# Patient Record
Sex: Female | Born: 1960
Health system: Southern US, Community
[De-identification: ages and names within clinical notes are randomized; demographics above are authoritative.]

## PROBLEM LIST (undated history)

## (undated) ENCOUNTER — Emergency Department (HOSPITAL_COMMUNITY): Discharge status: Against Medical Advice | Payer: Self-pay

## (undated) DIAGNOSIS — R110 Nausea: Secondary | ICD-10-CM

## (undated) DIAGNOSIS — E876 Hypokalemia: Secondary | ICD-10-CM

## (undated) DIAGNOSIS — R1310 Dysphagia, unspecified: Secondary | ICD-10-CM

## (undated) DIAGNOSIS — M545 Low back pain, unspecified: Secondary | ICD-10-CM

## (undated) DIAGNOSIS — I351 Nonrheumatic aortic (valve) insufficiency: Secondary | ICD-10-CM

## (undated) DIAGNOSIS — I509 Heart failure, unspecified: Secondary | ICD-10-CM

## (undated) DIAGNOSIS — G4700 Insomnia, unspecified: Secondary | ICD-10-CM

## (undated) DIAGNOSIS — Z9851 Tubal ligation status: Secondary | ICD-10-CM

## (undated) DIAGNOSIS — T7411XA Adult physical abuse, confirmed, initial encounter: Secondary | ICD-10-CM

## (undated) DIAGNOSIS — R0602 Shortness of breath: Secondary | ICD-10-CM

## (undated) DIAGNOSIS — N921 Excessive and frequent menstruation with irregular cycle: Secondary | ICD-10-CM

## (undated) DIAGNOSIS — K5900 Constipation, unspecified: Secondary | ICD-10-CM

## (undated) DIAGNOSIS — J101 Influenza due to other identified influenza virus with other respiratory manifestations: Secondary | ICD-10-CM

## (undated) DIAGNOSIS — M542 Cervicalgia: Secondary | ICD-10-CM

## (undated) DIAGNOSIS — R079 Chest pain, unspecified: Secondary | ICD-10-CM

## (undated) DIAGNOSIS — D219 Benign neoplasm of connective and other soft tissue, unspecified: Secondary | ICD-10-CM

## (undated) DIAGNOSIS — M2540 Effusion, unspecified joint: Secondary | ICD-10-CM

## (undated) DIAGNOSIS — N393 Stress incontinence (female) (male): Secondary | ICD-10-CM

## (undated) DIAGNOSIS — R42 Dizziness and giddiness: Secondary | ICD-10-CM

## (undated) DIAGNOSIS — G971 Other reaction to spinal and lumbar puncture: Secondary | ICD-10-CM

## (undated) DIAGNOSIS — G43109 Migraine with aura, not intractable, without status migrainosus: Secondary | ICD-10-CM

## (undated) DIAGNOSIS — F32A Depression, unspecified: Secondary | ICD-10-CM

## (undated) DIAGNOSIS — F419 Anxiety disorder, unspecified: Secondary | ICD-10-CM

## (undated) DIAGNOSIS — R920 Mammographic microcalcification found on diagnostic imaging of breast: Secondary | ICD-10-CM

## (undated) DIAGNOSIS — D649 Anemia, unspecified: Secondary | ICD-10-CM

## (undated) DIAGNOSIS — R531 Weakness: Secondary | ICD-10-CM

## (undated) DIAGNOSIS — M4802 Spinal stenosis, cervical region: Secondary | ICD-10-CM

## (undated) DIAGNOSIS — R600 Localized edema: Secondary | ICD-10-CM

## (undated) DIAGNOSIS — L52 Erythema nodosum: Secondary | ICD-10-CM

## (undated) DIAGNOSIS — N920 Excessive and frequent menstruation with regular cycle: Secondary | ICD-10-CM

## (undated) DIAGNOSIS — G5600 Carpal tunnel syndrome, unspecified upper limb: Secondary | ICD-10-CM

## (undated) DIAGNOSIS — F329 Major depressive disorder, single episode, unspecified: Secondary | ICD-10-CM

## (undated) DIAGNOSIS — J45909 Unspecified asthma, uncomplicated: Secondary | ICD-10-CM

## (undated) DIAGNOSIS — K649 Unspecified hemorrhoids: Secondary | ICD-10-CM

## (undated) DIAGNOSIS — I10 Essential (primary) hypertension: Secondary | ICD-10-CM

## (undated) HISTORY — DX: Excessive and frequent menstruation with regular cycle: N92.0

## (undated) HISTORY — DX: Tubal ligation status: Z98.51

## (undated) HISTORY — PX: MYOMECTOMY: SHX85

## (undated) HISTORY — DX: Benign neoplasm of connective and other soft tissue, unspecified: D21.9

## (undated) HISTORY — DX: Hypokalemia: E87.6

## (undated) HISTORY — DX: Carpal tunnel syndrome, unspecified upper limb: G56.00

## (undated) HISTORY — DX: Anemia, unspecified: D64.9

## (undated) HISTORY — DX: Adult physical abuse, confirmed, initial encounter: T74.11XA

## (undated) HISTORY — DX: Influenza due to other identified influenza virus with other respiratory manifestations: J10.1

## (undated) HISTORY — DX: Erythema nodosum: L52

## (undated) HISTORY — DX: Excessive and frequent menstruation with irregular cycle: N92.1

## (undated) HISTORY — DX: Spinal stenosis, cervical region: M48.02

## (undated) HISTORY — DX: Migraine with aura, not intractable, without status migrainosus: G43.109

## (undated) HISTORY — PX: LASER ABLATION/CAUTERIZATION OF ENDOMETRIAL IMPLANTS: SHX1951

## (undated) HISTORY — PX: TUBAL LIGATION: SHX77

## (undated) HISTORY — DX: Mammographic microcalcification found on diagnostic imaging of breast: R92.0

---

## 2011-03-13 ENCOUNTER — Emergency Department (HOSPITAL_COMMUNITY)
Admission: EM | Admit: 2011-03-13 | Discharge: 2011-03-14 | Disposition: A | Discharge status: Home / Self Care | Payer: No Typology Code available for payment source | Attending: Emergency Medicine | Admitting: Emergency Medicine

## 2011-03-13 DIAGNOSIS — I10 Essential (primary) hypertension: Secondary | ICD-10-CM | POA: Insufficient documentation

## 2011-03-13 DIAGNOSIS — Z79899 Other long term (current) drug therapy: Secondary | ICD-10-CM | POA: Insufficient documentation

## 2011-03-13 DIAGNOSIS — S239XXA Sprain of unspecified parts of thorax, initial encounter: Secondary | ICD-10-CM | POA: Insufficient documentation

## 2011-03-13 DIAGNOSIS — M5380 Other specified dorsopathies, site unspecified: Secondary | ICD-10-CM | POA: Insufficient documentation

## 2011-03-13 DIAGNOSIS — M546 Pain in thoracic spine: Secondary | ICD-10-CM | POA: Insufficient documentation

## 2011-09-02 ENCOUNTER — Emergency Department (HOSPITAL_COMMUNITY)
Admission: EM | Admit: 2011-09-02 | Discharge: 2011-09-02 | Disposition: A | Discharge status: Home / Self Care | Payer: Self-pay | Attending: Emergency Medicine | Admitting: Emergency Medicine

## 2011-09-02 ENCOUNTER — Emergency Department (HOSPITAL_COMMUNITY): Payer: Self-pay

## 2011-09-02 DIAGNOSIS — I10 Essential (primary) hypertension: Secondary | ICD-10-CM | POA: Insufficient documentation

## 2011-09-02 DIAGNOSIS — R059 Cough, unspecified: Secondary | ICD-10-CM | POA: Insufficient documentation

## 2011-09-02 DIAGNOSIS — J40 Bronchitis, not specified as acute or chronic: Secondary | ICD-10-CM | POA: Insufficient documentation

## 2011-09-02 DIAGNOSIS — R071 Chest pain on breathing: Secondary | ICD-10-CM | POA: Insufficient documentation

## 2011-09-02 DIAGNOSIS — R05 Cough: Secondary | ICD-10-CM | POA: Insufficient documentation

## 2011-09-02 LAB — COMPREHENSIVE METABOLIC PANEL
AST: 20 U/L (ref 0–37)
Albumin: 3.6 g/dL (ref 3.5–5.2)
Calcium: 8.9 mg/dL (ref 8.4–10.5)
Chloride: 102 mEq/L (ref 96–112)
Creatinine, Ser: 0.84 mg/dL (ref 0.50–1.10)
Total Bilirubin: 0.2 mg/dL — ABNORMAL LOW (ref 0.3–1.2)
Total Protein: 7.3 g/dL (ref 6.0–8.3)

## 2011-09-02 LAB — CBC
MCV: 86.7 fL (ref 78.0–100.0)
Platelets: 256 10*3/uL (ref 150–400)
RDW: 12.8 % (ref 11.5–15.5)
WBC: 3.5 10*3/uL — ABNORMAL LOW (ref 4.0–10.5)

## 2011-10-16 ENCOUNTER — Emergency Department (HOSPITAL_COMMUNITY)
Admission: EM | Admit: 2011-10-16 | Discharge: 2011-10-16 | Disposition: A | Discharge status: Home / Self Care | Payer: Self-pay | Attending: Emergency Medicine | Admitting: Emergency Medicine

## 2011-10-16 DIAGNOSIS — I10 Essential (primary) hypertension: Secondary | ICD-10-CM | POA: Insufficient documentation

## 2011-10-16 DIAGNOSIS — L02419 Cutaneous abscess of limb, unspecified: Secondary | ICD-10-CM | POA: Insufficient documentation

## 2011-12-19 DIAGNOSIS — R55 Syncope and collapse: Secondary | ICD-10-CM

## 2011-12-19 DIAGNOSIS — R079 Chest pain, unspecified: Secondary | ICD-10-CM

## 2011-12-19 HISTORY — DX: Chest pain, unspecified: R07.9

## 2011-12-19 HISTORY — DX: Syncope and collapse: R55

## 2011-12-22 ENCOUNTER — Emergency Department (INDEPENDENT_AMBULATORY_CARE_PROVIDER_SITE_OTHER)
Admission: EM | Admit: 2011-12-22 | Discharge: 2011-12-22 | Disposition: A | Discharge status: Home / Self Care | Payer: Self-pay | Source: Home / Self Care | Attending: Family Medicine | Admitting: Family Medicine

## 2011-12-22 DIAGNOSIS — N390 Urinary tract infection, site not specified: Secondary | ICD-10-CM

## 2011-12-22 DIAGNOSIS — A499 Bacterial infection, unspecified: Secondary | ICD-10-CM

## 2011-12-22 DIAGNOSIS — N760 Acute vaginitis: Secondary | ICD-10-CM

## 2011-12-22 DIAGNOSIS — B9689 Other specified bacterial agents as the cause of diseases classified elsewhere: Secondary | ICD-10-CM

## 2011-12-22 HISTORY — DX: Essential (primary) hypertension: I10

## 2011-12-22 LAB — WET PREP, GENITAL
Clue Cells Wet Prep HPF POC: NONE SEEN
Yeast Wet Prep HPF POC: NONE SEEN

## 2011-12-22 LAB — POCT PREGNANCY, URINE: Preg Test, Ur: NEGATIVE

## 2011-12-22 LAB — POCT URINALYSIS DIP (DEVICE)
Bilirubin Urine: NEGATIVE
Ketones, ur: NEGATIVE mg/dL
Specific Gravity, Urine: >=1.03 (ref 1.005–1.030)
pH: 6 (ref 5.0–8.0)

## 2011-12-22 MED ORDER — FLUCONAZOLE 150 MG PO TABS: 150.0000 mg | ORAL_TABLET | Freq: Once | ORAL | Status: AC

## 2011-12-22 MED ORDER — METRONIDAZOLE 500 MG PO TABS: 500.0000 mg | ORAL_TABLET | Freq: Two times a day (BID) | ORAL | Status: AC

## 2011-12-22 MED ORDER — LISINOPRIL 20 MG PO TABS: 20.0000 mg | ORAL_TABLET | Freq: Every day | ORAL | Status: DC

## 2011-12-22 MED ORDER — CEPHALEXIN 500 MG PO CAPS: 500.0000 mg | ORAL_CAPSULE | Freq: Two times a day (BID) | ORAL | Status: AC

## 2011-12-22 NOTE — ED Notes (Signed)
Reports she has missed a period and is concerned about poss pregnancy, as well as poss STD's ; reports UA incontinance, UA odor, frquency; had laser surgery for fibroids

## 2011-12-22 NOTE — ED Provider Notes (Signed)
History     CSN: [id number]  Arrival date & time 12/22/11  1733   First MD Initiated Contact with Patient 12/22/11 1806      Chief Complaint  Patient presents with  . Urinary Tract Infection    (Consider location/radiation/quality/duration/timing/severity/associated sxs/prior treatment) HPI Comments: Concerned about missed period, questions about menopause, reports vaginal discharge, malodorous, dysuria, frequency, urgency, but small amount that comes out when she urinates. She has a new partner, but found out previous partner was unfaithful and now requests STD screening, including HIV and RPR Patient is a 51 y.o. female presenting with female genitourinary complaint. The history is provided by the patient.  Female GU Problem Primary symptoms include dysuria.  Primary symptoms include no discharge, no dyspareunia, no genital lesions, no genital rash, no genital itching, no genital odor and no vaginal bleeding. There has been no fever. She has missed her period. Her LMP was weeks ago. Associated symptoms include frequency.    Past Medical History  Diagnosis Date  . Hypertension     History reviewed. No pertinent past surgical history.  History reviewed. No pertinent family history.  History  Substance Use Topics  . Smoking status: Not on file  . Smokeless tobacco: Not on file  . Alcohol Use:     OB History    Grav Para Term Preterm Abortions TAB SAB Ect Mult Living                  Review of Systems  Constitutional: Negative.   HENT: Negative.   Eyes: Negative.   Respiratory: Negative.   Cardiovascular: Negative.   Gastrointestinal: Negative.   Genitourinary: Positive for dysuria, urgency and frequency. Negative for vaginal bleeding, vaginal discharge and dyspareunia.  Musculoskeletal: Negative.   Skin: Negative.   Neurological: Negative.     Allergies  Review of patient's allergies indicates no known allergies.  Home Medications   Current Outpatient Rx    Name Route Sig Dispense Refill  . LISINOPRIL 10 MG PO TABS Oral Take 10 mg by mouth daily.      . CEPHALEXIN 500 MG PO CAPS Oral Take 1 capsule (500 mg total) by mouth 2 (two) times daily. 14 capsule 0  . LISINOPRIL 20 MG PO TABS Oral Take 1 tablet (20 mg total) by mouth daily. 60 tablet 0  . METRONIDAZOLE 500 MG PO TABS Oral Take 1 tablet (500 mg total) by mouth 2 (two) times daily. 14 tablet 0    BP 162/92  Pulse 92  Temp(Src) 98.1 F (36.7 C) (Oral)  Resp 16  SpO2 100%  Physical Exam  Nursing note and vitals reviewed. Constitutional: She is oriented to person, place, and time. She appears well-developed and well-nourished.  HENT:  Head: Normocephalic and atraumatic.  Eyes: EOM are normal.  Neck: Normal range of motion.  Pulmonary/Chest: Effort normal.  Genitourinary: Pelvic exam was performed with patient supine. Cervix exhibits no discharge. No bleeding around the vagina. Vaginal discharge found.  Musculoskeletal: Normal range of motion.  Neurological: She is alert and oriented to person, place, and time.  Skin: Skin is warm and dry.  Psychiatric: Her behavior is normal.    ED Course  Procedures (including critical care time)  Labs Reviewed  POCT URINALYSIS DIP (DEVICE) - Abnormal; Notable for the following:    Glucose, UA 100 (*)    Hgb urine dipstick MODERATE (*)    Protein, ur 100 (*)    Nitrite POSITIVE (*)    Leukocytes, UA LARGE (*) Biochemical  Testing Only. Please order routine urinalysis from main lab if confirmatory testing is needed.   All other components within normal limits  WET PREP, GENITAL - Abnormal; Notable for the following:    Trich, Wet Prep FEW (*)    WBC, Wet Prep HPF POC TOO NUMEROUS TO COUNT (*)    All other components within normal limits  GC/CHLAMYDIA PROBE AMP, GENITAL  POCT PREGNANCY, URINE  HIV ANTIBODY (ROUTINE TESTING)  RPR  LAB REPORT - SCANNED   No results found.   1. UTI (lower urinary tract infection)   2. Bacterial  vaginosis       MDM  Labs reviewed; wet prep, HIV, RPR pending        Richardo Priest, MD 12/25/11 1305

## 2011-12-25 ENCOUNTER — Telehealth (HOSPITAL_COMMUNITY): Payer: Self-pay | Admitting: *Deleted

## 2011-12-25 NOTE — ED Notes (Signed)
GC neg., Chlamydia neg.  Wet prep: few trich, WBC's TNTC. Pt. adequately treated with Flagyl. I called and phone number not working. I left message with pt's contact ( mother) for pt. to call. Maria Griffith 12/25/2011

## 2011-12-26 NOTE — ED Notes (Signed)
Pt. came in to view her HIV result and wanted copies of her labs. Pt.'s ID and labs copied while she signed the medical release form. I showed pt. her neg. HIV result and reviewed her other results.  Pt. Instructed to get HIV test repeated in 6 mos. Pt.'s questions answered. Pt. given her results in a brown envelope. Vassie Moselle 12/26/2011

## 2012-07-03 ENCOUNTER — Encounter: Payer: Self-pay | Admitting: Family Medicine

## 2012-07-03 ENCOUNTER — Ambulatory Visit (INDEPENDENT_AMBULATORY_CARE_PROVIDER_SITE_OTHER): Payer: Self-pay | Admitting: Family Medicine

## 2012-07-03 VITALS — BP 190/116 | HR 83 | Ht 62.5 in | Wt 160.0 lb

## 2012-07-03 DIAGNOSIS — L0390 Cellulitis, unspecified: Secondary | ICD-10-CM

## 2012-07-03 DIAGNOSIS — R519 Headache, unspecified: Secondary | ICD-10-CM

## 2012-07-03 DIAGNOSIS — G8929 Other chronic pain: Secondary | ICD-10-CM

## 2012-07-03 DIAGNOSIS — I10 Essential (primary) hypertension: Secondary | ICD-10-CM

## 2012-07-03 DIAGNOSIS — L0291 Cutaneous abscess, unspecified: Secondary | ICD-10-CM

## 2012-07-03 DIAGNOSIS — R51 Headache: Secondary | ICD-10-CM

## 2012-07-03 HISTORY — DX: Other chronic pain: G89.29

## 2012-07-03 HISTORY — DX: Headache, unspecified: R51.9

## 2012-07-03 MED ORDER — BENAZEPRIL-HYDROCHLOROTHIAZIDE 20-12.5 MG PO TABS: 1.0000 | ORAL_TABLET | Freq: Every day | ORAL | Status: DC

## 2012-07-03 NOTE — Assessment & Plan Note (Signed)
Patient's most serious complaint this visit was her HTN that was currently untreated and was measured in the office at 190s/110s.  - Start back on ACE-Thiazide combination which it sounds like patient had previously been on.  Benazepril-HCTZ (Lotensin HCT) 20-12.5 mg po daily - This will probably also help with patient's headaches - Check BMET (Cr at baseline) and CBC today to get a baseline and recheck BMET at follow-up - Return for follow-up in 2 weeks

## 2012-07-03 NOTE — Assessment & Plan Note (Deleted)
Patient is somewhat concerned about cellulitis.  On exam, there is little evidence of cellulitis with just a small 3 cm radius slightly more erythematous area that is not very tender. - Watch and f/u at next visit

## 2012-07-03 NOTE — Assessment & Plan Note (Signed)
Given symptoms and chronicity, this sounds like migraine headache but may also be related to uncontrolled hypertension. - Started ace-HCTZ combination therapy today, and following-up in 2 weeks - If not resolving by next visit, consider triptan

## 2012-07-03 NOTE — Patient Instructions (Addendum)
It was good to meet you today!  The most important issue today was your very elevated blood pressure.   - Please restart the new blood pressure medication, lotensin HCT, which is a combination of benazepril 20 mg and hydrochlorothiazide 12.5 mg.  Take this medication daily.  - If you feel lightheaded or dizzy, please check your blood pressure and let me know at our next visit what it was. - Please set up a follow-up appointment with me in about 2 weeks to check on your blood pressure after restarting your medications. - We discussed your headaches today, which are likely caused in part by your high blood pressure and in part by needing to visit an eye doctor to get glasses.  Hopefully working on these things will help reduce your headaches. - Please have regular visits with a dentist. - I will call you if any lab values are abnormal, and other wise I will send you a letter with these values.  We will discuss some of your other medical complaints when you return.  The most important issue today was your very elevated blood pressure.  Thank you!

## 2012-07-03 NOTE — Progress Notes (Signed)
Subjective:     Patient ID: Maria Griffith, female   DOB: 20-Nov-1961, 51 y.o.   MRN: [id number]  HPI Patient is a 51 year old female with a h/o HTN and cellulitis of the left leg who comes in for a new patient visit but with multiple complaints.  Patient moved to [place] 2 years ago and has not established care yet.  She complains of running out of HTN medication and headaches.  HTN - Pt ran out of her lisinopril "a while ago" and has not been taking it regularly since then. She has also not been taking her diuretic (she thinks it was HCTZ, prescribed in [place]) for about 2 years.  She denies any acute vision changes, chest pain, or shortness of breath.   HA - She has had years of intermittent severe pain with photophobia, phonophobia, nausea, vomiting and neck pain without stiffness.  She notes an aura of flashes of light prior to this headache.  The pain can be unilateral, bilateral, or at the crown.  It is only relieved by sleeping or tylenol 500 mg x 4.  Denies fevers, tearing up, sweating.    Review of Systems - Pt reports multiple other complaints.  Left leg pain/swelling - Patient is concerned for cellulitis and states she had this in [place] and had to be put on some antibiotic.  She has a small swollen area on her left leg now that is not draining, erythematous, or warm and she denies any fevers or pain at the site.  Stomach pain starting at the naval that feels like a cramping/pulling sensation into her back that comes and goes on own about once/week for the past few months, sometimes causes nausea/vomiting, somewhat relieved with a bowel movement; denies changes with eating or changes in bowel function or consistency.  Symptoms of depressed mood that last a few days at a time (anhedonia, increased sleep, decreased energy, decreased appetite, some decreased concentration, denies SI/HI).    Gyn:  - Patient's most recent pap was normal but had a previous one that had been abnormal - H/o STD (pt  thinks trichomonas but unsure - pt and partner were treated).  Objective:   Physical Exam BP 190/116  Pulse 83  Ht 5' 2.5" (1.588 m)  Wt 160 lb (72.576 kg)  BMI 28.80 kg/m2  LMP 06/30/2012 Gen: Well-groomed female sitting on exam table in NAD CV: RRR, no murmurs, rubs, or gallops Pulm: CTAB, no wheezes, rales, ronchi, or increased work of breathing Abd; soft, non-tender, non-distended, NABS, no organomegaly Extremities: Left leg with small slightly erythematous raised 3 cm area on shin, not tender to palpation HEENT: PERRL, EOMI, MMM, poor dentition Neuro: CN II-XII grossly in tact, pt alert and oriented x 3     Assessment:   Patient is a 51 year old with h/o HTN and cellulitis of left leg here to establish care and with multiple complaints.    Plan:     1. HTN is the most serious issue today and patient will need to return to evaluate other issues. (see A/P under problem list) 2. Headaches - discuss at f/u visit (see A/P under problem list) 3. Concern for left leg cellulitis - discuss at future visit 4. Abdominal pain - discuss at future visit 5. Dysthymia - discuss at future visit 6. Health maintenance - pt needs colonoscopy, ?mammogram, female physical exam with pap smear, Fasting lipid panel, A1c check 7. Poor dentition - recommended dental appointment 8. Occasional urinary incontinence since  laser fibroid surgery - f/u at future visit

## 2012-07-04 LAB — CBC WITH DIFFERENTIAL/PLATELET
Basophils Absolute: 0 10*3/uL (ref 0.0–0.1)
Eosinophils Absolute: 0.3 10*3/uL (ref 0.0–0.7)
Eosinophils Relative: 10 % — ABNORMAL HIGH (ref 0–5)
HCT: 38.5 % (ref 36.0–46.0)
MCH: 29.5 pg (ref 26.0–34.0)
MCV: 88.7 fL (ref 78.0–100.0)
Monocytes Absolute: 0.3 10*3/uL (ref 0.1–1.0)
Platelets: 290 10*3/uL (ref 150–400)
RDW: 14.4 % (ref 11.5–15.5)

## 2012-07-04 LAB — BASIC METABOLIC PANEL
BUN: 19 mg/dL (ref 6–23)
CO2: 28 mEq/L (ref 19–32)
Calcium: 9.2 mg/dL (ref 8.4–10.5)
Creat: 0.91 mg/dL (ref 0.50–1.10)

## 2012-07-12 ENCOUNTER — Ambulatory Visit (INDEPENDENT_AMBULATORY_CARE_PROVIDER_SITE_OTHER): Payer: Self-pay | Admitting: Family Medicine

## 2012-07-12 ENCOUNTER — Encounter: Payer: Self-pay | Admitting: Family Medicine

## 2012-07-12 ENCOUNTER — Telehealth: Payer: Self-pay | Admitting: Family Medicine

## 2012-07-12 VITALS — BP 188/112 | HR 67 | Ht 62.5 in | Wt 157.8 lb

## 2012-07-12 DIAGNOSIS — I10 Essential (primary) hypertension: Secondary | ICD-10-CM

## 2012-07-12 DIAGNOSIS — R51 Headache: Secondary | ICD-10-CM

## 2012-07-12 MED ORDER — BENAZEPRIL-HYDROCHLOROTHIAZIDE 20-12.5 MG PO TABS: 1.0000 | ORAL_TABLET | Freq: Every day | ORAL | Status: DC

## 2012-07-12 MED ORDER — KETOROLAC TROMETHAMINE 60 MG/2ML IM SOLN
60.0000 mg | Freq: Once | INTRAMUSCULAR | Status: AC
  Administered 2012-07-12: 60 mg via INTRAMUSCULAR

## 2012-07-12 MED ORDER — ACETAMINOPHEN 500 MG PO TABS: 500.0000 mg | ORAL_TABLET | Freq: Four times a day (QID) | ORAL | Status: AC | PRN

## 2012-07-12 MED ORDER — AMLODIPINE BESYLATE 5 MG PO TABS: 5.0000 mg | ORAL_TABLET | Freq: Every day | ORAL | Status: DC

## 2012-07-12 MED ORDER — PROMETHAZINE HCL 25 MG/ML IJ SOLN
25.0000 mg | Freq: Once | INTRAMUSCULAR | Status: AC
  Administered 2012-07-12: 25 mg via INTRAMUSCULAR

## 2012-07-12 MED ORDER — PROMETHAZINE HCL 25 MG PO TABS: 25.0000 mg | ORAL_TABLET | Freq: Three times a day (TID) | ORAL | Status: DC | PRN

## 2012-07-12 MED ORDER — SUMATRIPTAN SUCCINATE 50 MG PO TABS: 50.0000 mg | ORAL_TABLET | ORAL | Status: DC | PRN

## 2012-07-12 NOTE — Patient Instructions (Addendum)
I am sorry that you are feeling so badly.  I am starting you on a blood pressure medication called norvasc which should help with the better control of your blood pressure. It has also been shown to help prevent migraine headaches.  Please follow up at the beginning of next week with either Dr. Karie Schwalbe or another doctor, to make sure that your blood pressure is better and that your headache has improved.   Continue taking tylenol 500mg  every 6 hours but no more than 300mg  in 24 hours. I also prescribing phenergan for nausea. It will make you drowsy so take it when you're at home. You were given a shot of toradol for pain and a shot of phenergan for nausea.   Go to the emergency department if you have any weakness on one side, any slurred speech, any headache that is worst than anything you have had before and doesn't go away with pain medication.     Migraine Headache A migraine headache is an intense, throbbing pain on one or both sides of your head. The exact cause of a migraine headache is not always known. A migraine may be caused when nerves in the brain become irritated and release chemicals that cause swelling within blood vessels, causing pain. Many migraine sufferers have a family history of migraines. Before you get a migraine you may or may not get an aura. An aura is a group of symptoms that can predict the beginning of a migraine. An aura may include:  Visual changes such as:   Flashing lights.   Bright spots or zig-zag lines.   Tunnel vision.   Feelings of numbness.   Trouble talking.   Muscle weakness.  SYMPTOMS  Pain on one or both sides of your head.   Pain that is pulsating or throbbing in nature.   Pain that is severe enough to prevent daily activities.   Pain that is aggravated by any daily physical activity.   Nausea (feeling sick to your stomach), vomiting, or both.   Pain with exposure to bright lights, loud noises, or activity.   General sensitivity to bright  lights or loud noises.  MIGRAINE TRIGGERS Examples of triggers of migraine headaches include:   Alcohol.   Smoking.   Stress.   It may be related to menses (female menstruation).   Aged cheeses.   Foods or drinks that contain nitrates, glutamate, aspartame, or tyramine.   Lack of sleep.   Chocolate.   Caffeine.   Hunger.   Medications such as nitroglycerine (used to treat chest pain), birth control pills, estrogen, and some blood pressure medications.  DIAGNOSIS  A migraine headache is often diagnosed based on:  Symptoms.   Physical examination.   A computerized X-ray scan (computed tomography, CT) of your head.  TREATMENT  Medications can help prevent migraines if they are recurrent or should they become recurrent. Your caregiver can help you with a medication or treatment program that will be helpful to you.   Lying down in a dark, quiet room may be helpful.   Keeping a headache diary may help you find a trend as to what may be triggering your headaches.  SEEK IMMEDIATE MEDICAL CARE IF:   You have confusion, personality changes or seizures.   You have headaches that wake you from sleep.   You have an increased frequency in your headaches.   You have a stiff neck.   You have a loss of vision.   You have muscle weakness.  You start losing your balance or have trouble walking.   You feel faint or pass out.  MAKE SURE YOU:   Understand these instructions.   Will watch your condition.   Will get help right away if you are not doing well or get worse.  Document Released: 12/04/2005 Document Revised: 11/23/2011 Document Reviewed: 07/20/2009 [facility] Patient Information 2012 [place], [place].

## 2012-07-12 NOTE — Telephone Encounter (Signed)
Contacted [facility] and was told that  they do not have benazepril/HCTZ . They gave patient the benazepril  and advised her to go to [facility] to get HCTZ.  Spoke with Diane at [facility] pharmacy and she states they have a plan with [facility] to help patients get meds.  She advised patient to go to [facility] to get HCTZ . [facility] can get HCTZ free of charge. Maurice March is now calling wanting an Rx ok for the the HCTZ. Paged Dr. Gwenlyn Saran and she advises OK to give Rx for HCTZ 12.5 mg .    # 30 with 3 refills.  John at [facility]  and RX verbally given .

## 2012-07-12 NOTE — Telephone Encounter (Signed)
Please call the pharmacy at the number [phone] and ask for Maria Griffith.  There seem to be a problem with the rxs written for Ms. Friesenhahn's bp meds.

## 2012-07-12 NOTE — Assessment & Plan Note (Addendum)
Not controled on lisinopril/hctz 20/12.5. BP after toradol and phenergan was 158/92 which implies that pain may have been causing some elevation in blood pressure. Will add norvasc 5mg  daily. Warned patient about possibility of leg swelling. Patient to follow up early next week for BP recheck. If BP not controled on current medications, patient may benefit from 24hr BP monitoring with Dr. Raymondo Band.

## 2012-07-12 NOTE — Progress Notes (Signed)
Patient ID: Maria Griffith, female   DOB: September 03, 1961, 51 y.o.   MRN: [id number] Patient ID: Maria Griffith    DOB: 19-Jul-1961, 51 y.o.   MRN: [id number] --- Subjective:  Maria Griffith is a 51 y.o.female with h/o HTN who present with elevated blood pressure and headache.  - headache:  Onset:  Yesterday although has been a recurrent problem for over 1 year. Nature and location: pounding, right occiput and travels to right frontal aspect Severity: 8/10 Course of symptoms over time: intermittent since yesterday.  Aggravating: Alleviating: sleeping and tylenol help (took 4-5x500mg  tylenol) Associated sx/sn: no weakness, no nausea, no vomiting, + photophobia, +phonophobia, no neck pain or rigidity. No worst headache compared to what she has experienced in the past. Some dizziness especially from sitting to standing.  - Hypertension: BP at last visit was 190/116, started on lisinopril/HCTZ 20-12.5mg . Had previously been on lisinopril 20mg .  Denies any chest pain, any shortness of breath, any swelling in the lower extremities.  Has had hypertension for a few years now and is not sure how well controled she was in the past.   ROS: see HPI Past Medical History: reviewed and updated medications and allergies. Social History: Tobacco: former smoker  Objective: Filed Vitals:   07/12/12 1057  BP: 188/112  Pulse: 67   Repeat BP: 194/114 BP taken in both arms  Physical Examination:   General appearance - alert, appears in distress from headache and from light Nose - normal and patent, erythematous and congested nasal turbinates Mouth - mucous membranes moist, pharynx normal without lesions Neck - supple, no significant adenopathy Chest - clear to auscultation, no wheezes, rales or rhonchi, symmetric air entry Heart - normal rate, regular rhythm, soft 1/6 systolic murmur best heard at right sternal border at second intercostal space, no rubs, clicks or gallops Abdomen - soft, nontender,  nondistended, no masses or organomegaly Extremities - peripheral pulses normal, no pedal edema, no clubbing or cyanosis Neuro - CN2-12 grossly intact, 5/5 strength in upper and lower extremities bilaterally, sensation to light touch intact, fundal exam done but disc difficult to visualize. Head - tenderness to palpation along right frontal aspect and right occiput  Labs: CMP     Component Value Date/Time   NA 138 07/03/2012 1641   K 4.0 07/03/2012 1641   CL 104 07/03/2012 1641   CO2 28 07/03/2012 1641   GLUCOSE 77 07/03/2012 1641   BUN 19 07/03/2012 1641   CREATININE 0.91 07/03/2012 1641   CREATININE 0.84 09/02/2011 2104   CALCIUM 9.2 07/03/2012 1641   PROT 7.3 09/02/2011 2104   ALBUMIN 3.6 09/02/2011 2104   AST 20 09/02/2011 2104   ALT 12 09/02/2011 2104   ALKPHOS 42 09/02/2011 2104   BILITOT 0.2* 09/02/2011 2104   GFRNONAA >60 09/02/2011 2104   GFRAA >60 09/02/2011 2104

## 2012-07-12 NOTE — Assessment & Plan Note (Addendum)
Likely from migraine headache. No evidence of intracranial bleed, infection or stroke given history and normal exam. Headache could also be exacerbated by elevated blood pressure. toradol 60mg  and phenergan 25mg  in office helped with headache. With elevated BP, will not prescribe tryptan for now. Discussed plan with Dr. Deirdre Priest.

## 2012-07-19 ENCOUNTER — Ambulatory Visit: Payer: Self-pay | Admitting: Family Medicine

## 2012-07-19 ENCOUNTER — Encounter: Payer: Self-pay | Admitting: Family Medicine

## 2012-07-19 ENCOUNTER — Ambulatory Visit (INDEPENDENT_AMBULATORY_CARE_PROVIDER_SITE_OTHER): Payer: Self-pay | Admitting: Family Medicine

## 2012-07-19 VITALS — BP 122/84 | HR 60 | Ht 62.5 in | Wt 159.0 lb

## 2012-07-19 DIAGNOSIS — R51 Headache: Secondary | ICD-10-CM

## 2012-07-19 DIAGNOSIS — I10 Essential (primary) hypertension: Secondary | ICD-10-CM

## 2012-07-19 MED ORDER — METOPROLOL SUCCINATE ER 50 MG PO TB24: 50.0000 mg | ORAL_TABLET | Freq: Every day | ORAL | Status: DC

## 2012-07-19 NOTE — Assessment & Plan Note (Signed)
Pt returns with headache, improved since last visit likely due to better BP control, but still bothering patient. - Likely migraine headache; started beta-blocker metoprolol today - f/u at next visit to see if improving - hesitate to use triptan as very expensive and until CP resolves, though likely non-cardiac etiology

## 2012-07-19 NOTE — Assessment & Plan Note (Signed)
HTN much better managed with HCTZ, benazepril, and norvasc.  However, patient having nausea requiring daily phenergan with one of the medications, likely norvasc. - Stop norvasc today given nausea; add metoprolol 50mg  daily as beta-blocker can also help prevent migraine, which patient likely suffers from - Pt returning Monday morning for a lab draw - fasting lipid panel for physical visit; also ordered BMET for that lab draw to f/u on pt's Cr - Recommended exercise and low-sodium diet - Pt's chest pain likely from chemical exposure at work a few weeks ago and is resolving; recommended using a mask when exposed to chemicals at work - f/u at next visit

## 2012-07-19 NOTE — Patient Instructions (Addendum)
Good to see you today.  Glad your BP is improving. - Please stop taking the amlodipine and start taking metoprolol daily.  Hopefully this will also help your migraines. - Please come back to get a FASTING lipid panel and some other labs when you can - do it before having anything to eat or drink. - Please come back before Aug 15 for a full physical exam, with me if you can.   Thank you and have a great day!

## 2012-07-19 NOTE — Progress Notes (Signed)
Subjective:     Patient ID: Maria Griffith, female   DOB: 06-Apr-1961, 51 y.o.   MRN: [id number]  HPI Pt is a 51 yo female with h/o HTN and chronic headache here for follow-up on BP and headache.  Pt's has been taking HCTZ 12.5mg  daily, benazepril 20 mg daily, and amlodipine 5 mg daily, with the amlodipine added at the last visit due to continued SBP in 180s.  Today, BP is much better controlled at 122/84.  Pt understands how to take medications, but reports some nausea requiring daily phenergan which started since she started taking antihypertensives.  She reports dizziness just once, after which she checked BP at a drug store and it was in 180/100s.  Of note, she does not exercise, but also does not drink alcohol or use drugs or tobacco products.  Regarding her headache, pt reports they still feel like migraines (unilateral, relapsing/remitting about once/day, photo/phonophobia, nausea, visual aura (dots)) but the intensity is now a 3-4/10, which is improved from previously.  She denies focal neurologic symptoms.  Tylenol 1500 mg daily helps with the pain.  She has not had a vision exam yet.  This is a chronic problem.  Review of Systems  Denies fevers, chills, CP, or SOB.   Reports abdominal pain, dysthymia, occasional urine incontinence since laser fibroid surgery, some chest pain since inhaling chemical at work prior to last two visits, which is improving.  Reviewed medications and allergies -NKDA.    Objective:   Physical Exam  BP 122/84  Pulse 60  Ht 5' 2.5" (1.588 m)  Wt 159 lb (72.122 kg)  BMI 28.62 kg/m2  LMP 06/30/2012 Gen: NAD CV: RRR, no murmurs, rubs, gallops Pulm: CTAB, no wheezes, rales, ronchi, no increased WOB Abd: soft, non-distended, mild LLQ TTP, obese Neuro: CN 2-12 tested and in tact   Assessment:     Pt is a 51 yo female with h/o HTN and chronic headache here for follow-up on BP and headache.    Plan:     See problem list.  Health maintenance - Pt has  obtained [employer].  F/u in 1-2 weeks for full physical (pt requesting prior to beginning work Aug 15). - f/u BP - f/u nausea - f/u headache - schedule colonoscopy, mammogram - Check fasting lipid panel; glucose at last visit wnl - pap smear - Recommended patient make dental and optometrist appointments

## 2012-07-25 ENCOUNTER — Other Ambulatory Visit: Payer: Self-pay

## 2012-07-26 ENCOUNTER — Other Ambulatory Visit: Payer: Self-pay

## 2012-07-26 ENCOUNTER — Ambulatory Visit (INDEPENDENT_AMBULATORY_CARE_PROVIDER_SITE_OTHER): Payer: Self-pay | Admitting: *Deleted

## 2012-07-26 VITALS — BP 150/94 | HR 64

## 2012-07-26 DIAGNOSIS — I10 Essential (primary) hypertension: Secondary | ICD-10-CM

## 2012-07-26 LAB — BASIC METABOLIC PANEL
BUN: 21 mg/dL (ref 6–23)
Chloride: 103 mEq/L (ref 96–112)
Creat: 1.01 mg/dL (ref 0.50–1.10)

## 2012-07-26 LAB — LIPID PANEL
Cholesterol: 163 mg/dL (ref 0–200)
HDL: 44 mg/dL (ref >39)
LDL Cholesterol: 92 mg/dL (ref 0–99)
Triglycerides: 133 mg/dL (ref <150)

## 2012-07-26 NOTE — Progress Notes (Signed)
Patient in for labs and requested BP check. She has not taken meds this AM.  BP checked manually using regular adult cuff.    BP 150/94 bilaterally. Pulse 64. Will forward message to Dr. Benjamin Stain.       Patient signed ROI for previous doctor in [place] to have records faxed here. ROI faxed.

## 2012-07-26 NOTE — Progress Notes (Signed)
BMP AND FLP DONE TODAY Lorrinda Ramstad 

## 2012-07-29 ENCOUNTER — Encounter: Payer: Self-pay | Admitting: Family Medicine

## 2012-07-29 ENCOUNTER — Other Ambulatory Visit (HOSPITAL_COMMUNITY)
Admission: RE | Admit: 2012-07-29 | Discharge: 2012-07-29 | Disposition: A | Discharge status: Home / Self Care | Payer: Self-pay | Source: Ambulatory Visit | Attending: Family Medicine | Admitting: Family Medicine

## 2012-07-29 ENCOUNTER — Ambulatory Visit (INDEPENDENT_AMBULATORY_CARE_PROVIDER_SITE_OTHER): Payer: Self-pay | Admitting: Family Medicine

## 2012-07-29 ENCOUNTER — Other Ambulatory Visit: Payer: Self-pay | Admitting: Family Medicine

## 2012-07-29 VITALS — BP 131/82 | HR 72 | Temp 98.3°F | Ht 62.5 in | Wt 163.0 lb

## 2012-07-29 DIAGNOSIS — Z124 Encounter for screening for malignant neoplasm of cervix: Secondary | ICD-10-CM

## 2012-07-29 DIAGNOSIS — Z1231 Encounter for screening mammogram for malignant neoplasm of breast: Secondary | ICD-10-CM

## 2012-07-29 DIAGNOSIS — I10 Essential (primary) hypertension: Secondary | ICD-10-CM

## 2012-07-29 DIAGNOSIS — Z01419 Encounter for gynecological examination (general) (routine) without abnormal findings: Secondary | ICD-10-CM

## 2012-07-29 DIAGNOSIS — R51 Headache: Secondary | ICD-10-CM

## 2012-07-29 MED ORDER — QUINAPRIL-HYDROCHLOROTHIAZIDE 20-12.5 MG PO TABS: 1.0000 | ORAL_TABLET | Freq: Every day | ORAL | Status: DC

## 2012-07-29 NOTE — Assessment & Plan Note (Signed)
Pap done, genital and breast exam normal.  Patient given info to get mammogram done.

## 2012-07-29 NOTE — Assessment & Plan Note (Signed)
Improved with lower BP, less stress.  Advised to be careful with OTC medications due to rebound headaches.  F/U if the headaches worsen again.

## 2012-07-29 NOTE — Progress Notes (Signed)
  Subjective:    Patient ID: Maria Griffith, female    DOB: 06/25/1961, 51 y.o.   MRN: [id number]  HPI  Maria Griffith come in for her well woman exam and for follow up of her BP and headaches.   She says she has not had a pap or mammogram in about 3 years, but does not think she has ever had an abnormal pap or mammogram.  She has not noticed any lumps in her breasts.   HTN- Taking Benazepril/hctz, and toprol xl, no side effects.  BP has been better controlled.  However, she is getting her medications through the HD, and they have requested changing the benazepril/hctz to quinapril/hctz.    Headaches- have started improving with better BP control.  She also admits to being under a lot of stress when the headaches started and her BP was up.  Not taking anything but occasional OTC meds for headaches.   Past Medical History  Diagnosis Date  . Hypertension   . Cellulitis     of the left leg   Fam Hx: Negative for breast cancer  History  Substance Use Topics  . Smoking status: Former Smoker -- 1.0 packs/day for 7 years    Types: Cigarettes    Quit date: 07/03/1988  . Smokeless tobacco: Not on file  . Alcohol Use: No    Review of Systems See HPI    Objective:   Physical Exam BP 131/82  Pulse 72  Temp 98.3 F (36.8 C) (Oral)  Ht 5' 2.5" (1.588 m)  Wt 163 lb (73.936 kg)  BMI 29.34 kg/m2  LMP 07/15/2012 General appearance: alert, cooperative and no distress Eyes: conjunctivae/corneas clear. PERRL, EOM's intact. Fundi benign. Neck: no adenopathy, supple, symmetrical, trachea midline and thyroid not enlarged, symmetric, no tenderness/mass/nodules Lungs: clear to auscultation bilaterally Breasts: normal appearance, no masses or tenderness Heart: regular rate and rhythm, S1, S2 normal, no murmur, click, rub or gallop Pelvic: cervix normal in appearance, external genitalia normal, no adnexal masses or tenderness, no cervical motion tenderness, rectovaginal septum normal, uterus normal  size, shape, and consistency and vagina normal without discharge Extremities: extremities normal, atraumatic, no cyanosis or edema       Assessment & Plan:

## 2012-07-29 NOTE — Assessment & Plan Note (Addendum)
Improved control. Will change to accuretic per HD request. Pt advised to check BP when she has a chance and call if it is elevated or headaches worsen.

## 2012-07-29 NOTE — Patient Instructions (Signed)
It was nice to meet you.  I will send you a letter with the results of your pap.   Your blood pressure today was BP: 131/82 mmHg.  Remember your goal blood pressure is about 130/80.  Please be sure to take your medication every day, and let us know if your headaches worsen again.   Please be sure to have a mammogram done.

## 2012-07-29 NOTE — Progress Notes (Signed)
Dr. Benjamin Stain advises will follow up regarding BP at [facility].  Records were received and placed in MD box.

## 2012-07-30 ENCOUNTER — Ambulatory Visit
Admission: RE | Admit: 2012-07-30 | Discharge: 2012-07-30 | Disposition: A | Discharge status: Home / Self Care | Payer: Self-pay | Source: Ambulatory Visit | Attending: Emergency Medicine | Admitting: Emergency Medicine

## 2012-07-30 DIAGNOSIS — Z1231 Encounter for screening mammogram for malignant neoplasm of breast: Secondary | ICD-10-CM

## 2012-07-31 ENCOUNTER — Ambulatory Visit: Payer: Self-pay

## 2012-08-01 ENCOUNTER — Other Ambulatory Visit: Payer: Self-pay | Admitting: Family Medicine

## 2012-08-01 MED ORDER — QUINAPRIL-HYDROCHLOROTHIAZIDE 20-12.5 MG PO TABS: 1.0000 | ORAL_TABLET | Freq: Every day | ORAL | Status: DC

## 2012-08-01 MED ORDER — PROMETHAZINE HCL 25 MG PO TABS: 25.0000 mg | ORAL_TABLET | Freq: Three times a day (TID) | ORAL | Status: DC | PRN

## 2012-08-02 ENCOUNTER — Encounter: Payer: Self-pay | Admitting: Family Medicine

## 2012-08-14 ENCOUNTER — Telehealth: Payer: Self-pay | Admitting: Family Medicine

## 2012-08-14 NOTE — Telephone Encounter (Signed)
Spoke with patient today at 12:25 and she states  Headaches had improved some but now have returned. She has tried daytime tylenol she states and does not help. States when she takes tylenol PM helps headache. Suggested she try ibuprofen also. Offered appointment today but she will not be able to come today . Appointment scheduled for tomorrow AM. Advised if she decides to come today to call back and will work her in.

## 2012-08-14 NOTE — Telephone Encounter (Signed)
Pt is having another headache - bp meds are not helping - wants to talk to nurse   Or try to call her at [phone]

## 2012-08-15 ENCOUNTER — Encounter: Payer: Self-pay | Admitting: Family Medicine

## 2012-08-15 ENCOUNTER — Other Ambulatory Visit: Payer: Self-pay | Admitting: Emergency Medicine

## 2012-08-15 ENCOUNTER — Ambulatory Visit (INDEPENDENT_AMBULATORY_CARE_PROVIDER_SITE_OTHER): Payer: Self-pay | Admitting: Family Medicine

## 2012-08-15 VITALS — BP 182/102 | HR 64 | Temp 98.1°F | Ht 62.5 in | Wt 169.0 lb

## 2012-08-15 DIAGNOSIS — I10 Essential (primary) hypertension: Secondary | ICD-10-CM

## 2012-08-15 DIAGNOSIS — R51 Headache: Secondary | ICD-10-CM

## 2012-08-15 DIAGNOSIS — R920 Mammographic microcalcification found on diagnostic imaging of breast: Secondary | ICD-10-CM

## 2012-08-15 DIAGNOSIS — R928 Other abnormal and inconclusive findings on diagnostic imaging of breast: Secondary | ICD-10-CM

## 2012-08-15 HISTORY — DX: Mammographic microcalcification found on diagnostic imaging of breast: R92.0

## 2012-08-15 MED ORDER — PROMETHAZINE HCL 25 MG/ML IJ SOLN
12.5000 mg | Freq: Once | INTRAMUSCULAR | Status: AC
  Administered 2012-08-15: 12.5 mg via INTRAMUSCULAR

## 2012-08-15 MED ORDER — DIPHENHYDRAMINE HCL 50 MG/ML IJ SOLN
25.0000 mg | Freq: Once | INTRAMUSCULAR | Status: AC
  Administered 2012-08-15: 25 mg via INTRAMUSCULAR

## 2012-08-15 MED ORDER — PROMETHAZINE HCL 25 MG PO TABS: 25.0000 mg | ORAL_TABLET | Freq: Three times a day (TID) | ORAL | Status: DC | PRN

## 2012-08-15 MED ORDER — CYCLOBENZAPRINE HCL 10 MG PO TABS: 10.0000 mg | ORAL_TABLET | Freq: Every evening | ORAL | Status: AC | PRN

## 2012-08-15 MED ORDER — AMLODIPINE BESYLATE 5 MG PO TABS: 5.0000 mg | ORAL_TABLET | Freq: Every day | ORAL | Status: DC

## 2012-08-15 NOTE — Assessment & Plan Note (Signed)
Abnormal. Follow up suggested. Will defer orders to PCP.

## 2012-08-15 NOTE — Patient Instructions (Addendum)
Maria Griffith,  Thank you for coming in today. Your neurological exam in normal.   For you headache: 1. Take phenergan up to 3 times daily (do not to take it before work). 2. Take flexeril nightly. 3. Avoid caffeine, excess salt, excess sugar.  4. Drink water only.  5. Take a holiday from tylenol PM for at least 1 day, recommend 3 days. I recommend this because you appear to be having rebound headaches.  6. If you develop focal weakness (one arm or leg, on side of body), slurred speech, vision loss please call and come in sooner to be evaluated.   For high BP: Start amlodipine 5 mg daily.   F/u with RN in 1 week for BP check.   F/u with Dr. Karie Schwalbe in 2-3  weeks for  1. Headache f/u 2. BP f/u  3. STD check   Dr. Armen Pickup

## 2012-08-15 NOTE — Progress Notes (Signed)
Subjective:     Patient ID: Maria Griffith, female   DOB: 1961-05-11, 51 y.o.   MRN: [id number]  HPI 51 yo F presents with fiance to discuss the following:  1. Headaches: chronic for more than one year. Worse over the past week. Bilateral temple, tightness/pressure. Behind R eye today. 4/10 in severity today.  Associated with neck and shoulder pain, fatigue, blurred vision, nausea, generalized weakness. Patient denies fever, cough, sneezing, runny nose, focal motor deficit, tingling/numbness and trauma. Better with tylenol PM/rest 3-4 tabs two times per day. Nausea better with phenergan. Worse with loud sounds, bright lights and stress.   2. HTN: compliant with medications. Her ACEi was changed from benazepril to Accupril. She started accupril today. She denies CP, SOB and LE edema. Does not check BP at home.   3. Asked about mammogram: has not received results. Reviewed with her. Results abnormal. Spotting testing recommend, likely ultrasound. Will defer f/u to PCP. Patient informed and will await PCP call.   Review of Systems As per HPI    Objective:   Physical Exam BP 182/102  Pulse 64  Temp 98.1 F (36.7 C) (Oral)  Ht 5' 2.5" (1.588 m)  Wt 169 lb (76.658 kg)  BMI 30.42 kg/m2  LMP 07/24/2012 repeaat BP 178/98 General appearance: alert, cooperative and mild distress Head: Normocephalic, without obvious abnormality, atraumatic Ears: normal TM's and external ear canals both ears Nose: Nares normal. Septum midline. Mucosa normal. No drainage or sinus tenderness. Eyes: conjunctivae/corneas clear. PERRL, EOM's intact. Fundi benign. Neurologic: Alert and oriented X 3, normal strength and tone. Normal symmetric reflexes. Normal coordination and gait    Assessment and Plan:

## 2012-08-15 NOTE — Assessment & Plan Note (Signed)
A: chronic tension-type headaches. I suspect rebound headaches given increased frequency and increased analgesic use.  P:  -stop tylenol for next 3 days -refill phenergan -start muscle relaxer -recommend dietary changes: no caffeine. low salt and sugar. Water only.  Reviewed s/s of secondary headache to prompt return to care.  F/u in 2 weeks

## 2012-08-15 NOTE — Assessment & Plan Note (Signed)
A: declined. Symptomatic with headache. P: -add norvasc 5 mg daily -f/u in 1 week with RN for BP check -f/u with PCP in 2 weeks.

## 2012-08-21 ENCOUNTER — Ambulatory Visit
Admission: RE | Admit: 2012-08-21 | Discharge: 2012-08-21 | Disposition: A | Discharge status: Home / Self Care | Payer: Self-pay | Source: Ambulatory Visit | Attending: Emergency Medicine | Admitting: Emergency Medicine

## 2012-08-21 DIAGNOSIS — R928 Other abnormal and inconclusive findings on diagnostic imaging of breast: Secondary | ICD-10-CM

## 2012-08-22 ENCOUNTER — Ambulatory Visit (INDEPENDENT_AMBULATORY_CARE_PROVIDER_SITE_OTHER): Payer: Self-pay | Admitting: *Deleted

## 2012-08-22 VITALS — BP 156/94 | HR 68

## 2012-08-22 DIAGNOSIS — I10 Essential (primary) hypertension: Secondary | ICD-10-CM

## 2012-08-22 NOTE — Progress Notes (Signed)
In for BP check. BP checked manually using regular adult cuff. BP LA 154/96 and RA 156/94. Pulse 68. She did bring meds with her today. Taking as directed.  States headaches are a little improved.     Has a follow up appointment with Dr. Armen Pickup on  09/17 because Dr. Benjamin Stain did not have available appointment .  Will forward this message to Dr. Armen Pickup.  Patient wants STD check at that appointment also.

## 2012-09-03 ENCOUNTER — Other Ambulatory Visit (HOSPITAL_COMMUNITY)
Admission: RE | Admit: 2012-09-03 | Discharge: 2012-09-03 | Disposition: A | Discharge status: Home / Self Care | Payer: Self-pay | Source: Ambulatory Visit | Attending: Family Medicine | Admitting: Family Medicine

## 2012-09-03 ENCOUNTER — Encounter: Payer: Self-pay | Admitting: Family Medicine

## 2012-09-03 ENCOUNTER — Telehealth: Payer: Self-pay | Admitting: Family Medicine

## 2012-09-03 ENCOUNTER — Ambulatory Visit (INDEPENDENT_AMBULATORY_CARE_PROVIDER_SITE_OTHER): Payer: Self-pay | Admitting: Family Medicine

## 2012-09-03 VITALS — BP 135/87 | HR 76 | Temp 98.1°F | Ht 62.5 in | Wt 168.0 lb

## 2012-09-03 DIAGNOSIS — R0781 Pleurodynia: Secondary | ICD-10-CM

## 2012-09-03 DIAGNOSIS — I10 Essential (primary) hypertension: Secondary | ICD-10-CM

## 2012-09-03 DIAGNOSIS — Z113 Encounter for screening for infections with a predominantly sexual mode of transmission: Secondary | ICD-10-CM | POA: Insufficient documentation

## 2012-09-03 DIAGNOSIS — R071 Chest pain on breathing: Secondary | ICD-10-CM

## 2012-09-03 DIAGNOSIS — R920 Mammographic microcalcification found on diagnostic imaging of breast: Secondary | ICD-10-CM

## 2012-09-03 DIAGNOSIS — R5383 Other fatigue: Secondary | ICD-10-CM | POA: Insufficient documentation

## 2012-09-03 DIAGNOSIS — R5381 Other malaise: Secondary | ICD-10-CM

## 2012-09-03 DIAGNOSIS — Z202 Contact with and (suspected) exposure to infections with a predominantly sexual mode of transmission: Secondary | ICD-10-CM | POA: Insufficient documentation

## 2012-09-03 DIAGNOSIS — Z9189 Other specified personal risk factors, not elsewhere classified: Secondary | ICD-10-CM

## 2012-09-03 LAB — POCT WET PREP (WET MOUNT)

## 2012-09-03 LAB — CBC
HCT: 37.9 % (ref 36.0–46.0)
Hemoglobin: 13.1 g/dL (ref 12.0–15.0)
MCHC: 34.6 g/dL (ref 30.0–36.0)
MCV: 86.9 fL (ref 78.0–100.0)

## 2012-09-03 LAB — HIV ANTIBODY (ROUTINE TESTING W REFLEX): HIV: NONREACTIVE

## 2012-09-03 NOTE — Assessment & Plan Note (Signed)
A: potentially exposed.  P: Check Gc/chlamydia. Wet prep negative. HIV, RPR and HSV.  Recommend repeat HIV in 8 weeks if negative.  counseled patient on regular condom use and improved communication with sexual partner.  Recommend patient encourage her sexual partner to seek STD testing at [facility] health department.

## 2012-09-03 NOTE — Telephone Encounter (Signed)
Called patient and discussed that received [place] records and that would look through and send any mammogram results if these were present.  Patient agreeable to discuss current mammogram results with [facility] once they received [facility] records.  [name] 09/03/2012 6:15 PM

## 2012-09-03 NOTE — Assessment & Plan Note (Signed)
A: pain without cough or fever to suggest pneumonia/pneumonitis. Low risk for PE. Pulse ox 100%. P: Reassurance  Pain control prn with tylenol or NSAID. F/u as needed if pain persist consider f/u chest x-ray.

## 2012-09-03 NOTE — Assessment & Plan Note (Signed)
A: multiple possible causes including endocrine disorder and anemia. This is chronic per patient and does not appear to be worse in the setting of beta blocker use. No anemia when hemoglobin last checked two months ago.  P: Check TSH and CBC  CBGs all wnl in past.

## 2012-09-03 NOTE — Assessment & Plan Note (Signed)
A: improved. BP at goal of < 130/90.  Med: compliant and tolerating with minimal side effects (fatigue stable and trace LE edema from Norvasc) P: continue current regimen Check TSH to rule out secondary HTN and evaluate fatigue.

## 2012-09-03 NOTE — Telephone Encounter (Signed)
Called patient to inform her that I would find records from [place] regarding mammogram and if they had sent me those records among medical records I received from them, I would get to

## 2012-09-03 NOTE — Progress Notes (Addendum)
Subjective:     Patient ID: Maria Griffith, female   DOB: Jan 25, 1961, 51 y.o.   MRN: [id number]  HPI 51 yo F presents for f/u visit to discuss the following:  # HTN: denies HA, CP and shortness of breath. Admits to stable fatigue and bilateral LE edema that improves with compression stocking and elevation. Taking all medications as prescribed.   # Abnormal breast imaging: had f/u ultrasound and mammogram on 08/21/12. Was told that she had fibrocystic change in the R breast. The breast imaging center has contacted her previous care provider in [place] for comparison images. So far there have been no biopsies. No family history of breast cancer.   #STD exposure: sexually active with one partner. She is unsure how many partners her partner has. Using condoms irregularly. Has occasional itching. Denies discharge, odor, lesions/sores/rash.   #R upper back pain: pain with coughing/deep breathing with singing. R upper back. Stable x 2 months. Admit to bilateral LE edema to mid ankle. No hormone replacement therapy, never smoked, denies prolonged immobility but does drive school bus for employment.   # fatigue: chronic x many months. Even before taking beta blocker. Tired all the time especially during work.   Review of Systems As per HPI     Objective:   Physical Exam BP 135/87  Pulse 76  Temp 98.1 F (36.7 C) (Oral)  Ht 5' 2.5" (1.588 m)  Wt 168 lb (76.204 kg)  BMI 30.24 kg/m2  SpO2 100%  LMP 08/13/2012 General appearance: alert, cooperative and no distress Head: Normocephalic, without obvious abnormality, atraumatic Eyes: conjunctivae/corneas clear. PERRL, EOM's intact.  Neck: no adenopathy, no carotid bruit, no JVD, supple, symmetrical, trachea midline and thyroid not enlarged, symmetric, no tenderness/mass/nodules Lungs: clear to auscultation bilaterally Back: no rash on tenderness to palpation in area of pain.  Heart: regular rate and rhythm, S1, S2 normal, no murmur, click, rub or  gallop. Pelvic: cervix normal in appearance except two white areas at 3 o'clock lateral to os, scant bleeding note after cervical swab, external genitalia normal, no adnexal masses or tenderness, no cervical motion tenderness, rectovaginal septum normal, uterus normal size, shape, and consistency and vagina normal without discharge. Extremities: extremities normal, atraumatic, no cyanosis or edema Pulses: 2+ and symmetric Skin: Skin color, texture, turgor normal. No rashes or lesions Neurologic: Grossly normal    Assessment and Plan:

## 2012-09-03 NOTE — Patient Instructions (Addendum)
Demaria,  Thank you for coming in to see me today. Your blood pressure is within goal range of <140/90. Continue your current regimen.  For fatigue: I will check and CBC and TSH today. I do recommend a multivitamin every other day.   For breast: f/u with the breast imaging center to make sure they have received [place] records from [place]. [place] with [place] as [place].   For STD testing: I will call with results. Be sure to use condoms with every sexual encounter. Talk with your partner and encourage him to get tested at the health department if he does not have a doctor.   Your pain is your back with deep breathing and cough are most likely chest wall pain. I recommend tylenol. If symptoms persist please come back. Your exam and history does not strongly suggest any other cause like pneumonia or pulmobnary embolus.   I recommend follow with Dr. Karie Schwalbe in 3 months or sooner based on blood work results.   Dr. Armen Pickup

## 2012-09-03 NOTE — Assessment & Plan Note (Signed)
A: f/u mammogram and ultrasound done (no records available for review). Per patient she was told that she had fibrocystic change in her R breast. The breast imaging center of [place] is attempting to obtain comparison images from her previous healthcare provider in [place].  P: -nurses working to get image report either faxed over or uploaded into epic for review -encouraged patient to f/u closely with the breast imaging center regarding her records.

## 2012-09-04 LAB — HSV(HERPES SMPLX)ABS-I+II(IGG+IGM)-BLD: Herpes Simplex Vrs I&II-IgM Ab (EIA): 1.19 INDEX — ABNORMAL HIGH

## 2012-09-06 ENCOUNTER — Telehealth: Payer: Self-pay | Admitting: Family Medicine

## 2012-09-06 NOTE — Telephone Encounter (Signed)
Called patient regarding labs results: Negative Gc/chlamydia/HIV/RPR/wet prep/HSV2  Positive HSV1= cold sores/fever blisters possible. She has not history of this. If this happens she can try OTC abreva or come to clinic for script for valtrex.   Also regarding fatigue, normal Hgb and TSH.  She understood labs and had no questions.

## 2012-10-18 ENCOUNTER — Encounter (HOSPITAL_COMMUNITY): Payer: Self-pay | Admitting: *Deleted

## 2012-10-18 ENCOUNTER — Emergency Department (HOSPITAL_COMMUNITY): Payer: Self-pay

## 2012-10-18 ENCOUNTER — Observation Stay (HOSPITAL_COMMUNITY)
Admission: EM | Admit: 2012-10-18 | Discharge: 2012-10-18 | Disposition: A | Discharge status: Home / Self Care | Payer: Self-pay | Attending: Emergency Medicine | Admitting: Emergency Medicine

## 2012-10-18 ENCOUNTER — Observation Stay (HOSPITAL_COMMUNITY): Payer: Self-pay

## 2012-10-18 DIAGNOSIS — R002 Palpitations: Secondary | ICD-10-CM | POA: Insufficient documentation

## 2012-10-18 DIAGNOSIS — I10 Essential (primary) hypertension: Secondary | ICD-10-CM | POA: Insufficient documentation

## 2012-10-18 DIAGNOSIS — R51 Headache: Secondary | ICD-10-CM | POA: Insufficient documentation

## 2012-10-18 DIAGNOSIS — R0602 Shortness of breath: Secondary | ICD-10-CM | POA: Insufficient documentation

## 2012-10-18 DIAGNOSIS — R55 Syncope and collapse: Principal | ICD-10-CM | POA: Insufficient documentation

## 2012-10-18 DIAGNOSIS — R079 Chest pain, unspecified: Secondary | ICD-10-CM | POA: Insufficient documentation

## 2012-10-18 DIAGNOSIS — F411 Generalized anxiety disorder: Secondary | ICD-10-CM | POA: Insufficient documentation

## 2012-10-18 HISTORY — DX: Anxiety disorder, unspecified: F41.9

## 2012-10-18 LAB — CBC WITH DIFFERENTIAL/PLATELET
Basophils Absolute: 0 10*3/uL (ref 0.0–0.1)
HCT: 35.3 % — ABNORMAL LOW (ref 36.0–46.0)
Hemoglobin: 12.4 g/dL (ref 12.0–15.0)
Lymphocytes Relative: 49 % — ABNORMAL HIGH (ref 12–46)
Monocytes Absolute: 0.3 10*3/uL (ref 0.1–1.0)
Monocytes Relative: 9 % (ref 3–12)
Neutro Abs: 1 10*3/uL — ABNORMAL LOW (ref 1.7–7.7)
Neutrophils Relative %: 35 % — ABNORMAL LOW (ref 43–77)
WBC: 3 10*3/uL — ABNORMAL LOW (ref 4.0–10.5)

## 2012-10-18 LAB — BASIC METABOLIC PANEL
BUN: 18 mg/dL (ref 6–23)
CO2: 28 mEq/L (ref 19–32)
Chloride: 102 mEq/L (ref 96–112)
Creatinine, Ser: 0.93 mg/dL (ref 0.50–1.10)
Potassium: 3.5 mEq/L (ref 3.5–5.1)

## 2012-10-18 LAB — POCT I-STAT TROPONIN I
Troponin i, poc: 0.01 ng/mL (ref 0.00–0.08)
Troponin i, poc: 0 ng/mL (ref 0.00–0.08)

## 2012-10-18 MED ORDER — METOCLOPRAMIDE HCL 5 MG/ML IJ SOLN
10.0000 mg | Freq: Once | INTRAMUSCULAR | Status: AC
  Administered 2012-10-18: 10 mg via INTRAVENOUS
  Filled 2012-10-18: qty 2

## 2012-10-18 MED ORDER — ACETAMINOPHEN 325 MG PO TABS
650.0000 mg | ORAL_TABLET | Freq: Once | ORAL | Status: AC
  Administered 2012-10-18: 650 mg via ORAL
  Filled 2012-10-18: qty 2

## 2012-10-18 MED ORDER — METOPROLOL TARTRATE 1 MG/ML IV SOLN
INTRAVENOUS | Status: AC
  Administered 2012-10-18: 15:00:00
  Filled 2012-10-18: qty 5

## 2012-10-18 MED ORDER — NITROGLYCERIN 0.4 MG SL SUBL
SUBLINGUAL_TABLET | SUBLINGUAL | Status: AC
  Administered 2012-10-18: 15:00:00
  Filled 2012-10-18: qty 25

## 2012-10-18 MED ORDER — DIPHENHYDRAMINE HCL 50 MG/ML IJ SOLN
25.0000 mg | Freq: Once | INTRAMUSCULAR | Status: AC
  Administered 2012-10-18: 25 mg via INTRAVENOUS
  Filled 2012-10-18: qty 1

## 2012-10-18 MED ORDER — IOHEXOL 350 MG/ML SOLN
80.0000 mL | Freq: Once | INTRAVENOUS | Status: AC | PRN
  Administered 2012-10-18: 80 mL via INTRAVENOUS

## 2012-10-18 MED ORDER — METOPROLOL TARTRATE 25 MG PO TABS
100.0000 mg | ORAL_TABLET | Freq: Once | ORAL | Status: AC
  Administered 2012-10-18: 100 mg via ORAL
  Filled 2012-10-18: qty 4

## 2012-10-18 MED ORDER — LORAZEPAM 2 MG/ML IJ SOLN
2.0000 mg | Freq: Once | INTRAMUSCULAR | Status: AC
  Administered 2012-10-18: 2 mg via INTRAVENOUS
  Filled 2012-10-18: qty 1

## 2012-10-18 MED ORDER — SODIUM CHLORIDE 0.9 % IV SOLN
Freq: Once | INTRAVENOUS | Status: AC
  Administered 2012-10-18: 14:00:00 via INTRAVENOUS
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
## 2012-10-18 NOTE — ED Notes (Signed)
Family at bedside. 

## 2012-10-18 NOTE — ED Notes (Signed)
Pt returned from CT Scan 

## 2012-10-18 NOTE — ED Provider Notes (Signed)
Date: 10/18/2012  Rate: 59   Rhythm: sinus bradycardia  QRS Axis: normal  Intervals: normal  ST/T Wave abnormalities: normal  Conduction Disutrbances:none  Narrative Interpretation: Anterior Q waves consistent with old myocardial infarction. When compared with ECG of 09/02/2011, no significant changes are seen.  Old EKG Reviewed: unchanged  Medical screening examination/treatment/procedure(s) were performed by non-physician practitioner and as supervising physician I was immediately available for consultation/collaboration.   Dione Booze, MD 10/18/12 [phone]

## 2012-10-18 NOTE — ED Notes (Signed)
Pt moved in computer yet in radiology having exam.

## 2012-10-18 NOTE — ED Notes (Signed)
Patient transported to CT 

## 2012-10-18 NOTE — ED Notes (Signed)
Pt called out after receiving the reglan, complaining of anxiousness. Pt restless in bed, unable to sit still, states "I feel weird". PA made aware and orders received for ativan.
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
## 2012-10-18 NOTE — ED Notes (Signed)
Ordered lunch 

## 2012-10-18 NOTE — Progress Notes (Signed)
Utilization review completed.  

## 2012-10-22 ENCOUNTER — Telehealth: Payer: Self-pay | Admitting: Family Medicine

## 2012-10-22 ENCOUNTER — Encounter: Payer: Self-pay | Admitting: *Deleted

## 2012-10-22 ENCOUNTER — Encounter: Payer: Self-pay | Admitting: Family Medicine

## 2012-10-22 ENCOUNTER — Ambulatory Visit (INDEPENDENT_AMBULATORY_CARE_PROVIDER_SITE_OTHER): Payer: Self-pay | Admitting: Family Medicine

## 2012-10-22 VITALS — BP 153/111 | HR 56 | Temp 98.5°F | Ht 62.5 in | Wt 170.4 lb

## 2012-10-22 DIAGNOSIS — I10 Essential (primary) hypertension: Secondary | ICD-10-CM

## 2012-10-22 DIAGNOSIS — R55 Syncope and collapse: Secondary | ICD-10-CM

## 2012-10-22 DIAGNOSIS — R51 Headache: Secondary | ICD-10-CM

## 2012-10-22 MED ORDER — PROMETHAZINE HCL 25 MG PO TABS: 25.0000 mg | ORAL_TABLET | Freq: Four times a day (QID) | ORAL | Status: DC | PRN

## 2012-10-22 MED ORDER — CYCLOBENZAPRINE HCL 10 MG PO TABS: 10.0000 mg | ORAL_TABLET | Freq: Three times a day (TID) | ORAL | Status: DC | PRN

## 2012-10-22 MED ORDER — AMLODIPINE BESYLATE 5 MG PO TABS: 5.0000 mg | ORAL_TABLET | Freq: Every day | ORAL | Status: DC
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
## 2012-10-22 NOTE — Assessment & Plan Note (Signed)
Refill flexeril today.  Better blood pressure control Negative CT head on 10/18/12. Consider migraine prophylaxis.

## 2012-10-22 NOTE — Telephone Encounter (Signed)
Patient wanted you to call her regarding papers for disability.

## 2012-10-22 NOTE — Patient Instructions (Addendum)
Syncope Syncope is a fainting spell. This means the person loses consciousness and drops to the ground. The person is generally unconscious for less than 5 minutes. The person may have some muscle twitches for up to 15 seconds before waking up and returning to normal. Syncope occurs more often in elderly people, but it can happen to anyone. While most causes of syncope are not dangerous, syncope can be a sign of a serious medical problem. It is important to seek medical care.  CAUSES  Syncope is caused by a sudden decrease in blood flow to the brain. The specific cause is often not determined. Factors that can trigger syncope include:  Taking medicines that lower blood pressure.  Sudden changes in posture, such as standing up suddenly.  Taking more medicine than prescribed.  Standing in one place for too long.  Seizure disorders.  Dehydration and excessive exposure to heat.  Low blood sugar (hypoglycemia).  Straining to have a bowel movement.  Heart disease, irregular heartbeat, or other circulatory problems.  Fear, emotional distress, seeing blood, or severe pain. SYMPTOMS  Right before fainting, you may:  Feel dizzy or lightheaded.  Feel nauseous.  See all white or all black in your field of vision.  Have cold, clammy skin. DIAGNOSIS  Your caregiver will ask about your symptoms, perform a physical exam, and perform electrocardiography (ECG) to record the electrical activity of your heart. Your caregiver may also perform other heart or blood tests to determine the cause of your syncope. TREATMENT  In most cases, no treatment is needed. Depending on the cause of your syncope, your caregiver may recommend changing or stopping some of your medicines. HOME CARE INSTRUCTIONS  Have someone stay with you until you feel stable.  Do not drive, operate machinery, or play sports until your caregiver says it is okay.  Keep all follow-up appointments as directed by your  caregiver.  Lie down right away if you start feeling like you might faint. Breathe deeply and steadily. Wait until all the symptoms have passed.  Drink enough fluids to keep your urine clear or pale yellow.  If you are taking blood pressure or heart medicine, get up slowly, taking several minutes to sit and then stand. This can reduce dizziness. SEEK IMMEDIATE MEDICAL CARE IF:   You have a severe headache.  You have unusual pain in the chest, abdomen, or back.  You are bleeding from the mouth or rectum, or you have black or tarry stool.  You have an irregular or very fast heartbeat.  You have pain with breathing.  You have repeated fainting or seizure-like jerking during an episode.  You faint when sitting or lying down.  You have confusion.  You have difficulty walking.  You have severe weakness.  You have vision problems. If you fainted, call your local emergency services (911 in [place] Do not drive yourself to the hospital.  MAKE SURE YOU:  Understand these instructions.  Will watch your condition.  Will get help right away if you are not doing well or get worse. Document Released: 12/04/2005 Document Revised: 06/04/2012 Document Reviewed: 02/02/2012 [facility] Patient Information 2013 [place], [place].   Headaches, Frequently Asked Questions MIGRAINE HEADACHES Q: What is migraine? What causes it? How can I treat it? A: Generally, migraine headaches begin as a dull ache. Then they develop into a constant, throbbing, and pulsating pain. You may experience pain at the temples. You may experience pain at the front or back of one or both sides of  the head. The pain is usually accompanied by a combination of:  Nausea.  Vomiting.  Sensitivity to light and noise. Some people (about 15%) experience an aura (see below) before an attack. The cause of migraine is believed to be chemical reactions in the brain. Treatment for migraine may include over-the-counter or  prescription medications. It may also include self-help techniques. These include relaxation training and biofeedback.  Q: What is an aura? A: About 15% of people with migraine get an "aura". This is a sign of neurological symptoms that occur before a migraine headache. You may see wavy or jagged lines, dots, or flashing lights. You might experience tunnel vision or blind spots in one or both eyes. The aura can include visual or auditory hallucinations (something imagined). It may include disruptions in smell (such as strange odors), taste or touch. Other symptoms include:  Numbness.  A "pins and needles" sensation.  Difficulty in recalling or speaking the correct word. These neurological events may last as long as 60 minutes. These symptoms will fade as the headache begins. Q: What is a trigger? A: Certain physical or environmental factors can lead to or "trigger" a migraine. These include:  Foods.  Hormonal changes.  Weather.  Stress. It is important to remember that triggers are different for everyone. To help prevent migraine attacks, you need to figure out which triggers affect you. Keep a headache diary. This is a good way to track triggers. The diary will help you talk to your healthcare professional about your condition. Q: Does weather affect migraines? A: Bright sunshine, hot, humid conditions, and drastic changes in barometric pressure may lead to, or "trigger," a migraine attack in some people. But studies have shown that weather does not act as a trigger for everyone with migraines. Q: What is the link between migraine and hormones? A: Hormones start and regulate many of your body's functions. Hormones keep your body in balance within a constantly changing environment. The levels of hormones in your body are unbalanced at times. Examples are during menstruation, pregnancy, or menopause. That can lead to a migraine attack. In fact, about three quarters of all women with migraine  report that their attacks are related to the menstrual cycle.  Q: Is there an increased risk of stroke for migraine sufferers? A: The likelihood of a migraine attack causing a stroke is very remote. That is not to say that migraine sufferers cannot have a stroke associated with their migraines. In persons under age 39, the most common associated factor for stroke is migraine headache. But over the course of a person's normal life span, the occurrence of migraine headache may actually be associated with a reduced risk of dying from cerebrovascular disease due to stroke.  Q: What are acute medications for migraine? A: Acute medications are used to treat the pain of the headache after it has started. Examples over-the-counter medications, NSAIDs, ergots, and triptans.  Q: What are the triptans? A: Triptans are the newest class of abortive medications. They are specifically targeted to treat migraine. Triptans are vasoconstrictors. They moderate some chemical reactions in the brain. The triptans work on receptors in your brain. Triptans help to restore the balance of a neurotransmitter called serotonin. Fluctuations in levels of serotonin are thought to be a main cause of migraine.  Q: Are over-the-counter medications for migraine effective? A: Over-the-counter, or "OTC," medications may be effective in relieving mild to moderate pain and associated symptoms of migraine. But you should see your caregiver before beginning  any treatment regimen for migraine.  Q: What are preventive medications for migraine? A: Preventive medications for migraine are sometimes referred to as "prophylactic" treatments. They are used to reduce the frequency, severity, and length of migraine attacks. Examples of preventive medications include antiepileptic medications, antidepressants, beta-blockers, calcium channel blockers, and NSAIDs (nonsteroidal anti-inflammatory drugs). Q: Why are anticonvulsants used to treat migraine? A:  During the past few years, there has been an increased interest in antiepileptic drugs for the prevention of migraine. They are sometimes referred to as "anticonvulsants". Both epilepsy and migraine may be caused by similar reactions in the brain.  Q: Why are antidepressants used to treat migraine? A: Antidepressants are typically used to treat people with depression. They may reduce migraine frequency by regulating chemical levels, such as serotonin, in the brain.  Q: What alternative therapies are used to treat migraine? A: The term "alternative therapies" is often used to describe treatments considered outside the scope of conventional Western medicine. Examples of alternative therapy include acupuncture, acupressure, and yoga. Another common alternative treatment is herbal therapy. Some herbs are believed to relieve headache pain. Always discuss alternative therapies with your caregiver before proceeding. Some herbal products contain arsenic and other toxins. TENSION HEADACHES Q: What is a tension-type headache? What causes it? How can I treat it? A: Tension-type headaches occur randomly. They are often the result of temporary stress, anxiety, fatigue, or anger. Symptoms include soreness in your temples, a tightening band-like sensation around your head (a "vice-like" ache). Symptoms can also include a pulling feeling, pressure sensations, and contracting head and neck muscles. The headache begins in your forehead, temples, or the back of your head and neck. Treatment for tension-type headache may include over-the-counter or prescription medications. Treatment may also include self-help techniques such as relaxation training and biofeedback. CLUSTER HEADACHES Q: What is a cluster headache? What causes it? How can I treat it? A: Cluster headache gets its name because the attacks come in groups. The pain arrives with little, if any, warning. It is usually on one side of the head. A tearing or bloodshot  eye and a runny nose on the same side of the headache may also accompany the pain. Cluster headaches are believed to be caused by chemical reactions in the brain. They have been described as the most severe and intense of any headache type. Treatment for cluster headache includes prescription medication and oxygen. SINUS HEADACHES Q: What is a sinus headache? What causes it? How can I treat it? A: When a cavity in the bones of the face and skull (a sinus) becomes inflamed, the inflammation will cause localized pain. This condition is usually the result of an allergic reaction, a tumor, or an infection. If your headache is caused by a sinus blockage, such as an infection, you will probably have a fever. An x-ray will confirm a sinus blockage. Your caregiver's treatment might include antibiotics for the infection, as well as antihistamines or decongestants.  REBOUND HEADACHES Q: What is a rebound headache? What causes it? How can I treat it? A: A pattern of taking acute headache medications too often can lead to a condition known as "rebound headache." A pattern of taking too much headache medication includes taking it more than 2 days per week or in excessive amounts. That means more than the label or a caregiver advises. With rebound headaches, your medications not only stop relieving pain, they actually begin to cause headaches. Doctors treat rebound headache by tapering the medication that is being  overused. Sometimes your caregiver will gradually substitute a different type of treatment or medication. Stopping may be a challenge. Regularly overusing a medication increases the potential for serious side effects. Consult a caregiver if you regularly use headache medications more than 2 days per week or more than the label advises. ADDITIONAL QUESTIONS AND ANSWERS Q: What is biofeedback? A: Biofeedback is a self-help treatment. Biofeedback uses special equipment to monitor your body's involuntary physical  responses. Biofeedback monitors:  Breathing.  Pulse.  Heart rate.  Temperature.  Muscle tension.  Brain activity. Biofeedback helps you refine and perfect your relaxation exercises. You learn to control the physical responses that are related to stress. Once the technique has been mastered, you do not need the equipment any more. Q: Are headaches hereditary? A: Four out of five (80%) of people that suffer report a family history of migraine. Scientists are not sure if this is genetic or a family predisposition. Despite the uncertainty, a child has a 50% chance of having migraine if one parent suffers. The child has a 75% chance if both parents suffer.  Q: Can children get headaches? A: By the time they reach high school, most young people have experienced some type of headache. Many safe and effective approaches or medications can prevent a headache from occurring or stop it after it has begun.  Q: What type of doctor should I see to diagnose and treat my headache? A: Start with your primary caregiver. Discuss his or her experience and approach to headaches. Discuss methods of classification, diagnosis, and treatment. Your caregiver may decide to recommend you to a headache specialist, depending upon your symptoms or other physical conditions. Having diabetes, allergies, etc., may require a more comprehensive and inclusive approach to your headache. The National Headache Foundation will provide, upon request, a list of [facility] physician members in your state. Document Released: 02/24/2004 Document Revised: 02/26/2012 Document Reviewed: 08/03/2008 [facility] Patient Information 2013 [place], [place].  Hypertension As your heart beats, it forces blood through your arteries. This force is your blood pressure. If the pressure is too high, it is called hypertension (HTN) or high blood pressure. HTN is dangerous because you may have it and not know it. High blood pressure may mean that your heart has to  work harder to pump blood. Your arteries may be narrow or stiff. The extra work puts you at risk for heart disease, stroke, and other problems.  Blood pressure consists of two numbers, a higher number over a lower, 110/72, for example. It is stated as "110 over 72." The ideal is below 120 for the top number (systolic) and under 80 for the bottom (diastolic). Write down your blood pressure today. You should pay close attention to your blood pressure if you have certain conditions such as:  Heart failure.  Prior heart attack.  Diabetes  Chronic kidney disease.  Prior stroke.  Multiple risk factors for heart disease. To see if you have HTN, your blood pressure should be measured while you are seated with your arm held at the level of the heart. It should be measured at least twice. A one-time elevated blood pressure reading (especially in the Emergency Department) does not mean that you need treatment. There may be conditions in which the blood pressure is different between your right and left arms. It is important to see your caregiver soon for a recheck. Most people have essential hypertension which means that there is not a specific cause. This type of high blood pressure may  be lowered by changing lifestyle factors such as:  Stress.  Smoking.  Lack of exercise.  Excessive weight.  Drug/tobacco/alcohol use.  Eating less salt. Most people do not have symptoms from high blood pressure until it has caused damage to the body. Effective treatment can often prevent, delay or reduce that damage. TREATMENT  When a cause has been identified, treatment for high blood pressure is directed at the cause. There are a large number of medications to treat HTN. These fall into several categories, and your caregiver will help you select the medicines that are best for you. Medications may have side effects. You should review side effects with your caregiver. If your blood pressure stays high after you  have made lifestyle changes or started on medicines,   Your medication(s) may need to be changed.  Other problems may need to be addressed.  Be certain you understand your prescriptions, and know how and when to take your medicine.  Be sure to follow up with your caregiver within the time frame advised (usually within two weeks) to have your blood pressure rechecked and to review your medications.  If you are taking more than one medicine to lower your blood pressure, make sure you know how and at what times they should be taken. Taking two medicines at the same time can result in blood pressure that is too low. SEEK IMMEDIATE MEDICAL CARE IF:  You develop a severe headache, blurred or changing vision, or confusion.  You have unusual weakness or numbness, or a faint feeling.  You have severe chest or abdominal pain, vomiting, or breathing problems. MAKE SURE YOU:   Understand these instructions.  Will watch your condition.  Will get help right away if you are not doing well or get worse. Document Released: 12/04/2005 Document Revised: 02/26/2012 Document Reviewed: 07/24/2008 [facility] Patient Information 2013 [place], [place].

## 2012-10-22 NOTE — Assessment & Plan Note (Signed)
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

## 2012-10-22 NOTE — Assessment & Plan Note (Signed)
BP high today. 161/93, 153/111 Refill norvasc as has not taken in a week. F/U with PCP

## 2012-10-23 ENCOUNTER — Ambulatory Visit (HOSPITAL_COMMUNITY)
Admission: RE | Admit: 2012-10-23 | Discharge: 2012-10-23 | Disposition: A | Discharge status: Home / Self Care | Payer: Self-pay | Source: Ambulatory Visit | Attending: Family Medicine | Admitting: Family Medicine

## 2012-10-23 ENCOUNTER — Encounter (HOSPITAL_COMMUNITY): Payer: Self-pay | Admitting: *Deleted

## 2012-10-23 DIAGNOSIS — R079 Chest pain, unspecified: Secondary | ICD-10-CM | POA: Insufficient documentation

## 2012-10-23 DIAGNOSIS — R55 Syncope and collapse: Secondary | ICD-10-CM | POA: Insufficient documentation

## 2012-10-23 DIAGNOSIS — I10 Essential (primary) hypertension: Secondary | ICD-10-CM | POA: Insufficient documentation

## 2012-10-23 DIAGNOSIS — I359 Nonrheumatic aortic valve disorder, unspecified: Secondary | ICD-10-CM | POA: Insufficient documentation

## 2012-10-23 NOTE — Telephone Encounter (Signed)
Attempted to call patient at home and mobile numbers.  Left voicemail on home number that returning her call and that she can try me again at clinic number, and to specify what exactly she needs help with.  Mobile number had no option for voicemail.  

## 2012-10-23 NOTE — Telephone Encounter (Signed)
Attempted to call patient at home and mobile numbers.  Left voicemail on home number that returning her call and that she can try me again at clinic number, and to specify what exactly she needs help with.  Mobile number had no option for voicemail.

## 2012-10-24 ENCOUNTER — Ambulatory Visit (INDEPENDENT_AMBULATORY_CARE_PROVIDER_SITE_OTHER): Payer: PRIVATE HEALTH INSURANCE | Admitting: Family Medicine

## 2012-10-24 ENCOUNTER — Encounter: Payer: Self-pay | Admitting: Family Medicine

## 2012-10-24 VITALS — BP 126/85 | HR 67 | Temp 98.9°F | Ht 62.5 in | Wt 174.0 lb

## 2012-10-24 DIAGNOSIS — F411 Generalized anxiety disorder: Secondary | ICD-10-CM

## 2012-10-24 DIAGNOSIS — R55 Syncope and collapse: Secondary | ICD-10-CM

## 2012-10-24 DIAGNOSIS — Z23 Encounter for immunization: Secondary | ICD-10-CM

## 2012-10-24 DIAGNOSIS — F419 Anxiety disorder, unspecified: Secondary | ICD-10-CM | POA: Insufficient documentation

## 2012-10-24 MED ORDER — ALPRAZOLAM 0.25 MG PO TABS: 0.2500 mg | ORAL_TABLET | Freq: Three times a day (TID) | ORAL | Status: DC | PRN

## 2012-10-24 MED ORDER — CITALOPRAM HYDROBROMIDE 20 MG PO TABS: 20.0000 mg | ORAL_TABLET | Freq: Every day | ORAL | Status: DC

## 2012-10-24 NOTE — Patient Instructions (Addendum)
[name],  Thank you for coming in today.  For syncope/lightheaded: stop toprol XL.  For anxiety: Start celexa take once daily after xanax for at least the first week. Continue xanax as needed.   F/u in one weeks with me.   Dr. Armen Pickup

## 2012-10-28 ENCOUNTER — Encounter: Payer: Self-pay | Admitting: Family Medicine

## 2012-10-28 ENCOUNTER — Other Ambulatory Visit: Payer: Self-pay | Admitting: Family Medicine

## 2012-10-28 ENCOUNTER — Encounter: Payer: Self-pay | Admitting: *Deleted

## 2012-10-28 ENCOUNTER — Telehealth: Payer: Self-pay | Admitting: Family Medicine

## 2012-10-28 ENCOUNTER — Ambulatory Visit: Payer: Self-pay

## 2012-10-28 DIAGNOSIS — R55 Syncope and collapse: Secondary | ICD-10-CM

## 2012-10-28 NOTE — Progress Notes (Signed)
Patient is a 51 y.o. who had an episode of syncope and chest pain and went to ED, was in chest pain unit and released after CTA of heart that showed "no coronary artery disease. The patient's total coronary  artery calcium score. Extracardiac findings pertinent only for mild dilatation of the sinuses of Valsalva. Mild cardiomegaly. Right-sided coronary artery dominance."  Followed up after visit with [facility] and was referred to cardiology.  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  In the setting of her being a poor historian, before clearing her, would like to consider if any further workup of syncope necessary. Echocardiogram showed mild cardiomegaly.     Maria Griffith 10/28/2012 5:44 PM

## 2012-10-28 NOTE — Telephone Encounter (Signed)
Has spoken with [facility] today - they said that her appt is Dec 5th for just a consultation and that if she just needs a stress test, that our office needs to set that up instead.  She is trying to be able to go back to work as quickly as possible.  Would like for someone to call her asap

## 2012-10-29 ENCOUNTER — Other Ambulatory Visit: Payer: Self-pay

## 2012-10-29 ENCOUNTER — Ambulatory Visit (INDEPENDENT_AMBULATORY_CARE_PROVIDER_SITE_OTHER): Payer: Self-pay | Admitting: Cardiovascular Disease

## 2012-10-29 ENCOUNTER — Observation Stay (HOSPITAL_COMMUNITY): Payer: PRIVATE HEALTH INSURANCE

## 2012-10-29 ENCOUNTER — Emergency Department (HOSPITAL_COMMUNITY): Payer: PRIVATE HEALTH INSURANCE

## 2012-10-29 ENCOUNTER — Inpatient Hospital Stay (HOSPITAL_COMMUNITY)
Admission: EM | Admit: 2012-10-29 | Discharge: 2012-11-01 | DRG: 103 | Disposition: A | Discharge status: Home / Self Care | Payer: PRIVATE HEALTH INSURANCE | Attending: Family Medicine | Admitting: Family Medicine

## 2012-10-29 ENCOUNTER — Encounter (HOSPITAL_COMMUNITY): Payer: Self-pay | Admitting: Emergency Medicine

## 2012-10-29 DIAGNOSIS — R55 Syncope and collapse: Secondary | ICD-10-CM

## 2012-10-29 DIAGNOSIS — F411 Generalized anxiety disorder: Secondary | ICD-10-CM

## 2012-10-29 DIAGNOSIS — R0989 Other specified symptoms and signs involving the circulatory and respiratory systems: Secondary | ICD-10-CM

## 2012-10-29 DIAGNOSIS — R51 Headache: Secondary | ICD-10-CM

## 2012-10-29 DIAGNOSIS — I10 Essential (primary) hypertension: Secondary | ICD-10-CM

## 2012-10-29 DIAGNOSIS — G43109 Migraine with aura, not intractable, without status migrainosus: Principal | ICD-10-CM | POA: Diagnosis present

## 2012-10-29 DIAGNOSIS — Z6831 Body mass index (BMI) 31.0-31.9, adult: Secondary | ICD-10-CM

## 2012-10-29 DIAGNOSIS — E669 Obesity, unspecified: Secondary | ICD-10-CM | POA: Diagnosis present

## 2012-10-29 DIAGNOSIS — Z87891 Personal history of nicotine dependence: Secondary | ICD-10-CM

## 2012-10-29 DIAGNOSIS — Z79899 Other long term (current) drug therapy: Secondary | ICD-10-CM

## 2012-10-29 LAB — BASIC METABOLIC PANEL
BUN: 17 mg/dL (ref 6–23)
Calcium: 9.6 mg/dL (ref 8.4–10.5)
Creatinine, Ser: 0.91 mg/dL (ref 0.50–1.10)
GFR calc non Af Amer: 72 mL/min — ABNORMAL LOW (ref >90)
Glucose, Bld: 86 mg/dL (ref 70–99)
Sodium: 135 mEq/L (ref 135–145)

## 2012-10-29 LAB — CBC WITH DIFFERENTIAL/PLATELET
Eosinophils Absolute: 0.2 10*3/uL (ref 0.0–0.7)
Eosinophils Relative: 5 % (ref 0–5)
Lymphs Abs: 1.5 10*3/uL (ref 0.7–4.0)
MCH: 29.8 pg (ref 26.0–34.0)
MCV: 85.4 fL (ref 78.0–100.0)
Platelets: 257 10*3/uL (ref 150–400)
RBC: 4.39 MIL/uL (ref 3.87–5.11)

## 2012-10-29 LAB — URINALYSIS, ROUTINE W REFLEX MICROSCOPIC
Bilirubin Urine: NEGATIVE
Ketones, ur: NEGATIVE mg/dL
Protein, ur: NEGATIVE mg/dL
Urobilinogen, UA: 0.2 mg/dL (ref 0.0–1.0)

## 2012-10-29 MED ORDER — PROCHLORPERAZINE EDISYLATE 5 MG/ML IJ SOLN
10.0000 mg | INTRAMUSCULAR | Status: AC
  Administered 2012-10-29: 10 mg via INTRAVENOUS
  Filled 2012-10-29: qty 2

## 2012-10-29 MED ORDER — DIPHENHYDRAMINE HCL 50 MG/ML IJ SOLN
25.0000 mg | Freq: Once | INTRAMUSCULAR | Status: AC
  Administered 2012-10-29: 25 mg via INTRAVENOUS

## 2012-10-29 MED ORDER — DIPHENHYDRAMINE HCL 50 MG/ML IJ SOLN
INTRAMUSCULAR | Status: AC
  Filled 2012-10-29: qty 1

## 2012-10-29 MED ORDER — ASPIRIN EC 325 MG PO TBEC
325.0000 mg | DELAYED_RELEASE_TABLET | Freq: Every day | ORAL | Status: DC
  Administered 2012-10-30 – 2012-11-01 (×3): 325 mg via ORAL
  Filled 2012-10-29 (×3): qty 1

## 2012-10-29 MED ORDER — METOCLOPRAMIDE HCL 5 MG/ML IJ SOLN
10.0000 mg | Freq: Once | INTRAMUSCULAR | Status: DC
  Filled 2012-10-29: qty 2

## 2012-10-29 MED ORDER — LORAZEPAM 2 MG/ML IJ SOLN
1.0000 mg | Freq: Once | INTRAMUSCULAR | Status: AC
  Administered 2012-10-29: 1 mg via INTRAVENOUS

## 2012-10-29 MED ORDER — KETOROLAC TROMETHAMINE 30 MG/ML IJ SOLN
30.0000 mg | Freq: Once | INTRAMUSCULAR | Status: AC
  Administered 2012-10-29: 30 mg via INTRAVENOUS
  Filled 2012-10-29: qty 1

## 2012-10-29 MED ORDER — SODIUM CHLORIDE 0.9 % IV BOLUS (SEPSIS)
1000.0000 mL | Freq: Once | INTRAVENOUS | Status: AC
  Administered 2012-10-29: 1000 mL via INTRAVENOUS

## 2012-10-29 MED ORDER — LORAZEPAM 2 MG/ML IJ SOLN
INTRAMUSCULAR | Status: AC
  Filled 2012-10-29: qty 1
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
## 2012-10-29 NOTE — ED Provider Notes (Signed)
51 year old female has been having problems with the chronic headaches and had been evaluated for syncopal episode 2 weeks ago. Today in her doctor's office she had a near syncopal episode. Her evaluation for her headache had been unremarkable with a negative CT and she had been recommended to have an MRI scan to more fully evaluate the headache. On exam, and she has no focal neurologic findings. There is no tenderness over the temporal arteries her over the temporalis or paracervical muscles. Neurologic exam is nonfocal. MRI has been ordered and she will be given a therapeutic file of Compazine for her headache. Review of her prior records shows that she had an unremarkable cardiac CT scan but she did have a reaction to metoclopramide which required lorazepam.  I saw and evaluated the patient, reviewed the resident's note and I agree with the findings and plan.   Dione Booze, MD 10/29/12 [phone]

## 2012-10-29 NOTE — ED Provider Notes (Signed)
MRI results reviewed, discussed with resident and Dr. Preston Fleeting.  Tiny lacunar infarcts in brain sten and left superior cerebellum suspected without mass effect or hemorrhage.  Neuro-hospitalist contacted, will see.  11:37 PM Patient has been seen and evaluated by neuro-hospitalist.  Recommends admission for completion of repeat MRI an AM with thin cuts in brain stem and cerebellum.  Patient is followed by Family Practice--they have been contacted for admission.  Jimmye Norman, NP 10/29/12 [phone]
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
## 2012-10-29 NOTE — ED Provider Notes (Signed)
History     CSN: [id number]  Arrival date & time 10/29/12  1521   First MD Initiated Contact with Patient 10/29/12 1530      Chief Complaint  Patient presents with  . Weakness  . Dizziness  . Nausea    (Consider location/radiation/quality/duration/timing/severity/associated sxs/prior treatment) HPI Comments: The patient was sent from cardiology office when she had a presyncopal episode today. She notes worsening of her chronic headache over the past 2 weeks. She was seen here on November 1 and had a normal head CT for similar complaints.  Patient is a 52 y.o. female presenting with headaches. The history is provided by the patient and the spouse. No language interpreter was used.  Headache  This is a chronic problem. The current episode started more than 1 week ago. The problem occurs constantly. The problem has been gradually worsening. The headache is associated with bright light, activity and loud noise. The pain is located in the occipital region. The quality of the pain is described as dull. The pain is at a severity of 9/10. The pain is severe. The pain does not radiate. Associated symptoms include malaise/fatigue, near-syncope and nausea. Pertinent negatives include no anorexia, no fever, no shortness of breath and no vomiting. She has tried NSAIDs for the symptoms. The treatment provided no relief.    Past Medical History  Diagnosis Date  . Hypertension   . Cellulitis     of the left leg  . Anxiety     Past Surgical History  Procedure Date  . Tubal ligation     1989  . Cesarean section     each of 3 children  . Myomectomy     via laser surgery, per pt  . Laser ablation/cauterization of endometrial implants 2001    Fibroid tumors     No family history on file.  History  Substance Use Topics  . Smoking status: Former Smoker -- 1.0 packs/day for 7 years    Types: Cigarettes    Quit date: 07/03/1988  . Smokeless tobacco: Not on file  . Alcohol Use: No    OB  History    Grav Para Term Preterm Abortions TAB SAB Ect Mult Living   3 3 3  0 0 0 0 0 0 3     Obstetric Comments   - All c-sections because pt told "pelvis was too small."  Gyn:  - Patient's most recent pap was normal but had a previous one that had been abnormal  - H/o STD (pt thinks trichomonas but unsure - pt and partner were treated).      Review of Systems  Constitutional: Positive for malaise/fatigue and fatigue. Negative for fever, chills, activity change and appetite change.  HENT: Negative for congestion, rhinorrhea, neck pain, neck stiffness and sinus pressure.   Eyes: Negative for discharge and visual disturbance.  Respiratory: Negative for cough, chest tightness, shortness of breath, wheezing and stridor.   Cardiovascular: Positive for near-syncope. Negative for chest pain and leg swelling.  Gastrointestinal: Positive for nausea. Negative for vomiting, abdominal pain, diarrhea, abdominal distention and anorexia.  Genitourinary: Negative for decreased urine volume and difficulty urinating.  Musculoskeletal: Negative for back pain and arthralgias.  Skin: Negative for color change and pallor.  Neurological: Positive for weakness (diffuse), numbness (bilat hands, resolved) and headaches. Negative for dizziness, tremors, seizures, syncope (near), speech difficulty and light-headedness.  Psychiatric/Behavioral: Negative for behavioral problems and agitation.  All other systems reviewed and are negative.    Allergies  Iohexol  Home Medications   Current Outpatient Rx  Name  Route  Sig  Dispense  Refill  . ALBUTEROL SULFATE HFA 108 (90 BASE) MCG/ACT IN AERS   Inhalation   Inhale 2 puffs into the lungs every 6 (six) hours as needed. For wheezing         . ALPRAZOLAM 0.25 MG PO TABS   Oral   Take 0.25 mg by mouth 3 (three) times daily as needed. For anxiety         . AMLODIPINE BESYLATE 5 MG PO TABS   Oral   Take 5 mg by mouth daily.         Marland Kitchen CITALOPRAM  HYDROBROMIDE 20 MG PO TABS   Oral   Take 20 mg by mouth daily.         . CYCLOBENZAPRINE HCL 10 MG PO TABS   Oral   Take 10 mg by mouth 3 (three) times daily as needed. For muscle spasms         . PROMETHAZINE HCL 25 MG PO TABS   Oral   Take 25 mg by mouth every 6 (six) hours as needed. For nausea         . QUINAPRIL-HYDROCHLOROTHIAZIDE 20-12.5 MG PO TABS   Oral   Take 1 tablet by mouth daily.           BP 157/95  Pulse 85  Temp 98.3 F (36.8 C) (Oral)  Resp 16  SpO2 100%  LMP 10/10/2012  Physical Exam  Nursing note and vitals reviewed. Constitutional: She is oriented to person, place, and time. She appears well-developed and well-nourished. No distress.  HENT:  Head: Normocephalic and atraumatic.  Mouth/Throat: No oropharyngeal exudate.  Eyes: EOM are normal. Pupils are equal, round, and reactive to light. Right eye exhibits no discharge. Left eye exhibits no discharge.  Neck: Normal range of motion. Neck supple. No JVD present.  Cardiovascular: Normal rate, regular rhythm and normal heart sounds.   Pulmonary/Chest: Effort normal and breath sounds normal. No stridor. No respiratory distress. She exhibits no tenderness.  Abdominal: Soft. Bowel sounds are normal. She exhibits no distension. There is no tenderness. There is no guarding.  Musculoskeletal: Normal range of motion. She exhibits no edema and no tenderness.  Neurological: She is oriented to person, place, and time. She has normal reflexes. She displays normal reflexes. No cranial nerve deficit. She exhibits abnormal muscle tone (4/5 grip bilat). Coordination normal.       nml f to n. Mildly drowsy on exam, did not follow commands very well. + photophobia  Skin: Skin is warm and dry. No rash noted. She is not diaphoretic.  Psychiatric: She has a normal mood and affect. Her behavior is normal. Judgment and thought content normal.    ED Course  Procedures (including critical care time)  Labs Reviewed    BASIC METABOLIC PANEL - Abnormal; Notable for the following:    GFR calc non Af Amer 72 (*)     GFR calc Af Amer 84 (*)     All other components within normal limits  URINALYSIS, ROUTINE W REFLEX MICROSCOPIC - Abnormal; Notable for the following:    APPearance HAZY (*)     Hgb urine dipstick TRACE (*)     All other components within normal limits  URINE MICROSCOPIC-ADD ON - Abnormal; Notable for the following:    Squamous Epithelial / LPF MANY (*)     Bacteria, UA FEW (*)     All other components within  normal limits  CBC WITH DIFFERENTIAL   Dg Chest 2 View  10/29/2012  *RADIOLOGY REPORT*  Clinical Data: Weakness and dizziness  CHEST - 2 VIEW  Comparison: 10/18/2012  Findings: Mild cardiac enlargement.  No pleural effusion or edema. No air space consolidation.  Review of the visualized osseous structures is unremarkable.  IMPRESSION: No acute cardiopulmonary abnormalities.   Original Report Authenticated By: Signa Kell, M.D.      1. Headache   2. Pre-syncope       MDM  7:41 PM to move pt to CDU to wait for results of MRI, will obtain due to persistent HA and worsening pain. ddx incl mass lesion, incr ICP. DSVT less likely, doubt SAH. O/w likely migraine. Did not respond to toradol. Anxious w/ compazine so gave ativan. stable       Warrick Parisian, MD 10/29/12 [phone]

## 2012-10-29 NOTE — ED Provider Notes (Signed)
Medical screening examination/treatment/procedure(s) were conducted as a shared visit with non-physician practitioner(s) and myself.  I personally evaluated the patient during the encounter   Dione Booze, MD 10/29/12 2348

## 2012-10-29 NOTE — ED Notes (Signed)
Pt returned from MRI °

## 2012-10-29 NOTE — ED Notes (Signed)
Pt returned from x-ray and placed back on cardiac monitor. Vital signs stable. No distress noted.

## 2012-10-29 NOTE — ED Notes (Signed)
Patient transported to X-ray 

## 2012-10-29 NOTE — ED Notes (Signed)
Pt arrived by [facility] from cardiology office. Pt c/o headache all day today. Pt was getting on scale at cardiologist and had a sudden onset of weakness dizzy and nausea. Denies CP. BP- 150110 HR- 80 CBG-84.

## 2012-10-30 ENCOUNTER — Encounter (HOSPITAL_COMMUNITY): Payer: Self-pay

## 2012-10-30 ENCOUNTER — Inpatient Hospital Stay (HOSPITAL_COMMUNITY): Payer: PRIVATE HEALTH INSURANCE

## 2012-10-30 LAB — LIPID PANEL
Cholesterol: 159 mg/dL (ref 0–200)
HDL: 47 mg/dL (ref >39)
LDL Cholesterol: 96 mg/dL (ref 0–99)
Triglycerides: 80 mg/dL (ref <150)

## 2012-10-30 LAB — HEMOGLOBIN A1C
Hgb A1c MFr Bld: 5.5 % (ref <5.7)
Mean Plasma Glucose: 111 mg/dL (ref <117)

## 2012-10-30 MED ORDER — SENNOSIDES-DOCUSATE SODIUM 8.6-50 MG PO TABS: 1.0000 | ORAL_TABLET | Freq: Every evening | ORAL | Status: DC | PRN

## 2012-10-30 MED ORDER — ALBUTEROL SULFATE HFA 108 (90 BASE) MCG/ACT IN AERS: 2.0000 | INHALATION_SPRAY | Freq: Four times a day (QID) | RESPIRATORY_TRACT | Status: DC | PRN

## 2012-10-30 MED ORDER — LISINOPRIL 20 MG PO TABS
20.0000 mg | ORAL_TABLET | Freq: Every day | ORAL | Status: DC
  Administered 2012-10-30 – 2012-11-01 (×3): 20 mg via ORAL
  Filled 2012-10-30 (×3): qty 1

## 2012-10-30 MED ORDER — ONDANSETRON HCL 4 MG/2ML IJ SOLN
4.0000 mg | Freq: Four times a day (QID) | INTRAMUSCULAR | Status: DC | PRN
  Administered 2012-10-31: 4 mg via INTRAVENOUS
  Filled 2012-10-30: qty 2

## 2012-10-30 MED ORDER — CITALOPRAM HYDROBROMIDE 20 MG PO TABS
20.0000 mg | ORAL_TABLET | Freq: Every day | ORAL | Status: DC
  Administered 2012-10-30 – 2012-11-01 (×3): 20 mg via ORAL
  Filled 2012-10-30 (×3): qty 1

## 2012-10-30 MED ORDER — HYDROCHLOROTHIAZIDE 12.5 MG PO CAPS
12.5000 mg | ORAL_CAPSULE | Freq: Every day | ORAL | Status: DC
  Administered 2012-10-30 – 2012-11-01 (×3): 12.5 mg via ORAL
  Filled 2012-10-30 (×3): qty 1

## 2012-10-30 MED ORDER — SODIUM CHLORIDE 0.9 % IV SOLN
INTRAVENOUS | Status: DC
  Administered 2012-11-01: 20 mL/h via INTRAVENOUS

## 2012-10-30 MED ORDER — GADOBENATE DIMEGLUMINE 529 MG/ML IV SOLN
17.0000 mL | Freq: Once | INTRAVENOUS | Status: AC | PRN
  Administered 2012-10-30: 17 mL via INTRAVENOUS

## 2012-10-30 MED ORDER — AMLODIPINE BESYLATE 5 MG PO TABS
5.0000 mg | ORAL_TABLET | Freq: Every day | ORAL | Status: DC
  Administered 2012-10-30 – 2012-11-01 (×3): 5 mg via ORAL
  Filled 2012-10-30 (×3): qty 1

## 2012-10-30 MED ORDER — QUINAPRIL-HYDROCHLOROTHIAZIDE 20-12.5 MG PO TABS: 1.0000 | ORAL_TABLET | Freq: Every day | ORAL | Status: DC

## 2012-10-30 MED ORDER — ENOXAPARIN SODIUM 40 MG/0.4ML ~~LOC~~ SOLN
40.0000 mg | SUBCUTANEOUS | Status: DC
  Administered 2012-10-30 – 2012-11-01 (×3): 40 mg via SUBCUTANEOUS
  Filled 2012-10-30 (×3): qty 0.4

## 2012-10-30 MED ORDER — ACETAMINOPHEN 650 MG RE SUPP: 650.0000 mg | RECTAL | Status: DC | PRN

## 2012-10-30 MED ORDER — ACETAMINOPHEN 325 MG PO TABS
650.0000 mg | ORAL_TABLET | ORAL | Status: DC | PRN
  Administered 2012-10-31: 650 mg via ORAL
  Filled 2012-10-30: qty 2

## 2012-10-30 MED ORDER — WHITE PETROLATUM GEL
Status: AC
  Administered 2012-10-30: 17:00:00
  Filled 2012-10-30: qty 5

## 2012-10-30 NOTE — Progress Notes (Signed)
Family Medicine Teaching Service Progress Note [phone]  Subjective: Patient more alert this morning. Doesn't recall most of the events last evening after getting ativan prior to her MRI. She states she feels ok this morning, hasn't gotten out of bed yet. Denies any weakness, numbness, speech difficulty.  She and her fiance ask appropriate questions. 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 States they are applying for disability and social security because she has been held from work for past 2 weeks.  Objective: Vital signs in last 24 hours: Temp:  [97.7 F (36.5 C)-98.3 F (36.8 C)] 98.2 F (36.8 C) (11/13 0827) Pulse Rate:  [81-91] 81  (11/13 0827) Resp:  [14-18] 18  (11/13 0827) BP: (120-167)/(81-120) 130/83 mmHg (11/13 0827) SpO2:  [98 %-100 %] 100 % (11/13 0827) Weight:  [181 lb 3.5 oz (82.2 kg)] 181 lb 3.5 oz (82.2 kg) (11/13 0232) Weight change:  Last BM Date: 10/29/12  Intake/Output from previous day: 11/12 0701 - 11/13 0700 In: 1000 [IV Piggyback:1000] Out: 450 [Urine:450] Intake/Output this shift:   GEN: NAD, lying flat HEENT: normal speech, PERRLA, EOMI. No Tenderness in temporal area. CV: RRR, no murmurs. PULM: CTAB EXT: no edema or tenderness. NEURO: CN II-XII intact, 5/5 strength bilateral LE and UE. Normal finger-nose testing. Sensation intact. No clonus. Speech normal. A/o x 3.  Lab Results:  [facility] 10/29/12 1603  WBC 4.0  HGB 13.1  HCT 37.5  PLT 257   BMET  Basename 10/29/12 1603  NA 135  K 3.6  CL 98  CO2 28  GLUCOSE 86  BUN 17  CREATININE 0.91  CALCIUM 9.6    Studies/Results: Dg Chest 2 View  10/29/2012  *RADIOLOGY REPORT*  Clinical Data: Weakness and dizziness  CHEST - 2 VIEW  Comparison: 10/18/2012  Findings: Mild cardiac enlargement.  No pleural effusion or edema. No air space consolidation.  Review of the visualized osseous structures is unremarkable.  IMPRESSION: No acute cardiopulmonary abnormalities.    Original Report Authenticated By: Signa Kell, M.D.    Mr Brain Wo Contrast  10/29/2012  *RADIOLOGY REPORT*  Clinical Data: 51 year old female with weakness, dizziness, nausea, chronic headaches, syncope.  MRI HEAD WITHOUT CONTRAST  Technique:  Multiplanar, multiecho pulse sequences of the brain and surrounding structures were obtained according to standard protocol without intravenous contrast.  Comparison: Head CT without contrast 10/18/2012.  Findings: Evidence of multiple punctate areas of restricted diffusion in the brain stem and superior left cerebellar hemisphere (see arrows on series 3 and series 300).  No associated T2 or FLAIR hyperintensity is identified.  No other diffusion abnormality identified. Major intracranial vascular flow voids are preserved.  Occasional small subcortical cerebral white matter foci of T2 and FLAIR hyperintensity.  No cortical encephalomalacia.  Deep gray matter nuclei are within normal limits.  Otherwise normal gray-white matter signal.  No ventriculomegaly. No midline shift, mass effect, or evidence of mass lesion.  No acute intracranial hemorrhage identified. Negative pituitary and cervicomedullary junction.  Age congruent degenerative changes in the visible cervical spine. Visualized bone marrow signal is within normal limits.  Visualized paranasal sinuses and mastoids are clear.  Visualized orbits and scalp soft tissues are within normal limits.  IMPRESSION: 1.  Suspect several tiny acute lacunar infarcts in the brain stem and left superior cerebellum.  No associated mass effect or hemorrhage. 2.  No other acute intracranial abnormality. 3. Minimal nonspecific subcortical white matter signal changes.   Original Report Authenticated By: Odessa Fleming III,  M.D.     Medications:  Scheduled:   . amLODipine  5 mg Oral Daily  . aspirin EC  325 mg Oral Daily  . citalopram  20 mg Oral Daily  . [COMPLETED] diphenhydrAMINE  25 mg Intravenous Once  . enoxaparin  40 mg  Subcutaneous Q24H  . lisinopril  20 mg Oral Daily   And  . hydrochlorothiazide  12.5 mg Oral Daily  . [COMPLETED] ketorolac  30 mg Intravenous Once  . [COMPLETED] LORazepam  1 mg Intravenous Once  . [COMPLETED] prochlorperazine  10 mg Intravenous STAT  . [COMPLETED] sodium chloride  1,000 mL Intravenous Once  . [DISCONTINUED] metoCLOPramide (REGLAN) injection  10 mg Intravenous Once  . [DISCONTINUED] quinapril-hydrochlorothiazide  1 tablet Oral Daily   EAV:[id number], acetaminophen, albuterol, ondansetron (ZOFRAN) IV, senna-docusate  Assessment/Plan: KIPPY MELENA is a 51 y.o. year old female with history of HTN and anxiety presenting with lightheadedness, worsened headaches x 2 weeks and concern for new lacunar infarcts on MRI.   1. Neurologic. MRI findings concerning for new lacunar infarcts. Neuro consulted in ED and recommend repeat MRA/MRI to further characterize the radiologic defect/lesion as the differential includes artifact and to evaluate her posterior circulation. If this was a stroke, she has no neurologic deficits today. Differential also includes atypical migraine, conversion DO. -hold home benzos (given ativan in ED)  -no need to repeat echo, carotid dopplers currently  -continue daily aspirin  -FLP, A1c pending -OT/PT, bedside swallow eval prior to diet.   2. HTN. Variable control recently. (155/111-126/85 in clinic notes)  -Cont home norvasc, ACEi/HCTZ in am.  -her metoprolol was discontinued in clinic with concern for orthostatic symptoms, may restart if necessary. Continue to hold to avoid underperfusion if this is acute CVA.   3. Anxiety.  -continue celexa.  -consider restart home med xanax if necessary.   4. Headache. History chronic headache, worsened currently. Could be part of her uncontrolled HTN or neurologic process. S/p compazine in ED.  -monitor neuro checks, if deficits develop then consider increased ICP.  -f/u MRI/MRA  -consider initiation of  prophylactic beta blocker if alternative etiologies not found.  5. FEN/GI: NPO until bedside swallow eval. NS at [facility] F/u BMET in am. No hypoglycemia.   6. Prophylaxis: SQ lovenox.   7. Disposition: Pending MRI results. Full code. Patient's fiance updated at bedside.   LOS: 1 day   Maria Griffith 10/30/2012, 9:16 AM
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
## 2012-10-30 NOTE — ED Notes (Signed)
Hospitalist at bedside 

## 2012-10-30 NOTE — H&P (Signed)
FMTS Attending Admission Note: Renold Don MD Personal pager:  [phone] FPTS Service Pager:  [phone]  I  have seen and examined this patient, reviewed their chart. I have discussed this patient with the resident. I agree with the resident's findings, assessment and care plan.  Additionally: - Patient somewhat tremulous and anxious this AM, concerned about MRI and the results of the scan - Neuro exam without any focal findings today.  Did express some anxiety about standing and feeling "lightheaded" when she stood.   - She is being taken down for repeat MRI/MRA, await results.  Favor anxiety component of her issues but would like to rule out intracranial infarcts, as per Neurology.  Greatly appreciate input.
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
## 2012-10-30 NOTE — Discharge Summary (Signed)
Family Medicine Teaching [facility] Discharge Summary  Patient name: Maria Griffith Medical record number: [id number] Date of birth: 03-29-1961 Age: 51 y.o. Gender: female Date of Admission: 10/29/2012  Date of Discharge: 11/01/12 Admitting Physician: Tobey Grim, MD  Primary Care Provider: [name], MD  Indication for Hospitalization: Near syncopal episode Discharge Diagnoses:  1. Complicated Migraine with Aura. Chronic 2. HTN. Stable 3. Anxiety. Stable  Consultations: Neurology, Stroke Team, PT/OT, Speech & Language Pathology  Significant Labs and Imaging:  1. CHEST - 2 VIEW   No acute cardiopulmonary abnormalities.  3. LIMITED MRI BRAIN WITHOUT CONTRAST  Comparison:10/29/2012 MR brain. 10/18/2012 CT.  MRI HEAD  IMPRESSION:  On thin section diffusion imaging centered at the posterior fossa  level, no definitive acute infarct is noted.   4. MRA HEAD WITHOUT CONTRASTIMPRESSION:  Left periophthalmic 4.6 mm aneurysm suspected.  Prominent infundibulum origin of the right posterior cerebral  artery and right anterior carotid artery as noted above.  Intracranial atherosclerotic type changes predominant involving  branch vessels as detailed above.  5. MRA NECK WITHOUT AND WITH CONTRAST IMPRESSION:  No evidence of hemodynamically significant stenosis involving  either carotid bifurcation or vertebral arteries. No definitive  findings of dissection.  Cervical spondylotic changes with various degrees of spinal  stenosis incompletely assessed on the present exam.  6. EKG: NSR, rate 84, normal axis, Probable anteroseptal infarct w/ old Q-waves >32ms (V1, V2). No acute ST segment or T wave changes. No significant changes compared to old EKG 09/03/11.  Significant Labs:  1. Lipid Panel     Component Value Date/Time   CHOL 159 10/30/2012 0623   TRIG 80 10/30/2012 0623   HDL 47 10/30/2012 0623   CHOLHDL 3.4 10/30/2012 0623   VLDL 16 10/30/2012 0623   LDLCALC 96 10/30/2012 0623   2. Hgb A1C: 5.5  3. Urinalysis    Component Value Date/Time   COLORURINE YELLOW 10/29/2012 1728   APPEARANCEUR HAZY* 10/29/2012 1728   LABSPEC 1.020 10/29/2012 1728   PHURINE 5.0 10/29/2012 1728   GLUCOSEU NEGATIVE 10/29/2012 1728   HGBUR TRACE* 10/29/2012 1728   BILIRUBINUR NEGATIVE 10/29/2012 1728   KETONESUR NEGATIVE 10/29/2012 1728   PROTEINUR NEGATIVE 10/29/2012 1728   UROBILINOGEN 0.2 10/29/2012 1728   NITRITE NEGATIVE 10/29/2012 1728   LEUKOCYTESUR NEGATIVE 10/29/2012 1728   Procedures: none  Brief Hospital Course:  Maria Griffith is a 51 y/o female with history of HTN, chronic migraine HA, and anxiety presenting with pre-syncopal episode of lightheadedness, dizziness, and generalized weakness and worsened chronic migraine headache x 2 weeks.  1. Chronic, Complicated Migraine w/ Aura. Presented with subacute onset of pre-syncope (started 10/18/12, with negative work-up, Head CT, cardiac CTA, ECHO). Lightheadedness, generalized weakness, and severe unilateral, throbbing headache w/ associated nausea and visual disturbances. No new focal neurological deficits. Received ativan and compazine in ED, initial MRI (11/12) findings concerning for new lacunar infarcts. Neuro consulted in ED and recommend repeat MRA/MRI to further characterize the radiologic defect/lesion, which showed no acute posterior infarct. Risk stratification w/ FLP (normal, see above), HgbA1C (5.5). Symptoms c/w refractory complicated migraine. Eval by PT/OT recs include supervision at home. Abortive migraine therapies tried included Tylenol, Flexeril, Toradol x1 provided some HA relief. Imitrex also with some relief, but decided to d/c due to side-effects of numbness/tingling and risk of coronary vasospasm pending her planned upcoming stress test. Initiation of Topiramate 25 BID for migraine prophylaxis and Prednisone DosePak to be continued on discharge. Discharge to home with f/u apts  at  [facility], [facility] (for stress test), and need to schedule apt with [facility] Neurology.   2. HTN. History consists of variable control (155/111-126/85 in clinic notes, recently d/c Metoprolol due to concern for orthostatic symptoms). On presentation BP was 164/120. Continued on home Amlodipine, ACEi/HCTZ. Due to concern for possible stroke, no aggressive management to reduce BP was made. BP continued to improve. Recommend outpt follow-up to consider re-starting a beta-blocker (such as Propanolol) for both BP control and would also provide benefit of Migraine HA prophylaxis.  3. Anxiety. History of anxiety, currently treated with Celexa 20mg  PO daily. She was treated with prn ativan for anxiety during hospitalization, and will continue since the SSRI is becoming therapeutic (started 10/18/12).   Discharge Medications:    Medication List     As of 11/01/2012  3:13 PM    STOP taking these medications         ALPRAZolam 0.25 MG tablet   Commonly known as: XANAX      TAKE these medications         albuterol 108 (90 BASE) MCG/ACT inhaler   Commonly known as: PROVENTIL HFA;VENTOLIN HFA   Inhale 2 puffs into the lungs every 6 (six) hours as needed. For wheezing      amLODipine 5 MG tablet   Commonly known as: NORVASC   Take 5 mg by mouth daily.      citalopram 20 MG tablet   Commonly known as: CELEXA   Take 20 mg by mouth daily.      cyclobenzaprine 10 MG tablet   Commonly known as: FLEXERIL   Take 10 mg by mouth 3 (three) times daily as needed. For muscle spasms      LORazepam 2 MG tablet   Commonly known as: ATIVAN   Take 0.5-1 tablets (1-2 mg total) by mouth every 6 (six) hours as needed for anxiety.      naproxen 500 MG tablet   Commonly known as: NAPROSYN   Take 1 tablet (500 mg total) by mouth daily as needed. Do not take every day.      predniSONE 10 MG tablet   Commonly known as: DELTASONE   Take three tablets PO x 1 day, then 2 tabs PO x 2 days, then 1 tab PO x  2 days.      promethazine 25 MG tablet   Commonly known as: PHENERGAN   Take 25 mg by mouth every 6 (six) hours as needed. For nausea      quinapril-hydrochlorothiazide 20-12.5 MG per tablet   Commonly known as: ACCURETIC   Take 1 tablet by mouth daily.      topiramate 25 MG tablet   Commonly known as: TOPAMAX   Take 1 tablet (25 mg total) by mouth 2 (two) times daily.        Issues for Follow Up: 1. Migraine control with Topiramate (prophylaxis), NSAID (abortive). Consider alternative prophylaxis: propanolol, TCA if not affective in few weeks span. 2. Consider re-starting Imitrex (abortive therapy), if Cardiology work-up negative. 3. Anxiety control on Celexa 4. Cardiac f/u for stress test with atypical chest pain last month. 5. Clearance for driving. Advised patient to avoid driving until follow up visit Monday since she is starting new medications that may cause drowsiness (occupation school bus driver).   Follow-up Apts: [facility] - 11/04/12 at 11:15am with Dr. Armen Pickup.  [facility] - 11/12/12 at 8:50am. (re-scheduled prior apt for eval possible stress test)  Outstanding Results: none  Discharge Condition: Stable.  Sofie Hartigan, Med Student 10/30/2012, 3:28 PM  I have seen and examined patient. Agree with above DC summary and edits have been made.   Lloyd Huger, MD PGY-3 11/01/12 15:02
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
## 2012-10-30 NOTE — Progress Notes (Signed)
FMTS Attending Daily Note:  Renold Don MD  [phone] pager  Family Practice pager:  [phone] I have seen and examined this patient and have reviewed their chart. I have discussed this patient with the resident. I agree with the resident's findings, assessment and care plan.  See HP
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
## 2012-10-30 NOTE — Telephone Encounter (Signed)
I would like for her to have
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
## 2012-10-30 NOTE — Assessment & Plan Note (Signed)
Start celexa take once daily after xanax for at least the first week. Continue xanax as needed with goal of not needing xanax chronically.

## 2012-10-30 NOTE — Progress Notes (Signed)
SLP received order for swallow and speech language evaluation.  As SLP arrived, pt was being taken for MRI.  Will return later for BSE and SLE as ordered.  Thanks.            Donavan Burnet, MS [facility] SLP               [phone]

## 2012-10-30 NOTE — Telephone Encounter (Signed)
Patient admitted for further work up.

## 2012-10-30 NOTE — Progress Notes (Signed)
PT Cancellation Note  Patient Details Name: LAVONA NORSWORTHY MRN: [id number] DOB: [birth year]   Cancelled Evaluation:     Pt is currently at MRI, will try back later this afternoon.   Fabio Asa 10/30/2012, 11:27 AM Charlotte Crumb, PT DPT  [phone]

## 2012-10-31 ENCOUNTER — Encounter (HOSPITAL_COMMUNITY)
Admission: EM | Disposition: A | Discharge status: Home / Self Care | Payer: Self-pay | Source: Home / Self Care | Attending: Family Medicine

## 2012-10-31 LAB — GLUCOSE, CAPILLARY
Glucose-Capillary: 97 mg/dL (ref 70–99)
Glucose-Capillary: 131 mg/dL — ABNORMAL HIGH (ref 70–99)

## 2012-10-31 SURGERY — ECHOCARDIOGRAM, TRANSESOPHAGEAL
Anesthesia: Moderate Sedation

## 2012-10-31 MED ORDER — PREDNISONE (PAK) 5 MG PO TABS
10.0000 mg | ORAL_TABLET | Freq: Every morning | ORAL | Status: AC
  Filled 2012-10-31: qty 21

## 2012-10-31 MED ORDER — PREDNISONE (PAK) 5 MG PO TABS
5.0000 mg | ORAL_TABLET | ORAL | Status: AC
  Administered 2012-10-31: 5 mg via ORAL

## 2012-10-31 MED ORDER — PREDNISONE (PAK) 5 MG PO TABS
5.0000 mg | ORAL_TABLET | Freq: Three times a day (TID) | ORAL | Status: DC
  Administered 2012-11-01 (×2): 5 mg via ORAL

## 2012-10-31 MED ORDER — LORAZEPAM 2 MG/ML IJ SOLN
INTRAMUSCULAR | Status: AC
  Filled 2012-10-31: qty 1

## 2012-10-31 MED ORDER — KETOROLAC TROMETHAMINE 30 MG/ML IJ SOLN
30.0000 mg | Freq: Once | INTRAMUSCULAR | Status: AC
  Administered 2012-10-31: 30 mg via INTRAVENOUS
  Filled 2012-10-31: qty 1

## 2012-10-31 MED ORDER — PREDNISONE (PAK) 10 MG PO TABS
10.0000 mg | ORAL_TABLET | ORAL | Status: DC
Start: 2012-10-31 — End: 2012-10-31
  Filled 2012-10-31: qty 21

## 2012-10-31 MED ORDER — LORAZEPAM 2 MG/ML IJ SOLN
INTRAMUSCULAR | Status: AC
  Administered 2012-10-31: 2 mg
  Filled 2012-10-31: qty 1

## 2012-10-31 MED ORDER — PREDNISONE (PAK) 5 MG PO TABS: 10.0000 mg | ORAL_TABLET | Freq: Every evening | ORAL | Status: DC

## 2012-10-31 MED ORDER — CYCLOBENZAPRINE HCL 5 MG PO TABS
5.0000 mg | ORAL_TABLET | Freq: Once | ORAL | Status: AC
  Administered 2012-10-31: 5 mg via ORAL
  Filled 2012-10-31: qty 1

## 2012-10-31 MED ORDER — TOPIRAMATE 25 MG PO TABS
25.0000 mg | ORAL_TABLET | Freq: Two times a day (BID) | ORAL | Status: DC
  Administered 2012-10-31 – 2012-11-01 (×3): 25 mg via ORAL
  Filled 2012-10-31 (×4): qty 1

## 2012-10-31 MED ORDER — PREDNISONE (PAK) 5 MG PO TABS: 5.0000 mg | ORAL_TABLET | Freq: Four times a day (QID) | ORAL | Status: DC

## 2012-10-31 MED ORDER — CYCLOBENZAPRINE HCL 10 MG PO TABS
5.0000 mg | ORAL_TABLET | Freq: Two times a day (BID) | ORAL | Status: DC | PRN
  Filled 2012-10-31: qty 2
  Filled 2012-10-31: qty 1

## 2012-10-31 MED ORDER — PREDNISONE (PAK) 5 MG PO TABS
10.0000 mg | ORAL_TABLET | Freq: Every evening | ORAL | Status: AC
  Administered 2012-10-31: 10 mg via ORAL

## 2012-10-31 MED ORDER — SUMATRIPTAN SUCCINATE 50 MG PO TABS
50.0000 mg | ORAL_TABLET | ORAL | Status: AC | PRN
  Administered 2012-10-31 – 2012-11-01 (×2): 50 mg via ORAL
  Filled 2012-10-31 (×3): qty 1
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
## 2012-10-31 NOTE — Progress Notes (Signed)
Family Medicine Teaching Service Progress Note [phone]  Subjective: Overnight Events: None  Patient is somnolent this morning. Woke up with same headache, severe (9/10 to 4/10), sharp, throbbing, L-frontal HA with associated nausea. Reports that this is the same HA she has experienced in this past, and that it tends to "come and go". Improves with rest, and some improvement with Tylenol. No improvements in past with Motrin or Aleve. Requested additional medication for pain this morning, will try Flexeril as she had previously been on it at home.   Denies any fever, chills, CP, SoB, weakness, numbness/tingling, vision or speech difficulty. +L-frontal HA, +nausea, +photophobia, +phonophobia, +fatigue  Objective: Vital signs in last 24 hours: Temp:  [97.1 F (36.2 C)-98.6 F (37 C)] 98.6 F (37 C) (11/14 0800) Pulse Rate:  [80-90] 80  (11/14 0800) Resp:  [16-18] 16  (11/14 0800) BP: (129-145)/(76-86) 129/76 mmHg (11/14 0800) SpO2:  [99 %-100 %] 100 % (11/14 0800) Weight change:  Last BM Date: 10/29/12  Intake/Output from previous day:   Intake/Output this shift:    General appearance: cooperative and no distress HEENT: PERRLA, EOMI, normal speech. Neck: no adenopathy, no carotid bruit, supple, symmetrical, trachea midline and thyroid not enlarged, symmetric, no tenderness/mass/nodules Lungs: clear to auscultation bilaterally Heart: regular rate and rhythm, S1, S2 normal, no murmur, click, rub or gallop Extremities: extremities normal, atraumatic, no cyanosis or edema Pulses: 2+ and symmetric Skin: Skin color, texture, turgor normal. No rashes or lesions Neurologic: AAOx3, CN II-XII grossly intact, 5/5 muscle str BL UE, LE. Intact sensation. Gait not tested.  Lab Results:  [facility] 10/29/12 1603  WBC 4.0  HGB 13.1  HCT 37.5  PLT 257   BMET  Basename 10/29/12 1603  NA 135  K 3.6  CL 98  CO2 28  GLUCOSE 86  BUN 17  CREATININE 0.91  CALCIUM 9.6     Studies/Results: Dg Chest 2 View  10/29/2012  *RADIOLOGY REPORT*  Clinical Data: Weakness and dizziness  CHEST - 2 VIEW  Comparison: 10/18/2012  Findings: Mild cardiac enlargement.  No pleural effusion or edema. No air space consolidation.  Review of the visualized osseous structures is unremarkable.  IMPRESSION: No acute cardiopulmonary abnormalities.   Original Report Authenticated By: Signa Kell, M.D.    Mr [facility] Wo Contrast  10/30/2012  *RADIOLOGY REPORT*  Clinical Data:  Weakness and dizziness with nausea.  Chronic headaches and syncope.  Hypertension.  Possible stroke.  LIMITED MRI BRAIN WITHOUT CONTRAST MRA HEAD WITHOUT CONTRAST MRA NECK WITHOUT AND WITH CONTRAST  Technique:  Limited MR of the brain with thin section diffusion imaging was performed to evaluate for acute infarct.  Angiographic images of the Circle of Willis were obtained using MRA technique without intravenous contrast.  Angiographic images of the neck were obtained using MRA technique without and with intravenous contrast.  Contrast:17 ml MultiHance.  Comparison:10/29/2012 MR brain.  10/18/2012 CT.  MRI HEAD  Findings: On thin section diffusion imaging centered at the posterior fossa level, no definitive acute infarct is noted.  Cervical spondylotic changes noted on 10/29/2012 sagittal T1 sequence are not assessed on the present exam.  IMPRESSION: On thin section diffusion imaging centered at the posterior fossa level, no definitive acute infarct is noted.  MRA HEAD  Findings:Left periophthalmic 4.6 mm aneurysm suspected.  Prominent infundibulum at the origin of the right posterior cerebral artery and right anterior choroidal artery.  Stability can be confirmed on follow-up or evaluated at the time of angiography if performed for the  left periophthalmic artery aneurysm.  Mild to slightly moderate narrowing proximal A2 segments right anterior cerebral artery.  Middle cerebral artery mild to moderate branch vessel  irregularity more notable on the right.  Mild irregularity of the vertebral arteries and basilar artery without high-grade stenosis.  Nonvisualization AICAs.  Slightly bulbous appearance of the basilar tip without discrete saccular aneurysm.  Fetal type origin of the right posterior cerebral artery.  Mild irregularity and narrowing of the posterior cerebral arteries bilaterally more notable on the right and most prominent involving distal branches.  IMPRESSION: Left periophthalmic 4.6 mm aneurysm suspected.  Prominent infundibulum origin of the right posterior cerebral artery and right anterior carotid artery as noted above.  Intracranial atherosclerotic type changes predominant involving branch vessels as detailed above.  MRA NECK  Findings:Normal configuration of the origin of the great vessels from the aortic arch.  Slight loss of signal proximal right common carotid artery may be related to artifact.  Right carotid bifurcation without significant narrowing.  Mild ectasia with fold and slight narrowing right internal carotid artery 3 cm above its origin.  Left common carotid artery unremarkable.  Slight narrowing proximal left internal carotid artery without hemodynamically significant stenosis.  No significant narrowing of the subclavian arteries proximal to the takeoff of the vertebral arteries.  Left vertebral artery is dominant in size.  Ectatic vertebral arteries which appears slightly irregular but without definitive findings of dissection.  IMPRESSION: No evidence of hemodynamically significant stenosis involving either carotid bifurcation or vertebral arteries.  No definitive findings of dissection.  Cervical spondylotic changes with various degrees of spinal stenosis incompletely assessed on the present exam.  This has been made a PRA call report utilizing dashboard call feature.   Original Report Authenticated By: Lacy Duverney, M.D.    Mr Angiogram Neck W Wo Contrast  10/30/2012  *RADIOLOGY REPORT*   Clinical Data:  Weakness and dizziness with nausea.  Chronic headaches and syncope.  Hypertension.  Possible stroke.  LIMITED MRI BRAIN WITHOUT CONTRAST MRA HEAD WITHOUT CONTRAST MRA NECK WITHOUT AND WITH CONTRAST  Technique:  Limited MR of the brain with thin section diffusion imaging was performed to evaluate for acute infarct.  Angiographic images of the Circle of Willis were obtained using MRA technique without intravenous contrast.  Angiographic images of the neck were obtained using MRA technique without and with intravenous contrast.  Contrast:17 ml MultiHance.  Comparison:10/29/2012 MR brain.  10/18/2012 CT.  MRI HEAD  Findings: On thin section diffusion imaging centered at the posterior fossa level, no definitive acute infarct is noted.  Cervical spondylotic changes noted on 10/29/2012 sagittal T1 sequence are not assessed on the present exam.  IMPRESSION: On thin section diffusion imaging centered at the posterior fossa level, no definitive acute infarct is noted.  MRA HEAD  Findings:Left periophthalmic 4.6 mm aneurysm suspected.  Prominent infundibulum at the origin of the right posterior cerebral artery and right anterior choroidal artery.  Stability can be confirmed on follow-up or evaluated at the time of angiography if performed for the left periophthalmic artery aneurysm.  Mild to slightly moderate narrowing proximal A2 segments right anterior cerebral artery.  Middle cerebral artery mild to moderate branch vessel irregularity more notable on the right.  Mild irregularity of the vertebral arteries and basilar artery without high-grade stenosis.  Nonvisualization AICAs.  Slightly bulbous appearance of the basilar tip without discrete saccular aneurysm.  Fetal type origin of the right posterior cerebral artery.  Mild irregularity and narrowing of the posterior cerebral arteries bilaterally more  notable on the right and most prominent involving distal branches.  IMPRESSION: Left periophthalmic 4.6 mm  aneurysm suspected.  Prominent infundibulum origin of the right posterior cerebral artery and right anterior carotid artery as noted above.  Intracranial atherosclerotic type changes predominant involving branch vessels as detailed above.  MRA NECK  Findings:Normal configuration of the origin of the great vessels from the aortic arch.  Slight loss of signal proximal right common carotid artery may be related to artifact.  Right carotid bifurcation without significant narrowing.  Mild ectasia with fold and slight narrowing right internal carotid artery 3 cm above its origin.  Left common carotid artery unremarkable.  Slight narrowing proximal left internal carotid artery without hemodynamically significant stenosis.  No significant narrowing of the subclavian arteries proximal to the takeoff of the vertebral arteries.  Left vertebral artery is dominant in size.  Ectatic vertebral arteries which appears slightly irregular but without definitive findings of dissection.  IMPRESSION: No evidence of hemodynamically significant stenosis involving either carotid bifurcation or vertebral arteries.  No definitive findings of dissection.  Cervical spondylotic changes with various degrees of spinal stenosis incompletely assessed on the present exam.  This has been made a PRA call report utilizing dashboard call feature.   Original Report Authenticated By: Lacy Duverney, M.D.    Mr Brain Wo Contrast  10/29/2012  *RADIOLOGY REPORT*  Clinical Data: 51 year old female with weakness, dizziness, nausea, chronic headaches, syncope.  MRI HEAD WITHOUT CONTRAST  Technique:  Multiplanar, multiecho pulse sequences of the brain and surrounding structures were obtained according to standard protocol without intravenous contrast.  Comparison: Head CT without contrast 10/18/2012.  Findings: Evidence of multiple punctate areas of restricted diffusion in the brain stem and superior left cerebellar hemisphere (see arrows on series 3 and series  300).  No associated T2 or FLAIR hyperintensity is identified.  No other diffusion abnormality identified. Major intracranial vascular flow voids are preserved.  Occasional small subcortical cerebral white matter foci of T2 and FLAIR hyperintensity.  No cortical encephalomalacia.  Deep gray matter nuclei are within normal limits.  Otherwise normal gray-white matter signal.  No ventriculomegaly. No midline shift, mass effect, or evidence of mass lesion.  No acute intracranial hemorrhage identified. Negative pituitary and cervicomedullary junction.  Age congruent degenerative changes in the visible cervical spine. Visualized bone marrow signal is within normal limits.  Visualized paranasal sinuses and mastoids are clear.  Visualized orbits and scalp soft tissues are within normal limits.  IMPRESSION: 1.  Suspect several tiny acute lacunar infarcts in the brain stem and left superior cerebellum.  No associated mass effect or hemorrhage. 2.  No other acute intracranial abnormality. 3. Minimal nonspecific subcortical white matter signal changes.   Original Report Authenticated By: Erskine Speed, M.D.    Mr Brain Ltd W/o Cm  10/30/2012  *RADIOLOGY REPORT*  Clinical Data:  Weakness and dizziness with nausea.  Chronic headaches and syncope.  Hypertension.  Possible stroke.  LIMITED MRI BRAIN WITHOUT CONTRAST MRA HEAD WITHOUT CONTRAST MRA NECK WITHOUT AND WITH CONTRAST  Technique:  Limited MR of the brain with thin section diffusion imaging was performed to evaluate for acute infarct.  Angiographic images of the Circle of Willis were obtained using MRA technique without intravenous contrast.  Angiographic images of the neck were obtained using MRA technique without and with intravenous contrast.  Contrast:17 ml MultiHance.  Comparison:10/29/2012 MR brain.  10/18/2012 CT.  MRI HEAD  Findings: On thin section diffusion imaging centered at the posterior fossa level, no definitive  acute infarct is noted.  Cervical spondylotic  changes noted on 10/29/2012 sagittal T1 sequence are not assessed on the present exam.  IMPRESSION: On thin section diffusion imaging centered at the posterior fossa level, no definitive acute infarct is noted.  MRA HEAD  Findings:Left periophthalmic 4.6 mm aneurysm suspected.  Prominent infundibulum at the origin of the right posterior cerebral artery and right anterior choroidal artery.  Stability can be confirmed on follow-up or evaluated at the time of angiography if performed for the left periophthalmic artery aneurysm.  Mild to slightly moderate narrowing proximal A2 segments right anterior cerebral artery.  Middle cerebral artery mild to moderate branch vessel irregularity more notable on the right.  Mild irregularity of the vertebral arteries and basilar artery without high-grade stenosis.  Nonvisualization AICAs.  Slightly bulbous appearance of the basilar tip without discrete saccular aneurysm.  Fetal type origin of the right posterior cerebral artery.  Mild irregularity and narrowing of the posterior cerebral arteries bilaterally more notable on the right and most prominent involving distal branches.  IMPRESSION: Left periophthalmic 4.6 mm aneurysm suspected.  Prominent infundibulum origin of the right posterior cerebral artery and right anterior carotid artery as noted above.  Intracranial atherosclerotic type changes predominant involving branch vessels as detailed above.  MRA NECK  Findings:Normal configuration of the origin of the great vessels from the aortic arch.  Slight loss of signal proximal right common carotid artery may be related to artifact.  Right carotid bifurcation without significant narrowing.  Mild ectasia with fold and slight narrowing right internal carotid artery 3 cm above its origin.  Left common carotid artery unremarkable.  Slight narrowing proximal left internal carotid artery without hemodynamically significant stenosis.  No significant narrowing of the subclavian arteries  proximal to the takeoff of the vertebral arteries.  Left vertebral artery is dominant in size.  Ectatic vertebral arteries which appears slightly irregular but without definitive findings of dissection.  IMPRESSION: No evidence of hemodynamically significant stenosis involving either carotid bifurcation or vertebral arteries.  No definitive findings of dissection.  Cervical spondylotic changes with various degrees of spinal stenosis incompletely assessed on the present exam.  This has been made a PRA call report utilizing dashboard call feature.   Original Report Authenticated By: Lacy Duverney, M.D.     Medications:  Scheduled:    . amLODipine  5 mg Oral Daily  . aspirin EC  325 mg Oral Daily  . citalopram  20 mg Oral Daily  . cyclobenzaprine  5 mg Oral Once  . enoxaparin  40 mg Subcutaneous Q24H  . lisinopril  20 mg Oral Daily   And  . hydrochlorothiazide  12.5 mg Oral Daily  . [COMPLETED] white petrolatum       WUJ:[id number], acetaminophen, albuterol, cyclobenzaprine, [COMPLETED] gadobenate dimeglumine, ondansetron (ZOFRAN) IV, senna-docusate  Assessment/Plan:  Maria Griffith is a 51 y/o female with history of HTN, chronic migraine HA, and anxiety presenting with pre-syncopal episode of lightheadedness, dizziness, and generalized weakness. Repeat MRI showed no acute infarcts.  1. Neurologic / Near syncopal episode. (POA) - Unlikely acute CVA vs. Possible complex migraine HA vs. Possible anxiety/conversion disorder  - Hx recent work-up in ED (10/18/12) w/ negative: Head CT, Cardiac CTA, ECHO). - initial MRI in ED concerning for new tiny lacunar infarcts. Repeat MRI showed no acute infarct. No focal neuro deficits on exam. - Risk stratification: Lipid panel (normal), HgbA1C (5.5) - ASA 325 daily - PT/OT, Speech/Lang path evaluated. No further recs - f/u Neurology recs:  -  consider c/s Cardiology to inquire if would like to pursue inpatient vs. Outpatient stress test  2. HTN.  Variable control recently, w/ hx of uncontrolled BP. (155/111-126/85 in clinic notes)  - Improved, Stable. Last BP 129/76 - Cont home norvasc, ACEi/HCTZ in am.  -her metoprolol was discontinued in clinic with concern for orthostatic symptoms, may restart if necessary.  3. Anxiety. - Stable.  -continue celexa. (started on 11/7, needs more time to determine if effective, f/u outpt) - consider restart home med xanax if necessary.   4. Headache. History chronic headache (+2 years), worsened x2 weeks. Could be part of her uncontrolled HTN or neurologic process. S/p compazine in ED. Likely migraine HA, unilateral, severe, throbbing, w/ associated nausea, photo/phonophobia - Stable, unchanged HA. -  MRI/MRA showed no acute infarcts (see above, Neurologic) - Tylenol 650mg  PRN. Consider trying Toradol x1 injection for pain. - Flexeril 5mg  PO x1. Was previously on at home, unsure if helps. Consider re-starting - consider starting Imitrex to abort likely migraine headache -consider initiation of prophylactic beta blocker vs. Topiramate  5. FEN/GI: NPO until bedside swallow eval. NS at [facility]. F/u BMET in am. No hypoglycemia.   6. Prophylaxis: SQ lovenox.   7. Disposition: Pending TEE results, further recs per Neuro. Consider inpatient vs. Outpatient cardiology work-up for possible stress test. Currently stable, pending negative work-up likely discharge to home 1-2 days. Full code. Patient's fiance updated at bedside.   LOS: 2 days   KARAMALEGOS, ALEX 10/31/2012, 8:56 AM  I have seen and evaluated patient with MS4. Please see my note for further assessment/plans. Lloyd Huger, MD [facility] Resident - PGY-3 10/31/2012 9:43 AM

## 2012-10-31 NOTE — Progress Notes (Signed)
Family Medicine Teaching Service Progress Note [id number]  Subjective: I have seen and examined pt with MS4, agree with his subjective findings.   Main complaint is headache today. They are unhappy that no "pain meds" were given, but she was given antiemetic and tylenol.   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 States they are applying for disability and social security because she has been held from work for past 2 weeks.  Objective: Vital signs in last 24 hours: Temp:  [97.1 F (36.2 C)-98.6 F (37 C)] 98.6 F (37 C) (11/14 0800) Pulse Rate:  [80-90] 80  (11/14 0800) Resp:  [16-18] 16  (11/14 0800) BP: (129-145)/(76-86) 129/76 mmHg (11/14 0800) SpO2:  [99 %-100 %] 100 % (11/14 0800) Weight change:  Last BM Date: 10/29/12  Intake/Output from previous day:   Intake/Output this shift:   GEN: NAD, lying flat. Lights off in room.  HEENT: normal speech, PERRLA, EOMI. No Tenderness in temporal area. CV: RRR, no murmurs. PULM: CTAB EXT: no edema or tenderness. NEURO: CN II-XII intact, 5/5 strength bilateral LE and UE. Normal finger-nose testing. Sensation intact. No clonus. Speech normal. A/o x 3.  Lab Results:  [facility] 10/29/12 1603  WBC 4.0  HGB 13.1  HCT 37.5  PLT 257   BMET  Basename 10/29/12 1603  NA 135  K 3.6  CL 98  CO2 28  GLUCOSE 86  BUN 17  CREATININE 0.91  CALCIUM 9.6    Studies/Results: Dg Chest 2 View  10/29/2012  *RADIOLOGY REPORT*  Clinical Data: Weakness and dizziness  CHEST - 2 VIEW  Comparison: 10/18/2012  Findings: Mild cardiac enlargement.  No pleural effusion or edema. No air space consolidation.  Review of the visualized osseous structures is unremarkable.  IMPRESSION: No acute cardiopulmonary abnormalities.   Original Report Authenticated By: Signa Kell, M.D.    Mr [facility] Wo Contrast  10/30/2012  *RADIOLOGY REPORT*  Clinical Data:  Weakness and dizziness with nausea.  Chronic headaches and syncope.   Hypertension.  Possible stroke.  LIMITED MRI BRAIN WITHOUT CONTRAST MRA HEAD WITHOUT CONTRAST MRA NECK WITHOUT AND WITH CONTRAST  Technique:  Limited MR of the brain with thin section diffusion imaging was performed to evaluate for acute infarct.  Angiographic images of the Circle of Willis were obtained using MRA technique without intravenous contrast.  Angiographic images of the neck were obtained using MRA technique without and with intravenous contrast.  Contrast:17 ml MultiHance.  Comparison:10/29/2012 MR brain.  10/18/2012 CT.  MRI HEAD  Findings: On thin section diffusion imaging centered at the posterior fossa level, no definitive acute infarct is noted.  Cervical spondylotic changes noted on 10/29/2012 sagittal T1 sequence are not assessed on the present exam.  IMPRESSION: On thin section diffusion imaging centered at the posterior fossa level, no definitive acute infarct is noted.  MRA HEAD  Findings:Left periophthalmic 4.6 mm aneurysm suspected.  Prominent infundibulum at the origin of the right posterior cerebral artery and right anterior choroidal artery.  Stability can be confirmed on follow-up or evaluated at the time of angiography if performed for the left periophthalmic artery aneurysm.  Mild to slightly moderate narrowing proximal A2 segments right anterior cerebral artery.  Middle cerebral artery mild to moderate branch vessel irregularity more notable on the right.  Mild irregularity of the vertebral arteries and basilar artery without high-grade stenosis.  Nonvisualization AICAs.  Slightly bulbous appearance of the basilar tip without discrete saccular aneurysm.  Fetal type origin of  the right posterior cerebral artery.  Mild irregularity and narrowing of the posterior cerebral arteries bilaterally more notable on the right and most prominent involving distal branches.  IMPRESSION: Left periophthalmic 4.6 mm aneurysm suspected.  Prominent infundibulum origin of the right posterior cerebral  artery and right anterior carotid artery as noted above.  Intracranial atherosclerotic type changes predominant involving branch vessels as detailed above.  MRA NECK  Findings:Normal configuration of the origin of the great vessels from the aortic arch.  Slight loss of signal proximal right common carotid artery may be related to artifact.  Right carotid bifurcation without significant narrowing.  Mild ectasia with fold and slight narrowing right internal carotid artery 3 cm above its origin.  Left common carotid artery unremarkable.  Slight narrowing proximal left internal carotid artery without hemodynamically significant stenosis.  No significant narrowing of the subclavian arteries proximal to the takeoff of the vertebral arteries.  Left vertebral artery is dominant in size.  Ectatic vertebral arteries which appears slightly irregular but without definitive findings of dissection.  IMPRESSION: No evidence of hemodynamically significant stenosis involving either carotid bifurcation or vertebral arteries.  No definitive findings of dissection.  Cervical spondylotic changes with various degrees of spinal stenosis incompletely assessed on the present exam.  This has been made a PRA call report utilizing dashboard call feature.   Original Report Authenticated By: Lacy Duverney, M.D.    Mr Angiogram Neck W Wo Contrast  10/30/2012  *RADIOLOGY REPORT*  Clinical Data:  Weakness and dizziness with nausea.  Chronic headaches and syncope.  Hypertension.  Possible stroke.  LIMITED MRI BRAIN WITHOUT CONTRAST MRA HEAD WITHOUT CONTRAST MRA NECK WITHOUT AND WITH CONTRAST  Technique:  Limited MR of the brain with thin section diffusion imaging was performed to evaluate for acute infarct.  Angiographic images of the Circle of Willis were obtained using MRA technique without intravenous contrast.  Angiographic images of the neck were obtained using MRA technique without and with intravenous contrast.  Contrast:17 ml MultiHance.   Comparison:10/29/2012 MR brain.  10/18/2012 CT.  MRI HEAD  Findings: On thin section diffusion imaging centered at the posterior fossa level, no definitive acute infarct is noted.  Cervical spondylotic changes noted on 10/29/2012 sagittal T1 sequence are not assessed on the present exam.  IMPRESSION: On thin section diffusion imaging centered at the posterior fossa level, no definitive acute infarct is noted.  MRA HEAD  Findings:Left periophthalmic 4.6 mm aneurysm suspected.  Prominent infundibulum at the origin of the right posterior cerebral artery and right anterior choroidal artery.  Stability can be confirmed on follow-up or evaluated at the time of angiography if performed for the left periophthalmic artery aneurysm.  Mild to slightly moderate narrowing proximal A2 segments right anterior cerebral artery.  Middle cerebral artery mild to moderate branch vessel irregularity more notable on the right.  Mild irregularity of the vertebral arteries and basilar artery without high-grade stenosis.  Nonvisualization AICAs.  Slightly bulbous appearance of the basilar tip without discrete saccular aneurysm.  Fetal type origin of the right posterior cerebral artery.  Mild irregularity and narrowing of the posterior cerebral arteries bilaterally more notable on the right and most prominent involving distal branches.  IMPRESSION: Left periophthalmic 4.6 mm aneurysm suspected.  Prominent infundibulum origin of the right posterior cerebral artery and right anterior carotid artery as noted above.  Intracranial atherosclerotic type changes predominant involving branch vessels as detailed above.  MRA NECK  Findings:Normal configuration of the origin of the great vessels from the aortic arch.  Slight loss of signal proximal right common carotid artery may be related to artifact.  Right carotid bifurcation without significant narrowing.  Mild ectasia with fold and slight narrowing right internal carotid artery 3 cm above its  origin.  Left common carotid artery unremarkable.  Slight narrowing proximal left internal carotid artery without hemodynamically significant stenosis.  No significant narrowing of the subclavian arteries proximal to the takeoff of the vertebral arteries.  Left vertebral artery is dominant in size.  Ectatic vertebral arteries which appears slightly irregular but without definitive findings of dissection.  IMPRESSION: No evidence of hemodynamically significant stenosis involving either carotid bifurcation or vertebral arteries.  No definitive findings of dissection.  Cervical spondylotic changes with various degrees of spinal stenosis incompletely assessed on the present exam.  This has been made a PRA call report utilizing dashboard call feature.   Original Report Authenticated By: Lacy Duverney, M.D.    Mr Brain Wo Contrast  10/29/2012  *RADIOLOGY REPORT*  Clinical Data: 51 year old female with weakness, dizziness, nausea, chronic headaches, syncope.  MRI HEAD WITHOUT CONTRAST  Technique:  Multiplanar, multiecho pulse sequences of the brain and surrounding structures were obtained according to standard protocol without intravenous contrast.  Comparison: Head CT without contrast 10/18/2012.  Findings: Evidence of multiple punctate areas of restricted diffusion in the brain stem and superior left cerebellar hemisphere (see arrows on series 3 and series 300).  No associated T2 or FLAIR hyperintensity is identified.  No other diffusion abnormality identified. Major intracranial vascular flow voids are preserved.  Occasional small subcortical cerebral white matter foci of T2 and FLAIR hyperintensity.  No cortical encephalomalacia.  Deep gray matter nuclei are within normal limits.  Otherwise normal gray-white matter signal.  No ventriculomegaly. No midline shift, mass effect, or evidence of mass lesion.  No acute intracranial hemorrhage identified. Negative pituitary and cervicomedullary junction.  Age congruent  degenerative changes in the visible cervical spine. Visualized bone marrow signal is within normal limits.  Visualized paranasal sinuses and mastoids are clear.  Visualized orbits and scalp soft tissues are within normal limits.  IMPRESSION: 1.  Suspect several tiny acute lacunar infarcts in the brain stem and left superior cerebellum.  No associated mass effect or hemorrhage. 2.  No other acute intracranial abnormality. 3. Minimal nonspecific subcortical white matter signal changes.   Original Report Authenticated By: Erskine Speed, M.D.    Mr Brain Ltd W/o Cm  10/30/2012  *RADIOLOGY REPORT*  Clinical Data:  Weakness and dizziness with nausea.  Chronic headaches and syncope.  Hypertension.  Possible stroke.  LIMITED MRI BRAIN WITHOUT CONTRAST MRA HEAD WITHOUT CONTRAST MRA NECK WITHOUT AND WITH CONTRAST  Technique:  Limited MR of the brain with thin section diffusion imaging was performed to evaluate for acute infarct.  Angiographic images of the Circle of Willis were obtained using MRA technique without intravenous contrast.  Angiographic images of the neck were obtained using MRA technique without and with intravenous contrast.  Contrast:17 ml MultiHance.  Comparison:10/29/2012 MR brain.  10/18/2012 CT.  MRI HEAD  Findings: On thin section diffusion imaging centered at the posterior fossa level, no definitive acute infarct is noted.  Cervical spondylotic changes noted on 10/29/2012 sagittal T1 sequence are not assessed on the present exam.  IMPRESSION: On thin section diffusion imaging centered at the posterior fossa level, no definitive acute infarct is noted.  MRA HEAD  Findings:Left periophthalmic 4.6 mm aneurysm suspected.  Prominent infundibulum at the origin of the right posterior cerebral artery and right anterior choroidal  artery.  Stability can be confirmed on follow-up or evaluated at the time of angiography if performed for the left periophthalmic artery aneurysm.  Mild to slightly moderate narrowing  proximal A2 segments right anterior cerebral artery.  Middle cerebral artery mild to moderate branch vessel irregularity more notable on the right.  Mild irregularity of the vertebral arteries and basilar artery without high-grade stenosis.  Nonvisualization AICAs.  Slightly bulbous appearance of the basilar tip without discrete saccular aneurysm.  Fetal type origin of the right posterior cerebral artery.  Mild irregularity and narrowing of the posterior cerebral arteries bilaterally more notable on the right and most prominent involving distal branches.  IMPRESSION: Left periophthalmic 4.6 mm aneurysm suspected.  Prominent infundibulum origin of the right posterior cerebral artery and right anterior carotid artery as noted above.  Intracranial atherosclerotic type changes predominant involving branch vessels as detailed above.  MRA NECK  Findings:Normal configuration of the origin of the great vessels from the aortic arch.  Slight loss of signal proximal right common carotid artery may be related to artifact.  Right carotid bifurcation without significant narrowing.  Mild ectasia with fold and slight narrowing right internal carotid artery 3 cm above its origin.  Left common carotid artery unremarkable.  Slight narrowing proximal left internal carotid artery without hemodynamically significant stenosis.  No significant narrowing of the subclavian arteries proximal to the takeoff of the vertebral arteries.  Left vertebral artery is dominant in size.  Ectatic vertebral arteries which appears slightly irregular but without definitive findings of dissection.  IMPRESSION: No evidence of hemodynamically significant stenosis involving either carotid bifurcation or vertebral arteries.  No definitive findings of dissection.  Cervical spondylotic changes with various degrees of spinal stenosis incompletely assessed on the present exam.  This has been made a PRA call report utilizing dashboard call feature.   Original Report  Authenticated By: Lacy Duverney, M.D.     Medications:  Scheduled:    . amLODipine  5 mg Oral Daily  . aspirin EC  325 mg Oral Daily  . citalopram  20 mg Oral Daily  . cyclobenzaprine  5 mg Oral Once  . enoxaparin  40 mg Subcutaneous Q24H  . lisinopril  20 mg Oral Daily   And  . hydrochlorothiazide  12.5 mg Oral Daily  . [COMPLETED] white petrolatum       ZOX:[id number], acetaminophen, albuterol, cyclobenzaprine, [COMPLETED] gadobenate dimeglumine, ondansetron (ZOFRAN) IV, senna-docusate  Assessment/Plan: JUANNA PUDLO is a 51 y.o. year old female with history of HTN and anxiety presenting with lightheadedness, worsened headaches x 2 weeks.   1. Neurologic. Repeat MRI does not show acute posterior infarct. Differential also includes atypical migraines, conversion DO. Today the migraines seem most likely.   -f/u neuro recommendations, may go for TEE today.  -no need to repeat echo or carotid dopplers currently  -continue daily aspirin  -FLP, A1c within range -OT/PT eval.   2. HTN. Variable control recently. (155/111-126/85 in clinic notes)  -Cont home norvasc, ACEi/HCTZ in am.  -her metoprolol was discontinued in clinic with concern for orthostatic symptoms, may restart if necessary.    3. Anxiety.  -continue celexa.  -consider restart home med xanax if necessary.   4. Chronic Headache, deteriorated. History chronic headache, that is unilateral, throbbing, associated with nausea and photophobia, worsened currently. C/w migraines in absence of anatomical process. -she requests flexeril (home med) -consider imitrex or toradol today if BP remains controlled and if stroke is ruled out. -consider initiation of prophylactic (TCA or topomax or  propranolol) at DC or as outpatient.  5. FEN/GI: Reg diet. NS at [facility]. F/u BMET in am. No hypoglycemia.   6. Prophylaxis: SQ lovenox.   7. Disposition: Full code. Patient's fiance updated at bedside.   LOS: 2 days   Lloyd Huger  [phone] 10/31/2012, 9:27 AM

## 2012-10-31 NOTE — Progress Notes (Signed)
FMTS Attending Daily Note:  Maria Don MD  [phone] pager  Family Practice pager:  [phone] I have seen and examined this patient and have reviewed their chart. I have discussed this patient with the resident. I agree with the resident's findings, assessment and care plan.  Appreciate Neurology input.  Her main concern is headache.   Agree with canceling TEE.  No signs of stroke on MRI.   Treat as atypical migraine.   Likely able to DC home tomorrow

## 2012-10-31 NOTE — Evaluation (Signed)
Physical Therapy Evaluation Patient Details Name: Maria Griffith MRN: [id number] DOB: Dec 25, 1960 Today's Date: 10/31/2012 Time: 6578-4696 PT Time Calculation (min): 39 min  PT Assessment / Plan / Recommendation Clinical Impression  51 yo female admitted with syncope and headache and found to have possible tiny lacunar infarcts but repeat MRI did not show anything so this was ruled out. Neurology feels it is either complex migraines or anxiety/conversion disorder. Will benefit from acute PT to address deficits in functional mobility secondary to decreased endurance.       PT Assessment  Patient needs continued PT services    Follow Up Recommendations  No PT follow up          Equipment Recommendations  Tub/shower seat (with back)       Frequency Min 3X/week    Precautions / Restrictions Precautions Precautions: Fall Restrictions Weight Bearing Restrictions: No   Pertinent Vitals/Pain 5/10 headache      Mobility  Bed Mobility Details for Bed Mobility Assistance: Not tested, pt was in bathroom upon arrival and when we left we left her in recliner Transfers Transfers: Sit to Stand;Stand to Sit Sit to Stand: 4: Min guard;With upper extremity assist;From bed Stand to Sit: 4: Min guard;With upper extremity assist;To bed Ambulation/Gait Ambulation/Gait Assistance: 4: Min guard;5: Supervision Ambulation Distance (Feet): 70 Feet Assistive device: None Ambulation/Gait Assistance Details: VC's for looking upright and increasing cadence Gait Pattern: Step-through pattern;Decreased stride length Gait velocity: decreased General Gait Details: pt mildly unsteady but no noted LOB; pt did fatigue and appeared to develop some SOB  Stairs: No       Exercises Other Exercises Other Exercises: Pt states that her right arm is swollen and indeed it is compared to the left arm. Made MD getting ready to go in and see her aware as well as awareness that pt presented with DOE to extent  that she had trouble speaking while we were ambulating and post getting  back to her room. Vitals were checked with BP 140/86 HR 84 and Sats on RA 84   PT Diagnosis: Generalized weakness  PT Problem List: Decreased strength;Decreased activity tolerance;Decreased balance;Pain;Decreased mobility PT Treatment Interventions: Gait training;Stair training;Functional mobility training;Therapeutic activities;Therapeutic exercise;Patient/family education   PT Goals Acute Rehab PT Goals PT Goal Formulation: With patient Time For Goal Achievement: 10/31/12 Potential to Achieve Goals: Good Pt will go Sit to Stand: with modified independence PT Goal: Sit to Stand - Progress: Goal set today Pt will go Stand to Sit: with modified independence PT Goal: Stand to Sit - Progress: Goal set today Pt will Ambulate: >150 feet;with modified independence PT Goal: Ambulate - Progress: Goal set today  Visit Information  Last PT Received On: 10/31/12 Assistance Needed: +1    Subjective Data  Subjective: i feel a little dizzy Patient Stated Goal: to go home   Prior Functioning  Home Living Lives With: Significant other Available Help at Discharge: Family;Available PRN/intermittently Type of Home: House Home Access: Level entry Home Layout: One level Bathroom Shower/Tub: Tub/shower unit;Curtain Firefighter: Standard Home Adaptive Equipment: None Prior Function Level of Independence: Independent Able to Take Stairs?: Yes Driving: Yes 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 Communication Communication:  (glasses (nearsighted)) Dominant Hand: Right    Cognition  Overall Cognitive Status: Appears within functional limits for tasks assessed/performed Arousal/Alertness: Awake/alert Orientation Level: Appears intact for tasks assessed;Oriented X4 / Intact Behavior During Session: [facility] for tasks performed    Extremity/Trunk Assessment Right Upper Extremity Assessment RUE  ROM/Strength/Tone: [facility] for tasks assessed Left Upper Extremity Assessment LUE ROM/Strength/Tone: [facility] for tasks assessed Right Lower Extremity Assessment RLE ROM/Strength/Tone: [facility] for tasks assessed Left Lower Extremity Assessment LLE ROM/Strength/Tone: [facility] for tasks assessed Trunk Assessment Trunk Assessment: Normal   Balance    End of Session PT - End of Session Equipment Utilized During Treatment: Gait belt Activity Tolerance: Patient limited by fatigue Patient left: in chair;with call bell/phone within reach;with family/visitor present Nurse Communication: Mobility status  GP     Fabio Asa 10/31/2012, 4:09 PM Charlotte Crumb, PT DPT  [phone]

## 2012-10-31 NOTE — Progress Notes (Signed)
FMTS Attending Daily Note:  Renold Don MD  [phone] pager  Family Practice pager:  [phone] I have seen and examined this patient and have reviewed their chart. I have discussed this patient with the resident. I agree with the resident's findings, assessment and care plan.  Please see my note from earlier today.
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
## 2012-11-01 MED ORDER — TOPIRAMATE 25 MG PO TABS: 25.0000 mg | ORAL_TABLET | Freq: Two times a day (BID) | ORAL | Status: DC

## 2012-11-01 MED ORDER — LORAZEPAM 2 MG PO TABS: 1.0000 mg | ORAL_TABLET | Freq: Four times a day (QID) | ORAL | Status: DC | PRN

## 2012-11-01 MED ORDER — PREDNISONE 10 MG PO TABS: ORAL_TABLET | ORAL | Status: AC

## 2012-11-01 MED ORDER — NAPROXEN 500 MG PO TABS: 500.0000 mg | ORAL_TABLET | Freq: Every day | ORAL | Status: DC | PRN

## 2012-11-01 MED ORDER — LORAZEPAM 1 MG PO TABS
2.0000 mg | ORAL_TABLET | Freq: Four times a day (QID) | ORAL | Status: DC | PRN
  Administered 2012-11-01: 2 mg via ORAL
  Filled 2012-11-01: qty 1
  Filled 2012-11-01: qty 2

## 2012-11-01 NOTE — Progress Notes (Signed)
Occupational Therapy Treatment Patient Details Name: Maria Griffith MRN: [id number] DOB: June 13, 1961 Today's Date: 11/01/2012 Time: 1308-6578 OT Time Calculation (min): 37 min  OT Assessment / Plan / Recommendation Comments on Treatment Session This 51 yo female limited by intermittent dizziness/spinning and double vision. Will ask PT to see for questionable vestibular issues.    Follow Up Recommendations  No OT follow up       Equipment Recommendations  Tub/shower seat (wiht back)       Frequency Min 3X/week   Plan Discharge plan needs to be updated    Precautions / Restrictions Precautions Precautions: Fall Restrictions Weight Bearing Restrictions: No   Pertinent Vitals/Pain 5/10 headache left temporal area (yesterday it was frontal)    ADL  ADL Comments: Pt c/o intermittent double vision, more so to the left upper and lower quadrants. Also with visual testing when stopping at midline after doing visual pursuits. Noted that when right eye is covered and then uncovered that the right has to make an additional minimal shift towards midline to have a conjugate gaze. This did not happen when right eye was covered. When patient states she sees double she reports it is vertical.       OT Goals Miscellaneous OT Goals OT Goal: Miscellaneous Goal #1 - Progress:  (Not progressing at this time due to trying to still pin point exactly what issue is)  Visit Information  Last OT Received On: 11/01/12 Assistance Needed: +1    Subjective Data  Subjective: I feel dizzy and am seeing double (one on top of the other).---at times when I was doing additional vision testing with her. 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    Mobility  Shoulder Instructions Bed Mobility Bed Mobility: Rolling Right;Rolling Left;Right Sidelying to Sit Rolling Right: 7: Independent Rolling Left: 7: Independent Right Sidelying to Sit: 7: Independent Supine to Sit: 6: Modified independent (Device/Increase time);HOB elevated Sitting - Scoot to Edge of Bed: 7: Independent Details for Bed Mobility Assistance: Just asked pt to take her time Transfers Sit to Stand: 4: Min guard;With upper extremity assist;With armrests;From bed;From chair/3-in-1;From toilet Stand to Sit: 4: Min guard;With upper extremity assist;With armrests;To bed;To chair/3-in-1;To toilet Details for Transfer Assistance: pt with initial posterior sway on 1st sit to stand--able to i'ly recover balance; vc for correct use of RW          Balance High Level Balance High Level Balance Comments: Loss of balance posteriorly when I had her look up--c/o double vision and dizziness (when later asked also voiced that she felt like she was spinning)   End of Session OT - End of Session Activity Tolerance:  (Limited by dizziness and double vision) Patient left: in bed;with call bell/phone within reach (fiance present)       Evette Georges [phone] 11/01/2012, 3:14 PM

## 2012-11-01 NOTE — Care Management Note (Signed)
    Page 1 of 1   11/01/2012     5:06:25 PM   CARE MANAGEMENT NOTE 11/01/2012  Patient:  Maria Griffith, Maria Griffith   Account Number:  [id number]  Date Initiated:  10/30/2012  Documentation initiated by:  Jacquelynn Cree  Subjective/Objective Assessment:   Admitted withheadache, lacunar infarct.     Action/Plan:   PT/OT evals-recommending outpatient PT, rolling walker   Anticipated DC Date:  11/01/2012   Anticipated DC Plan:  HOME/SELF CARE      DC Planning Services  CM consult      Choice offered to / List presented to:             Status of service:  Completed, signed off Medicare Important Message given?   (If response is "NO", the following Medicare IM given date fields will be blank) Date Medicare IM given:   Date Additional Medicare IM given:    Discharge Disposition:  HOME/SELF CARE  Per UR Regulation:  Reviewed for med. necessity/level of care/duration of stay  If discussed at Long Length of Stay Meetings, dates discussed:    Comments:  11/01/12 Spoke to patient about outpatient PT, she selected the [facility] location. Faxed referral, order, PT eval and d/c summary to [phone]. Rolling walker delivered to patient's room. Gave patient discount pharmacy card.Jacquelynn Cree RN, BSN, CCM

## 2012-11-01 NOTE — Progress Notes (Signed)
Pt dc instructions provided. Pt verbalized understanding. rx given to pt. Pt iv dc intact. Pt under no s/s distress.

## 2012-11-01 NOTE — Progress Notes (Signed)
Occupational Therapy Treatment Patient Details Name: Maria Griffith MRN: [id number] DOB: 09/27/1961 Today's Date: 11/01/2012 Time: 4098-1191 OT Time Calculation (min): 13 min  OT Assessment / Plan / Recommendation Comments on Treatment Session Feel that the testing done by PT earlier for vestibular and the addtional vision testing I did should have really increased her symptoms; however they did not so feel with time and more control of the headaches her vision issues should resolve. Educated her that if they did not when she went to follow up with her primary care physican to let him know and he could refer her for more vision testing  and/or OT on an outpatient basis.    Follow Up Recommendations  No OT follow up;Supervision/Assistance - 24 hour       Equipment Recommendations  Rolling walker with 5" wheels;Tub/shower seat (tub seat with back)       Frequency Min 3X/week   Plan Discharge plan remains appropriate    Precautions / Restrictions Precautions Precautions: Fall Restrictions Weight Bearing Restrictions: No   Pertinent Vitals/Pain 5/10 headache with visual concentration    ADL  Toilet Transfer: Simulated;Minimal assistance Toilet Transfer Method: Sit to stand Toilet Transfer Equipment:  (Bed, out into hall, back to recliner) Toileting - Architect and Hygiene: Simulated;Min guard Where Assessed - Engineer, mining and Hygiene: Standing Equipment Used: Gait belt (HHA) ADL Comments: Pt not showing the additional shifts with one eye occluded and then un-occluded this time. Also pt showing ability to maintain eyes when tested separately maintained in the directions of testing. Does still report at times double vision that is vertical.      OT Goals ADL Goals ADL Goal: Toilet Transfer - Progress: Not progressing (Still needs Min A to min guard A) Miscellaneous OT Goals OT Goal: Miscellaneous Goal #1 - Progress: Discontinued (comment) (Not  needed at this time, feel vision should resolve on own)  Visit Information  Last OT Received On: 11/01/12 Assistance Needed: +1    Subjective Data  Subjective: I feel dizzy and am seeing double (one on top of the other).---at times when I was doing additional vision testing with her. 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 Explained to her that this is not safe for her or anyone else.    Mobility   Bed Mobility Bed Mobility: Rolling Right;Rolling Left;Right Sidelying to Sit Rolling Right: 7: Independent Rolling Left: 7: Independent Right Sidelying to Sit: 7: Independent Supine to Sit: 6: Modified independent (Device/Increase time);HOB elevated Sitting - Scoot to Edge of Bed: 7: Independent Details for Bed Mobility Assistance: Just asked pt to take her time Transfers Sit to Stand: 4: Min guard;With upper extremity assist;With armrests;From bed;From chair/3-in-1;From toilet Stand to Sit: 4: Min guard;With upper extremity assist;With armrests;To bed;To chair/3-in-1;To toilet Details for Transfer Assistance: pt with initial posterior sway on 1st sit to stand--able to i'ly recover balance; vc for correct use of RW       Exercises  Other Exercises Other Exercises: Due to pt's report of spinning sensation that lasted only seconds when  rolling Lt side to supine to Rt side with OT, performed Hallpike Dix and supine head roll testing bil to assess for possible BPPV. No nystagmus noted; spinning (a rolling to the Rt) sensation  triggered twice lasting ~30 seconds--both times when moving supine to long-sitting to prepare for [employer]. Pt did experience diplopia with each change in position with vestibular testing and was observed to have dysconjugate gaze that she could correct in 5-15 seconds. OT present and performed additional vision assessments.   Balance High Level Balance High Level Balance Comments: Loss of balance posteriorly when I had her look up--c/o double vision and dizziness (when later asked also voiced that she felt like she was spinning)   End of Session OT - End of Session Activity Tolerance:  (Limited by dizziness and double vision) Patient left: in bed;with call bell/phone within reach (fiance present)       Evette Georges [phone] 11/01/2012, 3:45 PM

## 2012-11-01 NOTE — Progress Notes (Signed)
Pt states she's having the same feeling as lastnight, numbness, tingling of lips. Pt feels related to switch of meds.  Pt under no s/s distress. Will continue to monitor.

## 2012-11-01 NOTE — Progress Notes (Signed)
Physical Therapy Treatment Patient Details Name: TNIA ANGLADA MRN: [id number] DOB: 01/11/1961 Today's Date: 11/01/2012 Time: 4098-1191 PT Time Calculation (min): 45 min  PT Assessment / Plan / Recommendation Comments on Treatment Session  51 yo experiencing transient dizziness/vertigo and negative for BPPV tests. Is experiencing episodes of diplopia and gait deviations with imbalance. Will benefit from use of RW for use on D/C. ? source of vertigo related to migraines vs anxiety.    Follow Up Recommendations  Outpatient PT;Supervision for mobility/OOB     Does the patient have the potential to tolerate intense rehabilitation     Barriers to Discharge        Equipment Recommendations  Tub/shower seat (wiht back)    Recommendations for Other Services    Frequency Min 3X/week   Plan Frequency remains appropriate;Discharge plan needs to be updated    Precautions / Restrictions Precautions Precautions: Fall Restrictions Weight Bearing Restrictions: No   Pertinent Vitals/Pain HR64 to 114 with walking 35 ft; dyspnea incr, yet not labored Headache incr with activity, number not rated    Mobility  Bed Mobility Bed Mobility: Rolling Right;Rolling Left;Right Sidelying to Sit Rolling Right: 7: Independent Rolling Left: 7: Independent Right Sidelying to Sit: 7: Independent Supine to Sit: 6: Modified independent (Device/Increase time);HOB elevated Sitting - Scoot to Edge of Bed: 7: Independent Details for Bed Mobility Assistance: Just asked pt to take her time Transfers Transfers: Sit to Stand;Stand to Sit Sit to Stand: 4: Min guard;With upper extremity assist;With armrests;From bed;From chair/3-in-1;From toilet Stand to Sit: 4: Min guard;With upper extremity assist;With armrests;To bed;To chair/3-in-1;To toilet Details for Transfer Assistance: pt with initial posterior sway on 1st sit to stand--able to i'ly recover balance; vc for correct use of  RW Ambulation/Gait Ambulation/Gait Assistance: 4: Min assist Ambulation Distance (Feet): 130 Feet (70, rest, 30, rest, 30) Assistive device: Rolling walker;None Ambulation/Gait Assistance Details: initially walked with no device and pt with difficulty advancing LEs and reported they felt very heavy, this improved somewhat when asked to incr her velocity; incr lateral sway bil with wide base of support; pt's head "wobbled" Rt and Lt; pt with incr dyspnea and HR incr 64 to 114; when ambulating with RW, gait significantly smoother, without lateral sway, no head "wobble", and advancing LEs much easier Gait Pattern:  (see gait assistance for details) Gait velocity: decreased General Gait Details: denied spinning; + diplopia at times Stairs: No    Exercises Other Exercises Other Exercises: Due to pt's report of spinning sensation that lasted only seconds when rolling Lt side to supine to Rt side with OT, performed Hallpike Dix and supine head roll testing bil to assess for possible BPPV. No nystagmus noted; spinning (a rolling to the Rt) sensation triggered twice lasting ~30 seconds--both times when moving supine to long-sitting to prepare for [employer]. Pt did experience diplopia with each change in position with vestibular testing and was observed to have dysconjugate gaze that she could correct in 5-15 seconds. OT present and performed additional vision assessments.   PT Diagnosis:    PT Problem List:   PT Treatment Interventions:     PT Goals Acute Rehab PT Goals Time For Goal Achievement: 11/05/12 Pt will go Sit to Stand: with modified independence PT Goal: Sit to Stand - Progress: Progressing toward goal Pt will go Stand to Sit: with modified independence PT Goal: Stand to Sit - Progress: Progressing toward goal Pt will Ambulate: >150 feet;with modified independence PT Goal: Ambulate - Progress: Progressing toward goal  Visit Information  Last PT Received On: 11/01/12 Assistance  Needed: +1    Subjective Data  Subjective: Reported during OT session that she felt dizzy, like she was spinning. 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

## 2012-11-01 NOTE — Progress Notes (Signed)
Family Medicine Teaching Service Progress Note [phone]  Subjective: Overnight events: none  Patient still complains of similar headache. Patient reports some improvement after taking Imitrex at 0900, pain 8/10 (from 9/10), throbbing L-sided (no longer frontal), improves with lights off and eyes closed. Associated with nausea, lightheadedness, noted mild improvement from yesterday. She received a dose of Imitrex last night, and reported some numbness and tingling on the L-side of her face, which resolved soon after. Patient was anxious and slightly tearful regarding her persisting headaches.  States she feels comfortable to drive as soon as possible, and feels like she is able to return home.  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   Objective: Vital signs in last 24 hours: Temp:  [98.1 F (36.7 C)-98.3 F (36.8 C)] 98.3 F (36.8 C) (11/15 0514) Pulse Rate:  [70-97] 72  (11/15 0514) Resp:  [14-20] 14  (11/15 0514) BP: (124-141)/(76-91) 124/78 mmHg (11/15 0514) SpO2:  [97 %-100 %] 100 % (11/15 0514) Weight change:  Last BM Date: 10/28/12  Intake/Output from previous day: 11/14 0701 - 11/15 0700 In: -  Out: 500 [Urine:500] Intake/Output this shift:   GEN: NAD, lying flat. Lights off in room. Anxious, almost tearful episode  HEENT: PERRLA, EOMI, L-temporal region non-tender to palpation CV: RRR, no murmurs. PULM: CTAB, no rales, rhonchi, or wheezes EXT: no edema or tenderness. NEURO: CN II-XII intact, 5/5 strength bilateral LE and UE. Sensation intact b/l. Speech normal. A/o x 3.  Lab Results:  [facility] 10/29/12 1603  WBC 4.0  HGB 13.1  HCT 37.5  PLT 257   BMET  Basename 10/29/12 1603  NA 135  K 3.6  CL 98  CO2 28  GLUCOSE 86  BUN 17  CREATININE 0.91  CALCIUM 9.6     Studies/Results: Mr Shirlee Latch Wo Contrast  10/30/2012  *RADIOLOGY REPORT*  Clinical Data:  Weakness and dizziness with nausea.  Chronic headaches and syncope.  Hypertension.  Possible stroke.  LIMITED MRI BRAIN WITHOUT CONTRAST MRA HEAD WITHOUT CONTRAST MRA NECK WITHOUT AND WITH CONTRAST  Technique:  Limited MR of the brain with thin section diffusion imaging was performed to evaluate for acute infarct.  Angiographic images of the Circle of Willis were obtained using MRA technique without intravenous contrast.  Angiographic images of the neck were obtained using MRA technique without and with intravenous contrast.  Contrast:17 ml MultiHance.  Comparison:10/29/2012 MR brain.  10/18/2012 CT.  MRI HEAD  Findings: On thin section diffusion imaging centered at the posterior fossa level, no definitive acute infarct is noted.  Cervical spondylotic changes noted on 10/29/2012 sagittal T1 sequence are not assessed on the present exam.  IMPRESSION: On thin section diffusion imaging centered at the posterior fossa level, no definitive acute infarct is noted.  MRA HEAD  Findings:Left periophthalmic 4.6 mm aneurysm suspected.  Prominent infundibulum at the origin of the right posterior cerebral artery and right anterior choroidal artery.  Stability can be confirmed on follow-up or evaluated at the time of angiography if performed for the left periophthalmic artery aneurysm.  Mild to slightly moderate narrowing proximal A2 segments right anterior cerebral artery.  Middle cerebral artery mild to moderate branch vessel irregularity more notable on the right.  Mild irregularity of the vertebral arteries and basilar artery without high-grade stenosis.  Nonvisualization AICAs.  Slightly  bulbous appearance of the basilar tip without discrete saccular aneurysm.  Fetal type origin of the right posterior cerebral artery.  Mild irregularity and narrowing of the posterior cerebral arteries bilaterally more notable on the right and  most prominent involving distal branches.  IMPRESSION: Left periophthalmic 4.6 mm aneurysm suspected.  Prominent infundibulum origin of the right posterior cerebral artery and right anterior carotid artery as noted above.  Intracranial atherosclerotic type changes predominant involving branch vessels as detailed above.  MRA NECK  Findings:Normal configuration of the origin of the great vessels from the aortic arch.  Slight loss of signal proximal right common carotid artery may be related to artifact.  Right carotid bifurcation without significant narrowing.  Mild ectasia with fold and slight narrowing right internal carotid artery 3 cm above its origin.  Left common carotid artery unremarkable.  Slight narrowing proximal left internal carotid artery without hemodynamically significant stenosis.  No significant narrowing of the subclavian arteries proximal to the takeoff of the vertebral arteries.  Left vertebral artery is dominant in size.  Ectatic vertebral arteries which appears slightly irregular but without definitive findings of dissection.  IMPRESSION: No evidence of hemodynamically significant stenosis involving either carotid bifurcation or vertebral arteries.  No definitive findings of dissection.  Cervical spondylotic changes with various degrees of spinal stenosis incompletely assessed on the present exam.  This has been made a PRA call report utilizing dashboard call feature.   Original Report Authenticated By: Lacy Duverney, M.D.    Mr Angiogram Neck W Wo Contrast  10/30/2012  *RADIOLOGY REPORT*  Clinical Data:  Weakness and dizziness with nausea.  Chronic headaches and syncope.  Hypertension.  Possible stroke.  LIMITED MRI BRAIN WITHOUT CONTRAST MRA HEAD WITHOUT CONTRAST MRA NECK WITHOUT AND WITH CONTRAST  Technique:  Limited MR of the brain with thin section diffusion imaging was performed to evaluate for acute infarct.  Angiographic images of the Circle of Willis were obtained using MRA technique  without intravenous contrast.  Angiographic images of the neck were obtained using MRA technique without and with intravenous contrast.  Contrast:17 ml MultiHance.  Comparison:10/29/2012 MR brain.  10/18/2012 CT.  MRI HEAD  Findings: On thin section diffusion imaging centered at the posterior fossa level, no definitive acute infarct is noted.  Cervical spondylotic changes noted on 10/29/2012 sagittal T1 sequence are not assessed on the present exam.  IMPRESSION: On thin section diffusion imaging centered at the posterior fossa level, no definitive acute infarct is noted.  MRA HEAD  Findings:Left periophthalmic 4.6 mm aneurysm suspected.  Prominent infundibulum at the origin of the right posterior cerebral artery and right anterior choroidal artery.  Stability can be confirmed on follow-up or evaluated at the time of angiography if performed for the left periophthalmic artery aneurysm.  Mild to slightly moderate narrowing proximal A2 segments right anterior cerebral artery.  Middle cerebral artery mild to moderate branch vessel irregularity more notable on the right.  Mild irregularity of the vertebral arteries and basilar artery without high-grade stenosis.  Nonvisualization AICAs.  Slightly bulbous appearance of the basilar tip without discrete saccular aneurysm.  Fetal type origin of the right posterior cerebral artery.  Mild irregularity and narrowing of the posterior cerebral arteries bilaterally more notable on the right and most prominent involving distal branches.  IMPRESSION: Left periophthalmic 4.6 mm aneurysm suspected.  Prominent infundibulum origin of the right posterior cerebral artery and right anterior carotid artery as noted above.  Intracranial atherosclerotic type changes predominant involving branch vessels as detailed above.  MRA  NECK  Findings:Normal configuration of the origin of the great vessels from the aortic arch.  Slight loss of signal proximal right common carotid artery may be related  to artifact.  Right carotid bifurcation without significant narrowing.  Mild ectasia with fold and slight narrowing right internal carotid artery 3 cm above its origin.  Left common carotid artery unremarkable.  Slight narrowing proximal left internal carotid artery without hemodynamically significant stenosis.  No significant narrowing of the subclavian arteries proximal to the takeoff of the vertebral arteries.  Left vertebral artery is dominant in size.  Ectatic vertebral arteries which appears slightly irregular but without definitive findings of dissection.  IMPRESSION: No evidence of hemodynamically significant stenosis involving either carotid bifurcation or vertebral arteries.  No definitive findings of dissection.  Cervical spondylotic changes with various degrees of spinal stenosis incompletely assessed on the present exam.  This has been made a PRA call report utilizing dashboard call feature.   Original Report Authenticated By: Lacy Duverney, M.D.    Mr Brain Ltd W/o Cm  10/30/2012  *RADIOLOGY REPORT*  Clinical Data:  Weakness and dizziness with nausea.  Chronic headaches and syncope.  Hypertension.  Possible stroke.  LIMITED MRI BRAIN WITHOUT CONTRAST MRA HEAD WITHOUT CONTRAST MRA NECK WITHOUT AND WITH CONTRAST  Technique:  Limited MR of the brain with thin section diffusion imaging was performed to evaluate for acute infarct.  Angiographic images of the Circle of Willis were obtained using MRA technique without intravenous contrast.  Angiographic images of the neck were obtained using MRA technique without and with intravenous contrast.  Contrast:17 ml MultiHance.  Comparison:10/29/2012 MR brain.  10/18/2012 CT.  MRI HEAD  Findings: On thin section diffusion imaging centered at the posterior fossa level, no definitive acute infarct is noted.  Cervical spondylotic changes noted on 10/29/2012 sagittal T1 sequence are not assessed on the present exam.  IMPRESSION: On thin section diffusion imaging  centered at the posterior fossa level, no definitive acute infarct is noted.  MRA HEAD  Findings:Left periophthalmic 4.6 mm aneurysm suspected.  Prominent infundibulum at the origin of the right posterior cerebral artery and right anterior choroidal artery.  Stability can be confirmed on follow-up or evaluated at the time of angiography if performed for the left periophthalmic artery aneurysm.  Mild to slightly moderate narrowing proximal A2 segments right anterior cerebral artery.  Middle cerebral artery mild to moderate branch vessel irregularity more notable on the right.  Mild irregularity of the vertebral arteries and basilar artery without high-grade stenosis.  Nonvisualization AICAs.  Slightly bulbous appearance of the basilar tip without discrete saccular aneurysm.  Fetal type origin of the right posterior cerebral artery.  Mild irregularity and narrowing of the posterior cerebral arteries bilaterally more notable on the right and most prominent involving distal branches.  IMPRESSION: Left periophthalmic 4.6 mm aneurysm suspected.  Prominent infundibulum origin of the right posterior cerebral artery and right anterior carotid artery as noted above.  Intracranial atherosclerotic type changes predominant involving branch vessels as detailed above.  MRA NECK  Findings:Normal configuration of the origin of the great vessels from the aortic arch.  Slight loss of signal proximal right common carotid artery may be related to artifact.  Right carotid bifurcation without significant narrowing.  Mild ectasia with fold and slight narrowing right internal carotid artery 3 cm above its origin.  Left common carotid artery unremarkable.  Slight narrowing proximal left internal carotid artery without hemodynamically significant stenosis.  No significant narrowing of the subclavian arteries proximal to the  takeoff of the vertebral arteries.  Left vertebral artery is dominant in size.  Ectatic vertebral arteries which appears  slightly irregular but without definitive findings of dissection.  IMPRESSION: No evidence of hemodynamically significant stenosis involving either carotid bifurcation or vertebral arteries.  No definitive findings of dissection.  Cervical spondylotic changes with various degrees of spinal stenosis incompletely assessed on the present exam.  This has been made a PRA call report utilizing dashboard call feature.   Original Report Authenticated By: Lacy Duverney, M.D.     Medications:  Scheduled:    . amLODipine  5 mg Oral Daily  . aspirin EC  325 mg Oral Daily  . citalopram  20 mg Oral Daily  . [COMPLETED] cyclobenzaprine  5 mg Oral Once  . enoxaparin  40 mg Subcutaneous Q24H  . lisinopril  20 mg Oral Daily   And  . hydrochlorothiazide  12.5 mg Oral Daily  . [COMPLETED] ketorolac  30 mg Intravenous Once  . [EXPIRED] LORazepam      . [COMPLETED] LORazepam      . [EXPIRED] predniSONE  10 mg Oral AC breakfast  . [COMPLETED] predniSONE  10 mg Oral Nightly  . predniSONE  10 mg Oral Nightly  . [COMPLETED] predniSONE  5 mg Oral PC lunch  . [COMPLETED] predniSONE  5 mg Oral PC supper  . predniSONE  5 mg Oral 3 x daily with food  . predniSONE  5 mg Oral 4X daily taper  . topiramate  25 mg Oral BID  . [DISCONTINUED] predniSONE  10 mg Oral PC lunch   JXB:[id number], acetaminophen, albuterol, cyclobenzaprine, ondansetron (ZOFRAN) IV, senna-docusate, [COMPLETED] SUMAtriptan  Assessment/Plan:  Maria Griffith is a 51 y/o female with history of HTN, chronic migraine HA, and anxiety presenting with pre-syncopal episode of lightheadedness, dizziness, and generalized weakness. Repeat MRI showed no acute infarcts.  1. Chronic, Complicated Migraine Headaches (POA)  - Mild improvement. Stable. Anxiety likely contributing. - Hx recent work-up in ED (10/18/12) w/ negative: Head CT, Cardiac CTA, ECHO).  - initial MRI in ED concerning for new tiny lacunar infarcts. Repeat MRI showed no acute infarct. No  focal neuro deficits on exam.  - Risk stratification: Lipid panel (normal), HgbA1C (5.5)  - ASA 325 daily. Neuro does not recommend continuing daily ASA on discharge  - PT/OT, Speech/Lang path evaluated. Rec to have supervision at home. - Tylenol 650mg  PRN. Received Toradol x1 IM. Flexeril 5mg  PO x1. - Imitrex (migraine abortive therapy), Topamax (migraine prophylactic). Consider alternative prophylactic therapies as outpt (propanolol, TCA) - Prednisone DosePak per Neuro for short-term HA relief - Neurology recs: due to refractory complicated migraines. Initiate both abortive and prophylactic migraine treatments. Pending work recommendations regarding "clearance for driving"  2. HTN. Variable control recently, w/ hx of uncontrolled BP. (155/111-126/85 in clinic notes)  - Improved, Stable. Last BP 129/76  - Cont home norvasc, ACEi/HCTZ in am.  -her metoprolol was discontinued in clinic with concern for orthostatic symptoms, may restart if necessary.   3. Anxiety.  - Stable. Likely contributing to migraine headaches.  -continue celexa. (started on 11/7, needs more time to determine if effective, f/u outpt)  - on Ativan while in hospital, home Xanax on discharge  4. FEN/GI: Regular diet  5. Prophylaxis: SQ lovenox.   6. Disposition: Currently stable, negative work-up for stroke. Likely symptoms due to complicated migraine HA. Continue current treatment regimen per Neuro recs, likely discharge to home today. Waiting on clearance to drive, and return to work. Outpt f/u scheduled.  LOS: 3 days   Sofie Hartigan [phone] 11/01/2012, 9:39 AM  I have seen patient and agree with above findings.    PE: GEN: NAD, lying flat. Lights off in room.  HEENT: normal speech, PERRLA, EOMI. No Tenderness in temporal area.  CV: RRR, no murmurs.  PULM: CTAB  EXT: no edema or tenderness.  NEURO: CN II-XII intact, 5/5 strength bilateral LE and UE. Normal finger-nose testing. Sensation intact. No  clonus. Speech normal. A/o x 3  Assessment/Plan:  Maria Griffith is a 51 y.o. year old female with history of HTN and anxiety presenting with lightheadedness, worsened headaches x 2 weeks.   1. Neurologic/atypical migraine. Repeat MRI did not show acute infarct. History chronic headache, that is unilateral, throbbing, associated with nausea and photophobia. Most likely atypical migraines with some component of anxiety.   -mild relief with imitrex, toradol and prednisone. -started topamax for ppx. -will arrange outpatient neurology. -continue daily aspirin  -FLP, A1c within range  -OT/PT eval cleared with home shower chair.    2. HTN. Variable control recently. (155/111-126/85 in clinic notes)  -Cont home norvasc, ACEi/HCTZ in am.  -her metoprolol was discontinued in clinic with concern for orthostatic symptoms, may restart if necessary.   3. Anxiety.  -continue celexa.  -will give short course ativan for anxiety with new start celexa at DC.   4. FEN/GI: Reg diet. NS at [facility]. No hypoglycemia.  6. Prophylaxis: SQ lovenox.  7. Disposition: Full code. Patient's fiance updated at bedside. Possibly to home this pm with outpatient neurology f/u.   LOS3 Lloyd Huger, MD [facility] Resident - PGY-3 11/01/2012 12:12 PM

## 2012-11-01 NOTE — Progress Notes (Signed)
FMTS Attending Daily Note:  Renold Don MD  [phone] pager  Family Practice pager:  [phone] I have seen and examined this patient and have reviewed their chart. I have discussed this patient with the resident. I agree with the resident's findings, assessment and care plan.  Long discussion with family about entire hospital course thus far.  Headaches are main concern. Plan is to continue Topamax for preventative therapy, hold on Imitrex due to patient's concerns for facial numbness after administration and switch to Ibuprofen/Tramadol for abortive therapy.  Finish steroid dose pack.   - DC home today with follow up with PCP and Neurology.  Also to FU with cardiology for previously scheduled stress test.

## 2012-11-02 NOTE — Discharge Summary (Signed)
Family Medicine Teaching Service  Discharge Note : Attending Jeff Milano Rosevear MD Pager [phone] Inpatient Team Pager:  [phone]  I have seen and examined this patient, reviewed their chart and discussed discharge planning with the resident at the time of discharge. I agree with the discharge plan as above.  

## 2012-11-04 ENCOUNTER — Ambulatory Visit (INDEPENDENT_AMBULATORY_CARE_PROVIDER_SITE_OTHER): Payer: PRIVATE HEALTH INSURANCE | Admitting: Family Medicine

## 2012-11-04 VITALS — BP 113/79 | HR 101 | Ht 64.0 in | Wt 169.0 lb

## 2012-11-04 DIAGNOSIS — R51 Headache: Secondary | ICD-10-CM

## 2012-11-04 DIAGNOSIS — F419 Anxiety disorder, unspecified: Secondary | ICD-10-CM

## 2012-11-04 DIAGNOSIS — R0602 Shortness of breath: Secondary | ICD-10-CM

## 2012-11-04 DIAGNOSIS — J4540 Moderate persistent asthma, uncomplicated: Secondary | ICD-10-CM | POA: Insufficient documentation

## 2012-11-04 DIAGNOSIS — F411 Generalized anxiety disorder: Secondary | ICD-10-CM

## 2012-11-04 NOTE — Assessment & Plan Note (Signed)
A: denies head pain. Still feeling dizzy and lightheaded.  Meds: compliant with topamax. P: continue topamax. Limit naproxen.

## 2012-11-04 NOTE — Patient Instructions (Addendum)
Maria Griffith,  Thank you for coming in to see me today in follow up.  Please continue your medications.  I recommend restarting celexa for anxiety.   Keep cardiology follow up.   Here are the results of your ECHO.   Study Conclusions  - Left ventricle: The cavity size was normal. Wall thickness was increased in a pattern of mild LVH. Systolic function was normal. The estimated ejection fraction was in the range of 60% to 65%. - Aortic valve: Mild regurgitation. - Atrial septum: No defect or patent foramen ovale was identified.   Schedule an appt to follow up with Dr. Benjamin Stain or me in 2-3 weeks after your cardiology visit.   Dr. Armen Pickup

## 2012-11-04 NOTE — Progress Notes (Signed)
Subjective:     Patient ID: Maria Griffith, female   DOB: 1961/05/20, 51 y.o.   MRN: [id number]  HPI 51 yo F presents for hospital follow-up. She was in the hospital from 11/12-11/15/13 for evaluation of syncope. Evaluation was negative for stroke but positive for complicated migraine.   She complains of shortness of breath since her hospitalization. Shortness of breath occurs with at rest and with exertion and is associated with feeling dizzy and lightheaded. She denies chest pain, cough and wheezing. She has tried her albuterol inhaler when she felt short of breath but this did not help.   Review of Systems As per HPI     Objective:   Physical Exam BP 113/79  Pulse 101  Ht 5\' 4"  (1.626 m)  Wt 169 lb (76.658 kg)  BMI 29.01 kg/m2  SpO2 98%  LMP 10/10/2012 Ambulatory pulse ox: 98% General appearance: alert, cooperative and no distress, walking with walker. Intermittently fatigued during exam. Neck: no adenopathy, no carotid bruit, no JVD, supple, symmetrical, trachea midline and thyroid not enlarged, symmetric, no tenderness/mass/nodules Lungs: clear to auscultation bilaterally Heart: regular rate and rhythm, S1, S2 normal, no murmur, click, rub or gallop Extremities: extremities normal, atraumatic, no cyanosis or edema     Assessment and Plan:

## 2012-11-04 NOTE — Assessment & Plan Note (Signed)
A: improved. Still likely contributing to symptoms.  P:  Continue celexa and ativan prn only .

## 2012-11-04 NOTE — Assessment & Plan Note (Signed)
A: atypical shortness of breath at rest and exertion with normal pulse ox. No evidence of DVT or PE. Normal EF or recent ECHO. No pneumonia on recent CXR.  P:  Continue with cardiology follow up for stress test, patient may need nuclear medicine stress test if she is not able to walk on the treadmill.

## 2012-11-06 ENCOUNTER — Ambulatory Visit: Payer: Self-pay | Attending: Family Medicine

## 2012-11-06 DIAGNOSIS — R5381 Other malaise: Secondary | ICD-10-CM | POA: Insufficient documentation

## 2012-11-06 DIAGNOSIS — R262 Difficulty in walking, not elsewhere classified: Secondary | ICD-10-CM | POA: Insufficient documentation

## 2012-11-06 DIAGNOSIS — IMO0001 Reserved for inherently not codable concepts without codable children: Secondary | ICD-10-CM | POA: Insufficient documentation

## 2012-11-06 DIAGNOSIS — M6281 Muscle weakness (generalized): Secondary | ICD-10-CM | POA: Insufficient documentation

## 2012-11-08 ENCOUNTER — Ambulatory Visit: Payer: Self-pay

## 2012-11-12 ENCOUNTER — Ambulatory Visit (INDEPENDENT_AMBULATORY_CARE_PROVIDER_SITE_OTHER): Payer: PRIVATE HEALTH INSURANCE | Admitting: Cardiovascular Disease

## 2012-11-12 ENCOUNTER — Encounter: Payer: Self-pay | Admitting: Physician Assistant

## 2012-11-12 ENCOUNTER — Encounter: Payer: Self-pay | Admitting: Cardiovascular Disease

## 2012-11-12 VITALS — BP 136/90 | HR 84 | Resp 16 | Ht 64.0 in | Wt 167.0 lb

## 2012-11-12 DIAGNOSIS — I351 Nonrheumatic aortic (valve) insufficiency: Secondary | ICD-10-CM

## 2012-11-12 DIAGNOSIS — R42 Dizziness and giddiness: Secondary | ICD-10-CM

## 2012-11-12 DIAGNOSIS — I359 Nonrheumatic aortic valve disorder, unspecified: Secondary | ICD-10-CM

## 2012-11-12 NOTE — Patient Instructions (Addendum)
Your physician wants you to follow-up in:  12 months.  You will receive a reminder letter in the mail two months in advance. If you don't receive a letter, please call our office to schedule the follow-up appointment.   
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
## 2012-11-13 ENCOUNTER — Ambulatory Visit: Payer: Self-pay | Admitting: Physical Therapy

## 2012-11-15 ENCOUNTER — Encounter: Payer: Self-pay | Admitting: Family Medicine

## 2012-11-15 DIAGNOSIS — I351 Nonrheumatic aortic (valve) insufficiency: Secondary | ICD-10-CM | POA: Insufficient documentation

## 2012-11-19 ENCOUNTER — Encounter: Payer: Self-pay | Admitting: Family Medicine

## 2012-11-19 ENCOUNTER — Ambulatory Visit (INDEPENDENT_AMBULATORY_CARE_PROVIDER_SITE_OTHER): Payer: PRIVATE HEALTH INSURANCE | Admitting: Family Medicine

## 2012-11-19 VITALS — BP 132/90 | HR 102 | Temp 99.3°F | Ht 64.0 in | Wt 165.3 lb

## 2012-11-19 DIAGNOSIS — R51 Headache: Secondary | ICD-10-CM

## 2012-11-19 DIAGNOSIS — I10 Essential (primary) hypertension: Secondary | ICD-10-CM

## 2012-11-19 DIAGNOSIS — R55 Syncope and collapse: Secondary | ICD-10-CM

## 2012-11-19 DIAGNOSIS — F411 Generalized anxiety disorder: Secondary | ICD-10-CM

## 2012-11-19 DIAGNOSIS — F419 Anxiety disorder, unspecified: Secondary | ICD-10-CM

## 2012-11-19 MED ORDER — CITALOPRAM HYDROBROMIDE 20 MG PO TABS: 20.0000 mg | ORAL_TABLET | Freq: Every day | ORAL | Status: DC

## 2012-11-19 MED ORDER — ALBUTEROL SULFATE HFA 108 (90 BASE) MCG/ACT IN AERS: 2.0000 | INHALATION_SPRAY | Freq: Four times a day (QID) | RESPIRATORY_TRACT | Status: DC | PRN

## 2012-11-19 MED ORDER — ONDANSETRON HCL 4 MG PO TABS: 4.0000 mg | ORAL_TABLET | Freq: Three times a day (TID) | ORAL | Status: DC | PRN

## 2012-11-19 MED ORDER — TOPIRAMATE 25 MG PO TABS: 50.0000 mg | ORAL_TABLET | Freq: Two times a day (BID) | ORAL | Status: DC

## 2012-11-19 MED ORDER — AMLODIPINE BESYLATE 5 MG PO TABS: 5.0000 mg | ORAL_TABLET | Freq: Every day | ORAL | Status: DC

## 2012-11-19 MED ORDER — QUINAPRIL-HYDROCHLOROTHIAZIDE 20-12.5 MG PO TABS: 1.0000 | ORAL_TABLET | Freq: Every day | ORAL | Status: DC
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
## 2012-11-19 NOTE — Patient Instructions (Addendum)
It was good to see you today, though I'm sorry you're not feeling well.  - For your headaches, dizziness, and balance issues, they are not improving and in fact may be worsening so I am ordering a neurology referral.  This will likely be with [facility] and they may be able to set up a payment plan with you.  Our office will call you about this.  I am also recommending following up with a psychiatrist - call [facility]: [phone] and set up an appointment.   I am increasing your topiramate to see if this helps - take twice daily.   - For your other medical issues, I am refilling your medications.  I do not recommend ativan or flexeril because you have such bad balance and dizziness issues, and these medications can make this worse.  - Take other medications as prescribed.  I do not recommend driving at this time.  - For nausea, I am prescribing zofran.  Stop taking phenergan.  - Follow up with me in about 1 month or after you have seen psychiatry and neurology.

## 2012-11-20 ENCOUNTER — Ambulatory Visit: Payer: PRIVATE HEALTH INSURANCE

## 2012-11-21 ENCOUNTER — Encounter: Payer: Self-pay | Admitting: Family Medicine

## 2012-11-21 ENCOUNTER — Ambulatory Visit: Payer: Self-pay | Admitting: Cardiovascular Disease

## 2012-11-21 NOTE — Assessment & Plan Note (Addendum)
Recent syncope, current dizziness and imbalance likely secondary to psychiatric illness but cannot rule out subtle intracranial pathology that could have been missed on imaging. - Neurology referral given worsened dizziness, imbalance, headache - Difficult in this patient because [employer] does not cover neurology visits.  Attempting with [facility], though cost may be an issue.  Patient also stated she would try [facility], who saw her in hospital. - Also recommended [facility] psychiatry and gave phone number for patient to make appt [phone])  - Possibly related to complex migraine - increased topiramate from 25mg  BID to 50mg  BID - Zofran prn nausea, stop phenergan; stop ativan and flexeril given dizziness/imbalance - Recommend no driving - F/u 1 month or after see psychiatry/neurology; aware of reasons to seek emergent care - Continue outpatient PT

## 2012-11-21 NOTE — Assessment & Plan Note (Addendum)
Likely complicated migraine causing some imbalance, dizziness, nausea, per neuro at hospital consult - Increased topiramate to 50mg  BID; continue citalopram daily - No driving at this time - Zofran prn nausea; stop phenergan as this is not working and can increase drowsiness - Continued control of HTN will likely help symptoms - F/u in 1 month/after see psych and/or neuro - If no improvement, consider changing controller to propranolol / restarting imitrex

## 2012-11-21 NOTE — Assessment & Plan Note (Addendum)
-   Reinterated importance of compliance with medications and refilled norvasc - Continue norvasc 5mg  daily and accuretic 20-12.5mg  daily

## 2012-11-21 NOTE — Assessment & Plan Note (Signed)
-   Continue citalopram daily - D/c ativan and flexeril because of dizziness/imbalance

## 2012-11-25 ENCOUNTER — Telehealth: Payer: Self-pay | Admitting: *Deleted

## 2012-11-25 NOTE — Telephone Encounter (Signed)
Patient called back to let Dr. Benjamin Stain know that the Ondansetron prescription she gave her is breaking her out.  Wants something else called in for nausea.  Also wanted Dr. Benjamin Stain to know that she was seen at [facility] today.  They increased her Celexa to 40 mg daily and started her on Abilify 2 mg daily.  Maria Griffith

## 2012-11-25 NOTE — Telephone Encounter (Signed)
Called to advise Maria Griffith that I got her scheduled to see a Neurologist at [facility] on 01/25/2012 @ 12:45.  Ms. Mirkin wanted me to send a message to Dr. Benjamin Stain to let her know that her headaches have gotten worse.  Also asking if Dr. Benjamin Stain is going to keep her out of work until she is seen by the Neurologist in February.  Will forward to Dr. Benjamin Stain for review.  [name]

## 2012-11-27 ENCOUNTER — Ambulatory Visit: Payer: Self-pay | Admitting: Family Medicine

## 2012-11-29 ENCOUNTER — Telehealth: Payer: Self-pay | Admitting: Family Medicine

## 2012-11-29 NOTE — Telephone Encounter (Signed)
Called and spoke to patient regarding worsening headaches that patient had reported in a previous call to RN.  Told patient that I called [facility] and was unable to get sooner appointment than early Feb, but they said they would call her if any cancellations.  Told patient to make appt with me or my team every 2-3 weeks to follow up until her appointment, or sooner if any worsening headache, dizziness, or nausea.  Also told her we would try to get her records from [facility] psychiatry, and she stated she was started on abilify 2g and citalopram decreased from 60mg  to 40mg .  Pt still complaining of nausea and that zofran makes her break out, and phenergan does not work well.  Told patient would need to see before could decide on other therapy, and that options limited if those 2 medications not working for her.  Still complaining of severe headache and told patient we could follow up at sooner visit if patient needed, and she knows to go to ED for emergency symptoms of LOC, confusion.  [name] 11/29/2012 3:21 PM

## 2012-11-30 NOTE — Telephone Encounter (Signed)
Called 12/13 and had lengthy discussion with patient about need to call and set up follow-up appointment every few weeks until seen by [facility].  Called [facility] but was unable to obtain sooner appt for patient, so asked them to call her with any cancellations.  Pt aware of emergency reasons to return sooner or go to ED.  Told her we would re-evaluate medication changes (for nausea, headaches) and ability to return to work when I see her again.  For now, cannot recommend she return to work safely given severity of symptoms at last visit.  [name] 11/30/2012 9:35 AM

## 2012-12-02 ENCOUNTER — Telehealth: Payer: Self-pay | Admitting: Family Medicine

## 2012-12-02 NOTE — Telephone Encounter (Signed)
Message copied by Barnie Alderman on Mon Dec 02, 2012 10:34 AM ------      Message from: [name] T      Created: Fri Nov 29, 2012  3:21 PM      Regarding: Records from [place]       Can you please get records from [place] for [name]? Thx!            :)Byrd Hesselbach

## 2012-12-02 NOTE — Telephone Encounter (Signed)
LMOM advising pt to call us back. If she does please ask her to come in and fill out a ROI for [facility] so Dr Briant Sites can get her records. Fax # for release to go to is [phone]. Thanks

## 2012-12-05 IMAGING — CT CT HEART MORP W/ CTA COR W/ SCORE W/ CA W/CM &/OR W/O CM
2 of 6 series · 10 of 20 positions shown, 11 images · IV contrast (CONTRAST)
Comparison: Plain film chest of earlier in the day.

INDICATION: Near syncope.  Hyperventilation.

CT ANGIOGRAPHY OF THE HEART, CORONARY ARTERY, STRUCTURE, AND
MORPHOLOGY
CONTRAST: 80mL OMNIPAQUE IOHEXOL 350 MG/ML SOLN
TECHNIQUE: CT angiography of the coronary vessels was performed on
a 256 channel system using prospective ECG gating.  A scout and
noncontrast exam (for calcium scoring) were performed.  Circulation
time was measured using a test bolus.  Coronary CTA was performed
with sub mm slice collimation during portions of the cardiac cycle
after prior injection of iodinated contrast.  Imaging post
processing was performed on an independent workstation creating
multiplanar and 3-D images, and quantitative analysis of the heart
and coronary arteries.  Note that this exam targets the heart and
the chest was not imaged in its entirety.
PREMEDICATION:
Lopressor 100 mg, P.O.
Lopressor 5.0 mg, IV
Nitroglycerin 0.4 mcg, sublingual.

[Series 3: cac thin · axial · 0.49mm/px · z∈[-153,-113]mm · 2 of 96 slices shown, 3 images]
[im 32/96  vessel]
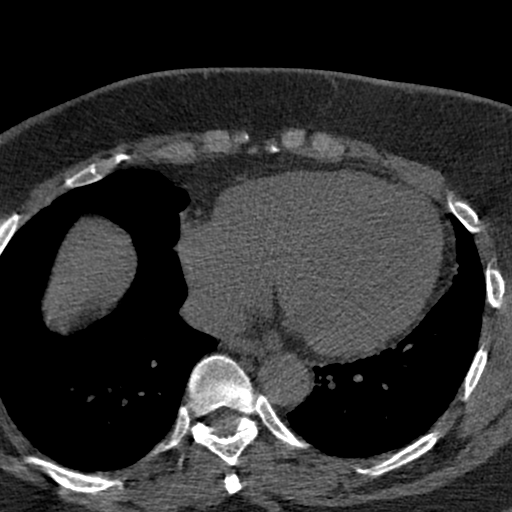
[im 32/96  lung]
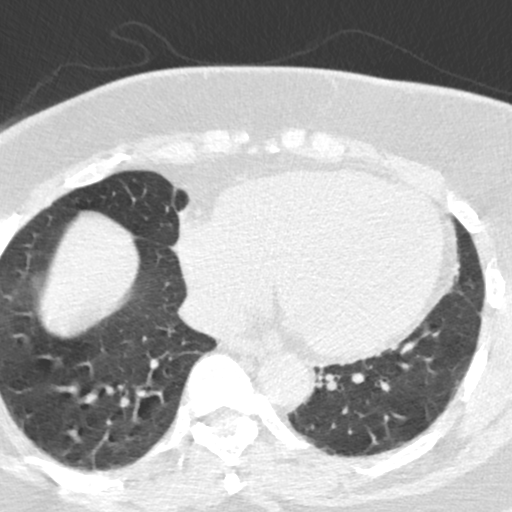
[im 64/96  vessel]
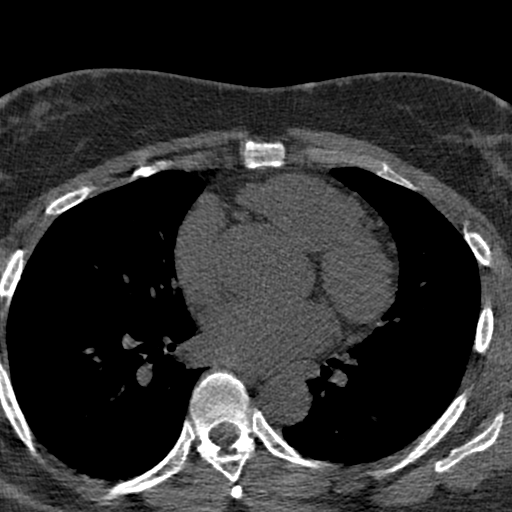

[Series 8: 78% w/o ec, 78.0% · axial · non-contrast · 0.49mm/px · z∈[-182,-83]mm · 8 of 311 slices shown]
[im 32/311  vessel]
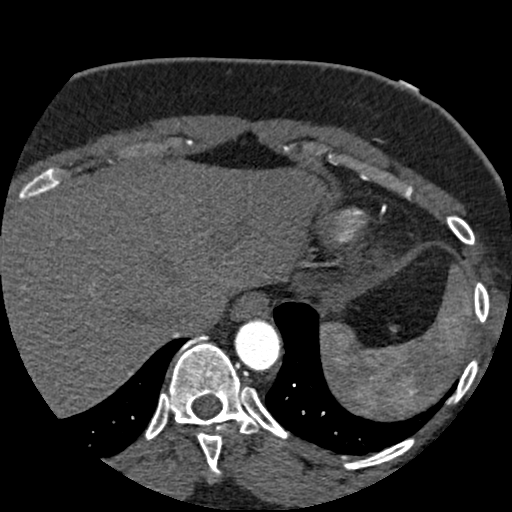
[im 63/311  vessel]
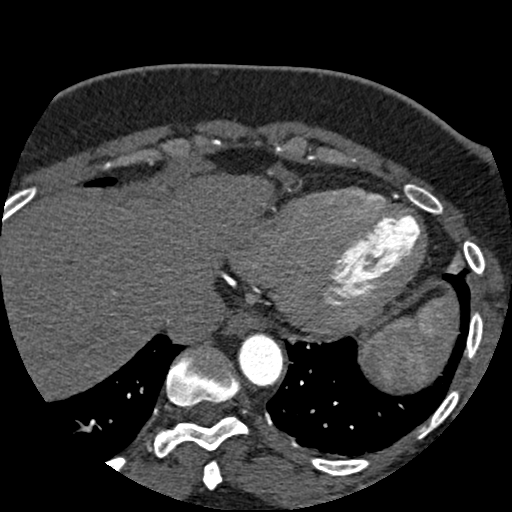
[im 94/311  vessel]
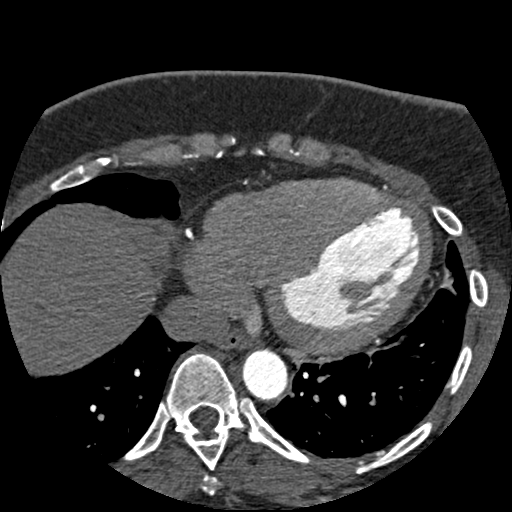
[im 125/311  vessel]
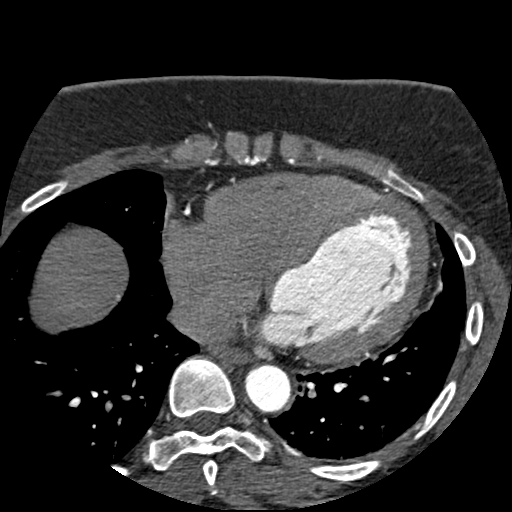
[im 187/311  vessel]
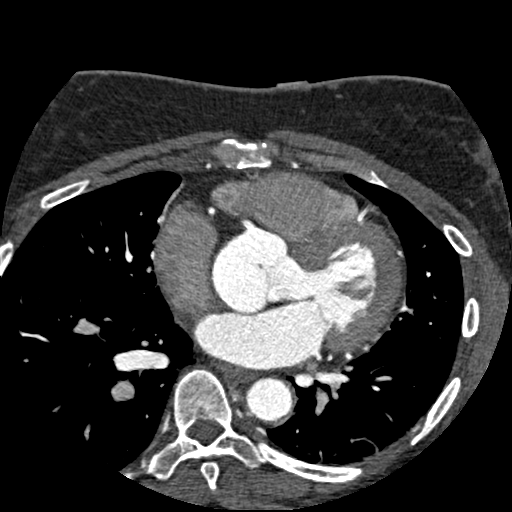
[im 218/311  vessel]
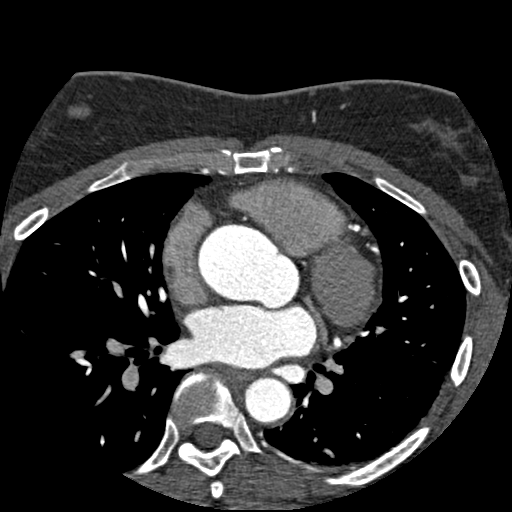
[im 249/311  vessel]
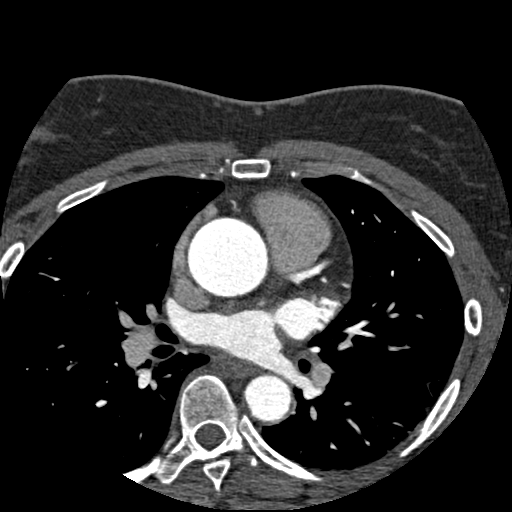
[im 280/311  vessel]
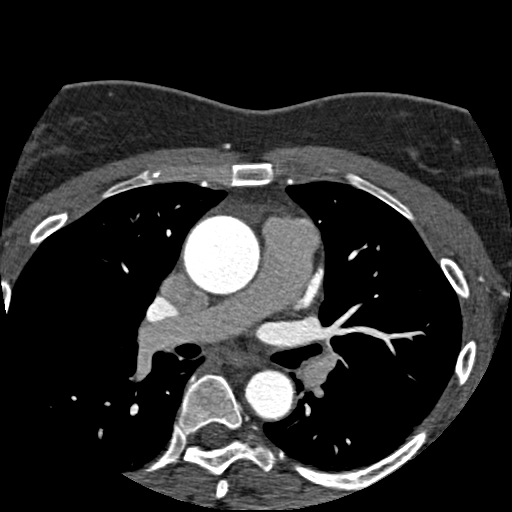

[10 of 20 positions shown; findings below may reference images not displayed]

FINDINGS: Technical quality:  Good

Heart rate:  66

CORONARY ARTERIES:
Left main coronary artery:  Moderate length vessel which arises
from the left coronary cusp and is without significant disease.
Left anterior descending:  Gives rise to a moderate sized first
diagonal, which is normal.  Immediately undergoes a short segment
of myocardial bridging, without significant stenosis.  Continues
distally to give off a tiny patent secondary diagonal and wraps
around the cardiac apex..
Left circumflex:  Moderate sized, nondominant vessel.  Gives rise
to a small first marginal, which is patent.  Then gives rise to a
second, moderate-sized branching marginal which is unremarkable.
Continues as a small AV groove branch..
Right coronary artery:  Moderate sized, dominant vesicle which
arises from the right coronary cusp.  Gives rise to a small acute
marginal branch..
Posterior descending artery:  Relatively small, patent.
Dominance:  Right-sided

CORONARY CALCIUM:
Total Agatston Score:  0

CARDIAC :  The heart is mildly enlarged.  No pericardial effusion.
No left atrial appendage thrombus.  No cardiac mass.  No septal
defect.

AORTA AND PULMONARY MEASUREMENTS:
Aortic root (21 - 40 mm):
            25  at the annulus
            42  at the sinuses of Valsalva
            35  at the sinotubular junction
Ascending aorta ( <  40 mm):  37
Descending aorta ( <  40 mm):  23
Main pulmonary artery:  ( <  30 mm):  24

EXTRACARDIAC FINDINGS:
Lung windows demonstrate no airspace opacities.

Soft tissue windows demonstrate no imaged thoracic adenopathy. No
central pulmonary embolism, on this non-dedicated study.  No aortic
dissection. No pericardial or pleural effusion.

Limited abdominal imaging demonstrates no significant findings.  No
acute osseous abnormality.
IMPRESSION: 1.  No coronary artery disease.  The patient's total coronary
artery calcium score.
2.  Extracardiac findings pertinent only for mild dilatation of the
sinuses of Valsalva. Mild cardiomegaly.
 3.  Right-sided coronary artery dominance.

Dr. Seaman was also present for the performance of this study.

Report was called to [REDACTED] mid level at [DATE] p.m. on 10/18/2012.

## 2012-12-13 ENCOUNTER — Ambulatory Visit: Payer: PRIVATE HEALTH INSURANCE | Attending: Family Medicine

## 2012-12-13 DIAGNOSIS — R262 Difficulty in walking, not elsewhere classified: Secondary | ICD-10-CM | POA: Insufficient documentation

## 2012-12-13 DIAGNOSIS — M6281 Muscle weakness (generalized): Secondary | ICD-10-CM | POA: Insufficient documentation

## 2012-12-13 DIAGNOSIS — IMO0001 Reserved for inherently not codable concepts without codable children: Secondary | ICD-10-CM | POA: Insufficient documentation

## 2012-12-13 DIAGNOSIS — R5381 Other malaise: Secondary | ICD-10-CM | POA: Insufficient documentation

## 2012-12-20 ENCOUNTER — Telehealth: Payer: Self-pay | Admitting: *Deleted

## 2012-12-20 ENCOUNTER — Ambulatory Visit (INDEPENDENT_AMBULATORY_CARE_PROVIDER_SITE_OTHER): Payer: PRIVATE HEALTH INSURANCE | Admitting: Family Medicine

## 2012-12-20 VITALS — BP 133/81 | HR 80 | Temp 98.7°F | Ht 64.0 in | Wt 162.0 lb

## 2012-12-20 DIAGNOSIS — F411 Generalized anxiety disorder: Secondary | ICD-10-CM

## 2012-12-20 DIAGNOSIS — I10 Essential (primary) hypertension: Secondary | ICD-10-CM

## 2012-12-20 DIAGNOSIS — R51 Headache: Secondary | ICD-10-CM

## 2012-12-20 DIAGNOSIS — F419 Anxiety disorder, unspecified: Secondary | ICD-10-CM

## 2012-12-20 NOTE — Telephone Encounter (Addendum)
Received phone call from [employer] at [facility] pharmacy .  Patient is there now and is confused about her meds. She just received refill on metoprolol , but this is not on med list.  . RX for Norvasc is on med list but office note in August states patient was having nausea and this was stopped and metoprolol was started. Consulted with Dr. Benjamin Stain and she will call Crystal to discuss. Phone #  [phone].  She will be there the rest of the afternoon.   Crystal will not be in office Monday . Crystal wants a current printed copy of med list . Fax to [phone]

## 2012-12-20 NOTE — Patient Instructions (Addendum)
It was good to see you today. - Please see me again for a checkup in 1-2 weeks. - You look a little better than last time. - Please keep the neurologist appointment in February. - As always, if anything worsens, please see me sooner or go to the ED with any concerns. - We will try to get your records from [place]. - Please continue PT.

## 2012-12-20 NOTE — Telephone Encounter (Signed)
Dawn at [facility] calling.  Needs patient's updated med list faxed to their office.  Med list from Epic faxed to [phone].  Gaylene Brooks, RN
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
## 2012-12-21 NOTE — Telephone Encounter (Signed)
Thank you for helping take care of this, Maria Griffith and Parker.  Patient is on norvasc and accuretic.  Metoprolol was discontinued when patient saw Dr. Armen Pickup in November.  Clarified with Maria Griffith after attempted to call Crystal and did not get through.  Thank you.  Let me know if you need anything further.

## 2012-12-22 NOTE — Assessment & Plan Note (Signed)
BP fairly well-controlled on norvasc and accuretic - Continue current management

## 2012-12-22 NOTE — Assessment & Plan Note (Addendum)
Pt following up with [facility] - Per patient, discontinued abilify and citalopram and started lexapro 10mg  daily and hydroxyzine 50mg  qhs - Re-filled out release of information to try and get records from [place] - Pt on albuterol due to report of "wheezing" - evaluate at f/u visit and consider discontinuing if unnecessary as can worsen patient's anxiety

## 2012-12-22 NOTE — Assessment & Plan Note (Addendum)
Headache appears to be stable to mildly worsened from prior visit. Dizziness/gait imbalance is also stable to mildly improved.  BP is fairly well-controlled therefore unlikely to be contributing to headaches.  Likely migraine with probable anxiety component. - Given recent medication changes through psychiatry (stopped abilify and citalopram, started lexapro 10mg  daily and hydroxyzine 50mg  daily), continue topiramate 50mg  BID for now to see if any improvement in headache and not overwhelm patient with medication changes. - Consider restarting imitrex at f/u visit vs trial propranolol for migraine ppx and BP control - Maintain early Feb [facility] neurology appt - Continue zofran for nausea given phenergan causes itching; added phenergan to list of allergies - Retry obtaining [facility] records (pt re-filled out release of information) to better understand anxiety component in symptoms - Consider geriatric clinic for imbalance - refer at f/u visit once get [facility] records - F/u in 2 weeks

## 2012-12-23 NOTE — Telephone Encounter (Signed)
Faxed this note and med list to [employer] .

## 2012-12-25 ENCOUNTER — Ambulatory Visit: Payer: PRIVATE HEALTH INSURANCE | Attending: Family Medicine

## 2012-12-25 DIAGNOSIS — R5381 Other malaise: Secondary | ICD-10-CM | POA: Insufficient documentation

## 2012-12-25 DIAGNOSIS — M6281 Muscle weakness (generalized): Secondary | ICD-10-CM | POA: Insufficient documentation

## 2012-12-25 DIAGNOSIS — R262 Difficulty in walking, not elsewhere classified: Secondary | ICD-10-CM | POA: Insufficient documentation

## 2012-12-25 DIAGNOSIS — IMO0001 Reserved for inherently not codable concepts without codable children: Secondary | ICD-10-CM | POA: Insufficient documentation

## 2012-12-27 ENCOUNTER — Encounter: Payer: PRIVATE HEALTH INSURANCE | Admitting: Physical Therapy

## 2012-12-30 ENCOUNTER — Telehealth: Payer: Self-pay | Admitting: Family Medicine

## 2012-12-30 NOTE — Telephone Encounter (Signed)
Pt is having another headache and Topamax is not helping.  Wants to speak with nurse to see what else she can take.

## 2012-12-30 NOTE — Telephone Encounter (Signed)
Patient informed she can take ibuprofen for headache (2 tablets with food every 6-8 hours as needed for headache).  Patient will call back for work-in appt as needed.  Otherwise, will follow up with PCP at appt next week.  Gaylene Brooks, RN

## 2012-12-30 NOTE — Telephone Encounter (Signed)
Patient may take ibuprofen for headache.  JB

## 2012-12-30 NOTE — Telephone Encounter (Signed)
Returned call to patient.  Was Rx'd Topamax BID.  Was given new med by [facility]---Gabapentin 300 mg TID and Lexapro 20 mg daily.  Started new meds last week.  Abilify and Celexa were d/c'd last week due to side effects.  Abilify and Topamax "seemed to help headaches."  Topamax alone "doesn't seem to help the headaches."  Patient has a neurology appt at [facility] on 01/24/13.  Patient wants to know if there is anything that she can take OTC (ex: ibuprofen) with Topamax.  Will discuss with preceptor (Dr. Mauricio Po) and call patient back.  Gaylene Brooks, RN

## 2013-01-03 ENCOUNTER — Ambulatory Visit: Payer: PRIVATE HEALTH INSURANCE | Admitting: Physical Therapy

## 2013-01-10 ENCOUNTER — Ambulatory Visit (INDEPENDENT_AMBULATORY_CARE_PROVIDER_SITE_OTHER): Payer: Self-pay | Admitting: Family Medicine

## 2013-01-10 ENCOUNTER — Ambulatory Visit: Payer: PRIVATE HEALTH INSURANCE | Admitting: Physical Therapy

## 2013-01-10 ENCOUNTER — Encounter: Payer: Self-pay | Admitting: Family Medicine

## 2013-01-10 VITALS — BP 126/84 | HR 82 | Temp 98.4°F | Ht 64.0 in | Wt 175.0 lb

## 2013-01-10 DIAGNOSIS — R51 Headache: Secondary | ICD-10-CM

## 2013-01-10 DIAGNOSIS — G8929 Other chronic pain: Secondary | ICD-10-CM

## 2013-01-10 DIAGNOSIS — R0602 Shortness of breath: Secondary | ICD-10-CM

## 2013-01-10 DIAGNOSIS — F419 Anxiety disorder, unspecified: Secondary | ICD-10-CM

## 2013-01-10 DIAGNOSIS — F411 Generalized anxiety disorder: Secondary | ICD-10-CM

## 2013-01-10 NOTE — Assessment & Plan Note (Addendum)
Nonsmoker continues having shortness of breath and is using albuterol daily.  Evaluation by cardiology in November was relatively WNL with mild AI and mild LVH, with only recommendation to repeat echo in 1 year. - Recommended only using albuterol if necessary due to risk of desensitization, and to make appointment with Dr. Raymondo Band for PFT to evaluate for obstructive pulmonary disease. - F/u with me after PFT

## 2013-01-10 NOTE — Assessment & Plan Note (Addendum)
Headache appears unchanged, and symptoms of dizziness/imbalance remain unchanged. New complaints of right hand numbness and left-sided weakness seem inconsistent with stroke.  Pt recently seen by [facility] neurology with recent increase in topiramate to 200mg  qhs that pt has only tried for 2 days. - Continue topiramate 200mg  qhs per neurology recommendations - Obtain records from [facility] neurology - F/u with neurology as planned - Numbness in hand may be due to topiramate; continue gabapentin per neurology - Send relevant paperwork from recent hospitalization and visits to [facility] Neu[facility] - Told patient to obtain CDs of her imaging studies done during hospitalization and send to [facility] Neurology - Consider propranolol for migrain ppx or imitrex for rescue medication if continues having no improvement. - Discuss sleep hygiene, caffeine intake, medication overuse at f/u as potential causes of chronic headache.

## 2013-01-10 NOTE — Assessment & Plan Note (Addendum)
Suspect symptoms of headache and dizziness/imbalance may be related to anxiety/psychiatric component of illness.  Pt continues complaining of anxiety.  Being managed by [facility]. - Continue trying to get records from [place]. - Continue lexapro and hydroxyzine per their recommendations. - Shortness of breath possibly due to anxiety

## 2013-01-10 NOTE — Progress Notes (Addendum)
Subjective:     Patient ID: Maria Griffith, female   DOB: 05/18/1961, 52 y.o.   MRN: [id number]  CC - Follow-up  HPI  Maria Griffith is a 52 y.o. female with h/o anxiety, HTN, chronic headache and hospitalization in November 2013 for syncope with relatively WNL cardiac and neurologic workup here for follow-up of her headaches and dizziness/weakness.  Pt accompanied by fiancee.  Both report the following complaints and no improvement in symptoms.    - Headaches - Pt was able to get earlier neurologist appointment though reports it was just a "consultation" and they can only see her back in 4 months.  Also reports topiramate was increased by neurologist to 200mg  QHS 2 days ago (from 50mg  BID); patient has also been using ibuprofen.  Headaches remain 7/10 pain but 10/10 pain later in the day.  Nothing seems to help including medications and heating pad.  - Dizziness continues with no improvement.  Also reports imbalance.  States that this has been a completely new symptom since hospitalization in November.  She uses cane for short distances and walker for long distances, as she reports prolonged standing worsens dizziness.  She feels gait has slightly improved since last visit.  Working with PT at home (per their documentation, will work with pt until 1/31).  - New right hand tingling - Pt states this is new since the increase in topiramate and wonders if that is causing tingling and dizziness.  Neurologist gave gabapentin to help with tingling but pt and fiancee wonder if it is causing it instead.  - Anxiety - not improved and fiancee states pt gets more aggravated than usual.  Per pt, neurologist feels previous syncope was from anxiety/stress/PTSD.  Also reports shortness of breath requiring albuterol inhaler use daily; bilateral LE edema and feeling like she has gained weight; and left-sided increased weakness.  Denies sudden weakness, slurred speech, chest pain, syncope, and vision changes.  Pt  and fiancee are concerned about medications causing side effects and states that she feels like a [place] pig.  Review of Systems Per HPI.  PMH, SH, and FH reviewed.  Medications as follows: - Topiramate 200mg  qhs - Lexapro 20mg  dialy - Gabapentin 300mg  TID - accuretic 20-12.5mg  daily - Amlodipine 5mg  daily - Hydroxizine 50mg  qhs     Objective:   Physical Exam BP 126/84  Pulse 82  Temp 98.4 F (36.9 C) (Oral)  Ht 5\' 4"  (1.626 m)  Wt 175 lb (79.379 kg)  BMI 30.04 kg/m2 GEN: NAD, sunglasses on in office, appears more comfortable than previous visit. NEURO: Alert, CN 2-12 tested and in tact, EOMI, PERRLA, Pt seen walking into clinic with minimal difficulty, though gait a bit slowed.  On exam, pt displays heightened ataxia that is inconsistent with gait walking into office, and pt pauses stating she is dizzy; speech fluent; strength 3-4/5 bilateral LE to knee extension and flexion; strength 5/5 bilateral UE EXTR: LE with trace bilateral pitting edema    Assessment:     Maria Griffith is a 52 y.o. female with h/o anxiety, HTN, chronic headache and hospitalization in November 2013 for syncope with relatively WNL cardiac and neurologic workup here for follow-up of her headaches and dizziness/weakness.      Plan:

## 2013-01-10 NOTE — Patient Instructions (Addendum)
Please continue following up with your neurologist and psychiatrist.  Maria Griffith can get medical records from our front office and your imaging from [facility].  I will mail you a copy of the reports of your imaging from the hospital but you have to go to [facility]'s medical records to request a CD of your images.  Go to the main hospital and ask there where medical records is.  Continue medications as prescribed.  Please as you are leaving set up an appointment with Dr. Raymondo Band for asthma evaluation with pulmonary function testing.

## 2013-01-14 ENCOUNTER — Telehealth: Payer: Self-pay | Admitting: Family Medicine

## 2013-01-14 ENCOUNTER — Encounter: Payer: Self-pay | Admitting: Family Medicine

## 2013-01-14 NOTE — Telephone Encounter (Signed)
Left message on patient's home answering machine that letter is ready for her and at [facility] front desk, and that office re-opens at 1030 AM tomorrow.

## 2013-01-14 NOTE — Telephone Encounter (Signed)
Patient is calling back on the below message.  She really wanted to pick the letter up today.

## 2013-01-14 NOTE — Telephone Encounter (Signed)
I did not have this patient call in my inbasket this morning for some reason - I was in clinic this morning and could have done it then, so I apologize if it was there and I did not see it but checked my inbasket while in clinic this AM and did not see it.  In any case, working on letter now and it will be routed to [facility] ADMIN. Thanks. Byrd Hesselbach

## 2013-01-14 NOTE — Telephone Encounter (Signed)
Was here last week and needs a letter stating when she was taken out of work and reason why.  Would like to pick up today so she can take it to [facility].  Cannot get the orange card until March

## 2013-01-15 ENCOUNTER — Encounter: Payer: Self-pay | Admitting: Family Medicine

## 2013-01-16 ENCOUNTER — Ambulatory Visit: Payer: Self-pay | Admitting: Pharmacist

## 2013-01-16 ENCOUNTER — Telehealth: Payer: Self-pay | Admitting: Family Medicine

## 2013-01-16 MED ORDER — ALBUTEROL SULFATE HFA 108 (90 BASE) MCG/ACT IN AERS: 2.0000 | INHALATION_SPRAY | Freq: Four times a day (QID) | RESPIRATORY_TRACT | Status: DC | PRN

## 2013-01-16 NOTE — Telephone Encounter (Signed)
Called patient to discuss refill request for albuterol inhaler and need for appointment to evaluate for asthma/obstructive lung disease given previously documented high albuterol use.  Patient stated she had cancelled her PFT evaluation with Dr. Raymondo Band and has not made f/u appt with me due to having lost [facility] Card eligibility and being declined for disability coverage.  States that my not listing "PTSD" in a recent letter I wrote for her "set me back," prevented her from getting disability coverage, states that since then she has lost her apt, has no income, was denied worker's compensation, and is in "bad shape."  Patient seems displeased and I can also hear her fiance in the background prompting her on what to say.  Since then, her psychiatrist has written a letter stating she has PTSD per pt, but she states she needs one from her primary doctor.   - I explained to pt that we have requested records from [facility] twice now and have not received them.  Without them, I can only write what I know.  Described this is team-based and therefore the specialists she sees (Neurology, Psychiatry) are a great help to clarifying diagnosis.   - Informed patient that sent referral letter to [facility] with information about patient's studies, and sent copy of this letter to pt.  Asked her to let me know what questions she has when she receives this. - Write another letter once get psychiatrist notes and asked patient to follow-up on this with me. - Recommended patient find out about sliding scale at our clinic and prioritize a visit for pulmonary function testing.  Discussed reasons for going to ED (if pt is in respiratory distress).  Until then, refilling 1 albuterol inhaler and recommend only using when needed. - Pt verbalized understanding.  [name] 01/16/2013 2:23 PM

## 2013-01-20 ENCOUNTER — Encounter: Payer: Self-pay | Admitting: Family Medicine

## 2013-01-20 DIAGNOSIS — F331 Major depressive disorder, recurrent, moderate: Secondary | ICD-10-CM | POA: Insufficient documentation

## 2013-01-20 DIAGNOSIS — F4310 Post-traumatic stress disorder, unspecified: Secondary | ICD-10-CM | POA: Insufficient documentation

## 2013-01-21 ENCOUNTER — Encounter: Payer: Self-pay | Admitting: Family Medicine

## 2013-01-21 DIAGNOSIS — G4700 Insomnia, unspecified: Secondary | ICD-10-CM | POA: Insufficient documentation

## 2013-01-24 ENCOUNTER — Encounter: Payer: Self-pay | Admitting: Family Medicine

## 2013-02-12 ENCOUNTER — Other Ambulatory Visit: Payer: Self-pay | Admitting: Family Medicine

## 2013-02-12 MED ORDER — ALBUTEROL SULFATE HFA 108 (90 BASE) MCG/ACT IN AERS: 2.0000 | INHALATION_SPRAY | Freq: Four times a day (QID) | RESPIRATORY_TRACT | Status: DC | PRN

## 2013-02-12 NOTE — Telephone Encounter (Signed)
Received refill authorization request from [facility] for albuterol. Had last asked for follow-up with MD prior to more refills and had just given 1 inhaler. Due to risk of respiratory compromise with no rescue inhaler, will authorize 1 inhaler with 0 refills and have my team call patient to try to schedule follow-up appointment with me.  [name] 02/12/2013 5:30 PM

## 2013-02-17 ENCOUNTER — Encounter: Payer: Self-pay | Admitting: Family Medicine

## 2013-02-17 DIAGNOSIS — E663 Overweight: Secondary | ICD-10-CM | POA: Insufficient documentation

## 2013-02-17 DIAGNOSIS — Z9851 Tubal ligation status: Secondary | ICD-10-CM

## 2013-02-17 DIAGNOSIS — L52 Erythema nodosum: Secondary | ICD-10-CM

## 2013-02-17 DIAGNOSIS — N393 Stress incontinence (female) (male): Secondary | ICD-10-CM | POA: Insufficient documentation

## 2013-02-17 DIAGNOSIS — D649 Anemia, unspecified: Secondary | ICD-10-CM | POA: Insufficient documentation

## 2013-02-17 DIAGNOSIS — Z6835 Body mass index (BMI) 35.0-35.9, adult: Secondary | ICD-10-CM | POA: Insufficient documentation

## 2013-02-17 HISTORY — DX: Anemia, unspecified: D64.9

## 2013-02-17 HISTORY — DX: Erythema nodosum: L52

## 2013-02-17 HISTORY — DX: Tubal ligation status: Z98.51

## 2013-03-07 ENCOUNTER — Encounter: Payer: Self-pay | Admitting: Pharmacist

## 2013-03-07 ENCOUNTER — Ambulatory Visit (INDEPENDENT_AMBULATORY_CARE_PROVIDER_SITE_OTHER): Payer: PRIVATE HEALTH INSURANCE | Admitting: Family Medicine

## 2013-03-07 ENCOUNTER — Ambulatory Visit (INDEPENDENT_AMBULATORY_CARE_PROVIDER_SITE_OTHER): Payer: PRIVATE HEALTH INSURANCE | Admitting: Pharmacist

## 2013-03-07 ENCOUNTER — Encounter: Payer: Self-pay | Admitting: Family Medicine

## 2013-03-07 VITALS — BP 132/90 | HR 75 | Ht 62.0 in | Wt 174.0 lb

## 2013-03-07 VITALS — BP 123/81 | HR 86 | Temp 98.2°F | Ht 62.0 in | Wt 174.6 lb

## 2013-03-07 DIAGNOSIS — J4540 Moderate persistent asthma, uncomplicated: Secondary | ICD-10-CM

## 2013-03-07 DIAGNOSIS — R269 Unspecified abnormalities of gait and mobility: Secondary | ICD-10-CM

## 2013-03-07 DIAGNOSIS — J45909 Unspecified asthma, uncomplicated: Secondary | ICD-10-CM

## 2013-03-07 DIAGNOSIS — Z23 Encounter for immunization: Secondary | ICD-10-CM

## 2013-03-07 DIAGNOSIS — R0602 Shortness of breath: Secondary | ICD-10-CM

## 2013-03-07 DIAGNOSIS — I10 Essential (primary) hypertension: Secondary | ICD-10-CM

## 2013-03-07 MED ORDER — BECLOMETHASONE DIPROPIONATE 80 MCG/ACT IN AERS: 2.0000 | INHALATION_SPRAY | Freq: Two times a day (BID) | RESPIRATORY_TRACT | Status: DC

## 2013-03-07 MED ORDER — AMLODIPINE BESYLATE 5 MG PO TABS: 5.0000 mg | ORAL_TABLET | Freq: Every day | ORAL | Status: DC

## 2013-03-07 MED ORDER — TOPIRAMATE 200 MG PO TABS: 200.0000 mg | ORAL_TABLET | Freq: Two times a day (BID) | ORAL | Status: DC

## 2013-03-07 MED ORDER — QUINAPRIL-HYDROCHLOROTHIAZIDE 20-12.5 MG PO TABS: 1.0000 | ORAL_TABLET | Freq: Every day | ORAL | Status: DC

## 2013-03-07 MED ORDER — PREDNISONE 20 MG PO TABS: 40.0000 mg | ORAL_TABLET | Freq: Every day | ORAL | Status: DC

## 2013-03-07 NOTE — Patient Instructions (Addendum)
It was good to see you today. Thank you for bringing your medications with you to clinic.  We performed some lung function tests today to assess your breathing. We shared the results with you and Dr. Karie Schwalbe.

## 2013-03-07 NOTE — Assessment & Plan Note (Addendum)
Spirometry evaluation reveals Moderate restrictive lung disease with significant improvement in FVC.  Patient has been experiencing shortness of breath and coughing  for 3-4 months and is  taking albuterol HFA as needed. Began Qvar 80 2 puffs BID at this time and initiated a 5 day course of prednisone 40 mg daily.  Reviewed results of pulmonary function tests.  Pt verbalized understanding of results.  Written pt instructions provided.  F/U with Dr. Benjamin Stain.   Total time in face to face counseling 60 minutes.  Patient seen with Tomi Bamberger PharmD, Pharmacy Resident and Beatrix Shipper, PharmD candidate.
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
## 2013-03-07 NOTE — Patient Instructions (Addendum)
It was great to see you today Maria Griffith!  You were diagnosed with asthma today. - Please take prednisone 40mg  (2 tablets) daily x 5 days. - Please use QVAR inhaler 2 puffs twice daily every day. - Please use albuterol 3 times daily as you were using it, for another 2-3 days. Then try to slowly decrease. - I want to see you back to re-evaluate your breathing in 2-3 weeks.  For your b ack pain, this could be due to your coughing. The prednisone may help.  - Follow up as needed with me to evaluate. - You can also try tylenol (not to exceed recommended dose).  For your imbalance, I have put in a referral to geriatric clinic with Maria Griffith. If you do not get a call in the next week, let me know.  I will refill your medications and send them to the pharmacies.

## 2013-03-08 ENCOUNTER — Emergency Department (HOSPITAL_COMMUNITY)
Admission: EM | Admit: 2013-03-08 | Discharge: 2013-03-08 | Disposition: A | Discharge status: Home / Self Care | Payer: PRIVATE HEALTH INSURANCE | Attending: Emergency Medicine | Admitting: Emergency Medicine

## 2013-03-08 ENCOUNTER — Encounter: Payer: Self-pay | Admitting: Family Medicine

## 2013-03-08 DIAGNOSIS — M62838 Other muscle spasm: Secondary | ICD-10-CM | POA: Insufficient documentation

## 2013-03-08 DIAGNOSIS — G8929 Other chronic pain: Secondary | ICD-10-CM

## 2013-03-08 DIAGNOSIS — M542 Cervicalgia: Secondary | ICD-10-CM | POA: Insufficient documentation

## 2013-03-08 DIAGNOSIS — M6281 Muscle weakness (generalized): Secondary | ICD-10-CM | POA: Insufficient documentation

## 2013-03-08 DIAGNOSIS — R609 Edema, unspecified: Secondary | ICD-10-CM | POA: Insufficient documentation

## 2013-03-08 DIAGNOSIS — R42 Dizziness and giddiness: Secondary | ICD-10-CM | POA: Insufficient documentation

## 2013-03-08 DIAGNOSIS — R0602 Shortness of breath: Secondary | ICD-10-CM | POA: Insufficient documentation

## 2013-03-08 DIAGNOSIS — G8921 Chronic pain due to trauma: Secondary | ICD-10-CM | POA: Insufficient documentation

## 2013-03-08 DIAGNOSIS — R51 Headache: Secondary | ICD-10-CM | POA: Insufficient documentation

## 2013-03-08 DIAGNOSIS — Z872 Personal history of diseases of the skin and subcutaneous tissue: Secondary | ICD-10-CM | POA: Insufficient documentation

## 2013-03-08 DIAGNOSIS — Z79899 Other long term (current) drug therapy: Secondary | ICD-10-CM | POA: Insufficient documentation

## 2013-03-08 DIAGNOSIS — M549 Dorsalgia, unspecified: Secondary | ICD-10-CM | POA: Insufficient documentation

## 2013-03-08 DIAGNOSIS — R262 Difficulty in walking, not elsewhere classified: Secondary | ICD-10-CM | POA: Insufficient documentation

## 2013-03-08 DIAGNOSIS — I10 Essential (primary) hypertension: Secondary | ICD-10-CM | POA: Insufficient documentation

## 2013-03-08 DIAGNOSIS — Z8679 Personal history of other diseases of the circulatory system: Secondary | ICD-10-CM | POA: Insufficient documentation

## 2013-03-08 DIAGNOSIS — M25519 Pain in unspecified shoulder: Secondary | ICD-10-CM | POA: Insufficient documentation

## 2013-03-08 MED ORDER — DIAZEPAM 5 MG PO TABS
5.0000 mg | ORAL_TABLET | Freq: Once | ORAL | Status: AC
  Administered 2013-03-08: 5 mg via ORAL
  Filled 2013-03-08: qty 1

## 2013-03-08 MED ORDER — HYDROMORPHONE HCL PF 1 MG/ML IJ SOLN
1.0000 mg | Freq: Once | INTRAMUSCULAR | Status: AC
  Administered 2013-03-08: 1 mg via INTRAMUSCULAR
  Filled 2013-03-08: qty 1

## 2013-03-08 MED ORDER — DIAZEPAM 5 MG PO TABS: 5.0000 mg | ORAL_TABLET | Freq: Three times a day (TID) | ORAL | Status: DC | PRN

## 2013-03-08 MED ORDER — OXYCODONE-ACETAMINOPHEN 5-325 MG PO TABS: 1.0000 | ORAL_TABLET | ORAL | Status: DC | PRN

## 2013-03-08 NOTE — Assessment & Plan Note (Addendum)
Spirometry evaluation today with moderate persistent asthma. Wheezing this AM but no wheeze in PM after nebulized albuterol. Some of neck/back pain likely also related to coughing and MSK stress. - QVAR 2 puffs BID - Discussed with patient who prefers to take prednisone 5-day course vs not taking this to help with current persistent asthma (may also help with back pain) - Recommended continuing using albuterol 2-3 times daily for 2 days, then decreasing to use as needed - Provided asthma education including difference between daily QVAR and emergency inhaler albuterol, QVAR teaching (pharm resident) - Goal is to sleep through night and perform ADLs without becoming SOB
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
## 2013-03-08 NOTE — Progress Notes (Signed)
Subjective:     Patient ID: TACORA ATHANAS, female   DOB: 1961-01-12, 52 y.o.   MRN: [id number]  CC - F/u spirometry eval and with neck pain today  HPI  SHANEEKA SCARBORO is a 51 y.o. female with h/o months of frequent albuterol use following up today after spirometry evaluation this morning.  1. Frequent albuterol use - Pt has had months of using albuterol inhaler 2-3 times daily, causing her to wake up coughing with "uncontrolled shortness of breath," get out of breath when she "runs or walks too fast," and get dizzy with no LOC. She has been coughing so much that back and chest muscles hurt and feel better when she stops coughing. She met with pharmacist Dr. Raymondo Band today for spirometry evaluation that showed moderate restrictive lung disease with significant improvement in FVC. Also had wheezing in clinic this morning, resolved after albuterol neb treatment.   Review of Systems - Denies syncope, exertional chest pain, increased sweating  PMH, SH, and FH reviewed with the following changes: - Psychiatry changed pt from lexapro to sertraline. - Abilify had worked well for psych but stopped that some time ago due to increased heart rate - Psychiatry increased gabapentin from 200 to 600mg  TID     Objective:   Physical Exam BP 123/81  Pulse 86  Temp(Src) 98.2 F (36.8 C) (Oral)  Ht 5\' 2"  (1.575 m)  Wt 174 lb 9.6 oz (79.198 kg)  BMI 31.93 kg/m2  LMP 03/02/2013  GEN: NAD, seated in exam room PULM: CTAB with no wheezes or crackles, normal effort NEURO: Alert, no focal deficits, speech slightly slowed and subdued, EOMI PSYCH: Mood "tired," affect congruent, slightly dulled; speech slightly slowed and lowered; linear thought process EXTR: Moving all four extremities equally and spontaneously, with no upper extremity pallor, size difference, or edema     Assessment:     Ms Briski is a 52 y.o. female with multiple medical problems here for f/u of frequent albuterol use and SOB.     Plan:      # Health maintenance  - TDAP given today  *Pt also wanted to discuss multiple other issues today including right shoulder/neck pain, continued gait imbalance and dizziness, continued shaking of hands, hand numbness, and continued headaches. Many of these complaints are chronic and today's visit had been scheduled for follow-up of spirometry evaluation and frequent albuterol use for uncontrolled shortness of breath. Informed pt that could see her for follow-up to give appropriate amount of time to evaluation of each issue.
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
## 2013-03-08 NOTE — Assessment & Plan Note (Signed)
Well-controlled on current regimen. - Refilled accuretic and amlodipine

## 2013-03-10 ENCOUNTER — Encounter: Payer: Self-pay | Admitting: Family Medicine

## 2013-03-10 NOTE — Progress Notes (Signed)
Patient ID: Maria Griffith, female   DOB: [birth year], 52 y.o.   MRN: [id number] Reviewed: Agree with Dr. Macky Lower documentation and management.

## 2013-03-10 NOTE — ED Provider Notes (Signed)
Medical screening examination/treatment/procedure(s) were performed by non-physician practitioner and as supervising physician I was immediately available for consultation/collaboration.   Aaleah Hirsch L Ludivina Guymon, MD 03/10/13 0550 

## 2013-03-11 ENCOUNTER — Other Ambulatory Visit: Payer: Self-pay | Admitting: Family Medicine

## 2013-03-11 ENCOUNTER — Telehealth: Payer: Self-pay | Admitting: *Deleted

## 2013-03-11 MED ORDER — FLUTICASONE PROPIONATE HFA 220 MCG/ACT IN AERO: 1.0000 | INHALATION_SPRAY | Freq: Two times a day (BID) | RESPIRATORY_TRACT | Status: DC

## 2013-03-11 MED ORDER — FLUTICASONE PROPIONATE HFA 110 MCG/ACT IN AERO: 1.0000 | INHALATION_SPRAY | Freq: Two times a day (BID) | RESPIRATORY_TRACT | Status: DC

## 2013-03-11 NOTE — Telephone Encounter (Signed)
Phone call received from [facility] from [facility]. Dr. Megan Salon had given patient an RX for Qvar but they will have to order this and patient wants something to start right away.  They have Flovent samples. Can this rx be switched to Flovent . They will need new RX faxed to  [facility] @ [phone] . Will forward to Dr. Casper Harrison.

## 2013-03-11 NOTE — Telephone Encounter (Signed)
Rx called in for Flovent 220 mcg, 1 puff BID (comparable/equivalent dose to QVAR 80 mcg, 2 puffs BID) while waiting for QVAR to be ordered. Pt should take Flovent until QVAR is available, then switch to QVAR. Will forward this to Dr. Benjamin Stain so she will be aware once she returns from vacation, as well. Pt should follow up with Dr. Benjamin Stain as planned and/or as needed. Thanks! --CMS

## 2013-03-11 NOTE — Telephone Encounter (Signed)
Thank you Dr. Casper Harrison.

## 2013-03-12 NOTE — Telephone Encounter (Signed)
Received call from [facility]  from [facility]. They are concerned about patient getting inhalers mixed up and want to check with Dr. Benjamin Stain before they order Qvar just to make sure if patient can just stay on Flovent. Advised that MD will be back  On 03/31 and will send message to her about this.  Call back  [phone].

## 2013-03-14 ENCOUNTER — Ambulatory Visit (INDEPENDENT_AMBULATORY_CARE_PROVIDER_SITE_OTHER): Payer: PRIVATE HEALTH INSURANCE | Admitting: Family Medicine

## 2013-03-14 ENCOUNTER — Encounter: Payer: Self-pay | Admitting: Family Medicine

## 2013-03-14 VITALS — BP 143/94 | HR 75 | Temp 98.2°F | Ht 62.0 in | Wt 174.0 lb

## 2013-03-14 DIAGNOSIS — M25519 Pain in unspecified shoulder: Secondary | ICD-10-CM

## 2013-03-14 DIAGNOSIS — M25511 Pain in right shoulder: Secondary | ICD-10-CM

## 2013-03-14 DIAGNOSIS — M4722 Other spondylosis with radiculopathy, cervical region: Secondary | ICD-10-CM | POA: Insufficient documentation

## 2013-03-14 DIAGNOSIS — M5412 Radiculopathy, cervical region: Secondary | ICD-10-CM | POA: Insufficient documentation

## 2013-03-14 MED ORDER — GABAPENTIN 600 MG PO TABS: 1200.0000 mg | ORAL_TABLET | Freq: Three times a day (TID) | ORAL | Status: DC

## 2013-03-14 MED ORDER — TRAZODONE HCL 100 MG PO TABS: 200.0000 mg | ORAL_TABLET | Freq: Every day | ORAL | Status: DC

## 2013-03-14 MED ORDER — DIAZEPAM 5 MG PO TABS: 5.0000 mg | ORAL_TABLET | Freq: Three times a day (TID) | ORAL | Status: DC | PRN

## 2013-03-14 NOTE — Patient Instructions (Addendum)
Mrs. Herbel,  Thank you for coming in today. I am concerned about possible R anterior shoulder arthritis and cervical nerve irritation.  For this please get the following: R shoulder x-ray MRI of C spine.   In addition, please make the following changes to your medication regimen: Take 200 mg of trazodone at night. Increase gabapentin: Take 2 tablets at night for the next week, one in morning, one midday Take 2 tablets at night, 2 tablets in the morning, one midday the following week. Take 2 tablets at night, 2 tablets in the morning, two midday.   Schedule f/u for next week Thursday or Friday to review imaging.  I do encourage you to stick with Dr. Karie Schwalbe.  If you want to f/u with me please request PCP change form at the front desk.   Dr. Armen Pickup

## 2013-03-16 ENCOUNTER — Encounter: Payer: Self-pay | Admitting: Family Medicine
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
## 2013-03-17 NOTE — Assessment & Plan Note (Signed)
A: sharp unilateral neck pain, radiating down arm concerning for cervical radiculopathy. Palpable swelling concerning for large muscle spasm.  P: Increase gabapentin and PM trazadone pre orders.  Stopped percocet as well as diazepam (diazepam to be taken prior to MRI of neck only) since patient reported no improvement in symptoms and is getting to a state of dangerous polypharmacy.  Ordered R shoulder x-ray to eval AC joint, ? Arthritis Also ordered MRI of C spine to eval for areas of cervical nerve impingement/irritation.  Close f/u in one week to review images and discuss treatment plan.

## 2013-03-19 ENCOUNTER — Ambulatory Visit (HOSPITAL_COMMUNITY)
Admission: RE | Admit: 2013-03-19 | Discharge: 2013-03-19 | Disposition: A | Discharge status: Home / Self Care | Payer: PRIVATE HEALTH INSURANCE | Source: Ambulatory Visit | Attending: Family Medicine | Admitting: Family Medicine

## 2013-03-19 ENCOUNTER — Other Ambulatory Visit: Payer: Self-pay | Admitting: Family Medicine

## 2013-03-19 ENCOUNTER — Telehealth: Payer: Self-pay | Admitting: Family Medicine

## 2013-03-19 DIAGNOSIS — M5412 Radiculopathy, cervical region: Secondary | ICD-10-CM

## 2013-03-19 DIAGNOSIS — R5383 Other fatigue: Secondary | ICD-10-CM | POA: Insufficient documentation

## 2013-03-19 DIAGNOSIS — M4802 Spinal stenosis, cervical region: Secondary | ICD-10-CM | POA: Insufficient documentation

## 2013-03-19 DIAGNOSIS — M25519 Pain in unspecified shoulder: Secondary | ICD-10-CM | POA: Insufficient documentation

## 2013-03-19 DIAGNOSIS — M5020 Other cervical disc displacement, unspecified cervical region: Secondary | ICD-10-CM | POA: Insufficient documentation

## 2013-03-19 DIAGNOSIS — M79609 Pain in unspecified limb: Secondary | ICD-10-CM | POA: Insufficient documentation

## 2013-03-19 DIAGNOSIS — R5381 Other malaise: Secondary | ICD-10-CM | POA: Insufficient documentation

## 2013-03-19 DIAGNOSIS — M47812 Spondylosis without myelopathy or radiculopathy, cervical region: Secondary | ICD-10-CM | POA: Insufficient documentation

## 2013-03-19 NOTE — Telephone Encounter (Signed)
Is anxious to hear about her xrays - pls advise - still in pain

## 2013-03-20 ENCOUNTER — Telehealth: Payer: Self-pay | Admitting: Family Medicine

## 2013-03-20 ENCOUNTER — Ambulatory Visit: Payer: PRIVATE HEALTH INSURANCE | Admitting: Family Medicine

## 2013-03-20 DIAGNOSIS — M4722 Other spondylosis with radiculopathy, cervical region: Secondary | ICD-10-CM

## 2013-03-20 MED ORDER — PREDNISONE 10 MG PO TABS: ORAL_TABLET | ORAL | Status: DC

## 2013-03-20 NOTE — Telephone Encounter (Signed)
Patient needs prescriptions from 03/07/13 visit resent to pharmacy.  She said they were sent to [facility] but now they are closed and she [facility] [address].

## 2013-03-20 NOTE — Telephone Encounter (Signed)
Called patient. Started to tell her about imaging results and call was dropped.  Called back and left VM. Normal shoulder x-ray Cervical canal narrowing in neck and disc disease, this is likely source of pain.  Plan: Ortho referral placed Prednisone x 12 days: 40mg  (4 tabs) x 3 days, 30 mg (3 tabs) x 3 days,  20 mg (2 tabs) x 3 days, 10 mg (1 tab) x 3 days   Asked patient to call back to clinic with question. Also stated that I am not in clinic today, but I will get back to her as soon as possible.

## 2013-03-20 NOTE — Telephone Encounter (Signed)
Not sure why this was forwarded to me.  Forwarding to last physician seen.

## 2013-03-20 NOTE — Telephone Encounter (Signed)
Patient called the emergency line asking for more information about the results of her xray and MRI.  I re-iterated what had already been communicated by Dr. Armen Pickup on a voicemail, stating that she had narrowing of her cervical canal as well as disc disease. Her husband asked what this was caused by. I told her that the narrowing was a congenital issue and that some of the disc disease could be from arthritis.  I told them to take the prednisone as directed and told them that if they had anymore questions, they should call the clinic during office hours.  They expressed understanding.   Marena Chancy, PGY-2 Family Medicine Resident

## 2013-03-20 NOTE — Telephone Encounter (Signed)
Will FWD to MD.  .Maria Griffith L  

## 2013-03-25 ENCOUNTER — Other Ambulatory Visit: Payer: Self-pay | Admitting: Family Medicine

## 2013-03-25 ENCOUNTER — Telehealth: Payer: Self-pay | Admitting: Family Medicine

## 2013-03-25 NOTE — Telephone Encounter (Signed)
Patient is calling back because the [facility] program is going to be able to get her Topamax but she will need to get the Rx for Amlodipine [facility] [address] [facility] [address].

## 2013-03-25 NOTE — Progress Notes (Signed)
See previous note re: flovent in place of QVAR due to [facility] at [facility] having flovent.

## 2013-03-25 NOTE — Telephone Encounter (Signed)
Called patient regarding amlodipine and topamax prescriptions and question re: MRI:  - Re: prescriptions, patient now states [facility] does not have topamax. I told her to avoid confusion, for her to call the number for pharmacy that [facility] to [facility] listed in previous note and let them know where she wants rx's sent. She states she already tried this and was told they could not do it. I told her to speak with Michele Mcalpine who just told me they could do this and to let us know if she had trouble.  - Re: MRI, recommendation for ortho f/u from Dr. Armen Pickup. Referral placed and [facility] RN called pt about this to let her know it may be May before appt, per pt. She understands. She has not heard from orthopedic surgeon office yet but knows to call our office if questions or does not hear from them.

## 2013-03-25 NOTE — Telephone Encounter (Signed)
By Vesta Mixer I am assuming you mean [facility] (medication assistance prgrm)?   BLUE TEAM, please call patient and let her know: - I called the number listed for [facility] [phone] [facility]) and looked into it. They state they transferred amlodipine to [facility] (per request from [facility] yesterday) and will send topamax to [facility] now.  -In the future, to avoid confusion, she can call pharmacy directly and transfer remaining prescriptions to [facility].  -It is better to have all pharmacies at 1 location if possible though I understand she may be doing this due to cost.   Thanks. Let me know if any questions.

## 2013-03-25 NOTE — Telephone Encounter (Signed)
Called [facility] at [phone] and left message stating that I am okay with continuing flovent at ordered dose in this pt. Gave them [facility] number to call back with questions or issues with rx.   The difference between flovent and QVAR seems to be patient-specific. When Ms Buffone returns to clinic, I will follow up on affordability and symptom control with flovent.

## 2013-03-25 NOTE — Telephone Encounter (Signed)
To avoid any confusion, [facility] and Michele Mcalpine who works there is graciously agreeing to call patient and clarify where she wants all medications sent.

## 2013-03-25 NOTE — Telephone Encounter (Signed)
Patient has found herself in the same problem as many other patients where [facility] has closed so she has to find another pharmacy to get her Rx's.  She would like the Topamax and Amlodipine are much less expensive through [facility] so she would like the Rx for them sent there.  The patient is totally out of both of these meds.

## 2013-03-26 ENCOUNTER — Telehealth: Payer: Self-pay | Admitting: Family Medicine

## 2013-03-26 NOTE — Telephone Encounter (Signed)
Not red team forward back to [facility]

## 2013-03-26 NOTE — Telephone Encounter (Signed)
Pt is needing this by Monday -

## 2013-03-26 NOTE — Telephone Encounter (Signed)
Patient would like to speak to Dr. Armen Pickup about a form that she needs from her.  She said it is very important.  I don't know why she is asking for Dr. Armen Pickup and not Dr. Karie Schwalbe.

## 2013-03-27 NOTE — Telephone Encounter (Signed)
Attempted to call several times. No answer the 1st time and the 2nd time it stopped ringing after the 4th ring, but no one answered. Will await callback. [place], [name]

## 2013-03-27 NOTE — Telephone Encounter (Signed)
Pt called back. She states that she has a hearing Tuesday (4/15) to try to get Medicaid and Disability.  She ask if either Dr. Armen Pickup or Dr. Benjamin Stain would be willing to write a letter on her behalf about her not being able to work.  She says that it needs to state all the things that make it impossible to work.  She also inform me that she is trying to get disability because she has been "out of work so long and her bills are pileing up."  Advised I would send a message to both MDs to see if either one would be willing to write such a letter.    Also pt is coming in to see Dr. Cristal Ford tomorrow and would like to be able to pick up the letter at that time. Maria Griffith, [name]

## 2013-03-27 NOTE — Telephone Encounter (Signed)
As Dr. Karie Schwalbe is patient's PCP I will defer letter writing to her. Patient informed. Informed patient that she should wait to hear for her PCP when letter available for pick up. Might be Monday. She is ok wit this.   Patient feels that she is unable to work any job due to her constellation of symptoms.

## 2013-03-28 ENCOUNTER — Ambulatory Visit (INDEPENDENT_AMBULATORY_CARE_PROVIDER_SITE_OTHER): Payer: PRIVATE HEALTH INSURANCE | Admitting: Family Medicine

## 2013-03-28 ENCOUNTER — Encounter: Payer: Self-pay | Admitting: Family Medicine

## 2013-03-28 ENCOUNTER — Ambulatory Visit: Payer: PRIVATE HEALTH INSURANCE | Admitting: Family Medicine

## 2013-03-28 VITALS — BP 121/83 | HR 79 | Temp 98.1°F | Ht 64.0 in | Wt 170.0 lb

## 2013-03-28 DIAGNOSIS — M542 Cervicalgia: Secondary | ICD-10-CM

## 2013-03-28 MED ORDER — IBUPROFEN 600 MG PO TABS: 600.0000 mg | ORAL_TABLET | Freq: Three times a day (TID) | ORAL | Status: DC | PRN

## 2013-03-28 MED ORDER — CERVICAL COLLAR MISC: 1.0000 | Freq: Every day | Status: DC

## 2013-03-28 NOTE — Assessment & Plan Note (Signed)
Likely musculoskeletal spasm in conjunction with cervical radiculopathy. Negative Spurling test and distal strength intact. Agree with orthopedic or neurosurgical referral given her MRI showing congenitally small central cord stenosis with some myelomalacia. This referral was already placed. Failed response to prednisone, will not repeat. Start motrin 3-4 times daily as needed. Ice/heat compression. Gave some shoulder ROM to prevent complication of adhesive capsilitis since she is trying not to use the arm. Could consider soft cervical collar for symptom improvement awaiting surgical referral.

## 2013-03-28 NOTE — Patient Instructions (Addendum)
Avoid taking albuterol (rescue inhaler) unless you are wheezing. Take qvar twice daily as prescribed.  Start taking motrin 600 mg up to four times daily. Do your exercises four times (3 sets of 20) daily like we discussed (shoulder range of motion).  Will send you to physical therapy again. Use ice and heat therapy.  You are on orthopedics referral waiting list.   Cervical Sprain A cervical sprain is when the ligaments in the neck stretch or tear. The ligaments are the tissues that hold the neck bones in place. HOME CARE   Put ice on the injured area.  Put ice in a plastic bag.  Place a towel between your skin and the bag.  Leave the ice on for 15 to 20 minutes, 3 to 4 times a day.  Only take medicine as told by your doctor.  Keep all doctor visits as told.  Keep all physical therapy visits as told.  If your doctor gives you a neck collar, wear it as told.  Do not drive while wearing a neck collar.  Adjust your work station so that you have good posture while you work.  Avoid positions and activities that make your problems worse.  Warm up and stretch before being active. GET HELP RIGHT AWAY IF:   You are bleeding or your stomach is upset.  You have an allergic reaction to your medicine.  Your problems (symptoms) get worse.  You develop new problems.  You lose feeling (numbness) or you cannot move (paralysis) any part of your body.  You have tingling or weakness in any part of your body.  Your pain is not controlled with medicine.  You cannot take less pain medicine over time as planned.  Your activity level does not improve as expected. MAKE SURE YOU:   Understand these instructions.  Will watch your condition.  Will get help right away if you are not doing well or get worse. Document Released: 05/22/2008 Document Revised: 02/26/2012 Document Reviewed: 09/07/2011 [facility] Patient Information 2013 [place], [place].
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
## 2013-03-31 ENCOUNTER — Telehealth: Payer: Self-pay | Admitting: *Deleted

## 2013-03-31 ENCOUNTER — Ambulatory Visit (INDEPENDENT_AMBULATORY_CARE_PROVIDER_SITE_OTHER): Payer: PRIVATE HEALTH INSURANCE | Admitting: Family Medicine

## 2013-03-31 ENCOUNTER — Encounter: Payer: Self-pay | Admitting: Family Medicine

## 2013-03-31 ENCOUNTER — Telehealth: Payer: Self-pay | Admitting: Family Medicine

## 2013-03-31 VITALS — BP 140/90 | HR 80 | Ht 64.0 in | Wt 174.0 lb

## 2013-03-31 DIAGNOSIS — G9050 Complex regional pain syndrome I, unspecified: Secondary | ICD-10-CM

## 2013-03-31 MED ORDER — NORTRIPTYLINE HCL 25 MG PO CAPS: 25.0000 mg | ORAL_CAPSULE | Freq: Every day | ORAL | Status: DC

## 2013-03-31 NOTE — Assessment & Plan Note (Addendum)
Concern for CRPS Type 1 as patient has had worsening radiculopathy and paresthesia/allodynia over the last several months (Since October) Do not see skin changes, nail changes, erythema but does have increased edema RUE distally > LUE.  Will try on Pamelor 25 mg qhs and see back in two weeks.  Can titrate up by 25 mg q week, but will defer to q every other week pending clinical improvement.  May need to go to PT/OT for ADL and ROM exercises.  Lastly, consider topical capsicin cream vs sending for nerve block from Pain Management.   Information given on CRPS along with Pamelor.  Very low risk of Serotonin syndrome, but S/Sx discussed with patient and told to call/go to ED if any of these occur

## 2013-03-31 NOTE — Telephone Encounter (Signed)
Pt is asking to be seen about pain in shoulder - she wants a shot to help with it.

## 2013-03-31 NOTE — Telephone Encounter (Signed)
Patient c/o shoulder pain.  Has seen Dr. Armen Pickup and Dr. Cristal Ford for this recently.  Patient states Dr. Cristal Ford mentioned possible "shot" for shoulder pain.  Patient requesting appt today to evaluate shoulder pain/  Appt scheduled on overflow clinic for today at 2:00 pm.  Patient informed that this is a work-in appt and may have to wait to be seen by MD.  Patient verbalized understanding.  Gaylene Brooks, RN

## 2013-03-31 NOTE — Patient Instructions (Signed)
Maria Griffith, you most likely have some chronic pain stemming from your nerves in your neck.  We will try you on a medication called nortriptyline that you should take at night because it may make your drowsy.  We will see you back in two weeks to see if you have had an improvement.  If so, we can consider going up on this medication.  Other things that may be of benefit are going to a physical therapist, trying some skin calming pain medications, and getting enough sleep/not smoking.     Thanks, Dr. Paulina Fusi

## 2013-03-31 NOTE — Telephone Encounter (Signed)
Message copied by Tanna Savoy on Mon Mar 31, 2013 11:57 AM ------      Message from: Durwin Reges      Created: Fri Mar 28, 2013  2:23 PM       White team, please inform pt I sent rx for soft cervical collar to wear for neck pain and protection. ------

## 2013-03-31 NOTE — Telephone Encounter (Signed)
Letter completed and put at front desk for patient to pick up, and patient called about this by front staff at around 9AM.
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
## 2013-03-31 NOTE — Telephone Encounter (Signed)
Related message,pt voiced understanding. Makoa Satz S  

## 2013-04-07 ENCOUNTER — Telehealth: Payer: Self-pay | Admitting: Family Medicine

## 2013-04-07 NOTE — Telephone Encounter (Signed)
Patients husband sees an Investment banker, operational and saw them today.  They do take the [facility] and said they would take her.  They just need a Referral.  [facility], phone # [phone] and fax # [phone], Attention: Marchelle Folks.

## 2013-04-07 NOTE — Telephone Encounter (Signed)
The patient wants to see Dr. Lajoyce Corners at this practice.

## 2013-04-07 NOTE — Telephone Encounter (Signed)
Will forward to Lupita Leash as she will fax this referral in May.  Will ask her to document on form that pt prefers [place].  Deaven Urwin, [name]

## 2013-04-07 NOTE — Telephone Encounter (Signed)
Patient called asking for an update regarding this referral.   Shared the information contained in this phone note.  Both patient and her husband appreciative.

## 2013-04-14 ENCOUNTER — Other Ambulatory Visit: Payer: Self-pay | Admitting: Family Medicine

## 2013-04-14 DIAGNOSIS — M5010 Cervical disc disorder with radiculopathy, unspecified cervical region: Secondary | ICD-10-CM

## 2013-04-15 ENCOUNTER — Telehealth: Payer: Self-pay | Admitting: Family Medicine

## 2013-04-15 NOTE — Telephone Encounter (Signed)
Pt is asking to speak to nurse about her referrals

## 2013-04-16 ENCOUNTER — Ambulatory Visit: Payer: PRIVATE HEALTH INSURANCE | Attending: Family Medicine

## 2013-04-16 DIAGNOSIS — R262 Difficulty in walking, not elsewhere classified: Secondary | ICD-10-CM | POA: Insufficient documentation

## 2013-04-16 DIAGNOSIS — R5381 Other malaise: Secondary | ICD-10-CM | POA: Insufficient documentation

## 2013-04-16 DIAGNOSIS — IMO0001 Reserved for inherently not codable concepts without codable children: Secondary | ICD-10-CM | POA: Insufficient documentation

## 2013-04-16 DIAGNOSIS — M6281 Muscle weakness (generalized): Secondary | ICD-10-CM | POA: Insufficient documentation

## 2013-04-16 NOTE — Telephone Encounter (Signed)
LMOVM for pt to return call.  Please find out addition details if possible. Nikkia Devoss, [name]

## 2013-04-23 ENCOUNTER — Ambulatory Visit: Payer: PRIVATE HEALTH INSURANCE | Admitting: Family Medicine

## 2013-04-25 ENCOUNTER — Ambulatory Visit: Payer: PRIVATE HEALTH INSURANCE | Admitting: Physical Therapy

## 2013-04-29 ENCOUNTER — Ambulatory Visit (INDEPENDENT_AMBULATORY_CARE_PROVIDER_SITE_OTHER): Payer: PRIVATE HEALTH INSURANCE | Admitting: Family Medicine

## 2013-04-29 ENCOUNTER — Encounter: Payer: Self-pay | Admitting: Family Medicine

## 2013-04-29 VITALS — BP 110/76 | HR 88 | Temp 97.8°F | Ht 64.0 in | Wt 167.0 lb

## 2013-04-29 DIAGNOSIS — R51 Headache: Secondary | ICD-10-CM

## 2013-04-29 DIAGNOSIS — R259 Unspecified abnormal involuntary movements: Secondary | ICD-10-CM

## 2013-04-29 DIAGNOSIS — R251 Tremor, unspecified: Secondary | ICD-10-CM | POA: Insufficient documentation

## 2013-04-29 DIAGNOSIS — J45909 Unspecified asthma, uncomplicated: Secondary | ICD-10-CM

## 2013-04-29 DIAGNOSIS — N393 Stress incontinence (female) (male): Secondary | ICD-10-CM

## 2013-04-29 DIAGNOSIS — J4540 Moderate persistent asthma, uncomplicated: Secondary | ICD-10-CM

## 2013-04-29 DIAGNOSIS — M25519 Pain in unspecified shoulder: Secondary | ICD-10-CM

## 2013-04-29 DIAGNOSIS — R29898 Other symptoms and signs involving the musculoskeletal system: Secondary | ICD-10-CM

## 2013-04-29 DIAGNOSIS — M25511 Pain in right shoulder: Secondary | ICD-10-CM

## 2013-04-29 DIAGNOSIS — I10 Essential (primary) hypertension: Secondary | ICD-10-CM

## 2013-04-29 MED ORDER — TRAMADOL HCL 50 MG PO TABS: 50.0000 mg | ORAL_TABLET | Freq: Four times a day (QID) | ORAL | Status: DC | PRN

## 2013-04-29 NOTE — Assessment & Plan Note (Signed)
    Asthma:Stable on current treatment regimen.  Continue Albuterol prn as well as Flovent for Asthma,she seem to be doing well on this.

## 2013-04-29 NOTE — Assessment & Plan Note (Signed)
  Chronic Migraine:   I discussed imitrex,but due to her Trazodone,med not prescribed,may continue Topamax and gabapentin for now pending neurology evaluation,awaiting review of her MRI brain. I checked on Epic and did not find report,I also called [facility] radiology department to confirm if test was done,the tech did not find the MRI report either,patient might be talking about MRI neck instead although it was also documented by her previous PMD here that she had it done. I will f/u with this.

## 2013-04-29 NOTE — Assessment & Plan Note (Signed)
   Arm weakness:Etiology unclear   She mentioned she had MRI brain,I will try to obtain record. I also suggested she mention this to neurologist at next appointment,might benefit from [facility] not done by neurologist I will order test.

## 2013-04-29 NOTE — Assessment & Plan Note (Signed)
   Tremors: likely medication induced.    Potential medication causing her tremors includes Gabapentin,Zoloft,Trazodone,unsure which one exactly,I am seeing her for the first time today and will not want to tamper with her med,if tremor worsens will take her off gabapentin first and work on the other meds gradually.

## 2013-04-29 NOTE — Assessment & Plan Note (Signed)
  Urine Incontinence:  Kegel exercise instruction given.

## 2013-04-29 NOTE — Assessment & Plan Note (Signed)
   Shoulder pain:    Tramadol prescribed prn pain. She completed PT session already.If no improvement might need to restart PT.
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
## 2013-04-29 NOTE — Patient Instructions (Signed)
Urinary Incontinence Your doctor wants you to have this information about urinary incontinence. This is the inability to keep urine in your body until you decide to release it. CAUSES  Prostate gland enlargement is a common cause of urinary incontinence. But there are many different causes for losing urinary control. They include:  Medicines.  Infections.  Prostate problems.  Surgery.  Neurological diseases.  Emotional factors. DIAGNOSIS  Evaluating the cause of incontinence is important in choosing the best treatment. This may require:  An ultrasound exam.  Kidney and bladder X-rays.  Cystoscopy. This is an exam of the bladder using a narrow scope. TREATMENT  For incontinent patients, normal daily hygiene and using changing pads or adult diapers regularly will prevent offensive odors and skin damage from the moisture. Changing your medicines may help control incontinence. Your caregiver may prescribe some medicines to help you regain control. Avoid caffeine. It can over-stimulate the bladder. Use the bathroom regularly. Try about every 2 to 3 hours even if you do not feel the need. Take time to empty your bladder completely. After urinating, wait a minute. Then try to urinate again. External devices used to catch urine or an indwelling urine catheter (Foley catheter) may be needed as well. Some prostate gland problems require surgery to correct. Call your caregiver for more information. Document Released: 01/11/2005 Document Revised: 02/26/2012 Document Reviewed: 01/06/2009 [employer] Patient Information 2013 [employer].  

## 2013-04-29 NOTE — Assessment & Plan Note (Signed)
HTN:  BP well controlled on med,continue current regimen.

## 2013-04-30 ENCOUNTER — Telehealth: Payer: Self-pay | Admitting: Family Medicine

## 2013-04-30 ENCOUNTER — Encounter: Payer: Self-pay | Admitting: Family Medicine

## 2013-04-30 MED ORDER — TRAMADOL HCL 50 MG PO TABS: 50.0000 mg | ORAL_TABLET | Freq: Four times a day (QID) | ORAL | Status: DC | PRN

## 2013-04-30 NOTE — Addendum Note (Signed)
Addended by: Jone Baseman D on: 04/30/2013 01:57 PM   Modules accepted: Orders

## 2013-04-30 NOTE — Telephone Encounter (Signed)
Pt states that Maria Griffith is accepting the orange card and is needing this referral done asap  Also states the [facility].  pls advise

## 2013-04-30 NOTE — Telephone Encounter (Signed)
Patient was informed of orange card protocol ,that it could take up to 6 months and that tremodol was called in on the 13th of may.she voiced understanding. Tayquan Gassman, Virgel Bouquet

## 2013-04-30 NOTE — Telephone Encounter (Signed)
To MD. Fleeger, Jessica Dawn  

## 2013-04-30 NOTE — Telephone Encounter (Signed)
I spoke with patient for almost 20 min,She does not have any contraindication to Tramadol that am aware off but Imitrex was not prescribed for her Migraine due to Trazodone and Zoloft,I explain this to her. Also discuss possibility of her tremors due to Zoloft,Gabapentin and Trazodone.She stated Tremors started before her gabapentin,she will like to stop other med apart from Gabapentin. I suggested she cut back on Zoloft to 25mg instead of 50 mg and f/u soon with her Psych at [facility] for further management. Also I informed her that I do not see the MRI brain she had done but MRI cervical spine. She has F/U with Neuro in few weeks. 

## 2013-04-30 NOTE — Telephone Encounter (Signed)
Called again and said that they got a message stating that she cannot take pain med (tramadol) because it interferes with Topamax - needs to know what she can take for her pain

## 2013-04-30 NOTE — Telephone Encounter (Signed)
I spoke with patient for almost 20 min,She does not have any contraindication to Tramadol that am aware off but Imitrex was not prescribed for her Migraine due to Trazodone and Zoloft,I explain this to her. Also discuss possibility of her tremors due to Zoloft,Gabapentin and Trazodone.She stated Tremors started before her gabapentin,she will like to stop other med apart from Gabapentin. I suggested she cut back on Zoloft to 25mg  instead of 50 mg and f/u soon with her Psych at [facility] for further management. Also I informed her that I do not see the MRI brain she had done but MRI cervical spine. She has F/U with Neuro in few weeks.

## 2013-05-02 ENCOUNTER — Ambulatory Visit: Payer: PRIVATE HEALTH INSURANCE

## 2013-05-09 ENCOUNTER — Encounter: Payer: PRIVATE HEALTH INSURANCE | Admitting: Physical Therapy

## 2013-05-13 ENCOUNTER — Telehealth: Payer: Self-pay | Admitting: *Deleted

## 2013-05-13 ENCOUNTER — Telehealth: Payer: Self-pay | Admitting: Family Medicine

## 2013-05-13 ENCOUNTER — Encounter: Payer: Self-pay | Admitting: *Deleted

## 2013-05-13 NOTE — Telephone Encounter (Signed)
We can get her to sport medicine clinic if she wants,please check with her.

## 2013-05-13 NOTE — Telephone Encounter (Signed)
Pt would like to know status of ortho referral - " I am in a lot of pain and my neck and shoulder hurt"   AVS mailed with female urinary incontinence info attached.  Please review pt meds and advise as to what she should be currently taking. Pt states that neurologist is telling her that she should be taking/not taking meds and that they will interfere with meds they would like to prescribe for headaches. Please call pt at your earliest convince. Wyatt Haste, RN-BSN

## 2013-05-13 NOTE — Telephone Encounter (Signed)
I see an orthopedic appointment made for 05/16/13 please confirm appointment with ortho and let patient know the date and time.

## 2013-05-13 NOTE — Telephone Encounter (Signed)
Informed pt that it could take up to 6 months to get ortho referral because it must go through [facility] ( have spoken to Lupita Leash and pt has spoken to multiple CMAs regarding appointment) - advised to use moist heat and Aspercream for pain relief. Pt verbalized understanding. Wyatt Haste, RN-BSN

## 2013-05-13 NOTE — Telephone Encounter (Signed)
I reviewed her chart,a referral order was put in by Dr Ronald Lobo check to see this appointment was made.

## 2013-05-13 NOTE — Telephone Encounter (Signed)
The patient needs to speak to the nurse about what medication she is supposed to be taking and what she isn't.  Also, she needs to know that the status of the referral for orthopaedic surgery is.  She would also like a copy of the AVS from her appointment on 5/13, mailed to her home.

## 2013-05-16 ENCOUNTER — Ambulatory Visit: Payer: PRIVATE HEALTH INSURANCE | Attending: Family Medicine | Admitting: Physical Therapy

## 2013-05-16 DIAGNOSIS — M25519 Pain in unspecified shoulder: Secondary | ICD-10-CM | POA: Insufficient documentation

## 2013-05-16 DIAGNOSIS — R51 Headache: Secondary | ICD-10-CM | POA: Insufficient documentation

## 2013-05-16 DIAGNOSIS — R5381 Other malaise: Secondary | ICD-10-CM | POA: Insufficient documentation

## 2013-05-16 DIAGNOSIS — M542 Cervicalgia: Secondary | ICD-10-CM | POA: Insufficient documentation

## 2013-05-16 DIAGNOSIS — IMO0001 Reserved for inherently not codable concepts without codable children: Secondary | ICD-10-CM | POA: Insufficient documentation

## 2013-05-19 ENCOUNTER — Telehealth: Payer: Self-pay | Admitting: Family Medicine

## 2013-05-19 NOTE — Telephone Encounter (Signed)
Pt states dr Algie Coffer cant take tramadol for her pain. She is still in pain and needs something. She has been to physical therapy at [facility]. Please advise

## 2013-05-19 NOTE — Telephone Encounter (Signed)
Spoke with patient and explained that the Tramadol was ok for her to take and instructed patient to take for her back pain.  Had to explain several times to discuss the Trazadone and Imitrex with her psychiatrist.  Encouraged patient to bring ALL meds to psychiatrist appt.  Instructed patient to bring all meds to her f/u appt with Dr. Lum Babe 06/03/2013.  Patient had lots of other questions regarding PT and orthopedist, her face rash, her medications, none of any urgent matter, all of which I instructed her need to be discussed at an office visit with her doctor, not over the phone. Pt again told to take Tramadol for her back pain.  Instructed patient if her back pain gets worse to schedule a sooner OV for back pain only, but to keep OV with Dr. Lum Babe for all other questions. Pt verbalized understanding.  Blanton Kardell, Darlyne Russian, CMA

## 2013-05-19 NOTE — Telephone Encounter (Signed)
Spoke with patient.  Was given Tramadol at OV but then told not to take by MD due to other medications.  Her back is really bothering her and needs something for pain.  Has OV with Dr. Lum Babe on 06/03/2013.  Jennife Zaucha, Darlyne Russian, CMA

## 2013-05-19 NOTE — Telephone Encounter (Signed)
Please check who had told her not to use Tramadol,if you go through previous note and previous telephone encounter patient had asked similar question and I explained to her it is Trazodone that interact with Imitrex not Tramadol of which I did not even prescribe Imitrex to her, I suggested she discuss Trazodone with her Psychiatrist. Let her know if no prior allergy to Tramadol she can take it.

## 2013-05-21 ENCOUNTER — Ambulatory Visit: Payer: PRIVATE HEALTH INSURANCE | Attending: Family Medicine | Admitting: Physical Therapy

## 2013-05-21 DIAGNOSIS — IMO0001 Reserved for inherently not codable concepts without codable children: Secondary | ICD-10-CM | POA: Insufficient documentation

## 2013-05-21 DIAGNOSIS — M545 Low back pain, unspecified: Secondary | ICD-10-CM | POA: Insufficient documentation

## 2013-05-21 DIAGNOSIS — M25559 Pain in unspecified hip: Secondary | ICD-10-CM | POA: Insufficient documentation

## 2013-05-28 ENCOUNTER — Ambulatory Visit: Payer: PRIVATE HEALTH INSURANCE | Admitting: Physical Therapy

## 2013-06-03 ENCOUNTER — Ambulatory Visit (INDEPENDENT_AMBULATORY_CARE_PROVIDER_SITE_OTHER): Payer: PRIVATE HEALTH INSURANCE | Admitting: Family Medicine

## 2013-06-03 ENCOUNTER — Encounter: Payer: Self-pay | Admitting: Family Medicine

## 2013-06-03 VITALS — BP 121/82 | HR 85 | Temp 98.0°F | Wt 162.0 lb

## 2013-06-03 DIAGNOSIS — N393 Stress incontinence (female) (male): Secondary | ICD-10-CM

## 2013-06-03 DIAGNOSIS — M25519 Pain in unspecified shoulder: Secondary | ICD-10-CM

## 2013-06-03 DIAGNOSIS — T7411XA Adult physical abuse, confirmed, initial encounter: Secondary | ICD-10-CM

## 2013-06-03 DIAGNOSIS — M4712 Other spondylosis with myelopathy, cervical region: Secondary | ICD-10-CM

## 2013-06-03 DIAGNOSIS — R51 Headache: Secondary | ICD-10-CM

## 2013-06-03 DIAGNOSIS — T148XXA Other injury of unspecified body region, initial encounter: Secondary | ICD-10-CM | POA: Insufficient documentation

## 2013-06-03 DIAGNOSIS — M4722 Other spondylosis with radiculopathy, cervical region: Secondary | ICD-10-CM

## 2013-06-03 DIAGNOSIS — M25511 Pain in right shoulder: Secondary | ICD-10-CM

## 2013-06-03 HISTORY — DX: Adult physical abuse, confirmed, initial encounter: T74.11XA

## 2013-06-03 NOTE — Assessment & Plan Note (Signed)
Pain seem to be radiating from the neck. I reviewed her MRI C-spine report which was significant for spinal stenosis of her C4-C5,this could potentially cause nerve compression syndrome. Patient already referred to PT scheduled to be seen June 20,patient aware of appointment. Orthopedic appointment not yet scheduled,(she is on waiting list). Continue Tramadol prn in the interim,I will also refer to sport medicine clinic for second opinion.

## 2013-06-03 NOTE — Assessment & Plan Note (Signed)
I reviewed her Neurology report from May,as per their documentation all her MRI report done at [facility] was reviewed on a CD,they recommended she d/c Topamax probably because she is not improving on medication. Continue Gabapentin. Nortriptyline recommended  By her neurologist since it will also help with her depression. Agree with discussing medication with her Psychiatrist since she is on other antidepressant as well. I considered headache clinic referral but this will not be covered by her orange card. I reviewed her MRI from 2013 with suspected tiny Lacunar infarct this could also be due to her headache and vice versa. No change to her medication today,also may use Tramadol prn headache.
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
## 2013-06-03 NOTE — Assessment & Plan Note (Signed)
Kegel exercise does not seem to be helping at this time. I will try to delay starting Oxybutynin due to other medications she is on,she is already on lots of medication. At some point I will consider urology referral.
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
## 2013-06-04 ENCOUNTER — Encounter: Payer: Self-pay | Admitting: Family Medicine

## 2013-06-06 ENCOUNTER — Ambulatory Visit: Payer: PRIVATE HEALTH INSURANCE | Admitting: Physical Therapy

## 2013-06-12 ENCOUNTER — Ambulatory Visit: Payer: Self-pay | Admitting: Family Medicine

## 2013-06-27 ENCOUNTER — Ambulatory Visit: Payer: PRIVATE HEALTH INSURANCE | Attending: Family Medicine | Admitting: Physical Therapy

## 2013-06-27 DIAGNOSIS — M6281 Muscle weakness (generalized): Secondary | ICD-10-CM | POA: Insufficient documentation

## 2013-06-27 DIAGNOSIS — R5381 Other malaise: Secondary | ICD-10-CM | POA: Insufficient documentation

## 2013-06-27 DIAGNOSIS — IMO0001 Reserved for inherently not codable concepts without codable children: Secondary | ICD-10-CM | POA: Insufficient documentation

## 2013-06-27 DIAGNOSIS — R262 Difficulty in walking, not elsewhere classified: Secondary | ICD-10-CM | POA: Insufficient documentation

## 2013-07-07 ENCOUNTER — Ambulatory Visit (INDEPENDENT_AMBULATORY_CARE_PROVIDER_SITE_OTHER): Payer: PRIVATE HEALTH INSURANCE | Admitting: Sports Medicine

## 2013-07-07 VITALS — BP 127/82 | Ht 62.0 in | Wt 160.0 lb

## 2013-07-07 DIAGNOSIS — M5010 Cervical disc disorder with radiculopathy, unspecified cervical region: Secondary | ICD-10-CM

## 2013-07-07 DIAGNOSIS — M5412 Radiculopathy, cervical region: Secondary | ICD-10-CM

## 2013-07-07 MED ORDER — METHYLPREDNISOLONE ACETATE 80 MG/ML IJ SUSP
80.0000 mg | Freq: Once | INTRAMUSCULAR | Status: AC
  Administered 2013-07-07: 80 mg via INTRAMUSCULAR
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
## 2013-07-09 ENCOUNTER — Telehealth: Payer: Self-pay | Admitting: *Deleted

## 2013-07-09 NOTE — Telephone Encounter (Signed)
Per [facility]- nurse will review referral and they will call pt to schedule.

## 2013-07-09 NOTE — Telephone Encounter (Signed)
Per Dr. Margaretha Sheffield- referred pt to [facility] neurosurgery.  Faxed referral to [phone] [facility] [place].

## 2013-07-18 ENCOUNTER — Ambulatory Visit (HOSPITAL_COMMUNITY)
Admission: RE | Admit: 2013-07-18 | Discharge: 2013-07-18 | Disposition: A | Discharge status: Home / Self Care | Payer: PRIVATE HEALTH INSURANCE | Source: Ambulatory Visit | Attending: Family Medicine | Admitting: Family Medicine

## 2013-07-18 ENCOUNTER — Ambulatory Visit (INDEPENDENT_AMBULATORY_CARE_PROVIDER_SITE_OTHER): Payer: PRIVATE HEALTH INSURANCE | Admitting: Family Medicine

## 2013-07-18 ENCOUNTER — Ambulatory Visit: Payer: PRIVATE HEALTH INSURANCE | Admitting: Family Medicine

## 2013-07-18 ENCOUNTER — Encounter: Payer: Self-pay | Admitting: Family Medicine

## 2013-07-18 ENCOUNTER — Ambulatory Visit: Payer: PRIVATE HEALTH INSURANCE

## 2013-07-18 VITALS — BP 145/93 | HR 88 | Temp 98.2°F | Wt 156.0 lb

## 2013-07-18 DIAGNOSIS — N926 Irregular menstruation, unspecified: Secondary | ICD-10-CM | POA: Insufficient documentation

## 2013-07-18 DIAGNOSIS — N939 Abnormal uterine and vaginal bleeding, unspecified: Secondary | ICD-10-CM

## 2013-07-18 DIAGNOSIS — R05 Cough: Secondary | ICD-10-CM

## 2013-07-18 DIAGNOSIS — D259 Leiomyoma of uterus, unspecified: Secondary | ICD-10-CM | POA: Insufficient documentation

## 2013-07-18 LAB — POCT URINALYSIS DIPSTICK
Ketones, UA: 15
Nitrite, UA: NEGATIVE
Protein, UA: 30
pH, UA: 6

## 2013-07-18 LAB — POCT UA - MICROSCOPIC ONLY

## 2013-07-18 LAB — POCT URINE PREGNANCY: Preg Test, Ur: NEGATIVE

## 2013-07-18 MED ORDER — NORGESTIMATE-ETH ESTRADIOL 0.25-35 MG-MCG PO TABS: 3.0000 | ORAL_TABLET | Freq: Every day | ORAL | Status: DC

## 2013-07-18 MED ORDER — NORGESTIMATE-ETH ESTRADIOL 0.25-35 MG-MCG PO TABS: 1.0000 | ORAL_TABLET | Freq: Every day | ORAL | Status: DC

## 2013-07-18 MED ORDER — TRAMADOL HCL 50 MG PO TABS: 100.0000 mg | ORAL_TABLET | Freq: Every evening | ORAL | Status: DC | PRN

## 2013-07-18 NOTE — Patient Instructions (Addendum)
I am sorry you are not feeling well.  For your cough, please take plain Mucinex or Robitussin. Avoid the decongestants since they may increase your blood pressure. No need for an antibiotic at this time.  For your bleeding, I have prescribed one pack of birth control pills. Take 3 tabs per day for one week. This should STOP the bleeding. Also, we are getting an ultrasound to look at your uterus and tubes. This is to make sure you do not have a new fibroid or anything serious that could cause your pain and bleeding.  I will call you with these results.  Lillee Mooneyhan M. Urie Loughner, M.D.

## 2013-07-18 NOTE — Assessment & Plan Note (Addendum)
History of fibroids. Could be contributing to pain and bleeding. Will give OCP (3 pills daily x7 days) to stop bleeding, and get pelvic ultrasound to evaluate for fibroids. Tramadol prn pain.  UA done due to lower abdominal pain which shows: Results for orders placed in visit on 07/18/13 (from the past 24 hour(s))  POCT URINE PREGNANCY     Status: None   Collection Time    07/18/13 11:36 AM      Result Value Range   Preg Test, Ur Negative    POCT URINALYSIS DIPSTICK     Status: Abnormal   Collection Time    07/18/13 11:36 AM      Result Value Range   Color, UA YELLOW     Clarity, UA SLIGHTLY CLOUDY     Glucose, UA NEG     Bilirubin, UA SMALL     Ketones, UA 15     Spec Grav, UA >=1.030     Blood, UA MODERATE     pH, UA 6.0     Protein, UA 30     Urobilinogen, UA 0.2     Nitrite, UA NEG     Leukocytes, UA moderate (2+)     Narrative:    Reflex to microscopic   POCT UA - MICROSCOPIC ONLY     Status: Abnormal   Collection Time    07/18/13 11:36 AM      Result Value Range   WBC, Ur, HPF, POC >20     RBC, urine, microscopic 1-5     Bacteria, U Microscopic 2+     Mucus, UA 2+     Epithelial cells, urine per micros 10-20     Narrative:    Urine sent for culture   I did not treat as UTI given the amount of epithelial cells. Will send for culture, and treat if positive. If pain does not resolve, will need to RTC and have pelvic exam.  ADDENDUM: U/S shows large fibroid (around 5cm) so at this point I recommend she been seen by [facility] for further evaluation and management. Patient agrees with plan and referral sent.

## 2013-07-18 NOTE — Assessment & Plan Note (Signed)
No signs of infectious cause of cough other than URI. Will treat conservatively with OTC Robitussin or Mucinex. Advised to not take decongestants due to high blood pressure.

## 2013-07-18 NOTE — Addendum Note (Signed)
Addended by: Hilarie Fredrickson on: 07/18/2013 02:40 PM   Modules accepted: Orders

## 2013-07-18 NOTE — Progress Notes (Signed)
Patient ID: Maria Griffith, female   DOB: December 20, 1960, 52 y.o.   MRN: [id number]  [facility] Darthula Desa M. Dexton Zwilling, MD Phone: [phone]   Subjective: HPI: Patient is a 52 y.o. female presenting to clinic today for same day appointment for menstrual bleeding.  Irregular Menstruation Patient complains of irregular menses. Patient's last menstrual period was 06/27/2013. Still bleeding. Menarche age: 51. Periods were regular until she had a fibroid surgery (in [place]) about 6 years ago. Periods have been irregular since then. Before this current period, her last period was in June and lasted one week. Dysmenorrhea:moderate, occurring first 1-2 days of flow and throughout cycle. Cyclic symptoms include: pelvic pain. Current contraception: tubal ligation. History of abnormal Pap smear: no. Uses 4 pads per day. Taking Midol for pain.   Cough Patient complains of nasal congestion and nonproductive cough. Symptoms began 2 weeks ago. Symptoms have been unchanged since that time.The cough is hoarse, nocturnal and nonproductive and is aggravated by nothing. Associated symptoms include: Nothing.. Patient does have history of asthma.   History Reviewed: Non smoker.  ROS: Please see HPI above.  Objective: Office vital signs reviewed. BP 145/93  Pulse 88  Temp(Src) 98.2 F (36.8 C) (Oral)  Wt 156 lb (70.761 kg)  BMI 28.53 kg/m2  LMP 06/27/2013  Physical Examination:  General: Awake, alert. Appears uncomfortable. Sitting in wheelchair with multiple pillows around her, bending forward. Husband at bedside HEENT: Atraumatic, normocephalic. MMM  Neck: No masses palpated. No LAD Pulm: CTAB, no wheezes Cardio: RRR, ?soft SEM Abdomen:+BS, soft, nondistended. TTP suprapubic and LLQ/RLQ. Unable to really assess for rebound tenderness Extremities: No edema Neuro: Grossly intact  Assessment: 52 y.o. female with menorrhagia and cough  Plan: See Problem List and After Visit  Summary

## 2013-07-20 LAB — URINE CULTURE: Colony Count: 7000

## 2013-07-21 ENCOUNTER — Telehealth: Payer: Self-pay | Admitting: Family Medicine

## 2013-07-21 MED ORDER — ONDANSETRON HCL 4 MG PO TABS: 4.0000 mg | ORAL_TABLET | Freq: Three times a day (TID) | ORAL | Status: DC | PRN

## 2013-07-21 NOTE — Telephone Encounter (Signed)
I spoke with patient about U/S report, she is aware and has been referred already to OB/GYN. She stated her bleeding stopped yesterday with occasional dark brown spotting. The birth control she was prescribed make her nauseous,she threw up once hence will need something for N/V. I agree to prescribe Zofran. I advised her not to take medication with hydroxyzine due to sedative effect. She verbalized understanding.

## 2013-07-25 ENCOUNTER — Other Ambulatory Visit: Payer: Self-pay | Admitting: Family Medicine

## 2013-07-25 MED ORDER — QUINAPRIL-HYDROCHLOROTHIAZIDE 20-12.5 MG PO TABS: 1.0000 | ORAL_TABLET | Freq: Every day | ORAL | Status: DC

## 2013-07-25 NOTE — Telephone Encounter (Signed)
Received MAP program refill request for accuretic for 90 tabs. Signed off on refill of 90 tabs with 0 refills as it looked like PCP continued this at May visit.  I used to be PCP which is why I think this was sent to me. Copying PCP on this note.   Leona Singleton, MD

## 2013-07-28 ENCOUNTER — Other Ambulatory Visit: Payer: Self-pay | Admitting: Family Medicine

## 2013-07-28 ENCOUNTER — Encounter: Payer: Self-pay | Admitting: Obstetrics & Gynecology

## 2013-07-28 MED ORDER — FLUTICASONE PROPIONATE HFA 220 MCG/ACT IN AERO: 1.0000 | INHALATION_SPRAY | Freq: Two times a day (BID) | RESPIRATORY_TRACT | Status: DC

## 2013-07-28 NOTE — Telephone Encounter (Signed)
Flovent refilled via fax.

## 2013-08-06 ENCOUNTER — Other Ambulatory Visit: Payer: Self-pay | Admitting: Emergency Medicine

## 2013-08-26 ENCOUNTER — Telehealth: Payer: Self-pay | Admitting: Family Medicine

## 2013-08-26 NOTE — Telephone Encounter (Signed)
Will forward to MD. Jazmin Hartsell,CMA  

## 2013-08-26 NOTE — Telephone Encounter (Signed)
Patient calls stating she wants Dr. Lum Babe to know she is having surgery on her spine (Anterior cervical decompression fusion)  within the next 3 weeks, and did not know if Dr. Lum Babe wanted to see her first. Would like to speak to Dr. Lum Babe if possible.

## 2013-08-27 NOTE — Telephone Encounter (Signed)
Have patient check with surgeon if she need medical clearance,if yes then I need to see her prior unless she has already been cleared medically.

## 2013-08-28 ENCOUNTER — Telehealth: Payer: Self-pay | Admitting: Family Medicine

## 2013-08-28 NOTE — Telephone Encounter (Signed)
Will forward to MD. Jazmin Hartsell,CMA  

## 2013-08-28 NOTE — Telephone Encounter (Signed)
Pt wants to talk to dr before she has surgery on her shoulder to update her on everything that is going on. She wants to know if she should  see the dr before she had the surgery. Please advise

## 2013-08-29 ENCOUNTER — Telehealth: Payer: Self-pay | Admitting: Family Medicine

## 2013-08-29 NOTE — Telephone Encounter (Signed)
LMOVM for pt to return call. Please inform when she calls back that she will need an appt. Fleeger, [name]

## 2013-08-29 NOTE — Telephone Encounter (Signed)
Please have patient schedule pre-surgery assessment with me. I do not know what surgery she is having or when she is having it.

## 2013-09-04 ENCOUNTER — Encounter: Payer: Self-pay | Admitting: *Deleted

## 2013-09-04 ENCOUNTER — Other Ambulatory Visit (HOSPITAL_COMMUNITY)
Admission: RE | Admit: 2013-09-04 | Discharge: 2013-09-04 | Disposition: A | Discharge status: Home / Self Care | Payer: No Typology Code available for payment source | Source: Ambulatory Visit | Attending: Obstetrics & Gynecology | Admitting: Obstetrics & Gynecology

## 2013-09-04 ENCOUNTER — Encounter: Payer: Self-pay | Admitting: Obstetrics & Gynecology

## 2013-09-04 ENCOUNTER — Ambulatory Visit (INDEPENDENT_AMBULATORY_CARE_PROVIDER_SITE_OTHER): Payer: Self-pay | Admitting: Obstetrics & Gynecology

## 2013-09-04 VITALS — BP 114/83 | HR 77 | Temp 98.3°F | Ht 62.0 in | Wt 154.3 lb

## 2013-09-04 DIAGNOSIS — N920 Excessive and frequent menstruation with regular cycle: Secondary | ICD-10-CM | POA: Insufficient documentation

## 2013-09-04 DIAGNOSIS — R32 Unspecified urinary incontinence: Secondary | ICD-10-CM

## 2013-09-04 DIAGNOSIS — Z01812 Encounter for preprocedural laboratory examination: Secondary | ICD-10-CM

## 2013-09-04 MED ORDER — OXYBUTYNIN CHLORIDE ER 5 MG PO TB24: 5.0000 mg | ORAL_TABLET | Freq: Every day | ORAL | Status: DC

## 2013-09-04 MED ORDER — NORETHIN ACE-ETH ESTRAD-FE 1-20 MG-MCG(24) PO TABS: 1.0000 | ORAL_TABLET | Freq: Every day | ORAL | Status: DC
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
## 2013-09-04 NOTE — Patient Instructions (Signed)
Urinary Incontinence Your doctor wants you to have this information about urinary incontinence. This is the inability to keep urine in your body until you decide to release it. CAUSES  Prostate gland enlargement is a common cause of urinary incontinence. But there are many different causes for losing urinary control. They include:  Medicines.  Infections.  Prostate problems.  Surgery.  Neurological diseases.  Emotional factors. DIAGNOSIS  Evaluating the cause of incontinence is important in choosing the best treatment. This may require:  An ultrasound exam.  Kidney and bladder X-rays.  Cystoscopy. This is an exam of the bladder using a narrow scope. TREATMENT  For incontinent patients, normal daily hygiene and using changing pads or adult diapers regularly will prevent offensive odors and skin damage from the moisture. Changing your medicines may help control incontinence. Your caregiver may prescribe some medicines to help you regain control. Avoid caffeine. It can over-stimulate the bladder. Use the bathroom regularly. Try about every 2 to 3 hours even if you do not feel the need. Take time to empty your bladder completely. After urinating, wait a minute. Then try to urinate again. External devices used to catch urine or an indwelling urine catheter (Foley catheter) may be needed as well. Some prostate gland problems require surgery to correct. Call your caregiver for more information. Document Released: 01/11/2005 Document Revised: 02/26/2012 Document Reviewed: 01/06/2009 [facility] Patient Information 2014 [place], [place]. Endometrial Biopsy This is a test in which a tissue sample (a biopsy) is taken from inside the uterus (womb). It is then looked at by a specialist under a microscope to see if the tissue is normal or abnormal. The endometrium is the lining of the uterus. This test helps determine where you are in your menstrual cycle and how hormone levels are affecting the lining  of the uterus. Another use for this test is to diagnose endometrial cancer, tuberculosis, polyps, or inflammatory conditions and to evaluate uterine bleeding. PREPARATION FOR TEST No preparation or fasting is necessary. NORMAL FINDINGS No pathologic conditions. Presence of "secretory-type" endometrium 3 to 5 days before to normal menstruation. Ranges for normal findings may vary among different laboratories and hospitals. You should always check with your doctor after having lab work or other tests done to discuss the meaning of your test results and whether your values are considered within normal limits. MEANING OF TEST  Your caregiver will go over the test results with you and discuss the importance and meaning of your results, as well as treatment options and the need for additional tests if necessary. OBTAINING THE TEST RESULTS It is your responsibility to obtain your test results. Ask the lab or department performing the test when and how you will get your results. Document Released: 04/06/2005 Document Revised: 02/26/2012 Document Reviewed: 11/13/2008 [facility] Patient Information 2014 [place], [place].

## 2013-09-09 ENCOUNTER — Telehealth: Payer: Self-pay | Admitting: *Deleted

## 2013-09-09 NOTE — Telephone Encounter (Signed)
Message copied by Mannie Stabile on Tue Sep 09, 2013 11:26 AM ------      Message from: Willodean Rosenthal      Created: Mon Sep 08, 2013  3:20 PM       Please call pt. Her endobx was negative.  Ask her to f/u as scheduled after her spinal surgery is completed for further eval and tx.            Thx,      clh-s ------

## 2013-09-09 NOTE — Telephone Encounter (Signed)
Called patient and informed her of Dr. Burnice Logan Smith's message.

## 2013-09-12 ENCOUNTER — Telehealth: Payer: Self-pay | Admitting: Family Medicine

## 2013-09-12 NOTE — Telephone Encounter (Signed)
Pt needs refills for amlodipine. Pharmacy: [phone]  [facility] for [place]  Fax: [phone]

## 2013-09-15 ENCOUNTER — Other Ambulatory Visit: Payer: Self-pay | Admitting: Family Medicine

## 2013-09-15 MED ORDER — AMLODIPINE BESYLATE 5 MG PO TABS: 5.0000 mg | ORAL_TABLET | Freq: Every day | ORAL | Status: DC

## 2013-09-15 NOTE — Telephone Encounter (Signed)
Pt is aware.  Maria Griffith,CMA  

## 2013-09-15 NOTE — Telephone Encounter (Signed)
Refill done.  

## 2013-09-16 ENCOUNTER — Ambulatory Visit: Payer: Self-pay

## 2013-09-18 ENCOUNTER — Telehealth: Payer: Self-pay | Admitting: Family Medicine

## 2013-09-18 NOTE — Telephone Encounter (Signed)
Will forward to MD. Shakemia Madera,CMA  
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
## 2013-09-23 ENCOUNTER — Ambulatory Visit: Payer: No Typology Code available for payment source
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
## 2013-09-29 ENCOUNTER — Encounter: Payer: Self-pay | Admitting: Family Medicine

## 2013-09-29 NOTE — Telephone Encounter (Signed)
Patient informed, letter was ready to be picked up. 

## 2013-09-29 NOTE — Telephone Encounter (Signed)
Please inform patient letter is ready for print out and pick up. I forwarded her letter to blue team in box.

## 2013-09-30 ENCOUNTER — Encounter: Payer: Self-pay | Admitting: Family Medicine

## 2013-09-30 ENCOUNTER — Ambulatory Visit (INDEPENDENT_AMBULATORY_CARE_PROVIDER_SITE_OTHER): Payer: Worker's Compensation | Admitting: Family Medicine

## 2013-09-30 VITALS — BP 136/87 | HR 62 | Temp 98.1°F | Wt 154.0 lb

## 2013-09-30 DIAGNOSIS — K649 Unspecified hemorrhoids: Secondary | ICD-10-CM

## 2013-09-30 MED ORDER — HYDROCODONE-ACETAMINOPHEN 5-325 MG PO TABS: 0.5000 | ORAL_TABLET | Freq: Four times a day (QID) | ORAL | Status: DC | PRN

## 2013-09-30 MED ORDER — HYDROCORTISONE ACETATE 25 MG RE SUPP: 25.0000 mg | Freq: Two times a day (BID) | RECTAL | Status: DC

## 2013-09-30 NOTE — Progress Notes (Signed)
Subjective:     Maria Griffith is a 52 y.o. female who presents for evaluation of rectal pain. Onset of symptoms was sudden since yesterday when she was straining to have a bowel movement like she has been since taking vicodin for shoulder/neck pain. Symptoms have been unchanged since that time. Symptoms include: anorectal itching, constipation, pain with sitting and painful defecation. Patient denies melena, receptive anal intercourse and weight loss. Treatment to date has been taking her vicodin only.  Past medical history-complex regional pain syndrome, anemia, major depression, asthma, hypertension  Review of Systems No fever/chills/nausea/vomiting   Objective:    BP 136/87  Pulse 62  Temp(Src) 98.1 F (36.7 C) (Oral)  Wt 154 lb (69.854 kg)  BMI 28.16 kg/m2  LMP 08/31/2013 Rectal: patient with large2.5 x 2 cm external hemorrhoid. patient VERY tender on exam though not so tender on the actual hemorrhoid. No red center to indicate thrombosed hemorrhoid. No signs of rectal tissue to suggest prolapsed rectum.   Gait: antalgic, initially unable to sit   Assessment:    External hemorrhoid   Plan:    Discussed the diagnosis with the patient, including plans for treatment and expected course. Discussed symptomatic and preventative measures regarding hemorrhoidal disease. Recommended fiber supplementation. Anusol suppositories as needed.  Short term use only. Follow up as needed.   Due to size of hemorrhoid, had Dr. Mahala Menghini evaluate as well. Due to level of pain but no obvious thrombosed nature, we wanted to have patient evaluated by surgery but cost was prohibitory. Discussed with Dr. Donell Beers by phone who suggested typical treatments as well as manual replacement of hemorrhoid. This was performed and patient had significant relief of symptoms. Symptomatic treatment-sitz baths, not sitting on toilet paper, anusol suppositories, reduce vicodin to 1/2 tab, take miralax regularly. Patient  will follow up if not improved over next week or if pain worsens or if character of area changes (central red spot and this was discussed with husband.) Husband will also reduce hemorrhoid as able.   >50% of 35 minute office visit was spent on counseling and coordination of care. Patient was very concerned about therapies and she was extensively counseled. Also called Dr. Donell Beers as discussed above.

## 2013-09-30 NOTE — Patient Instructions (Signed)
Remember instructions we discussed.  If no improvement, you will need to follow up with surgery.  Replace hemorrhoid as needed.   Hemorrhoids Hemorrhoids are swollen veins around the rectum or anus. There are two types of hemorrhoids:   Internal hemorrhoids. These occur in the veins just inside the rectum. They may poke through to the outside and become irritated and painful.  External hemorrhoids. These occur in the veins outside the anus and can be felt as a painful swelling or hard lump near the anus. CAUSES  Pregnancy.   Obesity.   Constipation or diarrhea.   Straining to have a bowel movement.   Sitting for long periods on the toilet.  Heavy lifting or other activity that caused you to strain.  Anal intercourse. SYMPTOMS   Pain.   Anal itching or irritation.   Rectal bleeding.   Fecal leakage.   Anal swelling.   One or more lumps around the anus.  DIAGNOSIS  Your caregiver may be able to diagnose hemorrhoids by visual examination. Other examinations or tests that may be performed include:   Examination of the rectal area with a gloved hand (digital rectal exam).   Examination of anal canal using a small tube (scope).   A blood test if you have lost a significant amount of blood.  A test to look inside the colon (sigmoidoscopy or colonoscopy). TREATMENT Most hemorrhoids can be treated at home. However, if symptoms do not seem to be getting better or if you have a lot of rectal bleeding, your caregiver may perform a procedure to help make the hemorrhoids get smaller or remove them completely. Possible treatments include:   Placing a rubber band at the base of the hemorrhoid to cut off the circulation (rubber band ligation).   Injecting a chemical to shrink the hemorrhoid (sclerotherapy).   Using a tool to burn the hemorrhoid (infrared light therapy).   Surgically removing the hemorrhoid (hemorrhoidectomy).   Stapling the hemorrhoid to  block blood flow to the tissue (hemorrhoid stapling).  HOME CARE INSTRUCTIONS   Eat foods with fiber, such as whole grains, beans, nuts, fruits, and vegetables. Ask your doctor about taking products with added fiber in them (fibersupplements).  Increase fluid intake. Drink enough water and fluids to keep your urine clear or pale yellow.   Exercise regularly.   Go to the bathroom when you have the urge to have a bowel movement. Do not wait.   Avoid straining to have bowel movements.   Keep the anal area dry and clean. Use wet toilet paper or moist towelettes after a bowel movement.   Medicated creams and suppositories may be used or applied as directed.   Only take over-the-counter or prescription medicines as directed by your caregiver.   Take warm sitz baths for 15 20 minutes, 3 4 times a day to ease pain and discomfort.   Place ice packs on the hemorrhoids if they are tender and swollen. Using ice packs between sitz baths may be helpful.   Put ice in a plastic bag.   Place a towel between your skin and the bag.   Leave the ice on for 15 20 minutes, 3 4 times a day.   Do not use a donut-shaped pillow or sit on the toilet for long periods. This increases blood pooling and pain.  SEEK MEDICAL CARE IF:  You have increasing pain and swelling that is not controlled by treatment or medicine.  You have uncontrolled bleeding.  You have difficulty or  you are unable to have a bowel movement.  You have pain or inflammation outside the area of the hemorrhoids. MAKE SURE YOU:  Understand these instructions.  Will watch your condition.  Will get help right away if you are not doing well or get worse. Document Released: 12/01/2000 Document Revised: 11/20/2012 Document Reviewed: 10/08/2012 [facility] Patient Information 2014 [place], [place].

## 2013-10-01 ENCOUNTER — Other Ambulatory Visit: Payer: Self-pay | Admitting: Orthopedic Surgery

## 2013-10-03 ENCOUNTER — Telehealth: Payer: Self-pay | Admitting: Family Medicine

## 2013-10-03 ENCOUNTER — Telehealth: Payer: Self-pay | Admitting: *Deleted

## 2013-10-03 MED ORDER — POLYETHYLENE GLYCOL 3350 17 GM/SCOOP PO POWD: 17.0000 g | Freq: Every day | ORAL | Status: DC | PRN

## 2013-10-03 NOTE — Telephone Encounter (Signed)
Added miralax °

## 2013-10-03 NOTE — Telephone Encounter (Signed)
Prior authorization forms for Proctosol HC Cream and Hydrocortisone suppositories prescribed at last office visit placed in MD box.

## 2013-10-03 NOTE — Telephone Encounter (Signed)
Spoke with Pharmacy who stated forms accidentally sent to [place]. Patient only has orange card and does not have prescription insurance so could not need prior authorization form. Pharmacy states they actually were trying to communicate with her employer. Unclear why but I have shredded forms. They would need to be resent if requested again.

## 2013-10-03 NOTE — Telephone Encounter (Signed)
Patient would like Miralax rx called into [facility] [phone]

## 2013-10-06 ENCOUNTER — Telehealth: Payer: Self-pay | Admitting: *Deleted

## 2013-10-06 MED ORDER — NORGESTIMATE-ETH ESTRADIOL 0.25-35 MG-MCG PO TABS: 1.0000 | ORAL_TABLET | Freq: Every day | ORAL | Status: DC

## 2013-10-06 NOTE — Telephone Encounter (Signed)
rx sent to pharmacy

## 2013-10-06 NOTE — Telephone Encounter (Signed)
Message copied by Mannie Stabile on Mon Oct 06, 2013  4:42 PM ------      Message from: Willodean Rosenthal      Created: Mon Oct 06, 2013 12:45 PM      Regarding: RE: Needs Generic Birth Control       Yes.  Generic Orthocyclen is fine.            clh-S      ----- Message -----         From: Faith Rogue Ivey Nembhard, LPN         Sent: 10/06/2013  11:20 AM           To: Willodean Rosenthal, MD      Subject: Needs Generic Birth Control                              Patients pharmacy called to see if we can switch her birth control from LoEstrin24Fe to Orthocyclen or Orthotricyclen? Patient does not have insurance and can't afford the Loestrin.            Thanks,             Marchelle Folks       ------

## 2013-10-07 ENCOUNTER — Encounter: Payer: Self-pay | Admitting: Family Medicine

## 2013-10-07 ENCOUNTER — Ambulatory Visit (INDEPENDENT_AMBULATORY_CARE_PROVIDER_SITE_OTHER): Payer: Worker's Compensation | Admitting: Family Medicine

## 2013-10-07 VITALS — BP 145/93 | HR 80 | Temp 97.9°F | Wt 160.0 lb

## 2013-10-07 DIAGNOSIS — M4722 Other spondylosis with radiculopathy, cervical region: Secondary | ICD-10-CM

## 2013-10-07 DIAGNOSIS — I10 Essential (primary) hypertension: Secondary | ICD-10-CM

## 2013-10-07 DIAGNOSIS — M4712 Other spondylosis with myelopathy, cervical region: Secondary | ICD-10-CM

## 2013-10-07 DIAGNOSIS — K649 Unspecified hemorrhoids: Secondary | ICD-10-CM | POA: Insufficient documentation

## 2013-10-07 NOTE — Assessment & Plan Note (Signed)
No acute change in symptom. She has close orthopedic follow up. Continue pain control as instructed by ortho. Miralax to relieve narcotic related constipation.

## 2013-10-07 NOTE — Assessment & Plan Note (Signed)
Worsening over the last 2 wks. Appears to be mildly thrombosed. I recommended ER assessment,but patient prefer outpatient assessment. She is requesting encounter note copy for [place] so she can see a Careers adviser. As discussed with her note would be faxed at her request however if symptom worsens to go to the ER for evaluation and treatment. In the interim continue Anusol,Miralax for constipation and Sitz bath also recommended. Patient has not had colonoscopy,rectal exam for stool guaiac difficult due to rectal pain,but would likely be positive due to open ulcer on her hemorrhoid. Advised to discuss colonoscopy with the Surgeon when she is seen. I will readdress issue at her next visit. Patient verbalized understanding of instructions given.
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
## 2013-10-07 NOTE — Assessment & Plan Note (Signed)
Compliant with BP medication. BP slightly elevated today,might be due to pain. No adjustment done to her BP medication today. I will reassess at next visit.

## 2013-10-07 NOTE — Patient Instructions (Signed)
It was nice seeing you today,I am sorry you still having hemorrhoid pain. Continue Anusol and try sitz bath with Epson salt. Sitz Bath A sitz bath is a warm water bath taken in the sitting position. The water covers only the hips and butt (buttocks). It may be used for either healing or cleaning purposes. Sitz baths are also used to relieve pain, itching, or muscle tightening (spasms). The water may contain medicine. Moist heat will help you heal and relax.  HOME CARE  Take 3 to 4 sitz baths a day. 1. Fill the bathtub half-full with warm water. 2. Sit in the water and open the drain a little. 3. Turn on the warm water to keep the tub half-full. Keep the water running constantly. 4. Soak in the water for 15 to 20 minutes. 5. After the sitz bath, pat the affected area dry. GET HELP RIGHT AWAY IF: You get worse instead of better. Stop the sitz baths if you get worse. MAKE SURE YOU:  Understand these instructions.  Will watch your condition.  Will get help right away if you are not doing well or get worse. Document Released: 01/11/2005 Document Revised: 08/28/2012 Document Reviewed: 04/03/2011 [facility] Patient Information 2014 [place], [place].

## 2013-10-08 ENCOUNTER — Encounter (HOSPITAL_COMMUNITY): Payer: Self-pay | Admitting: Pharmacy Technician

## 2013-10-08 ENCOUNTER — Other Ambulatory Visit: Payer: Self-pay | Admitting: Family Medicine

## 2013-10-08 MED ORDER — QUINAPRIL-HYDROCHLOROTHIAZIDE 20-12.5 MG PO TABS: 1.0000 | ORAL_TABLET | Freq: Every day | ORAL | Status: DC

## 2013-10-08 NOTE — Telephone Encounter (Signed)
Pt is aware that rx was sent to pharmacy and that her office note will be mailed to her.  Jazmin Hartsell,CMA

## 2013-10-08 NOTE — Telephone Encounter (Signed)
Pt called because she wanted to know the status of the letter that Dr. Lum Babe was going to write. Also she is out of her hemorrhoids medication and would like another refill. JW

## 2013-10-10 ENCOUNTER — Encounter (HOSPITAL_COMMUNITY): Payer: Self-pay

## 2013-10-10 ENCOUNTER — Telehealth: Payer: Self-pay | Admitting: *Deleted

## 2013-10-10 ENCOUNTER — Encounter (HOSPITAL_COMMUNITY)
Admission: RE | Admit: 2013-10-10 | Discharge: 2013-10-10 | Disposition: A | Discharge status: Home / Self Care | Payer: Worker's Compensation | Source: Ambulatory Visit | Attending: Orthopedic Surgery | Admitting: Orthopedic Surgery

## 2013-10-10 ENCOUNTER — Telehealth: Payer: Self-pay | Admitting: Family Medicine

## 2013-10-10 DIAGNOSIS — Z01812 Encounter for preprocedural laboratory examination: Secondary | ICD-10-CM

## 2013-10-10 DIAGNOSIS — Z01818 Encounter for other preprocedural examination: Secondary | ICD-10-CM | POA: Insufficient documentation

## 2013-10-10 DIAGNOSIS — R32 Unspecified urinary incontinence: Secondary | ICD-10-CM

## 2013-10-10 DIAGNOSIS — N920 Excessive and frequent menstruation with regular cycle: Secondary | ICD-10-CM

## 2013-10-10 HISTORY — DX: Other reaction to spinal and lumbar puncture: G97.1

## 2013-10-10 HISTORY — DX: Low back pain: M54.5

## 2013-10-10 HISTORY — DX: Constipation, unspecified: K59.00

## 2013-10-10 HISTORY — DX: Dysphagia, unspecified: R13.10

## 2013-10-10 HISTORY — DX: Shortness of breath: R06.02

## 2013-10-10 HISTORY — DX: Nausea: R11.0

## 2013-10-10 HISTORY — DX: Cervicalgia: M54.2

## 2013-10-10 HISTORY — DX: Unspecified hemorrhoids: K64.9

## 2013-10-10 HISTORY — DX: Insomnia, unspecified: G47.00

## 2013-10-10 HISTORY — DX: Dizziness and giddiness: R42

## 2013-10-10 HISTORY — DX: Effusion, unspecified joint: M25.40

## 2013-10-10 HISTORY — DX: Stress incontinence (female) (male): N39.3

## 2013-10-10 HISTORY — DX: Major depressive disorder, single episode, unspecified: F32.9

## 2013-10-10 HISTORY — DX: Unspecified asthma, uncomplicated: J45.909

## 2013-10-10 HISTORY — DX: Depression, unspecified: F32.A

## 2013-10-10 HISTORY — DX: Weakness: R53.1

## 2013-10-10 HISTORY — DX: Low back pain, unspecified: M54.50

## 2013-10-10 LAB — COMPREHENSIVE METABOLIC PANEL
AST: 20 U/L (ref 0–37)
BUN: 17 mg/dL (ref 6–23)
CO2: 28 mEq/L (ref 19–32)
Calcium: 8.9 mg/dL (ref 8.4–10.5)
Chloride: 98 mEq/L (ref 96–112)
Creatinine, Ser: 0.89 mg/dL (ref 0.50–1.10)
GFR calc Af Amer: 86 mL/min — ABNORMAL LOW (ref >90)
GFR calc non Af Amer: 74 mL/min — ABNORMAL LOW (ref >90)
Glucose, Bld: 88 mg/dL (ref 70–99)
Total Bilirubin: 0.2 mg/dL — ABNORMAL LOW (ref 0.3–1.2)

## 2013-10-10 LAB — URINALYSIS, ROUTINE W REFLEX MICROSCOPIC
Bilirubin Urine: NEGATIVE
Glucose, UA: NEGATIVE mg/dL
Ketones, ur: NEGATIVE mg/dL
Leukocytes, UA: NEGATIVE
Protein, ur: NEGATIVE mg/dL
pH: 5.5 (ref 5.0–8.0)

## 2013-10-10 LAB — CBC WITH DIFFERENTIAL/PLATELET
Basophils Absolute: 0 10*3/uL (ref 0.0–0.1)
Eosinophils Absolute: 0.1 10*3/uL (ref 0.0–0.7)
Eosinophils Relative: 2 % (ref 0–5)
HCT: 36.6 % (ref 36.0–46.0)
Lymphocytes Relative: 32 % (ref 12–46)
MCH: 29.4 pg (ref 26.0–34.0)
MCV: 86.1 fL (ref 78.0–100.0)
Monocytes Absolute: 0.2 10*3/uL (ref 0.1–1.0)
Monocytes Relative: 6 % (ref 3–12)
Platelets: 287 10*3/uL (ref 150–400)
RDW: 13.3 % (ref 11.5–15.5)
WBC: 3.6 10*3/uL — ABNORMAL LOW (ref 4.0–10.5)

## 2013-10-10 LAB — URINE MICROSCOPIC-ADD ON

## 2013-10-10 LAB — PROTIME-INR
INR: 0.95 (ref 0.00–1.49)
Prothrombin Time: 12.5 seconds (ref 11.6–15.2)

## 2013-10-10 LAB — SURGICAL PCR SCREEN
MRSA, PCR: POSITIVE — AB
Staphylococcus aureus: POSITIVE — AB

## 2013-10-10 LAB — TYPE AND SCREEN: ABO/RH(D): A POS

## 2013-10-10 LAB — HCG, SERUM, QUALITATIVE: Preg, Serum: NEGATIVE

## 2013-10-10 MED ORDER — OXYBUTYNIN CHLORIDE ER 5 MG PO TB24: 10.0000 mg | ORAL_TABLET | Freq: Every day | ORAL | Status: DC

## 2013-10-10 NOTE — Telephone Encounter (Signed)
Discussed with Dr. Burnice Logan- Katrinka Blazing- approved change to 10 mg- called [facility] and gave order to change to Ditropan XL 10mg  daily. Dispense 30 tablets, 3 refills.

## 2013-10-10 NOTE — Progress Notes (Addendum)
  Pt doesn't have a cardiologist  Pt states she has to go back to [place]-pt states she has an enlarged heart valve  Denies ever having a stress test/heart cath  Echo report in epic from 2013  EKG and CXR in epic from 10-29-12   Medical Md is Dr.Kehinde Lum Babe with [facility]

## 2013-10-10 NOTE — Telephone Encounter (Signed)
Received a call from Tobi Bastos at [facility] - states got the order for Ditropan XL 5mg  , but patient has a coupon for, but is on backorder. Wants to see if we would consider Ditropan XL 10mg  or 15 mg.?

## 2013-10-10 NOTE — Telephone Encounter (Signed)
Maria Griffith called and said the hemerroid surgery has been approved
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
## 2013-10-10 NOTE — Addendum Note (Signed)
Addended by: Gerome Apley on: 10/10/2013 08:57 AM   Modules accepted: Orders

## 2013-10-15 MED ORDER — CEFAZOLIN SODIUM-DEXTROSE 2-3 GM-% IV SOLR
2.0000 g | INTRAVENOUS | Status: AC
  Administered 2013-10-16: 2 g via INTRAVENOUS
  Filled 2013-10-15: qty 50

## 2013-10-15 NOTE — Progress Notes (Signed)
Pt. Notified of time change,to arrive at 1040 in the morning.Pt. Voices undersranding.
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
## 2013-10-16 ENCOUNTER — Ambulatory Visit (HOSPITAL_COMMUNITY): Payer: Worker's Compensation | Admitting: Certified Registered Nurse Anesthetist

## 2013-10-16 ENCOUNTER — Ambulatory Visit (HOSPITAL_COMMUNITY): Payer: Worker's Compensation

## 2013-10-16 ENCOUNTER — Encounter (HOSPITAL_COMMUNITY): Payer: Worker's Compensation | Admitting: Vascular Surgery

## 2013-10-16 ENCOUNTER — Encounter (HOSPITAL_COMMUNITY): Payer: Self-pay | Admitting: Anesthesiology

## 2013-10-16 ENCOUNTER — Ambulatory Visit: Payer: Self-pay | Admitting: Obstetrics & Gynecology

## 2013-10-16 ENCOUNTER — Encounter (HOSPITAL_COMMUNITY)
Admission: RE | Disposition: A | Discharge status: Home Health | Payer: Self-pay | Source: Ambulatory Visit | Attending: Orthopedic Surgery

## 2013-10-16 ENCOUNTER — Inpatient Hospital Stay (HOSPITAL_COMMUNITY)
Admission: RE | Admit: 2013-10-16 | Discharge: 2013-10-18 | DRG: 473 | Disposition: A | Discharge status: Home Health | Payer: Worker's Compensation | Source: Ambulatory Visit | Attending: Orthopedic Surgery | Admitting: Orthopedic Surgery

## 2013-10-16 DIAGNOSIS — J4540 Moderate persistent asthma, uncomplicated: Secondary | ICD-10-CM

## 2013-10-16 DIAGNOSIS — I10 Essential (primary) hypertension: Secondary | ICD-10-CM | POA: Diagnosis present

## 2013-10-16 DIAGNOSIS — K649 Unspecified hemorrhoids: Secondary | ICD-10-CM

## 2013-10-16 DIAGNOSIS — F4310 Post-traumatic stress disorder, unspecified: Secondary | ICD-10-CM | POA: Diagnosis present

## 2013-10-16 DIAGNOSIS — R079 Chest pain, unspecified: Secondary | ICD-10-CM

## 2013-10-16 DIAGNOSIS — R1310 Dysphagia, unspecified: Secondary | ICD-10-CM | POA: Diagnosis present

## 2013-10-16 DIAGNOSIS — F331 Major depressive disorder, recurrent, moderate: Secondary | ICD-10-CM

## 2013-10-16 DIAGNOSIS — R05 Cough: Secondary | ICD-10-CM

## 2013-10-16 DIAGNOSIS — M4722 Other spondylosis with radiculopathy, cervical region: Secondary | ICD-10-CM

## 2013-10-16 DIAGNOSIS — F3289 Other specified depressive episodes: Secondary | ICD-10-CM | POA: Diagnosis present

## 2013-10-16 DIAGNOSIS — F419 Anxiety disorder, unspecified: Secondary | ICD-10-CM

## 2013-10-16 DIAGNOSIS — J45909 Unspecified asthma, uncomplicated: Secondary | ICD-10-CM | POA: Diagnosis present

## 2013-10-16 DIAGNOSIS — K5900 Constipation, unspecified: Secondary | ICD-10-CM | POA: Diagnosis present

## 2013-10-16 DIAGNOSIS — D649 Anemia, unspecified: Secondary | ICD-10-CM

## 2013-10-16 DIAGNOSIS — I359 Nonrheumatic aortic valve disorder, unspecified: Secondary | ICD-10-CM | POA: Diagnosis present

## 2013-10-16 DIAGNOSIS — M4712 Other spondylosis with myelopathy, cervical region: Principal | ICD-10-CM | POA: Diagnosis present

## 2013-10-16 DIAGNOSIS — F411 Generalized anxiety disorder: Secondary | ICD-10-CM | POA: Diagnosis present

## 2013-10-16 DIAGNOSIS — F329 Major depressive disorder, single episode, unspecified: Secondary | ICD-10-CM | POA: Diagnosis present

## 2013-10-16 HISTORY — DX: Chest pain, unspecified: R07.9

## 2013-10-16 HISTORY — PX: ANTERIOR CERVICAL DECOMP/DISCECTOMY FUSION: SHX1161

## 2013-10-16 SURGERY — ANTERIOR CERVICAL DECOMPRESSION/DISCECTOMY FUSION 3 LEVELS
Anesthesia: General | Site: Spine Cervical | Wound class: Clean

## 2013-10-16 MED ORDER — HYDROMORPHONE HCL PF 1 MG/ML IJ SOLN
INTRAMUSCULAR | Status: DC | PRN
  Administered 2013-10-16: 0.5 mg via INTRAVENOUS

## 2013-10-16 MED ORDER — GABAPENTIN 600 MG PO TABS
600.0000 mg | ORAL_TABLET | Freq: Three times a day (TID) | ORAL | Status: DC
  Administered 2013-10-17: 600 mg via ORAL
  Filled 2013-10-16 (×7): qty 1

## 2013-10-16 MED ORDER — LACTATED RINGERS IV SOLN
INTRAVENOUS | Status: DC | PRN
  Administered 2013-10-16 (×2): via INTRAVENOUS

## 2013-10-16 MED ORDER — POVIDONE-IODINE 7.5 % EX SOLN
Freq: Once | CUTANEOUS | Status: DC
  Filled 2013-10-16: qty 118

## 2013-10-16 MED ORDER — PHENOL 1.4 % MT LIQD
1.0000 | OROMUCOSAL | Status: DC | PRN
  Administered 2013-10-16: 1 via OROMUCOSAL
  Filled 2013-10-16: qty 177

## 2013-10-16 MED ORDER — ACETAMINOPHEN 650 MG RE SUPP: 650.0000 mg | RECTAL | Status: DC | PRN

## 2013-10-16 MED ORDER — NORGESTIMATE-ETH ESTRADIOL 0.25-35 MG-MCG PO TABS
1.0000 | ORAL_TABLET | Freq: Every day | ORAL | Status: DC
  Filled 2013-10-16: qty 1

## 2013-10-16 MED ORDER — MORPHINE SULFATE 2 MG/ML IJ SOLN: 1.0000 mg | INTRAMUSCULAR | Status: DC | PRN

## 2013-10-16 MED ORDER — HYDROCHLOROTHIAZIDE 25 MG PO TABS
25.0000 mg | ORAL_TABLET | Freq: Every day | ORAL | Status: DC
  Administered 2013-10-16 – 2013-10-18 (×3): 25 mg via ORAL
  Filled 2013-10-16 (×3): qty 1

## 2013-10-16 MED ORDER — BUPIVACAINE-EPINEPHRINE 0.25% -1:200000 IJ SOLN
INTRAMUSCULAR | Status: DC | PRN
  Administered 2013-10-16: 5 mL

## 2013-10-16 MED ORDER — CEFAZOLIN SODIUM 1-5 GM-% IV SOLN
1.0000 g | Freq: Three times a day (TID) | INTRAVENOUS | Status: AC
  Administered 2013-10-16 – 2013-10-17 (×2): 1 g via INTRAVENOUS
  Filled 2013-10-16 (×2): qty 50

## 2013-10-16 MED ORDER — MIDAZOLAM HCL 5 MG/5ML IJ SOLN
INTRAMUSCULAR | Status: DC | PRN
  Administered 2013-10-16: 2 mg via INTRAVENOUS

## 2013-10-16 MED ORDER — OXYCODONE HCL 5 MG PO TABS: 5.0000 mg | ORAL_TABLET | Freq: Once | ORAL | Status: DC | PRN

## 2013-10-16 MED ORDER — BUPIVACAINE-EPINEPHRINE PF 0.25-1:200000 % IJ SOLN
INTRAMUSCULAR | Status: AC
  Filled 2013-10-16: qty 30

## 2013-10-16 MED ORDER — LACTATED RINGERS IV SOLN
INTRAVENOUS | Status: DC
  Administered 2013-10-16: 11:00:00 via INTRAVENOUS

## 2013-10-16 MED ORDER — ACETAMINOPHEN 325 MG PO TABS: 650.0000 mg | ORAL_TABLET | ORAL | Status: DC | PRN

## 2013-10-16 MED ORDER — NEOSTIGMINE METHYLSULFATE 1 MG/ML IJ SOLN
INTRAMUSCULAR | Status: DC | PRN
  Administered 2013-10-16: 3 mg via INTRAVENOUS

## 2013-10-16 MED ORDER — GLYCOPYRROLATE 0.2 MG/ML IJ SOLN
INTRAMUSCULAR | Status: DC | PRN
  Administered 2013-10-16: 0.4 mg via INTRAVENOUS

## 2013-10-16 MED ORDER — DIAZEPAM 5 MG PO TABS
5.0000 mg | ORAL_TABLET | Freq: Four times a day (QID) | ORAL | Status: DC | PRN
  Administered 2013-10-17 – 2013-10-18 (×5): 5 mg via ORAL
  Filled 2013-10-16 (×6): qty 1

## 2013-10-16 MED ORDER — THROMBIN 20000 UNITS EX SOLR
CUTANEOUS | Status: AC
  Filled 2013-10-16: qty 20000

## 2013-10-16 MED ORDER — DEXAMETHASONE SODIUM PHOSPHATE 4 MG/ML IJ SOLN
INTRAMUSCULAR | Status: DC | PRN
  Administered 2013-10-16: 4 mg via INTRAVENOUS

## 2013-10-16 MED ORDER — SODIUM CHLORIDE 0.9 % IV SOLN: 250.0000 mL | INTRAVENOUS | Status: DC

## 2013-10-16 MED ORDER — THROMBIN 20000 UNITS EX SOLR
CUTANEOUS | Status: DC | PRN
  Administered 2013-10-16: 14:00:00

## 2013-10-16 MED ORDER — HYDROMORPHONE HCL PF 1 MG/ML IJ SOLN
0.2500 mg | INTRAMUSCULAR | Status: DC | PRN
  Administered 2013-10-16 (×3): 0.5 mg via INTRAVENOUS

## 2013-10-16 MED ORDER — HYDROMORPHONE HCL PF 1 MG/ML IJ SOLN
INTRAMUSCULAR | Status: AC
  Administered 2013-10-16: 0.5 mg via INTRAVENOUS
  Filled 2013-10-16: qty 1

## 2013-10-16 MED ORDER — OXYCODONE HCL 5 MG/5ML PO SOLN: 5.0000 mg | Freq: Once | ORAL | Status: DC | PRN

## 2013-10-16 MED ORDER — AMLODIPINE BESYLATE 5 MG PO TABS
5.0000 mg | ORAL_TABLET | Freq: Every day | ORAL | Status: DC
  Administered 2013-10-17 – 2013-10-18 (×2): 5 mg via ORAL
  Filled 2013-10-16 (×2): qty 1

## 2013-10-16 MED ORDER — PROPOFOL 10 MG/ML IV BOLUS
INTRAVENOUS | Status: DC | PRN
  Administered 2013-10-16: 150 mg via INTRAVENOUS

## 2013-10-16 MED ORDER — ALBUTEROL SULFATE HFA 108 (90 BASE) MCG/ACT IN AERS
2.0000 | INHALATION_SPRAY | Freq: Four times a day (QID) | RESPIRATORY_TRACT | Status: DC | PRN
Start: 2013-10-16 — End: 2013-10-18
  Administered 2013-10-18: 2 via RESPIRATORY_TRACT
  Filled 2013-10-16: qty 6.7

## 2013-10-16 MED ORDER — POLYETHYLENE GLYCOL 3350 17 GM/SCOOP PO POWD
17.0000 g | Freq: Every day | ORAL | Status: DC | PRN
  Administered 2013-10-17: 17 g via ORAL
  Filled 2013-10-16 (×2): qty 255

## 2013-10-16 MED ORDER — DOCUSATE SODIUM 100 MG PO CAPS
100.0000 mg | ORAL_CAPSULE | Freq: Two times a day (BID) | ORAL | Status: DC | PRN
  Administered 2013-10-18: 100 mg via ORAL
  Filled 2013-10-16: qty 1

## 2013-10-16 MED ORDER — OXYBUTYNIN CHLORIDE ER 10 MG PO TB24
10.0000 mg | ORAL_TABLET | Freq: Every day | ORAL | Status: DC
  Administered 2013-10-17 – 2013-10-18 (×2): 10 mg via ORAL
  Filled 2013-10-16 (×2): qty 1

## 2013-10-16 MED ORDER — DIPHENHYDRAMINE HCL 50 MG/ML IJ SOLN
INTRAMUSCULAR | Status: AC
  Filled 2013-10-16: qty 1

## 2013-10-16 MED ORDER — ONDANSETRON HCL 4 MG/2ML IJ SOLN: 4.0000 mg | INTRAMUSCULAR | Status: DC | PRN

## 2013-10-16 MED ORDER — ARTIFICIAL TEARS OP OINT
TOPICAL_OINTMENT | OPHTHALMIC | Status: DC | PRN
  Administered 2013-10-16: 1 via OPHTHALMIC

## 2013-10-16 MED ORDER — SODIUM CHLORIDE 0.9 % IJ SOLN
3.0000 mL | Freq: Two times a day (BID) | INTRAMUSCULAR | Status: DC
  Administered 2013-10-16 – 2013-10-17 (×2): 3 mL via INTRAVENOUS

## 2013-10-16 MED ORDER — DIPHENHYDRAMINE HCL 50 MG/ML IJ SOLN: 6.2500 mg | INTRAMUSCULAR | Status: DC | PRN

## 2013-10-16 MED ORDER — 0.9 % SODIUM CHLORIDE (POUR BTL) OPTIME
TOPICAL | Status: DC | PRN
  Administered 2013-10-16: 1000 mL

## 2013-10-16 MED ORDER — LIDOCAINE HCL (CARDIAC) 20 MG/ML IV SOLN
INTRAVENOUS | Status: DC | PRN
  Administered 2013-10-16: 40 mg via INTRAVENOUS

## 2013-10-16 MED ORDER — QUINAPRIL-HYDROCHLOROTHIAZIDE 20-12.5 MG PO TABS: 1.0000 | ORAL_TABLET | Freq: Every day | ORAL | Status: DC

## 2013-10-16 MED ORDER — ONDANSETRON HCL 4 MG PO TABS: 4.0000 mg | ORAL_TABLET | Freq: Three times a day (TID) | ORAL | Status: DC | PRN

## 2013-10-16 MED ORDER — SODIUM CHLORIDE 0.9 % IJ SOLN: 3.0000 mL | INTRAMUSCULAR | Status: DC | PRN

## 2013-10-16 MED ORDER — MENTHOL 3 MG MT LOZG
1.0000 | LOZENGE | OROMUCOSAL | Status: DC | PRN
  Administered 2013-10-17: 3 mg via ORAL
  Filled 2013-10-16: qty 9

## 2013-10-16 MED ORDER — HYDROMORPHONE HCL PF 1 MG/ML IJ SOLN
INTRAMUSCULAR | Status: AC
  Filled 2013-10-16: qty 1

## 2013-10-16 MED ORDER — PHENYLEPHRINE HCL 10 MG/ML IJ SOLN
INTRAMUSCULAR | Status: DC | PRN
  Administered 2013-10-16 (×2): 80 ug via INTRAVENOUS

## 2013-10-16 MED ORDER — DIPHENHYDRAMINE HCL 12.5 MG/5ML PO ELIX
12.5000 mg | ORAL_SOLUTION | Freq: Four times a day (QID) | ORAL | Status: DC | PRN
  Administered 2013-10-16: 12.5 mg via ORAL
  Filled 2013-10-16: qty 10

## 2013-10-16 MED ORDER — FLUTICASONE PROPIONATE HFA 220 MCG/ACT IN AERO
1.0000 | INHALATION_SPRAY | Freq: Two times a day (BID) | RESPIRATORY_TRACT | Status: DC
  Administered 2013-10-17 – 2013-10-18 (×3): 1 via RESPIRATORY_TRACT
  Filled 2013-10-16: qty 12

## 2013-10-16 MED ORDER — ONDANSETRON HCL 4 MG/2ML IJ SOLN
INTRAMUSCULAR | Status: DC | PRN
  Administered 2013-10-16: 4 mg via INTRAVENOUS

## 2013-10-16 MED ORDER — TRAZODONE HCL 100 MG PO TABS
100.0000 mg | ORAL_TABLET | Freq: Every day | ORAL | Status: DC
  Administered 2013-10-16 – 2013-10-17 (×2): 100 mg via ORAL
  Filled 2013-10-16 (×3): qty 1

## 2013-10-16 MED ORDER — ROCURONIUM BROMIDE 100 MG/10ML IV SOLN
INTRAVENOUS | Status: DC | PRN
  Administered 2013-10-16: 50 mg via INTRAVENOUS

## 2013-10-16 MED ORDER — HYDROXYZINE HCL 25 MG PO TABS: 25.0000 mg | ORAL_TABLET | Freq: Three times a day (TID) | ORAL | Status: DC | PRN

## 2013-10-16 MED ORDER — FENTANYL CITRATE 0.05 MG/ML IJ SOLN
INTRAMUSCULAR | Status: DC | PRN
  Administered 2013-10-16: 200 ug via INTRAVENOUS
  Administered 2013-10-16 (×6): 50 ug via INTRAVENOUS

## 2013-10-16 MED ORDER — SERTRALINE HCL 100 MG PO TABS
100.0000 mg | ORAL_TABLET | Freq: Every day | ORAL | Status: DC
  Administered 2013-10-16 – 2013-10-18 (×3): 100 mg via ORAL
  Filled 2013-10-16 (×3): qty 1

## 2013-10-16 MED ORDER — OXYCODONE-ACETAMINOPHEN 5-325 MG PO TABS
1.0000 | ORAL_TABLET | ORAL | Status: DC | PRN
  Administered 2013-10-16: 2 via ORAL
  Administered 2013-10-17 (×2): 1 via ORAL
  Administered 2013-10-17 – 2013-10-18 (×5): 2 via ORAL
  Filled 2013-10-16: qty 2
  Filled 2013-10-16: qty 1
  Filled 2013-10-16 (×2): qty 2
  Filled 2013-10-16: qty 1
  Filled 2013-10-16 (×3): qty 2
  Filled 2013-10-16: qty 1
  Filled 2013-10-16: qty 2

## 2013-10-16 MED ORDER — ZOLPIDEM TARTRATE 5 MG PO TABS: 5.0000 mg | ORAL_TABLET | Freq: Every evening | ORAL | Status: DC | PRN

## 2013-10-16 MED ORDER — LISINOPRIL 20 MG PO TABS
20.0000 mg | ORAL_TABLET | Freq: Every day | ORAL | Status: DC
Start: 2013-10-16 — End: 2013-10-18
  Administered 2013-10-16 – 2013-10-18 (×3): 20 mg via ORAL
  Filled 2013-10-16 (×3): qty 1

## 2013-10-16 SURGICAL SUPPLY — 76 items
APL SKNCLS STERI-STRIP NONHPOA (GAUZE/BANDAGES/DRESSINGS) ×1
BENZOIN TINCTURE PRP APPL 2/3 (GAUZE/BANDAGES/DRESSINGS) ×2 IMPLANT
BIT DRILL NEURO 2X3.1 SFT TUCH (MISCELLANEOUS) ×1 IMPLANT
BIT DRILL SKYLINE 12MM (BIT) IMPLANT
BLADE SURG 15 STRL LF DISP TIS (BLADE) ×1 IMPLANT
BLADE SURG 15 STRL SS (BLADE) ×2
BLADE SURG ROTATE 9660 (MISCELLANEOUS) ×2 IMPLANT
BUR MATCHSTICK NEURO 3.0 LAGG (BURR) IMPLANT
CARTRIDGE OIL MAESTRO DRILL (MISCELLANEOUS) ×1 IMPLANT
CLOTH BEACON ORANGE TIMEOUT ST (SAFETY) ×2 IMPLANT
CLSR STERI-STRIP ANTIMIC 1/2X4 (GAUZE/BANDAGES/DRESSINGS) ×1 IMPLANT
CORDS BIPOLAR (ELECTRODE) ×2 IMPLANT
COVER SURGICAL LIGHT HANDLE (MISCELLANEOUS) ×2 IMPLANT
CRADLE DONUT ADULT HEAD (MISCELLANEOUS) ×2 IMPLANT
DEVICE ENDSKLTN IMPLNT MED 6MM (Orthopedic Implant) IMPLANT
DEVICE ENDSKLTN MED 6 7MM (Orthopedic Implant) IMPLANT
DIFFUSER DRILL AIR PNEUMATIC (MISCELLANEOUS) ×2 IMPLANT
DRAIN JACKSON RD 7FR 3/32 (WOUND CARE) IMPLANT
DRAPE C-ARM 42X72 X-RAY (DRAPES) ×2 IMPLANT
DRAPE POUCH INSTRU U-SHP 10X18 (DRAPES) ×2 IMPLANT
DRAPE SURG 17X23 STRL (DRAPES) ×6 IMPLANT
DRILL BIT SKYLINE 12MM (BIT) ×2
DRILL NEURO 2X3.1 SOFT TOUCH (MISCELLANEOUS) ×2
DURAPREP 26ML APPLICATOR (WOUND CARE) ×2 IMPLANT
ELECT COATED BLADE 2.86 ST (ELECTRODE) ×2 IMPLANT
ELECT REM PT RETURN 9FT ADLT (ELECTROSURGICAL) ×2
ELECTRODE REM PT RTRN 9FT ADLT (ELECTROSURGICAL) ×1 IMPLANT
ENDOSKELETON IMPLANT MED 6MM (Orthopedic Implant) ×4 IMPLANT
ENDOSKELETON MED 6 7MM (Orthopedic Implant) ×2 IMPLANT
EVACUATOR SILICONE 100CC (DRAIN) IMPLANT
GAUZE SPONGE 4X4 16PLY XRAY LF (GAUZE/BANDAGES/DRESSINGS) ×2 IMPLANT
GLOVE BIO SURGEON STRL SZ7 (GLOVE) ×2 IMPLANT
GLOVE BIO SURGEON STRL SZ8 (GLOVE) ×2 IMPLANT
GLOVE BIOGEL PI IND STRL 7.0 (GLOVE) ×2 IMPLANT
GLOVE BIOGEL PI IND STRL 8 (GLOVE) ×1 IMPLANT
GLOVE BIOGEL PI INDICATOR 7.0 (GLOVE) ×2
GLOVE BIOGEL PI INDICATOR 8 (GLOVE) ×1
GOWN STRL NON-REIN LRG LVL3 (GOWN DISPOSABLE) ×2 IMPLANT
GOWN STRL REIN XL XLG (GOWN DISPOSABLE) ×2 IMPLANT
IV CATH 14GX2 1/4 (CATHETERS) ×2 IMPLANT
KIT BASIN OR (CUSTOM PROCEDURE TRAY) ×2 IMPLANT
KIT ROOM TURNOVER OR (KITS) ×2 IMPLANT
MANIFOLD NEPTUNE II (INSTRUMENTS) ×2 IMPLANT
NDL SPNL 20GX3.5 QUINCKE YW (NEEDLE) ×1 IMPLANT
NEEDLE 27GAX1X1/2 (NEEDLE) ×2 IMPLANT
NEEDLE SPNL 20GX3.5 QUINCKE YW (NEEDLE) ×2 IMPLANT
NS IRRIG 1000ML POUR BTL (IV SOLUTION) ×2 IMPLANT
OIL CARTRIDGE MAESTRO DRILL (MISCELLANEOUS) ×2
PACK ORTHO CERVICAL (CUSTOM PROCEDURE TRAY) ×2 IMPLANT
PAD ARMBOARD 7.5X6 YLW CONV (MISCELLANEOUS) ×4 IMPLANT
PATTIES SURGICAL .5 X.5 (GAUZE/BANDAGES/DRESSINGS) IMPLANT
PATTIES SURGICAL .5 X1 (DISPOSABLE) IMPLANT
PIN DISTRACTION 14 (PIN) ×2 IMPLANT
PLATE SKYLINE 3 LVL 48MM (Plate) ×1 IMPLANT
PUTTY BONE DBX 5CC MIX (Putty) ×1 IMPLANT
SCREW SKYLINE VAR OS 14MM (Screw) ×1 IMPLANT
SCREW VAR SELF TAP SKYLINE 14M (Screw) ×7 IMPLANT
SPONGE GAUZE 4X4 12PLY (GAUZE/BANDAGES/DRESSINGS) ×2 IMPLANT
SPONGE INTESTINAL PEANUT (DISPOSABLE) ×2 IMPLANT
SPONGE SURGIFOAM ABS GEL 100 (HEMOSTASIS) IMPLANT
STRIP CLOSURE SKIN 1/2X4 (GAUZE/BANDAGES/DRESSINGS) ×2 IMPLANT
SURGIFLO TRUKIT (HEMOSTASIS) IMPLANT
SUT MNCRL AB 4-0 PS2 18 (SUTURE) IMPLANT
SUT SILK 4 0 (SUTURE)
SUT SILK 4-0 18XBRD TIE 12 (SUTURE) IMPLANT
SUT VIC AB 2-0 CT2 18 VCP726D (SUTURE) ×2 IMPLANT
SYR BULB IRRIGATION 50ML (SYRINGE) ×2 IMPLANT
SYR CONTROL 10ML LL (SYRINGE) ×2 IMPLANT
TAPE CLOTH 4X10 WHT NS (GAUZE/BANDAGES/DRESSINGS) ×2 IMPLANT
TAPE CLOTH SURG 4X10 WHT LF (GAUZE/BANDAGES/DRESSINGS) ×1 IMPLANT
TAPE UMBILICAL COTTON 1/8X30 (MISCELLANEOUS) ×4 IMPLANT
TOWEL OR 17X24 6PK STRL BLUE (TOWEL DISPOSABLE) ×2 IMPLANT
TOWEL OR 17X26 10 PK STRL BLUE (TOWEL DISPOSABLE) ×2 IMPLANT
TRAY FOLEY CATH 16FRSI W/METER (SET/KITS/TRAYS/PACK) ×2 IMPLANT
WATER STERILE IRR 1000ML POUR (IV SOLUTION) ×2 IMPLANT
YANKAUER SUCT BULB TIP NO VENT (SUCTIONS) ×2 IMPLANT

## 2013-10-16 NOTE — Anesthesia Postprocedure Evaluation (Signed)
  Anesthesia Post-op Note  Patient: Maria Griffith  Procedure(s) Performed: Procedure(s) with comments: ANTERIOR CERVICAL DECOMPRESSION/DISCECTOMY FUSION 3 LEVELS (N/A) - Anterior cervical decompression fusion, cervical 4-5, cervical 5-6, cervical 6-7 with instrumentation and allograft  Patient Location: PACU  Anesthesia Type:General  Level of Consciousness: awake  Airway and Oxygen Therapy: Patient Spontanous Breathing  Post-op Pain: mild  Post-op Assessment: Post-op Vital signs reviewed  Post-op Vital Signs: Reviewed  Complications: No apparent anesthesia complications

## 2013-10-16 NOTE — Transfer of Care (Signed)
Immediate Anesthesia Transfer of Care Note  Patient: Maria Griffith  Procedure(s) Performed: Procedure(s) with comments: ANTERIOR CERVICAL DECOMPRESSION/DISCECTOMY FUSION 3 LEVELS (N/A) - Anterior cervical decompression fusion, cervical 4-5, cervical 5-6, cervical 6-7 with instrumentation and allograft  Patient Location: PACU  Anesthesia Type:General  Level of Consciousness: awake and alert   Airway & Oxygen Therapy: Patient Spontanous Breathing and Patient connected to nasal cannula oxygen  Post-op Assessment: Report given to PACU RN, Post -op Vital signs reviewed and stable and Patient moving all extremities  Post vital signs: Reviewed and stable  Complications: No apparent anesthesia complications

## 2013-10-16 NOTE — Anesthesia Preprocedure Evaluation (Addendum)
Anesthesia Evaluation  Patient identified by MRN, date of birth, ID band Patient awake    Reviewed: Allergy & Precautions, H&P , NPO status , Patient's Chart, lab work & pertinent test results  Airway Mallampati: II TM Distance: >3 FB Neck ROM: Limited    Dental   Pulmonary shortness of breath, asthma ,  breath sounds clear to auscultation        Cardiovascular hypertension, Pt. on medications Rhythm:Regular Rate:Normal  '13 ECHO: normal LVF, EF 60-65% '13 CT angio heart: no coronary disease    Neuro/Psych  Headaches, Anxiety Depression Cervical stenosis  Neuromuscular disease    GI/Hepatic negative GI ROS, Neg liver ROS,   Endo/Other  negative endocrine ROS  Renal/GU negative Renal ROS     Musculoskeletal   Abdominal   Peds  Hematology   Anesthesia Other Findings   Reproductive/Obstetrics                          Anesthesia Physical Anesthesia Plan  ASA: II  Anesthesia Plan: General   Post-op Pain Management:    Induction: Intravenous  Airway Management Planned: Oral ETT  Additional Equipment:   Intra-op Plan:   Post-operative Plan: Extubation in OR  Informed Consent: I have reviewed the patients History and Physical, chart, labs and discussed the procedure including the risks, benefits and alternatives for the proposed anesthesia with the patient or authorized representative who has indicated his/her understanding and acceptance.     Plan Discussed with: CRNA and Surgeon  Anesthesia Plan Comments:         Anesthesia Quick Evaluation

## 2013-10-16 NOTE — Anesthesia Procedure Notes (Signed)
Procedure Name: Intubation Date/Time: 10/16/2013 12:38 PM Performed by: Orvilla Fus A Pre-anesthesia Checklist: Patient identified, Timeout performed, Emergency Drugs available, Suction available and Patient being monitored Patient Re-evaluated:Patient Re-evaluated prior to inductionOxygen Delivery Method: Circle system utilized Preoxygenation: Pre-oxygenation with 100% oxygen Intubation Type: IV induction Ventilation: Mask ventilation without difficulty Grade View: Grade I Tube type: Oral Tube size: 7.0 mm Number of attempts: 1 Airway Equipment and Method: Rigid stylet and Video-laryngoscopy Placement Confirmation: ETT inserted through vocal cords under direct vision,  breath sounds checked- equal and bilateral and positive ETCO2 Secured at: 21 cm Tube secured with: Tape Difficulty Due To: Difficult Airway- due to reduced neck mobility and Difficulty was anticipated

## 2013-10-16 NOTE — Preoperative (Signed)
Beta Blockers   Reason not to administer Beta Blockers:Not Applicable 

## 2013-10-17 ENCOUNTER — Encounter (HOSPITAL_COMMUNITY): Payer: Self-pay | Admitting: Family Medicine

## 2013-10-17 ENCOUNTER — Inpatient Hospital Stay (HOSPITAL_COMMUNITY): Payer: Worker's Compensation

## 2013-10-17 DIAGNOSIS — R51 Headache: Secondary | ICD-10-CM

## 2013-10-17 DIAGNOSIS — R059 Cough, unspecified: Secondary | ICD-10-CM

## 2013-10-17 DIAGNOSIS — R05 Cough: Secondary | ICD-10-CM

## 2013-10-17 DIAGNOSIS — K649 Unspecified hemorrhoids: Secondary | ICD-10-CM

## 2013-10-17 DIAGNOSIS — R079 Chest pain, unspecified: Secondary | ICD-10-CM

## 2013-10-17 DIAGNOSIS — I10 Essential (primary) hypertension: Secondary | ICD-10-CM

## 2013-10-17 LAB — LIPID PANEL
Cholesterol: 111 mg/dL (ref 0–200)
HDL: 59 mg/dL (ref >39)
LDL Cholesterol: 44 mg/dL (ref 0–99)
Total CHOL/HDL Ratio: 1.9 RATIO
Triglycerides: 40 mg/dL (ref <150)
VLDL: 8 mg/dL (ref 0–40)

## 2013-10-17 LAB — TROPONIN I: Troponin I: <0.3 ng/mL (ref <0.30)

## 2013-10-17 LAB — HEMOGLOBIN A1C: Mean Plasma Glucose: 114 mg/dL (ref <117)

## 2013-10-17 MED ORDER — CHLORHEXIDINE GLUCONATE CLOTH 2 % EX PADS: 6.0000 | MEDICATED_PAD | Freq: Every day | CUTANEOUS | Status: DC

## 2013-10-17 MED ORDER — MUPIROCIN 2 % EX OINT
1.0000 "application " | TOPICAL_OINTMENT | Freq: Two times a day (BID) | CUTANEOUS | Status: DC
  Administered 2013-10-17: 1 via NASAL
  Filled 2013-10-17: qty 22

## 2013-10-17 NOTE — Progress Notes (Signed)
She is 1 day PO from C4-7 ACDF for myelopathy and reports resolution of bilateral arm symptomatolgy. She has expected neck pain and tightness as well as swallowing difficulties. She could not tolerate regular foods this morning and had to request a soft/liquid diet. She did experience some chest pain and burning overnight with negative EKG and followed by family medicine believed to be from surgical positioning. She overall is pleased with her progress and is accompanied by her husband.  BP 112/72  Pulse 72  Temp(Src) 98.4 F (36.9 C) (Oral)  Resp 16  SpO2 100% Pt laying in hospital bed hard collar in place fitting appropriately, dressing CDI, neck soft and supple, NVI, SCD's in place.   1 day PO from C4-7 ACDF for myelopathy   -Swallowing difficulties: not at all unexpected S/P 3 level ACDF, progress diet as tolerated  -Hard Collar at all times appropriately tight  -[place] collar to bedside for showering use after 5 days  -Cont percocet/valium for pain control  -D/C home later today vs tomorrow pending improvements in swallowing   -F/U in office 2 weeks, appt made already

## 2013-10-17 NOTE — Progress Notes (Signed)
At 2300, pt complains of "constant pressure" in midsternal chest. She states that it becomes worse when she coughs and deep breathes. Encouraged use of IS and performed 10 breaths for me. EKG was done and resulted as NSR; compared with previous EKGs. Called on-call MD and spoke with Dannielle Burn, PA. Was told to continue to monitor pt and call back if symptoms get worse. No orders given. Will continue to monitor closely. Fraser Din, RN

## 2013-10-17 NOTE — Plan of Care (Addendum)
Received call from Sharlynn Oliphant, RN at 12:39 am concerning patient having chest pain.  Pt had EKG that showed no changes.  Went ahead and consulted [facility] hospitalist, Dr. Allena Katz to take a look at the pt to order any pertinent labs. Dr. Allena Katz will call with any significant findings.  Received a call back from Dr. Allena Katz stating the pt has been seen by the family medicine service and that we would need to contact the  family medicine on call physician for a consult.    Reached Dr. Durene Cal with the family medicine service, who will see the patient.  Dannielle Burn, PA-C

## 2013-10-17 NOTE — Progress Notes (Signed)
Pt's RXs given to pt's family member in [facility] before closing at 5pm. MD notified and pt consented.

## 2013-10-17 NOTE — Consult Note (Signed)
[facility] Attending Consult Note Patient seen and examined by me, resident note reviewed and I agree with assess/plan as outlined.  Patient with sternal chest pain that is reproducible with gentle palpation and with deep inhalation; no cough or shortness of breath or fevers.  ECG from 10/30 reviewed; now with negative initial TropI.  Low pretest probability for VTE. Agree with treatment for musculoskeletal cause with application warm compresses and analgesia; CXR this morning and repeat ECG this morning as well. Continue to monitor. Paula Compton, MD
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
## 2013-10-17 NOTE — Progress Notes (Signed)
PCP Note: [name] I stopped by to see Maria Griffith's at the hospital today,admitted for chest pain s/p cervical spine decompression surgery which she had yesterday. Surgical procedure was without complication as per documentation. Patient stated she developed central chest pain yesterday,her right arm/shoulder pain however improved since she had surgery. Feels better now with her chest pain,currently awaiting transfer to radiology for chest xray. Patient is happy with the care the FMTS team had provided so far and I appreciate their care for my patient. We discussed her hemorrhoid as well which she started is better now,she is scheduled via worker's comp to be evaluated by surgery for management of her hemorrhoid,in the interim she would continue conservative management. As discussed with Maria Brownstein, I will see her again after discharge from the hospital.

## 2013-10-17 NOTE — Progress Notes (Signed)
We would recommend DVT prophylaxis if patient will remain inpatient overnight. This is based on being on Ortho-Cyclen and post-op with movement limited.   Clare Gandy, MD PGY-1, [facility] 10/17/2013, 2:37 PM FPTS Service pager: [phone] (text pages welcome through [facility])
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
## 2013-10-17 NOTE — Progress Notes (Signed)
Orthopedic Tech Progress Note Patient Details:  Maria Griffith 1961/07/27 [id number] Phil[place] collar ordered and delivered to patient's room. Given to nurse.  Ortho Devices Type of Ortho Device: [place] cervical collar Ortho Device/Splint Interventions: Ordered   [place] R Thompson 10/17/2013, 11:19 AM

## 2013-10-18 DIAGNOSIS — F411 Generalized anxiety disorder: Secondary | ICD-10-CM

## 2013-10-18 DIAGNOSIS — J45909 Unspecified asthma, uncomplicated: Secondary | ICD-10-CM

## 2013-10-18 DIAGNOSIS — D649 Anemia, unspecified: Secondary | ICD-10-CM

## 2013-10-18 DIAGNOSIS — F331 Major depressive disorder, recurrent, moderate: Secondary | ICD-10-CM

## 2013-10-18 MED ORDER — POLYETHYLENE GLYCOL 3350 17 G PO PACK
17.0000 g | PACK | Freq: Every day | ORAL | Status: DC | PRN
  Administered 2013-10-18: 17 g via ORAL
  Filled 2013-10-18: qty 1

## 2013-10-18 NOTE — Progress Notes (Signed)
   CARE MANAGEMENT NOTE 10/18/2013  Patient:  ERCEL, NORMOYLE   Account Number:  [id number]  Date Initiated:  10/18/2013  Documentation initiated by:  [facility]  Subjective/Objective Assessment:   ZOX:WRUEA pain     Action/Plan:   discharge planning   Anticipated DC Date:  10/18/2013   Anticipated DC Plan:  HOME W HOME HEALTH SERVICES      DC Planning Services  CM consult      [facility] Choice  HOME HEALTH   Choice offered to / List presented to:  C-3 Spouse        HH arranged  HH-2 PT      HH agency  [facility]   Status of service:  Completed, signed off Medicare Important Message given?   (If response is "NO", the following Medicare IM given date fields will be blank) Date Medicare IM given:   Date Additional Medicare IM given:    Discharge Disposition:  HOME W HOME HEALTH SERVICES  Per UR Regulation:    If discussed at Long Length of Stay Meetings, dates discussed:    Comments:  10/18/13/14:10  CM spoke with pt's spouse for choice of HHPT (pt was in shower but okd).  [facility] was chosen for HHPT. Rolling Walker to be delivered to room prior to discharge. Address and contact number verified.  Referral faxed to [facility] for HHPT.  No other CM needs were communicated.  Freddy Jaksch, BSN, CM [phone].

## 2013-10-18 NOTE — Evaluation (Signed)
Occupational Therapy Evaluation Patient Details Name: Maria Griffith MRN: [id number] DOB: 24-Feb-1961 Today's Date: 10/18/2013 Time: 8469-6295 OT Time Calculation (min): 26 min  OT Assessment / Plan / Recommendation History of present illness pt s/p C4-5, C5-6, C6-7 anterior cervical decompression and fusion.   Clinical Impression   Pt admitted with above. Will continue to follow acutely to address below problem list.  Pt has good family/caregiver support at home.      OT Assessment  Patient needs continued OT Services    Follow Up Recommendations  Home health OT;Supervision/Assistance - 24 hour    Barriers to Discharge      Equipment Recommendations  None recommended by OT    Recommendations for Other Services    Frequency  Min 2X/week    Precautions / Restrictions Precautions Precautions: Cervical Required Braces or Orthoses: Cervical Brace Cervical Brace: Hard collar;At all times Restrictions Weight Bearing Restrictions: No   Pertinent Vitals/Pain See vitals    ADL  Eating/Feeding: Performed;Modified independent Where Assessed - Eating/Feeding: Chair Upper Body Bathing: Simulated;Supervision/safety Where Assessed - Upper Body Bathing: Unsupported sitting Lower Body Bathing: Simulated;Min guard Where Assessed - Lower Body Bathing: Unsupported sit to stand Upper Body Dressing: Simulated;Minimal assistance Where Assessed - Upper Body Dressing: Unsupported sitting Lower Body Dressing: Performed;Minimal assistance Where Assessed - Lower Body Dressing: Unsupported sit to stand Toilet Transfer: Simulated;Minimal assistance Toilet Transfer Method: Sit to stand Toilet Transfer Equipment:  (recliner) Equipment Used: Rolling walker (cervical collar) Transfers/Ambulation Related to ADLs: min assist with RW ADL Comments: Pt able to cross ankles over knees but required min assist to get socks started over toes for donning process (likely due to UE fatigue/weakness). Educated  pt and spouse on donning/doffing hard collar and philadelphia collar for bathing.  Reviewed cervical precautions with pt.  Pt has a tub bench at home and has been using it for showering prior to sx.    OT Diagnosis: Generalized weakness;Acute pain  OT Problem List: Decreased strength;Decreased activity tolerance;Impaired balance (sitting and/or standing);Decreased knowledge of precautions;Pain OT Treatment Interventions: Self-care/ADL training;DME and/or AE instruction;Patient/family education;Balance training   OT Goals(Current goals can be found in the care plan section) Acute Rehab OT Goals Patient Stated Goal: home OT Goal Formulation: With patient/family Time For Goal Achievement: 10/25/13 Potential to Achieve Goals: Good  Visit Information  Last OT Received On: 10/18/13 Assistance Needed: +1 History of Present Illness: pt s/p C4-5, C5-6, C6-7 anterior cervical decompression and fusion.       Prior Functioning     Home Living Family/patient expects to be discharged to:: Private residence Living Arrangements: Spouse/significant other Available Help at Discharge: Family;Available 24 hours/day Type of Home: Apartment Home Access: Level entry Home Layout: One level Home Equipment: Walker - 2 wheels;Cane - single point;Tub bench Prior Function Level of Independence: Independent with assistive device(s) Communication Communication: No difficulties Dominant Hand: Right         Vision/Perception     Cognition  Cognition Arousal/Alertness: Awake/alert Behavior During Therapy: WFL for tasks assessed/performed Overall Cognitive Status: Within Functional Limits for tasks assessed    Extremity/Trunk Assessment Upper Extremity Assessment Upper Extremity Assessment: Generalized weakness     Mobility Bed Mobility Bed Mobility: Not assessed Transfers Transfers: Stand to Sit;Sit to Stand Sit to Stand: 4: Min guard;From chair/3-in-1 Stand to Sit: 4: Min guard;To  chair/3-in-1     Exercise     Balance     End of Session OT - End of Session Equipment Utilized During Treatment: Rolling walker;Cervical collar  Activity Tolerance: Patient limited by fatigue Patient left: in chair;with call bell/phone within reach;with family/visitor present Nurse Communication: Mobility status;Patient requests pain meds  GO    10/18/2013 Cipriano Mile OTR/L Pager [phone] Office [phone]  Cipriano Mile 10/18/2013, 4:25 PM
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
## 2013-10-18 NOTE — Progress Notes (Signed)
Subjective: 2 Days Post-Op Procedure(s) (LRB): ANTERIOR CERVICAL DECOMPRESSION/DISCECTOMY FUSION 3 LEVELS (N/A) Patient reports pain as mild.  Pt up in bed eating this Am.  She has been wearing the hard collar at all times.   Objective: Vital signs in last 24 hours: Temp:  [98.1 F (36.7 C)-98.5 F (36.9 C)] 98.1 F (36.7 C) (11/01 0700) Pulse Rate:  [66-74] 66 (11/01 0700) Resp:  [16-18] 18 (11/01 0700) BP: (96-131)/(61-78) 118/76 mmHg (11/01 0700) SpO2:  [97 %-98 %] 98 % (11/01 0700)  Intake/Output from previous day: 10/31 0701 - 11/01 0700 In: 843 [P.O.:840; I.V.:3] Out: -  Intake/Output this shift:    No results found for this basename: HGB,  in the last 72 hours No results found for this basename: WBC, RBC, HCT, PLT,  in the last 72 hours No results found for this basename: NA, K, CL, CO2, BUN, CREATININE, GLUCOSE, CALCIUM,  in the last 72 hours No results found for this basename: LABPT, INR,  in the last 72 hours  Neurologically intact Neurovascular intact Sensation intact distally Intact pulses distally Dorsiflexion/Plantar flexion intact  Assessment/Plan: 2 Days Post-Op Procedure(s) (LRB): ANTERIOR CERVICAL DECOMPRESSION/DISCECTOMY FUSION 3 LEVELS (N/A) Discharge home with home health after pt meets with PT/OT to go over assessment of activities of daily living. Progress diet as tolerated. Continue percocet and valium for pain control. Follow up in 2 weeks with Dr. Yevette Edwards.  Drezden Seitzinger R 10/18/2013, 9:02 AM

## 2013-10-18 NOTE — Progress Notes (Signed)
   CARE MANAGEMENT NOTE 10/18/2013  Patient:  PRISCILA, BEAN   Account Number:  [id number]  Date Initiated:  10/18/2013  Documentation initiated by:  [facility]  Subjective/Objective Assessment:   ZOX:WRUEA pain     Action/Plan:   discharge planning   Anticipated DC Date:  10/18/2013   Anticipated DC Plan:  HOME W HOME HEALTH SERVICES      DC Planning Services  CM consult      [facility] Choice  HOME HEALTH   Choice offered to / List presented to:  C-3 Spouse        HH arranged  HH-2 PT      HH agency  [facility]   Status of service:  Completed, signed off Medicare Important Message given?   (If response is "NO", the following Medicare IM given date fields will be blank) Date Medicare IM given:   Date Additional Medicare IM given:    Discharge Disposition:  HOME W HOME HEALTH SERVICES  Per UR Regulation:    If discussed at Long Length of Stay Meetings, dates discussed:    Comments:  16:35 CM called to room as pt informs [facility] DME delivery person that this is all Workman's Comp (WC).  Pt had WC contact name and number in her phone memory but no card; WC rep is Randon Penderton and can be reached at [phone]. Pt understands HH arrangements will have to be made on Monday with the aide of Randon Penderton.  [facility] DME person had pt sign waiver for rolling walker.  Randon Penderton was called by CM and she states she will be waiting for call from CM on Monday to arrange HHPT.  CM will inform Monday staff of Workman's Comp to finalize HHPT.  HHPT cancelled at [facility].   Pt states she still wants [facility] if possible.  Freddy Jaksch, BSN, CM [phone].   10/18/13/14:10  CM spoke with pt's spouse for choice of HHPT (pt was in shower but okd).  [facility] was chosen for HHPT. Rolling Walker to be delivered to room prior to discharge. Address and contact number verified.  Referral faxed to [facility] for HHPT.  No other CM needs were communicated.  Freddy Jaksch, BSN, CM [phone].

## 2013-10-18 NOTE — Progress Notes (Signed)
Home with walker.

## 2013-10-18 NOTE — Evaluation (Signed)
Physical Therapy Evaluation Patient Details Name: Maria Griffith MRN: [id number] DOB: [birth year] Today's Date: 10/18/2013 Time: 4098-1191 PT Time Calculation (min): 44 min  PT Assessment / Plan / Recommendation History of Present Illness  pt s/p C4-5, C5-6, C6-7 anterior cervical decompression and fusion.  Clinical Impression  Pt progressing well however remains to fatigue quickly and reports of 10/10 surgical neck pain. Pt with good home set up and support. Pt safe to d/c home with use of RW, HHPT, and 24/7 supervision once medical stable.    PT Assessment  Patient needs continued PT services    Follow Up Recommendations  Home health PT;Supervision/Assistance - 24 hour    Does the patient have the potential to tolerate intense rehabilitation      Barriers to Discharge        Equipment Recommendations  None recommended by PT (pt has RW)    Recommendations for Other Services     Frequency Min 5X/week    Precautions / Restrictions Precautions Precautions: Cervical Required Braces or Orthoses: Cervical Brace Cervical Brace: Hard collar;At all times (philly collar for shower) Restrictions Weight Bearing Restrictions: No   Pertinent Vitals/Pain 10/10 surgical pain      Mobility  Bed Mobility Bed Mobility: Rolling Right;Right Sidelying to Sit;Sitting - Scoot to Edge of Bed Rolling Right: 4: Min guard Right Sidelying to Sit: 4: Min guard;HOB flat Sitting - Scoot to Edge of Bed: 4: Min guard Details for Bed Mobility Assistance: v/c's for technique/hand placement, increased time, cautious/guarded Transfers Transfers: Sit to Stand;Stand to Sit Sit to Stand: 4: Min guard;With upper extremity assist;From bed Stand to Sit: 4: Min guard;With upper extremity assist;To chair/3-in-1;To toilet Details for Transfer Assistance: v/c's for hand placement Ambulation/Gait Ambulation/Gait Assistance: 4: Min assist Ambulation Distance (Feet): 60 Feet Assistive device: Rolling  walker Ambulation/Gait Assistance Details: pt requires use of RW for safe ambulation. pt very unsteady without AD, pt agrees and desires to use RW as well. Pt reaching for objects to hold onto when not use RW. Pt with expected mild light headedness but improved by end of ambulation Gait Pattern: Step-through pattern;Decreased stride length;Wide base of support Gait velocity: slow Stairs: No  Assisted pt to bathroom - pt supervision for hygiene s/p urination   Exercises     PT Diagnosis: Difficulty walking;Generalized weakness  PT Problem List: Decreased strength;Decreased activity tolerance;Decreased balance;Decreased mobility PT Treatment Interventions: DME instruction;Gait training;Functional mobility training;Therapeutic activities;Therapeutic exercise     PT Goals(Current goals can be found in the care plan section) Acute Rehab PT Goals Patient Stated Goal: home PT Goal Formulation: With patient Time For Goal Achievement: 10/25/13 Potential to Achieve Goals: Good  Visit Information  Last PT Received On: 10/18/13 Assistance Needed: +1 History of Present Illness: pt s/p C4-5, C5-6, C6-7 anterior cervical decompression and fusion.       Prior Functioning  Home Living Family/patient expects to be discharged to:: Private residence Living Arrangements: Spouse/significant other Available Help at Discharge: Family;Available 24 hours/day Type of Home: Apartment Home Access: Level entry Home Layout: One level Home Equipment: Walker - 2 wheels;Cane - single point Additional Comments: pt has to go down hill to enter apartment Prior Function Level of Independence: Independent with assistive device(s) (uses straight cane) Communication Communication: No difficulties Dominant Hand: Right    Cognition  Cognition Arousal/Alertness: Awake/alert Behavior During Therapy: WFL for tasks assessed/performed Overall Cognitive Status: Within Functional Limits for tasks assessed     Extremity/Trunk Assessment Upper Extremity Assessment Upper Extremity Assessment: Generalized weakness  Lower Extremity Assessment Lower Extremity Assessment: Overall WFL for tasks assessed Cervical / Trunk Assessment Cervical / Trunk Assessment:  (hard cervcial collar)   Balance Balance Balance Assessed: Yes Static Standing Balance Static Standing - Balance Support: No upper extremity supported;During functional activity (washing hands at sink) Static Standing - Level of Assistance: 5: Stand by assistance Static Standing - Comment/# of Minutes: 2 min  End of Session PT - End of Session Equipment Utilized During Treatment: Gait belt;Cervical collar Activity Tolerance: Patient limited by fatigue Patient left: in chair;with call bell/phone within reach;with family/visitor present Nurse Communication: Mobility status (needs for HHPT)  GP     Alec Mcphee Marie 10/18/2013, 10:17 AM  Lewis Shock, PT, DPT Pager #: [phone] Office #: [phone]
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
## 2013-10-18 NOTE — Progress Notes (Signed)
FMTS Attending Daily Note: Lala Been MD [phone] pager office [phone] I have discussed this patient with the resident and reviewed the assessment and plan as documented above. I agree wit the resident's findings and plan.  

## 2013-10-20 NOTE — Progress Notes (Signed)
10/20/13 Contacted Randon Pender with WC, informed her that Dr. Yevette Edwards ordered HHPT for patient but that it was not set up at d/c due to no auth. She requested that I fax the order to [phone] and she will set up HHPT. Faxed HHPT order and received confirmation. Jacquelynn Cree RN, BSN, CCM      16:35 CM called to room as pt informs [facility] DME delivery person that this is all [employer] (WC).  Pt had WC contact name and number in her phone memory but no card; WC rep is Randon Penderton and can be reached at [phone]. Pt understands HH arrangements will have to be made on Monday with the aide of Randon Penderton.  [facility] DME person had pt sign waiver for rolling walker.  Randon Penderton was called by CM and she states she will be waiting for call from CM on Monday to arrange HHPT.  CM will inform Monday staff of [employer]'s [employer] to finalize HHPT.  HHPT cancelled at [facility].   Pt states she still wants [facility] if possible.  Freddy Jaksch, BSN, CM [phone].   10/18/13/14:10  CM spoke with pt's spouse for choice of HHPT (pt was in shower but okd).  [facility] was chosen for HHPT. Rolling Walker to be delivered to room prior to discharge. Address and contact number verified.  Referral faxed to [facility] for HHPT.  No other CM needs were communicated.  Freddy Jaksch, BSN, CM [phone].

## 2013-11-03 ENCOUNTER — Telehealth: Payer: Self-pay | Admitting: Family Medicine

## 2013-11-03 NOTE — Telephone Encounter (Signed)
If she remains stable Dec 11 is fine for follow up,but may come in sooner if otherwise not feeling ok.

## 2013-11-03 NOTE — Telephone Encounter (Signed)
Pt called to let Dr. Lum Babe know that she was discharged from the hospital and made it home. She said that Dr. Lum Babe wants to see her , but there are no openings until 12/11 and wanted to know if that was okay. She would like Dr. Lum Babe call her so that she can talk to her. JW

## 2013-11-03 NOTE — Telephone Encounter (Signed)
Pt informed. Maria Griffith Dawn  

## 2013-11-04 ENCOUNTER — Ambulatory Visit (INDEPENDENT_AMBULATORY_CARE_PROVIDER_SITE_OTHER): Payer: Self-pay | Admitting: General Surgery

## 2013-11-06 ENCOUNTER — Emergency Department (HOSPITAL_COMMUNITY)
Admission: EM | Admit: 2013-11-06 | Discharge: 2013-11-07 | Disposition: A | Discharge status: Home / Self Care | Payer: Worker's Compensation | Attending: Emergency Medicine | Admitting: Emergency Medicine

## 2013-11-06 ENCOUNTER — Emergency Department (HOSPITAL_COMMUNITY): Payer: Worker's Compensation

## 2013-11-06 ENCOUNTER — Encounter (HOSPITAL_COMMUNITY): Payer: Self-pay | Admitting: Emergency Medicine

## 2013-11-06 DIAGNOSIS — F329 Major depressive disorder, single episode, unspecified: Secondary | ICD-10-CM | POA: Insufficient documentation

## 2013-11-06 DIAGNOSIS — G43109 Migraine with aura, not intractable, without status migrainosus: Secondary | ICD-10-CM | POA: Insufficient documentation

## 2013-11-06 DIAGNOSIS — Z8739 Personal history of other diseases of the musculoskeletal system and connective tissue: Secondary | ICD-10-CM | POA: Insufficient documentation

## 2013-11-06 DIAGNOSIS — Z79899 Other long term (current) drug therapy: Secondary | ICD-10-CM | POA: Insufficient documentation

## 2013-11-06 DIAGNOSIS — R109 Unspecified abdominal pain: Secondary | ICD-10-CM | POA: Insufficient documentation

## 2013-11-06 DIAGNOSIS — K5900 Constipation, unspecified: Secondary | ICD-10-CM | POA: Insufficient documentation

## 2013-11-06 DIAGNOSIS — Z8742 Personal history of other diseases of the female genital tract: Secondary | ICD-10-CM | POA: Insufficient documentation

## 2013-11-06 DIAGNOSIS — G4700 Insomnia, unspecified: Secondary | ICD-10-CM | POA: Insufficient documentation

## 2013-11-06 DIAGNOSIS — I10 Essential (primary) hypertension: Secondary | ICD-10-CM | POA: Insufficient documentation

## 2013-11-06 DIAGNOSIS — Z872 Personal history of diseases of the skin and subcutaneous tissue: Secondary | ICD-10-CM | POA: Insufficient documentation

## 2013-11-06 DIAGNOSIS — IMO0002 Reserved for concepts with insufficient information to code with codable children: Secondary | ICD-10-CM | POA: Insufficient documentation

## 2013-11-06 DIAGNOSIS — J45909 Unspecified asthma, uncomplicated: Secondary | ICD-10-CM | POA: Insufficient documentation

## 2013-11-06 DIAGNOSIS — F3289 Other specified depressive episodes: Secondary | ICD-10-CM | POA: Insufficient documentation

## 2013-11-06 DIAGNOSIS — Z87828 Personal history of other (healed) physical injury and trauma: Secondary | ICD-10-CM | POA: Insufficient documentation

## 2013-11-06 DIAGNOSIS — F411 Generalized anxiety disorder: Secondary | ICD-10-CM | POA: Insufficient documentation

## 2013-11-06 LAB — CBC WITH DIFFERENTIAL/PLATELET
Basophils Relative: 1 % (ref 0–1)
Eosinophils Absolute: 0.1 10*3/uL (ref 0.0–0.7)
Lymphocytes Relative: 32 % (ref 12–46)
Lymphs Abs: 1.3 10*3/uL (ref 0.7–4.0)
MCH: 29.5 pg (ref 26.0–34.0)
MCHC: 34.9 g/dL (ref 30.0–36.0)
Neutrophils Relative %: 56 % (ref 43–77)
Platelets: 246 10*3/uL (ref 150–400)
RBC: 3.83 MIL/uL — ABNORMAL LOW (ref 3.87–5.11)

## 2013-11-06 LAB — COMPREHENSIVE METABOLIC PANEL
ALT: 18 U/L (ref 0–35)
Albumin: 3.2 g/dL — ABNORMAL LOW (ref 3.5–5.2)
Alkaline Phosphatase: 42 U/L (ref 39–117)
GFR calc Af Amer: >90 mL/min (ref >90)
Glucose, Bld: 81 mg/dL (ref 70–99)
Potassium: 3.5 mEq/L (ref 3.5–5.1)
Sodium: 136 mEq/L (ref 135–145)
Total Bilirubin: 0.2 mg/dL — ABNORMAL LOW (ref 0.3–1.2)
Total Protein: 6.8 g/dL (ref 6.0–8.3)

## 2013-11-06 MED ORDER — OXYCODONE-ACETAMINOPHEN 5-325 MG PO TABS: 2.0000 | ORAL_TABLET | ORAL | Status: DC | PRN

## 2013-11-06 MED ORDER — DIPHENHYDRAMINE HCL 50 MG/ML IJ SOLN
25.0000 mg | Freq: Once | INTRAMUSCULAR | Status: AC
  Administered 2013-11-06: 25 mg via INTRAVENOUS
  Filled 2013-11-06: qty 1

## 2013-11-06 MED ORDER — HYDROMORPHONE HCL PF 1 MG/ML IJ SOLN
1.0000 mg | Freq: Once | INTRAMUSCULAR | Status: AC
  Administered 2013-11-06: 1 mg via INTRAVENOUS
  Filled 2013-11-06: qty 1

## 2013-11-06 MED ORDER — SODIUM CHLORIDE 0.9 % IV BOLUS (SEPSIS)
1000.0000 mL | Freq: Once | INTRAVENOUS | Status: AC
  Administered 2013-11-06: 1000 mL via INTRAVENOUS

## 2013-11-06 MED ORDER — SODIUM CHLORIDE 0.9 % IV SOLN: INTRAVENOUS | Status: DC

## 2013-11-06 MED ORDER — MORPHINE SULFATE 4 MG/ML IJ SOLN
4.0000 mg | Freq: Once | INTRAMUSCULAR | Status: AC
  Administered 2013-11-06: 4 mg via INTRAVENOUS
  Filled 2013-11-06: qty 1

## 2013-11-06 MED ORDER — LORAZEPAM 2 MG/ML IJ SOLN
1.0000 mg | Freq: Once | INTRAMUSCULAR | Status: AC
  Administered 2013-11-06: 1 mg via INTRAVENOUS
  Filled 2013-11-06: qty 1
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
## 2013-11-06 NOTE — ED Notes (Signed)
Pt reports to the ED via EMS for nausea since the surgery. Pt reports that she has been having some active vomiting and after an episode of emesis. Pt fell forward and hit her head on the mattress. No LOC. Pt reports posterior head pain and neck pain. Pt has had a C-collar in place from recent cervical diskectomy and fusion 3 weeks ago. Pt took some of her prescribed Valium and Hydrocodone before she left but reports that the medications are not helping significantly. No numbness, tingling, paralysis, or bowel or bladder changes. Pt A&O x4. Resp e/u. Skin warm and dry.

## 2013-11-07 ENCOUNTER — Emergency Department (HOSPITAL_COMMUNITY): Payer: Worker's Compensation

## 2013-11-07 ENCOUNTER — Telehealth: Payer: Self-pay | Admitting: Family Medicine

## 2013-11-07 NOTE — ED Notes (Signed)
Patient transported to Ultrasound 

## 2013-11-07 NOTE — Telephone Encounter (Signed)
I discussed her abdominal U/S report from yesterday 11/06/13 with patient with Liver lesion and need for MRI. Patient to schedule f/u to further discuss. Liver enzyme normal.

## 2013-11-19 ENCOUNTER — Encounter (INDEPENDENT_AMBULATORY_CARE_PROVIDER_SITE_OTHER): Payer: Self-pay | Admitting: General Surgery

## 2013-11-19 ENCOUNTER — Encounter (INDEPENDENT_AMBULATORY_CARE_PROVIDER_SITE_OTHER): Payer: Self-pay

## 2013-11-19 ENCOUNTER — Ambulatory Visit (INDEPENDENT_AMBULATORY_CARE_PROVIDER_SITE_OTHER): Payer: Worker's Compensation | Admitting: General Surgery

## 2013-11-19 VITALS — BP 132/80 | HR 74 | Temp 98.8°F | Resp 18 | Ht 64.0 in | Wt 154.0 lb

## 2013-11-19 DIAGNOSIS — K649 Unspecified hemorrhoids: Secondary | ICD-10-CM

## 2013-11-19 NOTE — Progress Notes (Signed)
Subjective:     Patient ID: Maria Griffith, female   DOB: 10/28/1961, 52 y.o.   MRN: [id number]  HPI The patient is a 52 year old female was referred by Dr. Lum Babe for evaluation of hemorrhoids. The patient underwent a cervical with the procedure several weeks ago and has been on hydrocodone. She states it started hydrocodone become more constipated. Because the patient's extreme will having bowel movements and hemorrhoids.  The patient was started on Anusol cream. She's also been sitting in a warm tub water. She states that this has helped. She does take a MiraLax which is currently less constipated. She's taken this to 3 times a day.  Review of Systems  Constitutional: Negative.   HENT: Negative.   Respiratory: Negative.   Cardiovascular: Negative.   Gastrointestinal: Negative.   Neurological: Negative.   All other systems reviewed and are negative.       Objective:   Physical Exam  Constitutional: She is oriented to person, place, and time. She appears well-developed and well-nourished.  HENT:  Head: Normocephalic and atraumatic.  Eyes: Conjunctivae and EOM are normal. Pupils are equal, round, and reactive to light.  Neck: Normal range of motion. Neck supple.  Cardiovascular: Normal rate, regular rhythm and normal heart sounds.   Pulmonary/Chest: Effort normal and breath sounds normal.  Abdominal: Soft. Bowel sounds are normal.  Genitourinary:     Musculoskeletal: Normal range of motion.  Neurological: She is alert and oriented to person, place, and time.  Skin: Skin is warm and dry.       Assessment:     52 year old female with resolving hemorrhoids.     Plan:     1. I recommend continue with MiraLax 2-3 times a day. After being off I. Recommend continue with fiber supplementation, Metamucil 2 times a day. Also recommend staying hydrated. 2. Patient to followup as needed

## 2013-11-27 ENCOUNTER — Encounter: Payer: Self-pay | Admitting: Family Medicine

## 2013-11-27 ENCOUNTER — Ambulatory Visit (INDEPENDENT_AMBULATORY_CARE_PROVIDER_SITE_OTHER): Payer: PRIVATE HEALTH INSURANCE | Admitting: Family Medicine

## 2013-11-27 VITALS — BP 137/86 | HR 83 | Temp 98.6°F | Ht 64.0 in | Wt 156.0 lb

## 2013-11-27 DIAGNOSIS — R141 Gas pain: Secondary | ICD-10-CM

## 2013-11-27 DIAGNOSIS — R142 Eructation: Secondary | ICD-10-CM

## 2013-11-27 DIAGNOSIS — Z0000 Encounter for general adult medical examination without abnormal findings: Secondary | ICD-10-CM

## 2013-11-27 DIAGNOSIS — M4722 Other spondylosis with radiculopathy, cervical region: Secondary | ICD-10-CM

## 2013-11-27 DIAGNOSIS — K649 Unspecified hemorrhoids: Secondary | ICD-10-CM

## 2013-11-27 DIAGNOSIS — K7689 Other specified diseases of liver: Secondary | ICD-10-CM

## 2013-11-27 DIAGNOSIS — K769 Liver disease, unspecified: Secondary | ICD-10-CM

## 2013-11-27 DIAGNOSIS — M4712 Other spondylosis with myelopathy, cervical region: Secondary | ICD-10-CM

## 2013-11-27 DIAGNOSIS — J069 Acute upper respiratory infection, unspecified: Secondary | ICD-10-CM

## 2013-11-27 DIAGNOSIS — I10 Essential (primary) hypertension: Secondary | ICD-10-CM
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
## 2013-11-27 NOTE — Patient Instructions (Addendum)
  Maria Griffith, it was nice seeing you today, i am however sorry about your burping. elching or burping (eructation) is the voluntary or involuntary, sometimes noisy release of air from the stomach camera.gif or esophagus camera.gif through the mouth. Burping 3 or 4 times after eating a meal is normal and is usually caused by swallowing air. Other causes of burping include nervous habits or other medical conditions, such as an ulcer or a gallbladder problem.If this persist you might need further evaluation. i will reassess you at next visit.  Colonoscopy A colonoscopy is an exam to look at your colon. This exam helps to find lumps (tumors), growths (polyps), puffiness (swelling), and bleeding in your colon.  BEFORE THE PROCEDURE  You may need to drink clear liquids for 2 days before the exam.  Ask your doctor about changing or stopping your regular medicines.  You may need liquid or medicines put in your butt (enema or laxatives) to help you poop.  You may need to drink a liquid over a short amount of time. This liquid cleans your colon.  Ask your doctor what time you need to arrive. PROCEDURE A tube is put in the opening of your butt (anus) and into your colon. The doctor will look for anything that is not normal. Your doctor may take a tissue sample (biopsy) from your colon to be looked at more closely. AFTER THE PROCEDURE  Have someone drive you home if you took pain medicine or a medicine to relax you (sedative).  You may see blood in your poop (stool). This is normal.  You may pass gas and have belly (abdominal) cramps. This is normal. Finding out the results of your test Ask when your test results will be ready. Make sure you get your test results. Document Released: 01/06/2011 Document Revised: 03/31/2013 Document Reviewed: 01/06/2011 [facility] Patient Information 2014 [place], [place].

## 2013-11-28 DIAGNOSIS — R142 Eructation: Secondary | ICD-10-CM | POA: Insufficient documentation

## 2013-11-28 DIAGNOSIS — Z01419 Encounter for gynecological examination (general) (routine) without abnormal findings: Secondary | ICD-10-CM | POA: Insufficient documentation

## 2013-11-28 DIAGNOSIS — J069 Acute upper respiratory infection, unspecified: Secondary | ICD-10-CM | POA: Insufficient documentation

## 2013-11-28 DIAGNOSIS — K769 Liver disease, unspecified: Secondary | ICD-10-CM | POA: Insufficient documentation

## 2013-11-28 NOTE — Assessment & Plan Note (Signed)
Compliant with PT. Seem to have improved just a little from last visit. Continue home. She has Orthopedic f/u Dec 18th. Defer further management to orthopedic.

## 2013-11-28 NOTE — Assessment & Plan Note (Signed)
No acute symptoms related to her hemorrhoid. Seem to have improved a lot.  I reviewed her gastroenterologist's consult report. I agree with conservative management for now.

## 2013-11-28 NOTE — Assessment & Plan Note (Signed)
Likely viral infection. Patient reassured this should resolve in few days. Advised to call or return to clinic soon if symptom persist or worsens. May use OTC cough syrup prn in the interim.

## 2013-11-28 NOTE — Assessment & Plan Note (Signed)
No documentation of flu shot in the record. Patient does not seem to be sure she got vaccination. She will return next week for vaccination. Order placed. She plans to schedule mammogram as soon as she can tolerate it depending on her neck pain improvement. Cannot obtain colonoscopy due to insurance problem. Stool card given for hemoccult testing.

## 2013-11-28 NOTE — Assessment & Plan Note (Signed)
U/S report reviewed and discussed with patient. Likely hemangioma but warrants further imaging if abnormal liver enzymes or hx of cancer which were both negative. Not clear if this is related to her burping but unlikely. MRI with Liver protocol ordered for further assessment.

## 2013-11-28 NOTE — Assessment & Plan Note (Signed)
Patient burped few times during this visit. Etiology unknown. I reviewed all medication,it does not seem burping is related to her medications. Possible excessive swallowing of air. Not sure how this could be related to her cervical surgery. She plans to discuss this with her orthopedic to check if there is a relationship between surgery and her burping since it started after surgery. If symptom persist might need GI referral.

## 2013-11-28 NOTE — Assessment & Plan Note (Signed)
Her BP looks good today. Continue current BP regimen.

## 2013-12-01 LAB — POC HEMOCCULT BLD/STL (HOME/3-CARD/SCREEN)
Card #2 Fecal Occult Blod, POC: NEGATIVE
Card #3 Fecal Occult Blood, POC: NEGATIVE
Fecal Occult Blood, POC: NEGATIVE

## 2013-12-01 NOTE — Addendum Note (Signed)
Addended by: [place], Caylen Yardley on: 12/01/2013 05:11 PM   Modules accepted: Orders

## 2013-12-02 ENCOUNTER — Telehealth: Payer: Self-pay | Admitting: *Deleted

## 2013-12-02 NOTE — Telephone Encounter (Signed)
Maria Griffith called and left a message stating she is a pt. Of Dr. Burnice Logan and she is almost out of birth control medicine. Called [facility] and notified her she has 11 refills left at [facility]- she just has to call them and tell them she needs next bottle. Tijauana also asked if she was supposed to take them only when bleeding or all the time . I explained she should take one every day at about the same time and once she is doing that her bleeding will be under better control. Also reinforced she is to keep her appointment with Dr. Burnice Logan now that her spine surgery is over. Valerye voices understanding.

## 2013-12-08 ENCOUNTER — Telehealth: Payer: Self-pay | Admitting: Family Medicine

## 2013-12-08 NOTE — Telephone Encounter (Signed)
Pt needs trazodone refilled

## 2013-12-08 NOTE — Telephone Encounter (Signed)
Pharmacy is [facility] [phone] Please let pt know when it is called in She has trouble getting out

## 2013-12-09 ENCOUNTER — Other Ambulatory Visit: Payer: Self-pay | Admitting: Family Medicine

## 2013-12-09 MED ORDER — TRAZODONE HCL 100 MG PO TABS: 100.0000 mg | ORAL_TABLET | Freq: Every day | ORAL | Status: DC

## 2013-12-09 NOTE — Telephone Encounter (Signed)
Pt is aware.  Maria Griffith,CMA  

## 2013-12-09 NOTE — Telephone Encounter (Signed)
Medication has been sent to pharmacy,please let patient know.

## 2013-12-16 ENCOUNTER — Ambulatory Visit
Admission: RE | Admit: 2013-12-16 | Discharge: 2013-12-16 | Disposition: A | Discharge status: Home / Self Care | Payer: No Typology Code available for payment source | Source: Ambulatory Visit | Attending: Family Medicine | Admitting: Family Medicine

## 2013-12-16 ENCOUNTER — Other Ambulatory Visit: Payer: Self-pay | Admitting: Family Medicine

## 2013-12-16 DIAGNOSIS — K769 Liver disease, unspecified: Secondary | ICD-10-CM

## 2013-12-16 MED ORDER — GADOBENATE DIMEGLUMINE 529 MG/ML IV SOLN
14.0000 mL | Freq: Once | INTRAVENOUS | Status: AC | PRN
  Administered 2013-12-16: 14 mL via INTRAVENOUS

## 2013-12-16 MED ORDER — SERTRALINE HCL 100 MG PO TABS: 100.0000 mg | ORAL_TABLET | Freq: Every day | ORAL | Status: DC

## 2013-12-23 ENCOUNTER — Telehealth: Payer: Self-pay | Admitting: Family Medicine

## 2013-12-23 NOTE — Telephone Encounter (Signed)
Pt called and would like someone to call her with her recent radiology visit. jw

## 2013-12-23 NOTE — Telephone Encounter (Signed)
Spoke with patient about MRI liver small + hemangioma, all question answered.

## 2013-12-23 NOTE — Telephone Encounter (Signed)
Result of MRI has been discussed with patient.

## 2013-12-24 ENCOUNTER — Ambulatory Visit (INDEPENDENT_AMBULATORY_CARE_PROVIDER_SITE_OTHER): Payer: PRIVATE HEALTH INSURANCE | Admitting: Family Medicine

## 2013-12-24 ENCOUNTER — Encounter: Payer: Self-pay | Admitting: Family Medicine

## 2013-12-24 VITALS — BP 150/88 | HR 80 | Temp 99.1°F | Ht 64.0 in | Wt 152.0 lb

## 2013-12-24 DIAGNOSIS — R112 Nausea with vomiting, unspecified: Secondary | ICD-10-CM

## 2013-12-24 MED ORDER — POLYETHYLENE GLYCOL 3350 17 G PO PACK: 17.0000 g | PACK | Freq: Every day | ORAL | Status: DC

## 2013-12-24 MED ORDER — BISACODYL 10 MG RE SUPP: 10.0000 mg | Freq: Once | RECTAL | Status: DC

## 2013-12-24 NOTE — Patient Instructions (Addendum)
Nice to meet you. I'm sorry you have been having such trouble since your surgery. I am unsure of the cause of your nausea, vomiting, and diarrhea. This may be related to your surgery as they operated on the anterior portion of your neck and there is potential for complications in that portion of your neck, though it may be related to a different issue. We feel that a referral to GI is necessary for further evaluation of this issue. I will treat your constipation with miralax and dulcolax suppository. Please take the miralax for the next 5 days or until you have a normal BM daily. If you become severely constipated again, please take the dulcolax suppository. If your nausea, vomiting, or if you develop abdominal pain please return to care.

## 2013-12-25 ENCOUNTER — Other Ambulatory Visit: Payer: Self-pay

## 2013-12-25 DIAGNOSIS — Z1231 Encounter for screening mammogram for malignant neoplasm of breast: Secondary | ICD-10-CM

## 2013-12-27 NOTE — Assessment & Plan Note (Signed)
Patient presents with continued nausea, vomiting, and sensation of food getting stuck in her throat. Patient states these symptoms have been present since she had an anterior cervical fusion in October. None of the treatments tried since that time have been beneficial for this issue. Concern would be that some aspect of the surgical procedure contributed to the onset of these symptoms, though the symptoms could be coincidental as well and be due to an unrelated cause. Potential other causes could be esophageal stricture or gastroparesis or there could be a psychological component. Will need to refer to GI for further evaluation of this continued issue. I discussed the potential causes with the patients workers Designer, multimedia and with the patient and her husband. They all voiced understanding of the potential causes and the need for GI evaluation.
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
## 2014-01-06 ENCOUNTER — Ambulatory Visit (INDEPENDENT_AMBULATORY_CARE_PROVIDER_SITE_OTHER): Payer: PRIVATE HEALTH INSURANCE | Admitting: Family Medicine

## 2014-01-06 ENCOUNTER — Encounter: Payer: Self-pay | Admitting: Family Medicine

## 2014-01-06 VITALS — BP 145/90 | HR 80 | Temp 98.7°F | Wt 149.0 lb

## 2014-01-06 DIAGNOSIS — M4712 Other spondylosis with myelopathy, cervical region: Secondary | ICD-10-CM

## 2014-01-06 DIAGNOSIS — M4722 Other spondylosis with radiculopathy, cervical region: Secondary | ICD-10-CM

## 2014-01-06 DIAGNOSIS — R142 Eructation: Secondary | ICD-10-CM

## 2014-01-06 DIAGNOSIS — R141 Gas pain: Secondary | ICD-10-CM

## 2014-01-06 DIAGNOSIS — R143 Flatulence: Secondary | ICD-10-CM

## 2014-01-06 DIAGNOSIS — I10 Essential (primary) hypertension: Secondary | ICD-10-CM

## 2014-01-06 DIAGNOSIS — R112 Nausea with vomiting, unspecified: Secondary | ICD-10-CM

## 2014-01-06 MED ORDER — FAMOTIDINE 20 MG PO TABS: 40.0000 mg | ORAL_TABLET | Freq: Two times a day (BID) | ORAL | Status: DC

## 2014-01-06 MED ORDER — HYDROXYZINE HCL 25 MG PO TABS: 25.0000 mg | ORAL_TABLET | Freq: Four times a day (QID) | ORAL | Status: DC | PRN

## 2014-01-06 NOTE — Assessment & Plan Note (Signed)
BP slightly elevated,may be due to pain. No adjustment made to her medication at this time.

## 2014-01-06 NOTE — Assessment & Plan Note (Signed)
Etiology unclear. Uncertain if related to her surgery. Plan referral to GI, referral done at last visit,but appointment is yet to be scheduled due to workers comp scheduling. She will follow up with scheduling her GI appointment.

## 2014-01-06 NOTE — Assessment & Plan Note (Signed)
Neck pain persist but no acute change in symptom. Continue home PT. Continue F/U with ortho and pain medication per orthopedic.

## 2014-01-06 NOTE — Assessment & Plan Note (Addendum)
Patient endorse allergy to Zofran. Plan to d/c Zofran and start Hydroxyzine. Pepcid for reflux.

## 2014-01-06 NOTE — Patient Instructions (Signed)
Gastroesophageal Reflux Disease, Adult  Gastroesophageal reflux disease (GERD) happens when acid from your stomach goes into your food pipe (esophagus). The acid can cause a burning feeling in your chest. Over time, the acid can make small holes (ulcers) in your food pipe.   HOME CARE  · Ask your doctor for advice about:  · Losing weight.  · Quitting smoking.  · Alcohol use.  · Avoid foods and drinks that make your problems worse. You may want to avoid:  · Caffeine and alcohol.  · Chocolate.  · Mints.  · Garlic and onions.  · Spicy foods.  · Citrus fruits, such as oranges, lemons, or limes.  · Foods that contain tomato, such as sauce, chili, salsa, and pizza.  · Fried and fatty foods.  · Avoid lying down for 3 hours before you go to bed or before you take a nap.  · Eat small meals often, instead of large meals.  · Wear loose-fitting clothing. Do not wear anything tight around your waist.  · Raise (elevate) the head of your bed 6 to 8 inches with wood blocks. Using extra pillows does not help.  · Only take medicines as told by your doctor.  · Do not take aspirin or ibuprofen.  GET HELP RIGHT AWAY IF:   · You have pain in your arms, neck, jaw, teeth, or back.  · Your pain gets worse or changes.  · You feel sick to your stomach (nauseous), throw up (vomit), or sweat (diaphoresis).  · You feel short of breath, or you pass out (faint).  · Your throw up is green, yellow, black, or looks like coffee grounds or blood.  · Your poop (stool) is red, bloody, or black.  MAKE SURE YOU:   · Understand these instructions.  · Will watch your condition.  · Will get help right away if you are not doing well or get worse.  Document Released: 05/22/2008 Document Revised: 02/26/2012 Document Reviewed: 06/23/2011  [employer]® Patient Information ©2014 [employer].
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
## 2014-01-09 ENCOUNTER — Ambulatory Visit: Payer: Self-pay | Admitting: Cardiovascular Disease

## 2014-01-12 ENCOUNTER — Ambulatory Visit: Payer: PRIVATE HEALTH INSURANCE

## 2014-01-14 ENCOUNTER — Other Ambulatory Visit: Payer: Self-pay

## 2014-01-14 ENCOUNTER — Ambulatory Visit
Admission: RE | Admit: 2014-01-14 | Discharge: 2014-01-14 | Disposition: A | Discharge status: Home / Self Care | Payer: PRIVATE HEALTH INSURANCE | Source: Ambulatory Visit

## 2014-01-14 DIAGNOSIS — Z1231 Encounter for screening mammogram for malignant neoplasm of breast: Secondary | ICD-10-CM

## 2014-01-16 ENCOUNTER — Ambulatory Visit (INDEPENDENT_AMBULATORY_CARE_PROVIDER_SITE_OTHER): Payer: No Typology Code available for payment source | Admitting: Obstetrics & Gynecology

## 2014-01-16 ENCOUNTER — Encounter: Payer: Self-pay | Admitting: Obstetrics & Gynecology

## 2014-01-16 VITALS — BP 139/91 | HR 76 | Temp 99.2°F | Ht 64.0 in | Wt 149.4 lb

## 2014-01-16 DIAGNOSIS — D259 Leiomyoma of uterus, unspecified: Secondary | ICD-10-CM

## 2014-01-16 DIAGNOSIS — D219 Benign neoplasm of connective and other soft tissue, unspecified: Secondary | ICD-10-CM

## 2014-01-16 MED ORDER — NORGESTIMATE-ETH ESTRADIOL 0.25-35 MG-MCG PO TABS: 1.0000 | ORAL_TABLET | Freq: Every day | ORAL | Status: DC

## 2014-01-16 NOTE — Patient Instructions (Addendum)
Uterine Fibroid A uterine fibroid is a growth (tumor) that occurs in your uterus. This type of tumor is not cancerous and does not spread out of the uterus. You can have one or many fibroids. Fibroids can vary in size, weight, and where they grow in the uterus. Some can become quite large. Most fibroids do not require medical treatment, but some can cause pain or heavy bleeding during and between periods. CAUSES  A fibroid is the result of a single uterine cell that keeps growing (unregulated), which is different than most cells in the human body. Most cells have a control mechanism that keeps them from reproducing without control.  SIGNS AND SYMPTOMS   Bleeding.  Pelvic pain and pressure.  Bladder problems due to the size of the fibroid.  Infertility and miscarriages depending on the size and location of the fibroid. DIAGNOSIS  Uterine fibroids are diagnosed through a physical exam. Your health care provider may feel the lumpy tumors during a pelvic exam. Ultrasonography may be done to get information regarding size, location, and number of tumors.  TREATMENT   Your health care provider may recommend watchful waiting. This involves getting the fibroid checked by your health care provider to see if it grows or shrinks.   Hormone treatment or an intrauterine device (IUD) may be prescribed.   Surgery may be needed to remove the fibroids (myomectomy) or the uterus (hysterectomy). This depends on your situation. When fibroids interfere with fertility and a woman wants to become pregnant, a health care provider may recommend having the fibroids removed.  [place] care depends on how you were treated. In general:   Keep all follow-up appointments with your health care provider.   Only take over-the-counter or prescription medicines as directed by your health care provider. If you were prescribed a hormone treatment, take the hormone medicines exactly as directed. Do not  take aspirin. It can cause bleeding.   Talk to your health care provider about taking iron pills.  If your periods are troublesome but not so heavy, lie down with your feet raised slightly above your heart. Place cold packs on your lower abdomen.   If your periods are heavy, write down the number of pads or tampons you use per month. Bring this information to your health care provider.   Include green vegetables in your diet.  SEEK IMMEDIATE MEDICAL CARE IF:  You have pelvic pain or cramps not controlled with medicines.   You have a sudden increase in pelvic pain.   You have an increase in bleeding between and during periods.   You have excessive periods and soak tampons or pads in a half hour or less.  You feel lightheaded or have fainting episodes. Document Released: 12/01/2000 Document Revised: 09/24/2013 Document Reviewed: 07/03/2013 [facility] Patient Information 2014 [place], [place].   07/18/2013 TRANSABDOMINAL AND TRANSVAGINAL ULTRASOUND OF PELVIS  Technique: Both transabdominal and transvaginal ultrasound  examinations of the pelvis were performed. Transabdominal technique  was performed for global imaging of the pelvis including uterus,  ovaries, adnexal regions, and pelvic cul-de-sac.  It was necessary to proceed with endovaginal exam following the  transabdominal exam to visualize the myometrium, endometrium and  adnexa.  Comparison: None  Findings: Overall evaluation is compromised by poor resolution on  the transabdominal exam due to poor bladder filling and patient  habitus combined with poor visibility on endovaginal exam due to  uterine size.  Uterus: Is anteverted and anteflexed and is best assessed  transabdominally  due to uterine size. The uterus demonstrates a  sagittal length of approximately 13 cm, depth of approximately 8 cm  and width of approximately 9 cm. There is a dominant fibroid  identified in the central posterior portion of the uterus  measuring  5.5 x 4.8 x 4.6 cm with best measurements obtained  transabdominally. More diffuse fibroid involvement is felt likely.  Endometrium: Cannot be seen with confidence either transabdominally  or endovaginally due to the presence of the fibroid load  Right ovary: Is not seen with confidence either transabdominally  or endovaginally  Left ovary: Has a normal appearance endovaginally measuring 3.5 x  2.1 x 2.7 cm  Other findings: A trace of simple free fluid is noted adjacent to  the left ovary.  IMPRESSION:  Enlarged uterus with one dominant measurable fibroid and more  diffuse fibroid involvement felt likely. The central location of  the dominant fibroid makes submucosal involvement possible with  poor endometrial evaluation possible today. If more complete  evaluation of the relationship between the endometrium and fibroid  is desired, pelvic MRI would be recommended.  Normal left ovary. Non-visualized right ovary.  Menopause Menopause is the normal time of life when menstrual periods stop completely. Menopause is complete when you have missed 12 consecutive menstrual periods. It usually occurs between the ages of 63 years and 89 years. Very rarely does a woman develop menopause before the age of 45 years. At menopause, your ovaries stop producing the female hormones estrogen and progesterone. This can cause undesirable symptoms and also affect your health. Sometimes the symptoms may occur 4 5 years before the menopause begins. There is no relationship between menopause and:  Oral contraceptives.  Number of children you had.  Race.  The age your menstrual periods started (menarche). Heavy smokers and very thin women may develop menopause earlier in life. CAUSES  The ovaries stop producing the female hormones estrogen and progesterone.  Other causes include:  Surgery to remove both ovaries.  The ovaries stop functioning for no known reason.  Tumors of the pituitary  gland in the brain.  Medical disease that affects the ovaries and hormone production.  Radiation treatment to the abdomen or pelvis.  Chemotherapy that affects the ovaries. SYMPTOMS   Hot flashes.  Night sweats.  Decrease in sex drive.  Vaginal dryness and thinning of the vagina causing painful intercourse.  Dryness of the skin and developing wrinkles.  Headaches.  Tiredness.  Irritability.  Memory problems.  Weight gain.  Bladder infections.  Hair growth of the face and chest.  Infertility. More serious symptoms include:  Loss of bone (osteoporosis) causing breaks (fractures).  Depression.  Hardening and narrowing of the arteries (atherosclerosis) causing heart attacks and strokes. DIAGNOSIS   When the menstrual periods have stopped for 12 straight months.  Physical exam.  Hormone studies of the blood. TREATMENT  There are many treatment choices and nearly as many questions about them. The decisions to treat or not to treat menopausal changes is an individual choice made with your health care provider. Your health care provider can discuss the treatments with you. Together, you can decide which treatment will work best for you. Your treatment choices may include:   Hormone therapy (estrogen and progesterone).  Non-hormonal medicines.  Treating the individual symptoms with medicine (for example antidepressants for depression).  Herbal medicines that may help specific symptoms.  Counseling by a psychiatrist or psychologist.  Group therapy.  Lifestyle changes including:  Eating healthy.  Regular exercise.  Limiting  caffeine and alcohol.  Stress management and meditation.  No treatment. HOME CARE INSTRUCTIONS   Take the medicine your health care provider gives you as directed.  Get plenty of sleep and rest.  Exercise regularly.  Eat a diet that contains calcium (good for the bones) and soy products (acts like estrogen hormone).  Avoid  alcoholic beverages.  Do not smoke.  If you have hot flashes, dress in layers.  Take supplements, calcium, and vitamin D to strengthen bones.  You can use over-the-counter lubricants or moisturizers for vaginal dryness.  Group therapy is sometimes very helpful.  Acupuncture may be helpful in some cases. SEEK MEDICAL CARE IF:   You are not sure you are in menopause.  You are having menopausal symptoms and need advice and treatment.  You are still having menstrual periods after age 49 years.  You have pain with intercourse.  Menopause is complete (no menstrual period for 12 months) and you develop vaginal bleeding.  You need a referral to a specialist (gynecologist, psychiatrist, or psychologist) for treatment. SEEK IMMEDIATE MEDICAL CARE IF:   You have severe depression.  You have excessive vaginal bleeding.  You fell and think you have a broken bone.  You have pain when you urinate.  You develop leg or chest pain.  You have a fast pounding heart beat (palpitations).  You have severe headaches.  You develop vision problems.  You feel a lump in your breast.  You have abdominal pain or severe indigestion. Document Released: 02/24/2004 Document Revised: 08/06/2013 Document Reviewed: 07/03/2013 [facility] Patient Information 2014 [place], [place]. Perimenopause Perimenopause is the time when your body begins to move into the menopause (no menstrual period for 12 straight months). It is a natural process. Perimenopause can begin 2 8 years before the menopause and usually lasts for 1 year after the menopause. During this time, your ovaries may or may not produce an egg. The ovaries vary in their production of estrogen and progesterone hormones each month. This can cause irregular menstrual periods, difficulty getting pregnant, vaginal bleeding between periods, and uncomfortable symptoms. CAUSES  Irregular production of the ovarian hormones, estrogen and progesterone, and  not ovulating every month.  Other causes include:  Tumor of the pituitary gland in the brain.  Medical disease that affects the ovaries.  Radiation treatment.  Chemotherapy.  Unknown causes.  Heavy smoking and excessive alcohol intake can bring on perimenopause sooner. SIGNS AND SYMPTOMS   Hot flashes.  Night sweats.  Irregular menstrual periods.  Decreased sex drive.  Vaginal dryness.  Headaches.  Mood swings.  Depression.  Memory problems.  Irritability.  Tiredness.  Weight gain.  Trouble getting pregnant.  The beginning of losing bone cells (osteoporosis).  The beginning of hardening of the arteries (atherosclerosis). DIAGNOSIS  Your health care provider will make a diagnosis by analyzing your age, menstrual history, and symptoms. He or she will do a physical exam and note any changes in your body, especially your female organs. Female hormone tests may or may not be helpful depending on the amount of female hormones you produce and when you produce them. However, other hormone tests may be helpful to rule out other problems. TREATMENT  In some cases, no treatment is needed. The decision on whether treatment is necessary during the perimenopause should be made by you and your health care provider based on how the symptoms are affecting you and your lifestyle. Various treatments are available, such as:  Treating individual symptoms with a specific medicine for that  symptom.  Herbal medicines that can help specific symptoms.  Counseling.  Group therapy. HOME CARE INSTRUCTIONS   Keep track of your menstrual periods (when they occur, how heavy they are, how long between periods, and how long they last) as well as your symptoms and when they started.  Only take over-the-counter or prescription medicines as directed by your health care provider.  Sleep and rest.  Exercise.  Eat a diet that contains calcium (good for your bones) and soy (acts like the  estrogen hormone).  Do not smoke.  Avoid alcoholic beverages.  Take vitamin supplements as recommended by your health care provider. Taking vitamin E may help in certain cases.  Take calcium and vitamin D supplements to help prevent bone loss.  Group therapy is sometimes helpful.  Acupuncture may help in some cases. SEEK MEDICAL CARE IF:   You have questions about any symptoms you are having.  You need a referral to a specialist (gynecologist, psychiatrist, or psychologist). SEEK IMMEDIATE MEDICAL CARE IF:   You have vaginal bleeding.  Your period lasts longer than 8 days.  Your periods are recurring sooner than 21 days.  You have bleeding after intercourse.  You have severe depression.  You have pain when you urinate.  You have severe headaches.  You have vision problems. Document Released: 01/11/2005 Document Revised: 09/24/2013 Document Reviewed: 07/03/2013 [facility] Patient Information 2014 [place], [place].

## 2014-01-19 ENCOUNTER — Ambulatory Visit: Payer: Self-pay

## 2014-01-19 NOTE — Progress Notes (Signed)
Subjective:     Patient ID: Maria Griffith, female   DOB: [birth year], 53 y.o.   MRN: [id number]  HPI Pt with multiple medical problems.  She recently had cervical and thoracic spine surgery and is here to f/u for management of her uterine fibroids.  She reports that her bleeding is controlled as long as she is on the OCP's.  She reports some reflux and abd bloating but, is scheduled to see a GI physician very soon.     Review of Systems no new GYN complaints     Objective:   Physical Exam BP 139/91  Pulse 76  Temp(Src) 99.2 F (37.3 C)  Ht 5\' 4"  (1.626 m)  Wt 149 lb 6.4 oz (67.767 kg)  BMI 25.63 kg/m2  LMP 01/15/2014 Pt in NAD  Exam deferred 07/18/2013 Clinical Data: Normal uterine bleeding with pain. LMP July 2014  and post myomectomy  TRANSABDOMINAL AND TRANSVAGINAL ULTRASOUND OF PELVIS  Technique: Both transabdominal and transvaginal ultrasound  examinations of the pelvis were performed. Transabdominal technique  was performed for global imaging of the pelvis including uterus,  ovaries, adnexal regions, and pelvic cul-de-sac.  It was necessary to proceed with endovaginal exam following the  transabdominal exam to visualize the myometrium, endometrium and  adnexa.  Comparison: None  Findings: Overall evaluation is compromised by poor resolution on  the transabdominal exam due to poor bladder filling and patient  habitus combined with poor visibility on endovaginal exam due to  uterine size.  Uterus: Is anteverted and anteflexed and is best assessed  transabdominally due to uterine size. The uterus demonstrates a  sagittal length of approximately 13 cm, depth of approximately 8 cm  and width of approximately 9 cm. There is a dominant fibroid  identified in the central posterior portion of the uterus measuring  5.5 x 4.8 x 4.6 cm with best measurements obtained  transabdominally. More diffuse fibroid involvement is felt likely.  Endometrium: Cannot be seen with confidence  either transabdominally  or endovaginally due to the presence of the fibroid load  Right ovary: Is not seen with confidence either transabdominally  or endovaginally  Left ovary: Has a normal appearance endovaginally measuring 3.5 x  2.1 x 2.7 cm  Other findings: A trace of simple free fluid is noted adjacent to  the left ovary.  IMPRESSION:  Enlarged uterus with one dominant measurable fibroid and more  diffuse fibroid involvement felt likely. The central location of  the dominant fibroid makes submucosal involvement possible with  poor endometrial evaluation possible today. If more complete  evaluation of the relationship between the endometrium and fibroid  is desired, pelvic MRI would be recommended.  Normal left ovary. Non-visualized right ovary.  09/04/2013 Diagnosis Endometrium, biopsy - ABUNDANT BLOOD, SCANT INACTIVE ENDOMETRIUM AND SCANT BENIGN ENDOCERVIX. SEE MICROSCOPIC DESCRIPTION.    Assessment:     H/o irreg menses suspect due to fibroids.  Sx improved with  OCP's.  D/w pt that will not need surgical intervention if her sx are controlled with conservative methods.  She concurs.     Plan:     sprintec 1 po q day F/u 8 months for annual or sooner prn

## 2014-01-21 ENCOUNTER — Other Ambulatory Visit: Payer: Self-pay | Admitting: Gastroenterology

## 2014-01-21 DIAGNOSIS — R1310 Dysphagia, unspecified: Secondary | ICD-10-CM

## 2014-01-23 ENCOUNTER — Ambulatory Visit: Payer: PRIVATE HEALTH INSURANCE | Admitting: Cardiovascular Disease

## 2014-01-26 ENCOUNTER — Ambulatory Visit
Admission: RE | Admit: 2014-01-26 | Discharge: 2014-01-26 | Disposition: A | Discharge status: Home / Self Care | Payer: Worker's Compensation | Source: Ambulatory Visit | Attending: Gastroenterology | Admitting: Gastroenterology

## 2014-01-26 DIAGNOSIS — R1310 Dysphagia, unspecified: Secondary | ICD-10-CM

## 2014-01-29 ENCOUNTER — Other Ambulatory Visit: Payer: Self-pay | Admitting: Family Medicine

## 2014-02-03 ENCOUNTER — Ambulatory Visit: Payer: Self-pay | Admitting: Family Medicine

## 2014-02-13 ENCOUNTER — Encounter: Payer: Self-pay | Admitting: Cardiovascular Disease

## 2014-02-13 NOTE — Progress Notes (Signed)
appt cancelled

## 2014-03-03 ENCOUNTER — Other Ambulatory Visit: Payer: Self-pay | Admitting: *Deleted

## 2014-03-03 MED ORDER — QUINAPRIL-HYDROCHLOROTHIAZIDE 20-12.5 MG PO TABS: 1.0000 | ORAL_TABLET | Freq: Every day | ORAL | Status: DC

## 2014-03-06 ENCOUNTER — Other Ambulatory Visit: Payer: Self-pay | Admitting: *Deleted

## 2014-03-06 MED ORDER — QUINAPRIL-HYDROCHLOROTHIAZIDE 20-12.5 MG PO TABS: 1.0000 | ORAL_TABLET | Freq: Every day | ORAL | Status: DC

## 2014-03-18 ENCOUNTER — Telehealth: Payer: Self-pay | Admitting: *Deleted

## 2014-03-18 ENCOUNTER — Other Ambulatory Visit: Payer: Self-pay | Admitting: *Deleted

## 2014-03-18 DIAGNOSIS — Z01812 Encounter for preprocedural laboratory examination: Secondary | ICD-10-CM

## 2014-03-18 DIAGNOSIS — N920 Excessive and frequent menstruation with regular cycle: Secondary | ICD-10-CM

## 2014-03-18 DIAGNOSIS — R32 Unspecified urinary incontinence: Secondary | ICD-10-CM

## 2014-03-18 MED ORDER — OXYBUTYNIN CHLORIDE ER 5 MG PO TB24: 10.0000 mg | ORAL_TABLET | Freq: Every day | ORAL | Status: DC

## 2014-03-18 NOTE — Telephone Encounter (Signed)
Called patient after discussion with Dr. Ihor Dow, prescription reorder, but patient needs appointment with Dr.  No more refills will be given.  Spoke with patient concerning prescription refill and need for follow up appointment, advised patient that we would not be able to fill this medication again without a visit with the provider.  Pt verbalizes understanding.

## 2014-03-23 ENCOUNTER — Other Ambulatory Visit: Payer: Self-pay | Admitting: *Deleted

## 2014-03-23 MED ORDER — ALBUTEROL SULFATE HFA 108 (90 BASE) MCG/ACT IN AERS: 2.0000 | INHALATION_SPRAY | Freq: Four times a day (QID) | RESPIRATORY_TRACT | Status: DC | PRN

## 2014-03-27 ENCOUNTER — Other Ambulatory Visit: Payer: Self-pay | Admitting: *Deleted

## 2014-03-27 MED ORDER — ALBUTEROL SULFATE HFA 108 (90 BASE) MCG/ACT IN AERS: 2.0000 | INHALATION_SPRAY | Freq: Four times a day (QID) | RESPIRATORY_TRACT | Status: DC | PRN

## 2014-03-30 ENCOUNTER — Other Ambulatory Visit: Payer: Self-pay

## 2014-03-30 DIAGNOSIS — R32 Unspecified urinary incontinence: Secondary | ICD-10-CM

## 2014-03-30 DIAGNOSIS — N920 Excessive and frequent menstruation with regular cycle: Secondary | ICD-10-CM

## 2014-03-30 DIAGNOSIS — Z01812 Encounter for preprocedural laboratory examination: Secondary | ICD-10-CM

## 2014-03-30 NOTE — Progress Notes (Signed)
Pt.'s pharmacy [facility] called and stated they have not received the Ditropan XL prescription and pt. Has been calling. Per chart review, prescription was printed. [facility], per [facility]. Pt. Is not to have any refills and is aware per conversation with RN Candace Hazlip that she needs to be seen before she gets another prescription.

## 2014-03-31 ENCOUNTER — Encounter: Payer: Self-pay | Admitting: Cardiovascular Disease

## 2014-03-31 ENCOUNTER — Ambulatory Visit (INDEPENDENT_AMBULATORY_CARE_PROVIDER_SITE_OTHER): Payer: No Typology Code available for payment source | Admitting: Cardiovascular Disease

## 2014-03-31 VITALS — BP 164/100 | HR 59 | Ht 64.0 in | Wt 145.8 lb

## 2014-03-31 DIAGNOSIS — I351 Nonrheumatic aortic (valve) insufficiency: Secondary | ICD-10-CM

## 2014-03-31 DIAGNOSIS — I359 Nonrheumatic aortic valve disorder, unspecified: Secondary | ICD-10-CM

## 2014-03-31 DIAGNOSIS — R42 Dizziness and giddiness: Secondary | ICD-10-CM
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
## 2014-03-31 NOTE — Patient Instructions (Addendum)
Your physician wants you to follow-up in:  12 months. You will receive a reminder letter in the mail two months in advance. If you don't receive a letter, please call our office to schedule the follow-up appointment.  Week or 2 after echocardiogram done.   Your physician has requested that you have an echocardiogram. Echocardiography is a painless test that uses sound waves to create images of your heart. It provides your doctor with information about the size and shape of your heart and how well your heart's chambers and valves are working. This procedure takes approximately one hour. There are no restrictions for this procedure. To be done in April 2016

## 2014-04-09 ENCOUNTER — Telehealth: Payer: Self-pay

## 2014-04-09 ENCOUNTER — Encounter: Payer: Self-pay | Admitting: Family Medicine

## 2014-04-09 ENCOUNTER — Ambulatory Visit (INDEPENDENT_AMBULATORY_CARE_PROVIDER_SITE_OTHER): Payer: No Typology Code available for payment source | Admitting: Family Medicine

## 2014-04-09 VITALS — BP 132/80 | HR 63 | Temp 98.4°F | Wt 147.4 lb

## 2014-04-09 DIAGNOSIS — I10 Essential (primary) hypertension: Secondary | ICD-10-CM

## 2014-04-09 DIAGNOSIS — R143 Flatulence: Secondary | ICD-10-CM

## 2014-04-09 DIAGNOSIS — R29898 Other symptoms and signs involving the musculoskeletal system: Secondary | ICD-10-CM

## 2014-04-09 DIAGNOSIS — H9110 Presbycusis, unspecified ear: Secondary | ICD-10-CM

## 2014-04-09 DIAGNOSIS — R142 Eructation: Secondary | ICD-10-CM

## 2014-04-09 DIAGNOSIS — N920 Excessive and frequent menstruation with regular cycle: Secondary | ICD-10-CM

## 2014-04-09 DIAGNOSIS — R32 Unspecified urinary incontinence: Secondary | ICD-10-CM

## 2014-04-09 DIAGNOSIS — R141 Gas pain: Secondary | ICD-10-CM

## 2014-04-09 DIAGNOSIS — Z01812 Encounter for preprocedural laboratory examination: Secondary | ICD-10-CM

## 2014-04-09 MED ORDER — OXYBUTYNIN CHLORIDE ER 5 MG PO TB24: 10.0000 mg | ORAL_TABLET | Freq: Every day | ORAL | Status: DC

## 2014-04-09 NOTE — Telephone Encounter (Signed)
Pt. Called stating she has an appointment in May but does not have enough Ditropan to last until appointment. Requesting refill. Refill approved per Dr. Ihor Dow; 4 refills to be given. Pt. Informed.

## 2014-04-09 NOTE — Patient Instructions (Signed)
Paresthesia °Paresthesia is a burning or prickling feeling. This feeling can happen in any part of the body. It often happens in the hands, arms, legs, or feet. °HOME CARE °· Avoid drinking alcohol. °· Try massage or needle therapy (acupuncture) to help with your problems. °· Keep all doctor visits as told. °GET HELP RIGHT AWAY IF:  °· You feel weak. °· You have trouble walking or moving. °· You have problems speaking or seeing. °· You feel confused. °· You cannot control when you poop (bowel movement) or pee (urinate). °· You lose feeling (numbness) after an injury. °· You pass out (faint). °· Your burning or prickling feeling gets worse when you walk. °· You have pain, cramps, or feel dizzy. °· You have a rash. °MAKE SURE YOU:  °· Understand these instructions. °· Will watch your condition. °· Will get help right away if you are not doing well or get worse. °Document Released: 11/16/2008 Document Revised: 02/26/2012 Document Reviewed: 08/25/2011 °Exit[employer]® Patient Information ©2014 [employer]. ° °

## 2014-04-09 NOTE — Telephone Encounter (Signed)
Message copied by Michel Harrow on Thu Apr 09, 2014  8:54 AM ------      Message from: Lavonia Drafts      Created: Wed Apr 01, 2014  8:43 AM       Yes ma'am.  Please give her 4 refills and remind her that she is due for her annual GYN exam in Aug/Sept.            Thx,      clh-S             ----- Message -----         From: Synetta Fail, LPN         Sent: 4/65/6812   9:52 AM           To: Lavonia Drafts, MD            Dr. Ihor Dow,            This pt's pharmacy, [facility], faxed a request for Ditropan XL 10 mg.  Would you like to refill?            Thank you for your time      Sharnese Heath        ------
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
## 2014-04-09 NOTE — Telephone Encounter (Signed)
Called pt and pt informed me that she has already picked up her refill on her Ditropan.  Advised pt that she call us at the clinics with any questions, comments, concerns.  Pt agreed.

## 2014-04-10 DIAGNOSIS — H9110 Presbycusis, unspecified ear: Secondary | ICD-10-CM | POA: Insufficient documentation

## 2014-04-10 DIAGNOSIS — H9190 Unspecified hearing loss, unspecified ear: Secondary | ICD-10-CM | POA: Insufficient documentation

## 2014-04-10 NOTE — Assessment & Plan Note (Signed)
Hearing screening test done,she was able to hear at 40 db. As discussed with her she will need full audiometry testing. Referral to ENT will be beneficial but at this time she does not have insurance. Full assessment was not done at this visit since she only mentioned this problem when I was about to her AVS summary. Patient advised to reschedule follow up for full ENT assessment. She agreed with plan.

## 2014-04-10 NOTE — Assessment & Plan Note (Signed)
Improved a lot. Continue to monitor for complete resolution. I again discuss with her the need for colonoscopy. Stool guaiac testing done few months ago was normal. She will like to get colonoscopy done once she has an insurance.

## 2014-04-10 NOTE — Assessment & Plan Note (Signed)
BP ok. No dose adjustment needed at this time.

## 2014-04-10 NOTE — Assessment & Plan Note (Signed)
Symptom persist. MRI brain neck done in Nov 2013 reviewed which showed cervical spondylosis. MR cervical spine done April 2014 cervical spondylosis/spinal stenosis and myelomalacia. She has since then had cervical decompression surgery done but her symptoms persisted. As per patient, EMG done by Dr Griffin Dakin, I suggested she gets a copy for me to review. She does not have insurance, currently on workers comp,I will try to refer to another neurologist if it can go through workers comp.

## 2014-05-05 ENCOUNTER — Encounter: Payer: Self-pay | Admitting: Family Medicine

## 2014-05-05 ENCOUNTER — Ambulatory Visit (INDEPENDENT_AMBULATORY_CARE_PROVIDER_SITE_OTHER): Payer: No Typology Code available for payment source | Admitting: Family Medicine

## 2014-05-05 VITALS — BP 135/80 | HR 60 | Temp 98.6°F | Wt 143.0 lb

## 2014-05-05 DIAGNOSIS — I10 Essential (primary) hypertension: Secondary | ICD-10-CM

## 2014-05-05 DIAGNOSIS — H1010 Acute atopic conjunctivitis, unspecified eye: Secondary | ICD-10-CM

## 2014-05-05 DIAGNOSIS — H1045 Other chronic allergic conjunctivitis: Secondary | ICD-10-CM

## 2014-05-05 DIAGNOSIS — L989 Disorder of the skin and subcutaneous tissue, unspecified: Secondary | ICD-10-CM | POA: Insufficient documentation

## 2014-05-05 DIAGNOSIS — R21 Rash and other nonspecific skin eruption: Secondary | ICD-10-CM

## 2014-05-05 DIAGNOSIS — R42 Dizziness and giddiness: Secondary | ICD-10-CM | POA: Insufficient documentation

## 2014-05-05 HISTORY — DX: Disorder of the skin and subcutaneous tissue, unspecified: L98.9

## 2014-05-05 MED ORDER — HYDROCORTISONE VALERATE 0.2 % EX OINT: 1.0000 "application " | TOPICAL_OINTMENT | Freq: Two times a day (BID) | CUTANEOUS | Status: DC

## 2014-05-05 MED ORDER — OLOPATADINE HCL 0.2 % OP SOLN: 1.0000 [drp] | Freq: Every day | OPHTHALMIC | Status: DC

## 2014-05-05 MED ORDER — CLONIDINE HCL 0.1 MG PO TABS
0.1000 mg | ORAL_TABLET | Freq: Once | ORAL | Status: AC
  Administered 2014-05-05: 0.1 mg via ORAL
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
## 2014-05-05 NOTE — Assessment & Plan Note (Signed)
Intermittent, currently asymptomatic. May be due to her Zanaflex. Plan is to reduce dose or take less often. I discussed with her if stopped abruptly can lead to rebound hypertension. Is symptom persist will get CBC and possibly imaging of her head. Patient instructed to follow up in 1 wk or sooner if symptom persist or worsens. She agreed with plan.

## 2014-05-05 NOTE — Assessment & Plan Note (Signed)
No sign of infection. Pataday prescribed. Return precaution given.

## 2014-05-05 NOTE — Patient Instructions (Signed)
How to Take Your Blood Pressure  These instructions are only for electronic home blood pressure machines. You will need:   An automatic or semi-automatic blood pressure machine.  Fresh batteries for the blood pressure machine. HOW DO I USE THESE TOOLS TO CHECK MY BLOOD PRESSURE?   There are 2 numbers that make up your blood pressure. For example: 120/80.  The first number (120 in our example) is called the "systolic pressure." It is a measure of the pressure in your blood vessels when your heart is pumping blood.  The second number (80 in our example) is called the "diastolic pressure." It is a measure of the pressure in your blood vessels when your heart is resting between beats.  Before you buy a home blood pressure machine, check the size of your arm so you can buy the right size cuff. Here is how to check the size of your arm:  Use a tape measure that shows both inches and centimeters.  Wrap the tape measure around the middle upper part of your arm. You may need someone to help you measure right.  Write down your arm measurement in both inches and centimeters.  To measure your blood pressure right, it is important to have the right size cuff.  If your arm is up to 13 inches (37 to 34 centimeters), get an adult cuff size.  If your arm is 13 to 17 inches (35 to 44 centimeters), get a large adult cuff size.  If your arm is 17 to 20 inches (45 to 52 centimeters), get an adult thigh cuff.  Try to rest or relax for at least 30 minutes before you check your blood pressure.  Do not smoke.  Do not have any drinks with caffeine, such as:  Pop.  Coffee.  Tea.  Check your blood pressure in a quiet room.  Sit down and stretch out your arm on a table. Keep your arm at about the level of your heart. Let your arm relax. GETTING BLOOD PRESSURE READINGS  Make sure you remove any tight-fighting clothing from your arm. Wrap the cuff around your upper arm. Wrap it just above the bend,  and above where you felt the pulse. You should be able to slip a finger between the cuff and your arm. If you cannot slip a finger in the cuff, it is too tight and should be removed and rewrapped.  Some units requires you to manually pump up the arm cuff.  Automatic units inflate the cuff when you press a button.  Cuff deflation is automatic in both models.  After the cuff is inflated, the unit measures your blood pressure and pulse. The readings are displayed on a monitor. Hold still and breathe normally while the cuff is inflated.  Getting a reading takes less than a minute.  Some models store readings in a memory. Some provide a printout of readings.  Get readings at different times of the day. You should wait at least 5 minutes between readings. Take readings with you to your next doctor's visit. Document Released: 11/16/2008 Document Revised: 02/26/2012 Document Reviewed: 11/16/2008 [employer] Patient Information 2014 [employer].  

## 2014-05-05 NOTE — Assessment & Plan Note (Signed)
BP elevated due to Norvasc non-adherence. No neurologic deficit. I checked she does have refill for Norvasc in her pharmacy but she is yet to pick it up. Clonidine 0.1mg  x 1 given to her today. Repeat BP checked improved a lot. May start taking Norvasc tomorrow in addition to her Accurret. Return precaution given, she verbalized understanding.

## 2014-05-05 NOTE — Assessment & Plan Note (Signed)
Rash due to dry skin. Does not appear infectious. Trial of westcort.

## 2014-05-06 ENCOUNTER — Telehealth: Payer: Self-pay | Admitting: *Deleted

## 2014-05-06 ENCOUNTER — Other Ambulatory Visit: Payer: Self-pay | Admitting: Family Medicine

## 2014-05-06 MED ORDER — TRIAMCINOLONE ACETONIDE 0.1 % EX CREA: 1.0000 "application " | TOPICAL_CREAM | Freq: Two times a day (BID) | CUTANEOUS | Status: DC

## 2014-05-06 NOTE — Telephone Encounter (Signed)
error 

## 2014-05-06 NOTE — Telephone Encounter (Signed)
New script placed up front for faxing.

## 2014-05-06 NOTE — Telephone Encounter (Signed)
Received message from [place] regarding the Rx hydrocortisone valerate oint.  [facility]  They do carry triamcinolone 0.1% oint/ cream. Need a new Rx with provider per to change to triamcinolone.  Please call [phone].  Derl Barrow, RN

## 2014-05-07 ENCOUNTER — Encounter: Payer: Self-pay | Admitting: Obstetrics & Gynecology

## 2014-05-07 ENCOUNTER — Ambulatory Visit (INDEPENDENT_AMBULATORY_CARE_PROVIDER_SITE_OTHER): Payer: No Typology Code available for payment source | Admitting: Obstetrics & Gynecology

## 2014-05-07 VITALS — BP 112/77 | HR 66 | Temp 97.6°F | Ht 62.0 in | Wt 147.8 lb

## 2014-05-07 DIAGNOSIS — N393 Stress incontinence (female) (male): Secondary | ICD-10-CM

## 2014-05-07 DIAGNOSIS — N938 Other specified abnormal uterine and vaginal bleeding: Secondary | ICD-10-CM

## 2014-05-07 DIAGNOSIS — D259 Leiomyoma of uterus, unspecified: Secondary | ICD-10-CM

## 2014-05-07 DIAGNOSIS — D219 Benign neoplasm of connective and other soft tissue, unspecified: Secondary | ICD-10-CM

## 2014-05-07 DIAGNOSIS — R32 Unspecified urinary incontinence: Secondary | ICD-10-CM

## 2014-05-07 DIAGNOSIS — N949 Unspecified condition associated with female genital organs and menstrual cycle: Secondary | ICD-10-CM

## 2014-05-07 LAB — CBC
HEMATOCRIT: 33.1 % — AB (ref 36.0–46.0)
HEMOGLOBIN: 11.3 g/dL — AB (ref 12.0–15.0)
MCH: 29.3 pg (ref 26.0–34.0)
MCHC: 34.1 g/dL (ref 30.0–36.0)
MCV: 85.8 fL (ref 78.0–100.0)
Platelets: 284 10*3/uL (ref 150–400)
RBC: 3.86 MIL/uL — ABNORMAL LOW (ref 3.87–5.11)
RDW: 14.1 % (ref 11.5–15.5)
WBC: 3.8 10*3/uL — ABNORMAL LOW (ref 4.0–10.5)

## 2014-05-07 MED ORDER — NORGESTIMATE-ETH ESTRADIOL 0.25-35 MG-MCG PO TABS: 1.0000 | ORAL_TABLET | Freq: Every day | ORAL | Status: DC

## 2014-05-07 MED ORDER — OXYBUTYNIN CHLORIDE ER 15 MG PO TB24: 15.0000 mg | ORAL_TABLET | Freq: Every day | ORAL | Status: DC

## 2014-05-07 NOTE — Patient Instructions (Signed)
Perimenopause  Perimenopause is the time when your body begins to move into the menopause (no menstrual period for 12 straight months). It is a natural process. Perimenopause can begin 2 8 years before the menopause and usually lasts for 1 year after the menopause. During this time, your ovaries may or may not produce an egg. The ovaries vary in their production of estrogen and progesterone hormones each month. This can cause irregular menstrual periods, difficulty getting pregnant, vaginal bleeding between periods, and uncomfortable symptoms.  CAUSES  · Irregular production of the ovarian hormones, estrogen and progesterone, and not ovulating every month.  · Other causes include:  · Tumor of the pituitary gland in the brain.  · Medical disease that affects the ovaries.  · Radiation treatment.  · Chemotherapy.  · Unknown causes.  · Heavy smoking and excessive alcohol intake can bring on perimenopause sooner.  SIGNS AND SYMPTOMS   · Hot flashes.  · Night sweats.  · Irregular menstrual periods.  · Decreased sex drive.  · Vaginal dryness.  · Headaches.  · Mood swings.  · Depression.  · Memory problems.  · Irritability.  · Tiredness.  · Weight gain.  · Trouble getting pregnant.  · The beginning of losing bone cells (osteoporosis).  · The beginning of hardening of the arteries (atherosclerosis).  DIAGNOSIS   Your health care provider will make a diagnosis by analyzing your age, menstrual history, and symptoms. He or she will do a physical exam and note any changes in your body, especially your female organs. Female hormone tests may or may not be helpful depending on the amount of female hormones you produce and when you produce them. However, other hormone tests may be helpful to rule out other problems.  TREATMENT   In some cases, no treatment is needed. The decision on whether treatment is necessary during the perimenopause should be made by you and your health care provider based on how the symptoms are affecting you  and your lifestyle. Various treatments are available, such as:  · Treating individual symptoms with a specific medicine for that symptom.  · Herbal medicines that can help specific symptoms.  · Counseling.  · Group therapy.  HOME CARE INSTRUCTIONS   · Keep track of your menstrual periods (when they occur, how heavy they are, how long between periods, and how long they last) as well as your symptoms and when they started.  · Only take over-the-counter or prescription medicines as directed by your health care provider.  · Sleep and rest.  · Exercise.  · Eat a diet that contains calcium (good for your bones) and soy (acts like the estrogen hormone).  · Do not smoke.  · Avoid alcoholic beverages.  · Take vitamin supplements as recommended by your health care provider. Taking vitamin E may help in certain cases.  · Take calcium and vitamin D supplements to help prevent bone loss.  · Group therapy is sometimes helpful.  · Acupuncture may help in some cases.  SEEK MEDICAL CARE IF:   · You have questions about any symptoms you are having.  · You need a referral to a specialist (gynecologist, psychiatrist, or psychologist).  SEEK IMMEDIATE MEDICAL CARE IF:   · You have vaginal bleeding.  · Your period lasts longer than 8 days.  · Your periods are recurring sooner than 21 days.  · You have bleeding after intercourse.  · You have severe depression.  · You have pain when you urinate.  · 

## 2014-05-07 NOTE — Progress Notes (Signed)
Subjective:     Patient ID: Maria Griffith, female   DOB: November 02, 1961, 53 y.o.   MRN: [id number]  HPI Pt presents for f/u of menorrhagia and anemia.  Pt with a h/o uterine fibroids- denies new sx.  She does report that her cycles are longer but, much lighter than prev.   Her incontinence is improved but, not as much as she would like.  Would like to increase her meds.     Review of Systems     Objective:   Physical Exam BP 112/77  Pulse 66  Temp(Src) 97.6 F (36.4 C) (Oral)  Ht 5\' 2"  (1.575 m)  Wt 147 lb 12.8 oz (67.042 kg)  BMI 27.03 kg/m2  LMP 04/05/2014 Exam deferred  PAP normal 07/2012 07/2013 Clinical Data: Normal uterine bleeding with pain. LMP July 2014  and post myomectomy  TRANSABDOMINAL AND TRANSVAGINAL ULTRASOUND OF PELVIS  Technique: Both transabdominal and transvaginal ultrasound  examinations of the pelvis were performed. Transabdominal technique  was performed for global imaging of the pelvis including uterus,  ovaries, adnexal regions, and pelvic cul-de-sac.  It was necessary to proceed with endovaginal exam following the  transabdominal exam to visualize the myometrium, endometrium and  adnexa.  Comparison: None  Findings: Overall evaluation is compromised by poor resolution on  the transabdominal exam due to poor bladder filling and patient  habitus combined with poor visibility on endovaginal exam due to  uterine size.  Uterus: Is anteverted and anteflexed and is best assessed  transabdominally due to uterine size. The uterus demonstrates a  sagittal length of approximately 13 cm, depth of approximately 8 cm  and width of approximately 9 cm. There is a dominant fibroid  identified in the central posterior portion of the uterus measuring  5.5 x 4.8 x 4.6 cm with best measurements obtained  transabdominally. More diffuse fibroid involvement is felt likely.  Endometrium: Cannot be seen with confidence either transabdominally  or endovaginally due to the  presence of the fibroid load  Right ovary: Is not seen with confidence either transabdominally  or endovaginally  Left ovary: Has a normal appearance endovaginally measuring 3.5 x  2.1 x 2.7 cm  Other findings: A trace of simple free fluid is noted adjacent to  the left ovary.  IMPRESSION:  Enlarged uterus with one dominant measurable fibroid and more  diffuse fibroid involvement felt likely. The central location of  the dominant fibroid makes submucosal involvement possible with  poor endometrial evaluation possible today. If more complete  evaluation of the relationship between the endometrium and fibroid  is desired, pelvic MRI would be recommended.  Normal left ovary. Non-visualized right ovary     Assessment:     Female incontinence- improved on 10mg  but pt would like an increased dosage for better relief Bleeding- somewhat improved. Pt would like to continue OCP's    Plan:     Ditropan XL increase from 10mg  q day to 15mg  daily F/u 4 months CBC today (to assess improvement of anemia) continue Sprintec

## 2014-05-08 ENCOUNTER — Other Ambulatory Visit: Payer: Self-pay | Admitting: *Deleted

## 2014-05-08 MED ORDER — QUINAPRIL-HYDROCHLOROTHIAZIDE 20-12.5 MG PO TABS: 1.0000 | ORAL_TABLET | Freq: Every day | ORAL | Status: DC

## 2014-05-12 ENCOUNTER — Telehealth: Payer: Self-pay

## 2014-05-12 NOTE — Telephone Encounter (Signed)
Message copied by Geanie Logan on Tue May 12, 2014  9:43 AM ------      Message from: Lavonia Drafts      Created: Fri May 08, 2014  1:58 PM       Please call pt. Her hemoglobin is slightly higher than on her prev visit.  She should continue her current treatment (OCP's).            Thx,      clh-S  ------

## 2014-05-12 NOTE — Telephone Encounter (Signed)
Patient informed. No questions or concerns.

## 2014-05-14 ENCOUNTER — Ambulatory Visit: Payer: Self-pay | Admitting: Family Medicine

## 2014-06-01 ENCOUNTER — Other Ambulatory Visit: Payer: Self-pay | Admitting: *Deleted

## 2014-06-02 MED ORDER — QUINAPRIL-HYDROCHLOROTHIAZIDE 20-12.5 MG PO TABS: 1.0000 | ORAL_TABLET | Freq: Every day | ORAL | Status: DC

## 2014-06-09 ENCOUNTER — Encounter: Payer: Self-pay | Admitting: Physical Medicine & Rehabilitation

## 2014-06-30 ENCOUNTER — Other Ambulatory Visit: Payer: Self-pay | Admitting: *Deleted

## 2014-06-30 MED ORDER — FLUTICASONE PROPIONATE HFA 220 MCG/ACT IN AERO: 1.0000 | INHALATION_SPRAY | Freq: Two times a day (BID) | RESPIRATORY_TRACT | Status: DC

## 2014-07-27 ENCOUNTER — Emergency Department (HOSPITAL_COMMUNITY)
Admission: EM | Admit: 2014-07-27 | Discharge: 2014-07-28 | Disposition: A | Discharge status: Home / Self Care | Payer: Worker's Compensation | Attending: Emergency Medicine | Admitting: Emergency Medicine

## 2014-07-27 ENCOUNTER — Encounter (HOSPITAL_COMMUNITY): Payer: Self-pay | Admitting: Emergency Medicine

## 2014-07-27 DIAGNOSIS — K5900 Constipation, unspecified: Secondary | ICD-10-CM | POA: Diagnosis not present

## 2014-07-27 DIAGNOSIS — M549 Dorsalgia, unspecified: Secondary | ICD-10-CM | POA: Insufficient documentation

## 2014-07-27 DIAGNOSIS — Z8719 Personal history of other diseases of the digestive system: Secondary | ICD-10-CM | POA: Insufficient documentation

## 2014-07-27 DIAGNOSIS — Z87828 Personal history of other (healed) physical injury and trauma: Secondary | ICD-10-CM | POA: Insufficient documentation

## 2014-07-27 DIAGNOSIS — J45909 Unspecified asthma, uncomplicated: Secondary | ICD-10-CM | POA: Insufficient documentation

## 2014-07-27 DIAGNOSIS — Z872 Personal history of diseases of the skin and subcutaneous tissue: Secondary | ICD-10-CM | POA: Diagnosis not present

## 2014-07-27 DIAGNOSIS — F411 Generalized anxiety disorder: Secondary | ICD-10-CM | POA: Diagnosis not present

## 2014-07-27 DIAGNOSIS — G43109 Migraine with aura, not intractable, without status migrainosus: Secondary | ICD-10-CM | POA: Insufficient documentation

## 2014-07-27 DIAGNOSIS — M542 Cervicalgia: Secondary | ICD-10-CM | POA: Insufficient documentation

## 2014-07-27 DIAGNOSIS — IMO0002 Reserved for concepts with insufficient information to code with codable children: Secondary | ICD-10-CM | POA: Diagnosis not present

## 2014-07-27 DIAGNOSIS — F329 Major depressive disorder, single episode, unspecified: Secondary | ICD-10-CM | POA: Insufficient documentation

## 2014-07-27 DIAGNOSIS — Z981 Arthrodesis status: Secondary | ICD-10-CM | POA: Insufficient documentation

## 2014-07-27 DIAGNOSIS — F3289 Other specified depressive episodes: Secondary | ICD-10-CM | POA: Diagnosis not present

## 2014-07-27 DIAGNOSIS — Z8742 Personal history of other diseases of the female genital tract: Secondary | ICD-10-CM | POA: Insufficient documentation

## 2014-07-27 DIAGNOSIS — G4700 Insomnia, unspecified: Secondary | ICD-10-CM | POA: Insufficient documentation

## 2014-07-27 DIAGNOSIS — G8918 Other acute postprocedural pain: Secondary | ICD-10-CM | POA: Insufficient documentation

## 2014-07-27 DIAGNOSIS — I10 Essential (primary) hypertension: Secondary | ICD-10-CM | POA: Insufficient documentation

## 2014-07-27 DIAGNOSIS — Z79899 Other long term (current) drug therapy: Secondary | ICD-10-CM | POA: Insufficient documentation
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
## 2014-07-27 NOTE — ED Notes (Signed)
Pt. reports chronic back pain worse today , pain at upper back radiating to shoulders and left back , denies injury , pain unrelieved by prescription Hydrocodone .

## 2014-07-28 ENCOUNTER — Emergency Department (HOSPITAL_COMMUNITY): Payer: Worker's Compensation

## 2014-07-28 MED ORDER — HYDROMORPHONE HCL PF 1 MG/ML IJ SOLN
1.0000 mg | Freq: Once | INTRAMUSCULAR | Status: AC
  Administered 2014-07-28: 1 mg via INTRAVENOUS
  Filled 2014-07-28: qty 1

## 2014-07-28 MED ORDER — NAPROXEN 500 MG PO TABS: 500.0000 mg | ORAL_TABLET | Freq: Two times a day (BID) | ORAL | Status: DC

## 2014-07-28 MED ORDER — DIPHENHYDRAMINE HCL 50 MG/ML IJ SOLN
25.0000 mg | Freq: Once | INTRAMUSCULAR | Status: AC
  Administered 2014-07-28: 25 mg via INTRAVENOUS
  Filled 2014-07-28: qty 1

## 2014-07-28 MED ORDER — DIAZEPAM 10 MG PO TABS
10.0000 mg | ORAL_TABLET | Freq: Three times a day (TID) | ORAL | Status: DC | PRN
Start: 2014-07-28 — End: 2014-08-10

## 2014-07-28 MED ORDER — OXYCODONE-ACETAMINOPHEN 7.5-325 MG PO TABS: 1.0000 | ORAL_TABLET | ORAL | Status: DC | PRN

## 2014-07-28 MED ORDER — METHOCARBAMOL 1000 MG/10ML IJ SOLN: 1000.0000 mg | Freq: Once | INTRAMUSCULAR | Status: DC

## 2014-07-28 MED ORDER — METHOCARBAMOL 1000 MG/10ML IJ SOLN
1000.0000 mg | Freq: Once | INTRAVENOUS | Status: AC
  Administered 2014-07-28: 1000 mg via INTRAVENOUS
  Filled 2014-07-28: qty 10

## 2014-07-28 NOTE — ED Notes (Signed)
Dr. Glick at bedside.  

## 2014-07-28 NOTE — Progress Notes (Addendum)
  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    d/c by Dr Roxanne Mins with Rx for percocet 1 tab7.5/325 mg q 4 hrs prn pain 30 tabs no refills  Valium 1 tab 10 mg po tid prn for muscle spasms 30 tabs no refills  naproxen 1 tab 500 mg bid 30 tabs no refills  pcp dr Andrena Mews     Action/Plan:   ED CM received a call at 1220 8/115 from [address] [phone] States Worker's comp stating will pay for all meds except valium requesting forms to be completed by EDP CM requested form be faxed   Action/Plan Detail:   her at [phone] Cm spoke with Dr Stevie Kern at [phone] about this Cm faxed him the forms to [phone] to review to see if can be completed or if pt may have to pay out of pocket for valium if not covered by worker comp Pending   Anticipated DC Date:  07/28/2014     Status Recommendation to Physician:   Result of Recommendation:    Other ED [place]  Other  Outpatient Services - Pt will follow up  Medication Assistance    Choice offered to / List presented to:            Status of service:  Completed, signed off  ED Comments:   ED Comments Detail:   07/28/14 1251 ED CM received a call from Dr Stevie Kern stating he completed Worker comp forms and will fax to [facility] at [phone] or [phone] for [employer] prior authorization department Cm updated Edwena Blow at [facility]  who  states she will check to see if the completed forms have arrived Cm signing off

## 2014-07-28 NOTE — ED Notes (Signed)
Patient transported to X-ray 

## 2014-07-28 NOTE — Discharge Instructions (Signed)
Keep your appointment with the pain clinic.  Chronic Pain Chronic pain can be defined as pain that is off and on and lasts for 3-6 months or longer. Many things cause chronic pain, which can make it difficult to make a diagnosis. There are many treatment options available for chronic pain. However, finding a treatment that works well for you may require trying various approaches until the right one is found. Many people benefit from a combination of two or more types of treatment to control their pain. SYMPTOMS  Chronic pain can occur anywhere in the body and can range from mild to very severe. Some types of chronic pain include:  Headache.  Low back pain.  Cancer pain.  Arthritis pain.  Neurogenic pain. This is pain resulting from damage to nerves. People with chronic pain may also have other symptoms such as:  Depression.  Anger.  Insomnia.  Anxiety. DIAGNOSIS  Your health care provider will help diagnose your condition over time. In many cases, the initial focus will be on excluding possible conditions that could be causing the pain. Depending on your symptoms, your health care provider may order tests to diagnose your condition. Some of these tests may include:   Blood tests.   CT scan.   MRI.   X-rays.   Ultrasounds.   Nerve conduction studies.  You may need to see a specialist.  TREATMENT  Finding treatment that works well may take time. You may be referred to a pain specialist. He or she may prescribe medicine or therapies, such as:   Mindful meditation or yoga.  Shots (injections) of numbing or pain-relieving medicines into the spine or area of pain.  Local electrical stimulation.  Acupuncture.   Massage therapy.   Aroma, color, light, or sound therapy.   Biofeedback.   Working with a physical therapist to keep from getting stiff.   Regular, gentle exercise.   Cognitive or behavioral therapy.   Group support.  Sometimes, surgery  may be recommended.  HOME CARE INSTRUCTIONS   Take all medicines as directed by your health care provider.   Lessen stress in your life by relaxing and doing things such as listening to calming music.   Exercise or be active as directed by your health care provider.   Eat a healthy diet and include things such as vegetables, fruits, fish, and lean meats in your diet.   Keep all follow-up appointments with your health care provider.   Attend a support group with others suffering from chronic pain. SEEK MEDICAL CARE IF:   Your pain gets worse.   You develop a new pain that was not there before.   You cannot tolerate medicines given to you by your health care provider.   You have new symptoms since your last visit with your health care provider.  SEEK IMMEDIATE MEDICAL CARE IF:   You feel weak.   You have decreased sensation or numbness.   You lose control of bowel or bladder function.   Your pain suddenly gets much worse.   You develop shaking.  You develop chills.  You develop confusion.  You develop chest pain.  You develop shortness of breath.  MAKE SURE YOU:  Understand these instructions.  Will watch your condition.  Will get help right away if you are not doing well or get worse. Document Released: 08/26/2002 Document Revised: 08/06/2013 Document Reviewed: 05/30/2013 [facility] Patient Information 2015 [place], [place]. This information is not intended to replace advice given to you by your  health care provider. Make sure you discuss any questions you have with your health care provider.  Naproxen and naproxen sodium oral immediate-release tablets What is this medicine? NAPROXEN (na PROX en) is a non-steroidal anti-inflammatory drug (NSAID). It is used to reduce swelling and to treat pain. This medicine may be used for dental pain, headache, or painful monthly periods. It is also used for painful joint and muscular problems such as arthritis,  tendinitis, bursitis, and gout. This medicine may be used for other purposes; ask your health care provider or pharmacist if you have questions. COMMON BRAND NAME(S): Aflaxen, Aleve, Aleve Arthritis, All Day Relief, Anaprox, Anaprox DS, Naprosyn What should I tell my health care provider before I take this medicine? They need to know if you have any of these conditions: -asthma -cigarette smoker -drink more than 3 alcohol containing drinks a day -heart disease or circulation problems such as heart failure or leg edema (fluid retention) -high blood pressure -kidney disease -liver disease -stomach bleeding or ulcers -an unusual or allergic reaction to naproxen, aspirin, other NSAIDs, other medicines, foods, dyes, or preservatives -pregnant or trying to get pregnant -breast-feeding How should I use this medicine? Take this medicine by mouth with a glass of water. Follow the directions on the prescription label. Take it with food if your stomach gets upset. Try to not lie down for at least 10 minutes after you take it. Take your medicine at regular intervals. Do not take your medicine more often than directed. Long-term, continuous use may increase the risk of heart attack or stroke. A special MedGuide will be given to you by the pharmacist with each prescription and refill. Be sure to read this information carefully each time. Talk to your pediatrician regarding the use of this medicine in children. Special care may be needed. Overdosage: If you think you have taken too much of this medicine contact a poison control center or emergency room at once. NOTE: This medicine is only for you. Do not share this medicine with others. What if I miss a dose? If you miss a dose, take it as soon as you can. If it is almost time for your next dose, take only that dose. Do not take double or extra doses. What may interact with this  medicine? -alcohol -aspirin -cidofovir -diuretics -lithium -methotrexate -other drugs for inflammation like ketorolac or prednisone -pemetrexed -probenecid -warfarin This list may not describe all possible interactions. Give your health care provider a list of all the medicines, herbs, non-prescription drugs, or dietary supplements you use. Also tell them if you smoke, drink alcohol, or use illegal drugs. Some items may interact with your medicine. What should I watch for while using this medicine? Tell your doctor or health care professional if your pain does not get better. Talk to your doctor before taking another medicine for pain. Do not treat yourself. This medicine does not prevent heart attack or stroke. In fact, this medicine may increase the chance of a heart attack or stroke. The chance may increase with longer use of this medicine and in people who have heart disease. If you take aspirin to prevent heart attack or stroke, talk with your doctor or health care professional. Do not take other medicines that contain aspirin, ibuprofen, or naproxen with this medicine. Side effects such as stomach upset, nausea, or ulcers may be more likely to occur. Many medicines available without a prescription should not be taken with this medicine. This medicine can cause ulcers and bleeding in  the stomach and intestines at any time during treatment. Do not smoke cigarettes or drink alcohol. These increase irritation to your stomach and can make it more susceptible to damage from this medicine. Ulcers and bleeding can happen without warning symptoms and can cause death. You may get drowsy or dizzy. Do not drive, use machinery, or do anything that needs mental alertness until you know how this medicine affects you. Do not stand or sit up quickly, especially if you are an older patient. This reduces the risk of dizzy or fainting spells. This medicine can cause you to bleed more easily. Try to avoid damage  to your teeth and gums when you brush or floss your teeth. What side effects may I notice from receiving this medicine? Side effects that you should report to your doctor or health care professional as soon as possible: -black or bloody stools, blood in the urine or vomit -blurred vision -chest pain -difficulty breathing or wheezing -nausea or vomiting -severe stomach pain -skin rash, skin redness, blistering or peeling skin, hives, or itching -slurred speech or weakness on one side of the body -swelling of eyelids, throat, lips -unexplained weight gain or swelling -unusually weak or tired -yellowing of eyes or skin Side effects that usually do not require medical attention (report to your doctor or health care professional if they continue or are bothersome): -constipation -headache -heartburn This list may not describe all possible side effects. Call your doctor for medical advice about side effects. You may report side effects to FDA at 1-800-FDA-1088. Where should I keep my medicine? Keep out of the reach of children. Store at room temperature between 15 and 30 degrees C (59 and 86 degrees F). Keep container tightly closed. Throw away any unused medicine after the expiration date. NOTE: This sheet is a summary. It may not cover all possible information. If you have questions about this medicine, talk to your doctor, pharmacist, or health care provider.  2015, Elsevier/[employer] Standard. (2009-12-06 20:10:16)  Diazepam tablets What is this medicine? DIAZEPAM (dye AZ e pam) is a benzodiazepine. It is used to treat anxiety and nervousness. It also can help treat alcohol withdrawal, relax muscles, and treat certain types of seizures. This medicine may be used for other purposes; ask your health care provider or pharmacist if you have questions. COMMON BRAND NAME(S): Valium What should I tell my health care provider before I take this medicine? They need to know if you have any of these  conditions -an alcohol or drug abuse problem -bipolar disorder, depression, psychosis or other mental health condition -glaucoma -kidney or liver disease -lung or breathing disease -myasthenia gravis -Parkinson's disease -seizures or a history of seizures -suicidal thoughts -an unusual or allergic reaction to diazepam, other benzodiazepines, foods, dyes, or preservatives -pregnant or trying to get pregnant -breast-feeding How should I use this medicine? Take this medicine by mouth with a glass of water. Follow the directions on the prescription label. If this medicine upsets your stomach, take it with food or milk. Take your doses at regular intervals. Do not take your medicine more often than directed. If you have been taking this medicine regularly for some time, do not suddenly stop taking it. You must gradually reduce the dose or you may get severe side effects. Ask your doctor or health care professional for advice. Even after you stop taking this medicine it can still affect your body for several days. Talk to your pediatrician regarding the use of this medicine in children. Special  care may be needed. Overdosage: If you think you have taken too much of this medicine contact a poison control center or emergency room at once. NOTE: This medicine is only for you. Do not share this medicine with others. What if I miss a dose? If you miss a dose, take it as soon as you can. If it is almost time for your next dose, take only that dose. Do not take double or extra doses. What may interact with this medicine? -cimetidine -grapefruit juice -herbal or dietary supplements like kava kava, melatonin, St. John's Wort, or valerian -medicines for anxiety or sleeping problems, like alprazolam, lorazepam, or triazolam -medicines for depression, mental problems or psychiatric disturbances -medicines for HIV infection or AIDS -prescription pain medicines -rifampin, rifapentine, or rifabutin -some  medicines for seizures like carbamazepine, phenobarbital, phenytoin, or primidone This list may not describe all possible interactions. Give your health care provider a list of all the medicines, herbs, non-prescription drugs, or dietary supplements you use. Also tell them if you smoke, drink alcohol, or use illegal drugs. Some items may interact with your medicine. What should I watch for while using this medicine? Visit your doctor or health care professional for regular checks on your progress. Your body can become dependent on this medicine. Ask your doctor or health care professional if you still need to take it. You may get drowsy or dizzy. Do not drive, use machinery, or do anything that needs mental alertness until you know how this medicine affects you. To reduce the risk of dizzy and fainting spells, do not stand or sit up quickly, especially if you are an older patient. Alcohol may increase dizziness and drowsiness. Avoid alcoholic drinks. Do not treat yourself for coughs, colds or allergies without asking your doctor or health care professional for advice. Some ingredients can increase possible side effects. What side effects may I notice from receiving this medicine? Side effects that you should report to your doctor or health care professional as soon as possible: -allergic reactions like skin rash, itching or hives, swelling of the face, lips, or tongue -angry, confused, depressed, other mood changes -breathing problems -feeling faint or lightheaded, falls -muscle cramps -problems with balance, talking, walking -restlessness -tremors -trouble passing urine or change in the amount of urine -unusually weak or tired Side effects that usually do not require medical attention (report to your doctor or health care professional if they continue or are bothersome): -difficulty sleeping, nightmares -dizziness, drowsiness, clumsiness, or unsteadiness, a hangover effect -headache -nausea,  vomiting This list may not describe all possible side effects. Call your doctor for medical advice about side effects. You may report side effects to FDA at 1-800-FDA-1088. Where should I keep my medicine? Keep out of the reach of children. This medicine can be abused. Keep your medicine in a safe place to protect it from theft. Do not share this medicine with anyone. Selling or giving away this medicine is dangerous and against the law. Store at room temperature between 15 and 30 degrees C (59 and 86 degrees F). Protect from light. Keep container tightly closed. Throw away any unused medicine after the expiration date. NOTE: This sheet is a summary. It may not cover all possible information. If you have questions about this medicine, talk to your doctor, pharmacist, or health care provider.  2015, [employer]vier/Gold Standard. (2008-03-23 16:57:35)  Acetaminophen; Oxycodone tablets What is this medicine? ACETAMINOPHEN; OXYCODONE (a set a MEE noe fen; ox i KOE done) is a pain reliever. It  is used to treat mild to moderate pain. This medicine may be used for other purposes; ask your health care provider or pharmacist if you have questions. COMMON BRAND NAME(S): Endocet, Magnacet, Narvox, Percocet, Perloxx, Primalev, Primlev, Roxicet, Xolox What should I tell my health care provider before I take this medicine? They need to know if you have any of these conditions: -brain tumor -Crohn's disease, inflammatory bowel disease, or ulcerative colitis -drug abuse or addiction -head injury -heart or circulation problems -if you often drink alcohol -kidney disease or problems going to the bathroom -liver disease -lung disease, asthma, or breathing problems -an unusual or allergic reaction to acetaminophen, oxycodone, other opioid analgesics, other medicines, foods, dyes, or preservatives -pregnant or trying to get pregnant -breast-feeding How should I use this medicine? Take this medicine by mouth with a  full glass of water. Follow the directions on the prescription label. Take your medicine at regular intervals. Do not take your medicine more often than directed. Talk to your pediatrician regarding the use of this medicine in children. Special care may be needed. Patients over 71 years old may have a stronger reaction and need a smaller dose. Overdosage: If you think you have taken too much of this medicine contact a poison control center or emergency room at once. NOTE: This medicine is only for you. Do not share this medicine with others. What if I miss a dose? If you miss a dose, take it as soon as you can. If it is almost time for your next dose, take only that dose. Do not take double or extra doses. What may interact with this medicine? -alcohol -antihistamines -barbiturates like amobarbital, butalbital, butabarbital, methohexital, pentobarbital, phenobarbital, thiopental, and secobarbital -benztropine -drugs for bladder problems like solifenacin, trospium, oxybutynin, tolterodine, hyoscyamine, and methscopolamine -drugs for breathing problems like ipratropium and tiotropium -drugs for certain stomach or intestine problems like propantheline, homatropine methylbromide, glycopyrrolate, atropine, belladonna, and dicyclomine -general anesthetics like etomidate, ketamine, nitrous oxide, propofol, desflurane, enflurane, halothane, isoflurane, and sevoflurane -medicines for depression, anxiety, or psychotic disturbances -medicines for sleep -muscle relaxants -naltrexone -narcotic medicines (opiates) for pain -phenothiazines like perphenazine, thioridazine, chlorpromazine, mesoridazine, fluphenazine, prochlorperazine, promazine, and trifluoperazine -scopolamine -tramadol -trihexyphenidyl This list may not describe all possible interactions. Give your health care provider a list of all the medicines, herbs, non-prescription drugs, or dietary supplements you use. Also tell them if you smoke,  drink alcohol, or use illegal drugs. Some items may interact with your medicine. What should I watch for while using this medicine? Tell your doctor or health care professional if your pain does not go away, if it gets worse, or if you have new or a different type of pain. You may develop tolerance to the medicine. Tolerance means that you will need a higher dose of the medication for pain relief. Tolerance is normal and is expected if you take this medicine for a long time. Do not suddenly stop taking your medicine because you may develop a severe reaction. Your body becomes used to the medicine. This does NOT mean you are addicted. Addiction is a behavior related to getting and using a drug for a non-medical reason. If you have pain, you have a medical reason to take pain medicine. Your doctor will tell you how much medicine to take. If your doctor wants you to stop the medicine, the dose will be slowly lowered over time to avoid any side effects. You may get drowsy or dizzy. Do not drive, use machinery, or do anything  that needs mental alertness until you know how this medicine affects you. Do not stand or sit up quickly, especially if you are an older patient. This reduces the risk of dizzy or fainting spells. Alcohol may interfere with the effect of this medicine. Avoid alcoholic drinks. There are different types of narcotic medicines (opiates) for pain. If you take more than one type at the same time, you may have more side effects. Give your health care provider a list of all medicines you use. Your doctor will tell you how much medicine to take. Do not take more medicine than directed. Call emergency for help if you have problems breathing. The medicine will cause constipation. Try to have a bowel movement at least every 2 to 3 days. If you do not have a bowel movement for 3 days, call your doctor or health care professional. Do not take Tylenol (acetaminophen) or medicines that have acetaminophen  with this medicine. Too much acetaminophen can be very dangerous. Many nonprescription medicines contain acetaminophen. Always read the labels carefully to avoid taking more acetaminophen. What side effects may I notice from receiving this medicine? Side effects that you should report to your doctor or health care professional as soon as possible: -allergic reactions like skin rash, itching or hives, swelling of the face, lips, or tongue -breathing difficulties, wheezing -confusion -light headedness or fainting spells -severe stomach pain -unusually weak or tired -yellowing of the skin or the whites of the eyes Side effects that usually do not require medical attention (report to your doctor or health care professional if they continue or are bothersome): -dizziness -drowsiness -nausea -vomiting This list may not describe all possible side effects. Call your doctor for medical advice about side effects. You may report side effects to FDA at 1-800-FDA-1088. Where should I keep my medicine? Keep out of the reach of children. This medicine can be abused. Keep your medicine in a safe place to protect it from theft. Do not share this medicine with anyone. Selling or giving away this medicine is dangerous and against the law. Store at room temperature between 20 and 25 degrees C (68 and 77 degrees F). Keep container tightly closed. Protect from light. This medicine may cause accidental overdose and death if it is taken by other adults, children, or pets. Flush any unused medicine down the toilet to reduce the chance of harm. Do not use the medicine after the expiration date. NOTE: This sheet is a summary. It may not cover all possible information. If you have questions about this medicine, talk to your doctor, pharmacist, or health care provider.  2015, [employer] Standard. (2013-07-28 13:17:35)

## 2014-07-30 NOTE — Progress Notes (Signed)
07/30/14 ED CM sent message to Dr Stevie Kern & Roxanne Mins about pending authorization fax received Sent information to assist to complete it per fax from [email or web address]

## 2014-08-10 ENCOUNTER — Encounter: Payer: Self-pay | Admitting: Physical Medicine & Rehabilitation

## 2014-08-10 ENCOUNTER — Ambulatory Visit (HOSPITAL_BASED_OUTPATIENT_CLINIC_OR_DEPARTMENT_OTHER): Payer: Worker's Compensation | Admitting: Physical Medicine & Rehabilitation

## 2014-08-10 ENCOUNTER — Encounter: Payer: Worker's Compensation | Attending: Physical Medicine & Rehabilitation

## 2014-08-10 ENCOUNTER — Other Ambulatory Visit: Payer: Self-pay

## 2014-08-10 VITALS — BP 153/87 | HR 75 | Resp 14 | Ht 65.0 in | Wt 137.0 lb

## 2014-08-10 DIAGNOSIS — M961 Postlaminectomy syndrome, not elsewhere classified: Secondary | ICD-10-CM

## 2014-08-10 DIAGNOSIS — G8929 Other chronic pain: Secondary | ICD-10-CM

## 2014-08-10 DIAGNOSIS — M542 Cervicalgia: Secondary | ICD-10-CM | POA: Diagnosis present

## 2014-08-10 MED ORDER — TRAMADOL HCL 50 MG PO TABS: 50.0000 mg | ORAL_TABLET | Freq: Three times a day (TID) | ORAL | Status: DC

## 2014-08-10 MED ORDER — DICLOFENAC SODIUM 1 % TD GEL: 2.0000 g | Freq: Four times a day (QID) | TRANSDERMAL | Status: DC

## 2014-08-10 MED ORDER — BACLOFEN 10 MG PO TABS: 5.0000 mg | ORAL_TABLET | Freq: Three times a day (TID) | ORAL | Status: DC

## 2014-08-10 NOTE — Patient Instructions (Addendum)
PT for balance  Voltaren gel Tramadol instead of hydrocodone Baclofen insead of tizanidine
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
## 2014-08-12 ENCOUNTER — Telehealth: Payer: Self-pay

## 2014-08-12 ENCOUNTER — Ambulatory Visit: Payer: Self-pay

## 2014-08-12 NOTE — Telephone Encounter (Signed)
Patient states the Tramadol is making her sick (n/v)

## 2014-08-13 MED ORDER — MELOXICAM 7.5 MG PO TABS: 7.5000 mg | ORAL_TABLET | Freq: Every day | ORAL | Status: DC

## 2014-08-13 NOTE — Telephone Encounter (Signed)
UDS not available yet May trial Mobic 7.5 mg/day #30 instead Also may increase baclofen to 5mg  QID

## 2014-08-13 NOTE — Telephone Encounter (Signed)
Contacted patient to inform her that Dr. Letta Pate ordered Mobic. The RX was sent to pharmacy. Patient is requesting Hydrocodone. Informed the patient her UDS results were not back yet.

## 2014-08-17 ENCOUNTER — Telehealth: Payer: Self-pay

## 2014-08-17 NOTE — Telephone Encounter (Signed)
UDS not back may d/c tramadol and use tylenol ES 1-2 tab 4 times a day

## 2014-08-17 NOTE — Telephone Encounter (Signed)
Patient states she is still nauseated and vomiting. She is requesting a different pain medication. Please advise.

## 2014-08-18 ENCOUNTER — Other Ambulatory Visit: Payer: Self-pay | Admitting: *Deleted

## 2014-08-18 NOTE — Telephone Encounter (Signed)
Tylenol #31 tablet 3 times a day #9 1 refill

## 2014-08-18 NOTE — Telephone Encounter (Signed)
UDS results are now back. UDS is consistent. Please advise.

## 2014-08-19 MED ORDER — TIZANIDINE HCL 2 MG PO TABS: 2.0000 mg | ORAL_TABLET | Freq: Three times a day (TID) | ORAL | Status: DC

## 2014-08-19 MED ORDER — ALBUTEROL SULFATE HFA 108 (90 BASE) MCG/ACT IN AERS: 2.0000 | INHALATION_SPRAY | Freq: Four times a day (QID) | RESPIRATORY_TRACT | Status: DC | PRN

## 2014-08-19 MED ORDER — ACETAMINOPHEN-CODEINE #3 300-30 MG PO TABS: 1.0000 | ORAL_TABLET | Freq: Three times a day (TID) | ORAL | Status: DC

## 2014-08-19 NOTE — Telephone Encounter (Signed)
D/c meloxicam Start tizanidine 2mg  TID #90 1 RF

## 2014-08-19 NOTE — Telephone Encounter (Signed)
Contacted patient to inform patient that Dr. Letta Pate ordered Tylenol #3. Patient is requesting Hydrocodone.   Patient states the Meloxicam also made her vomit. She is requesting Tizanidine. Please advise.

## 2014-08-19 NOTE — Telephone Encounter (Signed)
Contacted patient to inform her that Tizanidine RX was sent to pharmacy.

## 2014-08-21 ENCOUNTER — Other Ambulatory Visit: Payer: Self-pay | Admitting: Family Medicine

## 2014-08-27 ENCOUNTER — Telehealth: Payer: Self-pay

## 2014-08-27 ENCOUNTER — Other Ambulatory Visit: Payer: Self-pay | Admitting: Obstetrics & Gynecology

## 2014-08-27 DIAGNOSIS — N3281 Overactive bladder: Secondary | ICD-10-CM

## 2014-08-27 MED ORDER — OXYBUTYNIN CHLORIDE ER 15 MG PO TB24: 15.0000 mg | ORAL_TABLET | Freq: Every day | ORAL | Status: DC

## 2014-08-27 NOTE — Telephone Encounter (Signed)
Received a fax from [facility] requesting refill of Ditropan XL. Dr. Ihor Dow agreed to refill medication for patient until May 2016 (patient will be due for annual at this time) so long as it is working for her. Called patient. No answer on home phone. No voicemail box set up. Patient returned call as she saw our number on caller ID. Informed patient of refill approval. Patient states it has been helping her incontinence. She does however complain of still bleeding despite taking Sprintec and states she would like to discuss other options. Informed patient we could schedule her for a follow up appointment to discuss other options. Patient verbalized understanding and gratitude. Call transferred to front office to schedule appointment.

## 2014-08-31 ENCOUNTER — Ambulatory Visit: Payer: Worker's Compensation

## 2014-09-08 ENCOUNTER — Other Ambulatory Visit: Payer: Self-pay | Admitting: *Deleted

## 2014-09-08 ENCOUNTER — Encounter: Payer: Self-pay | Admitting: Physical Medicine & Rehabilitation

## 2014-09-08 ENCOUNTER — Encounter: Payer: Worker's Compensation | Attending: Physical Medicine & Rehabilitation

## 2014-09-08 ENCOUNTER — Ambulatory Visit (HOSPITAL_BASED_OUTPATIENT_CLINIC_OR_DEPARTMENT_OTHER): Payer: PRIVATE HEALTH INSURANCE | Admitting: Physical Medicine & Rehabilitation

## 2014-09-08 VITALS — BP 130/82 | HR 55 | Resp 14 | Ht 65.0 in | Wt 143.2 lb

## 2014-09-08 DIAGNOSIS — M7918 Myalgia, other site: Secondary | ICD-10-CM

## 2014-09-08 DIAGNOSIS — M5412 Radiculopathy, cervical region: Secondary | ICD-10-CM

## 2014-09-08 DIAGNOSIS — IMO0001 Reserved for inherently not codable concepts without codable children: Secondary | ICD-10-CM

## 2014-09-08 DIAGNOSIS — G8929 Other chronic pain: Secondary | ICD-10-CM | POA: Insufficient documentation

## 2014-09-08 DIAGNOSIS — M542 Cervicalgia: Secondary | ICD-10-CM | POA: Diagnosis present

## 2014-09-08 DIAGNOSIS — M961 Postlaminectomy syndrome, not elsewhere classified: Secondary | ICD-10-CM

## 2014-09-08 MED ORDER — ACETAMINOPHEN-CODEINE #4 300-60 MG PO TABS: 1.0000 | ORAL_TABLET | Freq: Three times a day (TID) | ORAL | Status: DC | PRN

## 2014-09-08 MED ORDER — PREGABALIN 50 MG PO CAPS: 50.0000 mg | ORAL_CAPSULE | Freq: Two times a day (BID) | ORAL | Status: DC
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
## 2014-09-08 NOTE — Patient Instructions (Signed)
Trigger point injection left upper trapezius 1% lidocaine today may have post injection soreness 24-48 hours May alternate heat and ice 20 minutes at a time  Zanaflex 2 mg 3 times per day as needed for muscle spasm  Lyrica 50 mg twice a day for burning pain  Tylenol #41 tablet 3 times a day for neck pain

## 2014-09-08 NOTE — Telephone Encounter (Signed)
lyrica called to pharmacy from visit today with Dr Letta Pate.  It was on normal and because of controlled substance it has to be printed or called to pharmacy.

## 2014-09-15 ENCOUNTER — Ambulatory Visit: Payer: Worker's Compensation | Attending: Physical Medicine & Rehabilitation

## 2014-09-15 DIAGNOSIS — R51 Headache: Secondary | ICD-10-CM | POA: Diagnosis not present

## 2014-09-15 DIAGNOSIS — M542 Cervicalgia: Secondary | ICD-10-CM | POA: Insufficient documentation

## 2014-09-15 DIAGNOSIS — R5381 Other malaise: Secondary | ICD-10-CM | POA: Insufficient documentation

## 2014-09-15 DIAGNOSIS — IMO0001 Reserved for inherently not codable concepts without codable children: Secondary | ICD-10-CM | POA: Insufficient documentation

## 2014-09-15 DIAGNOSIS — M25519 Pain in unspecified shoulder: Secondary | ICD-10-CM | POA: Diagnosis not present

## 2014-09-28 ENCOUNTER — Ambulatory Visit: Payer: Worker's Compensation | Admitting: Physical Therapy

## 2014-09-30 ENCOUNTER — Telehealth: Payer: Self-pay | Admitting: *Deleted

## 2014-09-30 ENCOUNTER — Encounter: Payer: Self-pay | Admitting: Physical Therapy

## 2014-09-30 NOTE — Telephone Encounter (Signed)
Patient called and left message that the Tylenol #4 is not working... Has appt on 10/08/14.  AK has no openings before 10/22.

## 2014-10-07 ENCOUNTER — Encounter: Payer: Self-pay | Admitting: Physical Therapy

## 2014-10-08 ENCOUNTER — Encounter: Payer: Worker's Compensation | Attending: Physical Medicine & Rehabilitation

## 2014-10-08 ENCOUNTER — Other Ambulatory Visit: Payer: Self-pay | Admitting: Physical Medicine & Rehabilitation

## 2014-10-08 ENCOUNTER — Encounter: Payer: Self-pay | Admitting: Physical Medicine & Rehabilitation

## 2014-10-08 ENCOUNTER — Ambulatory Visit (HOSPITAL_BASED_OUTPATIENT_CLINIC_OR_DEPARTMENT_OTHER): Payer: PRIVATE HEALTH INSURANCE | Admitting: Physical Medicine & Rehabilitation

## 2014-10-08 ENCOUNTER — Telehealth: Payer: Self-pay | Admitting: Physical Medicine & Rehabilitation

## 2014-10-08 ENCOUNTER — Ambulatory Visit: Payer: Worker's Compensation | Attending: Physical Medicine & Rehabilitation

## 2014-10-08 VITALS — HR 66 | Resp 14 | Ht 62.0 in | Wt 143.2 lb

## 2014-10-08 DIAGNOSIS — Z5189 Encounter for other specified aftercare: Secondary | ICD-10-CM | POA: Diagnosis present

## 2014-10-08 DIAGNOSIS — M542 Cervicalgia: Secondary | ICD-10-CM | POA: Diagnosis not present

## 2014-10-08 DIAGNOSIS — R5381 Other malaise: Secondary | ICD-10-CM | POA: Diagnosis not present

## 2014-10-08 DIAGNOSIS — M25511 Pain in right shoulder: Secondary | ICD-10-CM | POA: Diagnosis not present

## 2014-10-08 DIAGNOSIS — G894 Chronic pain syndrome: Secondary | ICD-10-CM | POA: Diagnosis not present

## 2014-10-08 DIAGNOSIS — M961 Postlaminectomy syndrome, not elsewhere classified: Secondary | ICD-10-CM | POA: Diagnosis not present

## 2014-10-08 DIAGNOSIS — Z79899 Other long term (current) drug therapy: Secondary | ICD-10-CM | POA: Diagnosis not present

## 2014-10-08 DIAGNOSIS — M4722 Other spondylosis with radiculopathy, cervical region: Secondary | ICD-10-CM | POA: Insufficient documentation

## 2014-10-08 DIAGNOSIS — Z5181 Encounter for therapeutic drug level monitoring: Secondary | ICD-10-CM | POA: Diagnosis present

## 2014-10-08 DIAGNOSIS — R51 Headache: Secondary | ICD-10-CM | POA: Diagnosis not present

## 2014-10-08 MED ORDER — HYDROCODONE-ACETAMINOPHEN 5-325 MG PO TABS: 1.0000 | ORAL_TABLET | Freq: Three times a day (TID) | ORAL | Status: DC | PRN

## 2014-10-08 MED ORDER — POLYETHYLENE GLYCOL 3350 17 GM/SCOOP PO POWD: 1.0000 | Freq: Once | ORAL | Status: DC

## 2014-10-08 MED ORDER — TIZANIDINE HCL 4 MG PO TABS: 4.0000 mg | ORAL_TABLET | Freq: Three times a day (TID) | ORAL | Status: DC

## 2014-10-08 NOTE — Telephone Encounter (Signed)
Pt's case manager,  Durenda Age, would like to know if patient will if he can include in his note about pt's ability to drive.
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
## 2014-10-09 LAB — PMP ALCOHOL METABOLITE (ETG): Ethyl Glucuronide (EtG): NEGATIVE ng/mL

## 2014-10-10 ENCOUNTER — Other Ambulatory Visit: Payer: Self-pay | Admitting: Physical Medicine & Rehabilitation

## 2014-10-12 ENCOUNTER — Ambulatory Visit: Payer: PRIVATE HEALTH INSURANCE | Admitting: Obstetrics & Gynecology

## 2014-10-12 ENCOUNTER — Encounter: Payer: Self-pay | Admitting: General Practice

## 2014-10-12 ENCOUNTER — Telehealth: Payer: Self-pay | Admitting: General Practice

## 2014-10-12 LAB — OPIATES/OPIOIDS (LC/MS-MS)
CODEINE URINE: 849 ng/mL — AB (ref <50)
HYDROCODONE: 140 ng/mL — AB (ref <50)
Hydromorphone: 103 ng/mL — AB (ref <50)
Morphine Urine: 1067 ng/mL — AB (ref <50)
Norhydrocodone, Ur: 235 ng/mL — AB (ref <50)
Noroxycodone, Ur: NEGATIVE ng/mL (ref <50)
OXYMORPHONE, URINE: NEGATIVE ng/mL (ref <50)
Oxycodone, ur: NEGATIVE ng/mL (ref <50)

## 2014-10-12 LAB — MEPERIDINE (GC/LC/MS), URINE
MEPERIDINE UR CONFIRM: NEGATIVE ng/mL (ref <100)
NORMEPERIDINE UR CONFIRM: NEGATIVE ng/mL (ref <100)

## 2014-10-12 NOTE — Telephone Encounter (Signed)
Patient no showed for appt today. Called patient, no answer- left message that we are trying to reach you in regards to an appt you missed with [place] today, please contact our front office staff to reschedule this appt. Will send letter

## 2014-10-13 ENCOUNTER — Telehealth: Payer: Self-pay | Admitting: *Deleted

## 2014-10-13 DIAGNOSIS — B9689 Other specified bacterial agents as the cause of diseases classified elsewhere: Secondary | ICD-10-CM

## 2014-10-13 DIAGNOSIS — N760 Acute vaginitis: Principal | ICD-10-CM

## 2014-10-13 LAB — PRESCRIPTION MONITORING PROFILE (SOLSTAS)
Amphetamine/Meth: NEGATIVE ng/mL
Barbiturate Screen, Urine: NEGATIVE ng/mL
Benzodiazepine Screen, Urine: NEGATIVE ng/mL
Buprenorphine, Urine: NEGATIVE ng/mL
CANNABINOID SCRN UR: NEGATIVE ng/mL
COCAINE METABOLITES: NEGATIVE ng/mL
Carisoprodol, Urine: NEGATIVE ng/mL
Creatinine, Urine: 104.98 mg/dL (ref >20.0)
FENTANYL URINE: NEGATIVE ng/mL
MDMA URINE: NEGATIVE ng/mL
METHADONE SCREEN, URINE: NEGATIVE ng/mL
Nitrites, Initial: NEGATIVE ug/mL
Oxycodone Screen, Ur: NEGATIVE ng/mL
Propoxyphene: NEGATIVE ng/mL
Tapentadol, urine: NEGATIVE ng/mL
Tramadol Scrn, Ur: NEGATIVE ng/mL
Zolpidem, Urine: NEGATIVE ng/mL
pH, Initial: 7.2 pH (ref 4.5–8.9)

## 2014-10-13 MED ORDER — METRONIDAZOLE 500 MG PO TABS: 500.0000 mg | ORAL_TABLET | Freq: Two times a day (BID) | ORAL | Status: DC

## 2014-10-13 NOTE — Telephone Encounter (Signed)
[facility] called and left a message that Maria Griffith is her doctor and that she needs to talk to someone about her medicines and her bleeding.  Called Maria Griffith and she c/o that she has fibroids and is having bleeding and not sure if it because of the fibroids. She states the doctor had put her on birth control pills that did Help for a while- states the last 2 months she has went back to having irregular bleeding lasting longer - right now bleeding for 2 weeks- somedays spotting, somedays heavier.   We discussed she missed her last appointment  Recently and has rescheduled it - but it is not until 12/04/14. She wants to know if Dr. Maylene Roes wants to adjust her medicines for her bleeding. She also c/o a bad odor and discharge- we discussed this could be because of her bleeding and it could be BV.  I discussed with Maria Griffith and we will prescribe flagyl. I advised pateint if this does not help the bad odor and discharge then it is something else and she will need to be seen. I advised her to call and see if there have been cancellations and we may be able to see her sooner.  In the meantime will send this message to Maria Griffith to see if she wants to adjust her medicines for bleeding and then we will call Maria Griffith back.

## 2014-10-15 ENCOUNTER — Other Ambulatory Visit: Payer: Self-pay | Admitting: Obstetrics & Gynecology

## 2014-10-15 DIAGNOSIS — D259 Leiomyoma of uterus, unspecified: Secondary | ICD-10-CM

## 2014-10-15 DIAGNOSIS — N939 Abnormal uterine and vaginal bleeding, unspecified: Secondary | ICD-10-CM

## 2014-10-15 MED ORDER — MEGESTROL ACETATE 40 MG PO TABS: 40.0000 mg | ORAL_TABLET | Freq: Two times a day (BID) | ORAL | Status: DC

## 2014-10-16 NOTE — Telephone Encounter (Signed)
Called patient and heard message that voicemail is not set up.

## 2014-10-16 NOTE — Telephone Encounter (Signed)
-----   Message from Lavonia Drafts, MD sent at 10/15/2014 1:10 PM ----- Please see prev note from [facility]. Pt may stop OCPs and take Megace. She should get sone and f/u for Endobx on scheduled date in Dec. Thx, clh-S

## 2014-10-19 ENCOUNTER — Encounter: Payer: Self-pay | Admitting: Physical Medicine & Rehabilitation

## 2014-10-20 NOTE — Telephone Encounter (Signed)
Called patient and informed her of Dr. Vladimir Creeks recommendations-- advised she will stop OCP, start megace, have U/S and f/u in December to discuss U/S results and perform endo bx. Patient verbalized understanding. Requests ultrasound be a Friday afternoon. U/S scheduled for 10/30/14 at 1515. Patient informed. Advised she arrive 15 minutes early with full bladder. Patient verbalized understanding. No further questions or concerns.

## 2014-10-26 ENCOUNTER — Telehealth: Payer: Self-pay | Admitting: General Practice

## 2014-10-26 NOTE — Telephone Encounter (Signed)
Juliann Pulse from the pharmacy called and states that the patient was put on Megace 40mg  on 10/29 by Dr Ihor Dow and the patient is confused about what she is taking the medication for, she thought it was used to treat her bleeding but that is not indicated for that treatment. Patient would like to know if she should be taking the birth control pills or the megace or what the plan is. Pharmacy would like a callback to relay the information to the patient. Called patient back and discussed that Dr Ihor Dow put her on the megace to help with her bleeding and that she is to stop the birth control pills. Patient verbalized understanding and asked if the other M medication was for her infection. Told patient that the metronidazole is for her infection and she will take that twice a day for a week. Patient verbalized understanding and had no other questions

## 2014-10-30 ENCOUNTER — Ambulatory Visit (HOSPITAL_COMMUNITY)
Admission: RE | Admit: 2014-10-30 | Discharge: 2014-10-30 | Disposition: A | Discharge status: Home / Self Care | Payer: PRIVATE HEALTH INSURANCE | Source: Ambulatory Visit | Attending: Obstetrics & Gynecology | Admitting: Obstetrics & Gynecology

## 2014-10-30 DIAGNOSIS — D259 Leiomyoma of uterus, unspecified: Secondary | ICD-10-CM

## 2014-10-30 DIAGNOSIS — N939 Abnormal uterine and vaginal bleeding, unspecified: Secondary | ICD-10-CM

## 2014-10-30 DIAGNOSIS — D251 Intramural leiomyoma of uterus: Secondary | ICD-10-CM | POA: Insufficient documentation

## 2014-10-30 DIAGNOSIS — D250 Submucous leiomyoma of uterus: Secondary | ICD-10-CM | POA: Insufficient documentation

## 2014-11-05 ENCOUNTER — Encounter: Payer: Worker's Compensation | Attending: Physical Medicine & Rehabilitation

## 2014-11-05 ENCOUNTER — Ambulatory Visit (HOSPITAL_BASED_OUTPATIENT_CLINIC_OR_DEPARTMENT_OTHER): Payer: Worker's Compensation | Admitting: Physical Medicine & Rehabilitation

## 2014-11-05 ENCOUNTER — Encounter: Payer: Self-pay | Admitting: Physical Medicine & Rehabilitation

## 2014-11-05 ENCOUNTER — Other Ambulatory Visit: Payer: Self-pay | Admitting: Physical Medicine & Rehabilitation

## 2014-11-05 VITALS — BP 160/90 | HR 76 | Resp 14 | Ht 61.0 in | Wt 142.4 lb

## 2014-11-05 DIAGNOSIS — Z79899 Other long term (current) drug therapy: Secondary | ICD-10-CM | POA: Diagnosis not present

## 2014-11-05 DIAGNOSIS — G894 Chronic pain syndrome: Secondary | ICD-10-CM

## 2014-11-05 DIAGNOSIS — Z5181 Encounter for therapeutic drug level monitoring: Secondary | ICD-10-CM | POA: Diagnosis not present

## 2014-11-05 DIAGNOSIS — M436 Torticollis: Secondary | ICD-10-CM | POA: Insufficient documentation

## 2014-11-05 DIAGNOSIS — M791 Myalgia: Secondary | ICD-10-CM | POA: Diagnosis not present

## 2014-11-05 DIAGNOSIS — M7918 Myalgia, other site: Secondary | ICD-10-CM

## 2014-11-05 MED ORDER — MELOXICAM 7.5 MG PO TABS: 7.5000 mg | ORAL_TABLET | Freq: Every day | ORAL | Status: DC

## 2014-11-05 MED ORDER — HYDROCODONE-ACETAMINOPHEN 5-325 MG PO TABS: 1.0000 | ORAL_TABLET | Freq: Three times a day (TID) | ORAL | Status: DC | PRN

## 2014-11-05 MED ORDER — DICLOFENAC SODIUM 1 % TD GEL: 2.0000 g | Freq: Four times a day (QID) | TRANSDERMAL | Status: DC

## 2014-11-05 NOTE — Patient Instructions (Signed)
Trigger Point Injection Trigger points are areas where you have muscle pain. A trigger point injection is a shot given in the trigger point to relieve that pain. A trigger point might feel like a knot in your muscle. It hurts to press on a trigger point. Sometimes the pain spreads out (radiates) to other parts of the body. For example, pressing on a trigger point in your shoulder might cause pain in your arm or neck. You might have one trigger point. Or, you might have more than one. People often have trigger points in their upper back and lower back. They also occur often in the neck and shoulders. Pain from a trigger point lasts for a long time. It can make it hard to keep moving. You might not be able to do the exercise or physical therapy that could help you deal with the pain. A trigger point injection may help. It does not work for everyone. But, it may relieve your pain for a few days or a few months. A trigger point injection does not cure long-lasting (chronic) pain. LET YOUR CAREGIVER KNOW ABOUT:  Any allergies (especially to latex, lidocaine, or steroids).  Blood-thinning medicines that you take. These drugs can lead to bleeding or bruising after an injection. They include:  Aspirin.  Ibuprofen.  Clopidogrel.  Warfarin.  Other medicines you take. This includes all vitamins, herbs, eyedrops, over-the-counter medicines, and creams.  Use of steroids.  Recent infections.  Past problems with numbing medicines.  Bleeding problems.  Surgeries you have had.  Other health problems. RISKS AND COMPLICATIONS A trigger point injection is a safe treatment. However, problems may develop, such as:  Minor side effects usually go away in 1 to 2 days. These may include:  Soreness.  Bruising.  Stiffness.  More serious problems are rare. But, they may include:  Bleeding under the skin (hematoma).  Skin infection.  Breaking off of the needle under your skin.  Lung  puncture.  The trigger point injection may not work for you. BEFORE THE PROCEDURE You may need to stop taking any medicine that thins your blood. This is to prevent bleeding and bruising. Usually these medicines are stopped several days before the injection. No other preparation is needed. PROCEDURE  A trigger point injection can be given in your caregiver's office or in a clinic. Each injection takes 2 minutes or less.  Your caregiver will feel for trigger points. The caregiver may use a marker to circle the area for the injection.  The skin over the trigger point will be washed with a germ-killing (antiseptic) solution.  The caregiver pinches the spot for the injection.  Then, a very thin needle is used for the shot. You may feel pain or a twitching feeling when the needle enters the trigger point.  A numbing solution may be injected into the trigger point. Sometimes a drug to keep down swelling, redness, and warmth (inflammation) is also injected.  Your caregiver moves the needle around the trigger zone until the tightness and twitching goes away.  After the injection, your caregiver may put gentle pressure over the injection site.  Then it is covered with a bandage. AFTER THE PROCEDURE  You can go right home after the injection.  The bandage can be taken off after a few hours.  You may feel sore and stiff for 1 to 2 days.  Go back to your regular activities slowly. Your caregiver may ask you to stretch your muscles. Do not do anything that takes   extra energy for a few days.  Follow your caregiver's instructions to manage and treat other pain. Document Released: 11/23/2011 Document Revised: 03/31/2013 Document Reviewed: 11/23/2011 ExitCare Patient Information 2015 [employer]. This information is not intended to replace advice given to you by your health care provider. Make sure you discuss any questions you have with your health care provider.  
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
## 2014-11-06 ENCOUNTER — Other Ambulatory Visit: Payer: Self-pay | Admitting: Physical Medicine & Rehabilitation

## 2014-11-06 LAB — PMP ALCOHOL METABOLITE (ETG): ETGU: NEGATIVE ng/mL

## 2014-11-10 LAB — OPIATES/OPIOIDS (LC/MS-MS)
Codeine Urine: NEGATIVE ng/mL (ref <50)
HYDROCODONE: NEGATIVE ng/mL — AB (ref <50)
Hydromorphone: 91 ng/mL (ref <50)
Morphine Urine: NEGATIVE ng/mL (ref <50)
NORHYDROCODONE, UR: NEGATIVE ng/mL — AB (ref <50)
Noroxycodone, Ur: NEGATIVE ng/mL (ref <50)
OXYMORPHONE, URINE: NEGATIVE ng/mL (ref <50)
Oxycodone, ur: NEGATIVE ng/mL (ref <50)

## 2014-11-10 LAB — MEPERIDINE (GC/LC/MS), URINE
Meperidine (GC/LC/MS), ur confirm: NEGATIVE ng/mL (ref <100)
Normeperidine (GC/LC/MS), ur confirm: NEGATIVE ng/mL (ref <100)

## 2014-11-11 LAB — PRESCRIPTION MONITORING PROFILE (SOLSTAS)
Amphetamine/Meth: NEGATIVE ng/mL
BARBITURATE SCREEN, URINE: NEGATIVE ng/mL
Benzodiazepine Screen, Urine: NEGATIVE ng/mL
Buprenorphine, Urine: NEGATIVE ng/mL
CANNABINOID SCRN UR: NEGATIVE ng/mL
CARISOPRODOL, URINE: NEGATIVE ng/mL
COCAINE METABOLITES: NEGATIVE ng/mL
CREATININE, URINE: 54.03 mg/dL (ref >20.0)
Fentanyl, Ur: NEGATIVE ng/mL
MDMA URINE: NEGATIVE ng/mL
METHADONE SCREEN, URINE: NEGATIVE ng/mL
Nitrites, Initial: NEGATIVE ug/mL
OXYCODONE SCRN UR: NEGATIVE ng/mL
PH URINE, INITIAL: 7.2 pH (ref 4.5–8.9)
Propoxyphene: NEGATIVE ng/mL
TRAMADOL UR: NEGATIVE ng/mL
Tapentadol, urine: NEGATIVE ng/mL
Zolpidem, Urine: NEGATIVE ng/mL

## 2014-11-18 ENCOUNTER — Other Ambulatory Visit: Payer: Self-pay | Admitting: Physical Medicine & Rehabilitation

## 2014-11-19 ENCOUNTER — Telehealth: Payer: Self-pay | Admitting: *Deleted

## 2014-11-19 NOTE — Telephone Encounter (Signed)
Derry Skill from [facility] called about this patient.  Said she is not able to tolerate very PT and would like to recommend Aqua therapy.  Please fax over written RX to [phone]

## 2014-11-20 ENCOUNTER — Telehealth: Payer: Self-pay | Admitting: *Deleted

## 2014-11-20 NOTE — Telephone Encounter (Signed)
Patient called and left message - in a lot of pain... Stated cannot do therapy, hurts too much.  Asking about Injections - and alternatives to help with her pain, please advise

## 2014-11-23 NOTE — Telephone Encounter (Signed)
I recommend injections cervical dystonia, Dysport 500 units, has comp approved yet?

## 2014-11-25 NOTE — Progress Notes (Signed)
Urine drug screen for this encounter is consistent for prescribed medication 

## 2014-12-03 ENCOUNTER — Encounter: Payer: Worker's Compensation | Attending: Physical Medicine & Rehabilitation | Admitting: Registered Nurse

## 2014-12-03 ENCOUNTER — Encounter: Payer: Self-pay | Admitting: Registered Nurse

## 2014-12-03 VITALS — BP 178/98 | HR 64 | Resp 14

## 2014-12-03 DIAGNOSIS — Z981 Arthrodesis status: Secondary | ICD-10-CM | POA: Diagnosis not present

## 2014-12-03 DIAGNOSIS — G8928 Other chronic postprocedural pain: Secondary | ICD-10-CM | POA: Diagnosis not present

## 2014-12-03 DIAGNOSIS — Z79891 Long term (current) use of opiate analgesic: Secondary | ICD-10-CM | POA: Diagnosis not present

## 2014-12-03 DIAGNOSIS — M79602 Pain in left arm: Secondary | ICD-10-CM | POA: Diagnosis not present

## 2014-12-03 DIAGNOSIS — M791 Myalgia: Secondary | ICD-10-CM

## 2014-12-03 DIAGNOSIS — M7062 Trochanteric bursitis, left hip: Secondary | ICD-10-CM

## 2014-12-03 DIAGNOSIS — Z79899 Other long term (current) drug therapy: Secondary | ICD-10-CM

## 2014-12-03 DIAGNOSIS — G894 Chronic pain syndrome: Secondary | ICD-10-CM

## 2014-12-03 DIAGNOSIS — M961 Postlaminectomy syndrome, not elsewhere classified: Secondary | ICD-10-CM | POA: Insufficient documentation

## 2014-12-03 DIAGNOSIS — Z760 Encounter for issue of repeat prescription: Secondary | ICD-10-CM | POA: Diagnosis not present

## 2014-12-03 DIAGNOSIS — G8929 Other chronic pain: Secondary | ICD-10-CM | POA: Diagnosis present

## 2014-12-03 DIAGNOSIS — M7918 Myalgia, other site: Secondary | ICD-10-CM

## 2014-12-03 DIAGNOSIS — M5412 Radiculopathy, cervical region: Secondary | ICD-10-CM | POA: Diagnosis not present

## 2014-12-03 DIAGNOSIS — M4722 Other spondylosis with radiculopathy, cervical region: Secondary | ICD-10-CM

## 2014-12-03 DIAGNOSIS — Z5181 Encounter for therapeutic drug level monitoring: Secondary | ICD-10-CM

## 2014-12-03 MED ORDER — METHYLPREDNISOLONE 4 MG PO KIT: PACK | ORAL | Status: DC

## 2014-12-03 MED ORDER — TIZANIDINE HCL 4 MG PO TABS: 4.0000 mg | ORAL_TABLET | Freq: Three times a day (TID) | ORAL | Status: DC

## 2014-12-03 MED ORDER — HYDROCODONE-ACETAMINOPHEN 5-325 MG PO TABS: 1.0000 | ORAL_TABLET | Freq: Three times a day (TID) | ORAL | Status: DC | PRN
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
## 2014-12-04 ENCOUNTER — Encounter: Payer: Self-pay | Admitting: Obstetrics & Gynecology

## 2014-12-04 ENCOUNTER — Ambulatory Visit (INDEPENDENT_AMBULATORY_CARE_PROVIDER_SITE_OTHER): Payer: Self-pay | Admitting: Obstetrics & Gynecology

## 2014-12-04 VITALS — BP 155/92 | HR 64 | Temp 97.9°F | Ht 64.0 in | Wt 138.3 lb

## 2014-12-04 DIAGNOSIS — N921 Excessive and frequent menstruation with irregular cycle: Secondary | ICD-10-CM | POA: Insufficient documentation

## 2014-12-04 DIAGNOSIS — D259 Leiomyoma of uterus, unspecified: Secondary | ICD-10-CM

## 2014-12-04 HISTORY — DX: Excessive and frequent menstruation with irregular cycle: N92.1

## 2014-12-04 LAB — POCT PREGNANCY, URINE: Preg Test, Ur: NEGATIVE

## 2014-12-04 LAB — FOLLICLE STIMULATING HORMONE: FSH: 95.1 m[IU]/mL
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
## 2014-12-07 ENCOUNTER — Telehealth: Payer: Self-pay

## 2014-12-07 LAB — GC/CHLAMYDIA PROBE AMP

## 2014-12-07 NOTE — Telephone Encounter (Signed)
Calling pt and left message to return call to the clinic.

## 2014-12-07 NOTE — Telephone Encounter (Signed)
Called pt and left message to return call to the clinic.  Re:  Pt needs to come in to redraw her wet prep.

## 2014-12-09 NOTE — Telephone Encounter (Signed)
Sent letter due to not being able to get in touch with patient.

## 2014-12-09 NOTE — Telephone Encounter (Signed)
Called pt and left message to return call.

## 2014-12-15 ENCOUNTER — Other Ambulatory Visit: Payer: Self-pay | Admitting: *Deleted

## 2014-12-15 MED ORDER — ALBUTEROL SULFATE HFA 108 (90 BASE) MCG/ACT IN AERS: 2.0000 | INHALATION_SPRAY | Freq: Four times a day (QID) | RESPIRATORY_TRACT | Status: DC | PRN

## 2014-12-21 ENCOUNTER — Other Ambulatory Visit: Payer: Self-pay | Admitting: Family Medicine

## 2015-01-04 ENCOUNTER — Ambulatory Visit: Payer: Self-pay | Admitting: Physical Medicine & Rehabilitation

## 2015-01-08 ENCOUNTER — Ambulatory Visit: Payer: Worker's Compensation | Admitting: Physical Medicine & Rehabilitation

## 2015-01-15 ENCOUNTER — Encounter: Payer: Self-pay | Admitting: Physical Medicine & Rehabilitation

## 2015-01-15 ENCOUNTER — Encounter: Payer: Worker's Compensation | Attending: Physical Medicine & Rehabilitation

## 2015-01-15 ENCOUNTER — Other Ambulatory Visit: Payer: Self-pay | Admitting: Physical Medicine & Rehabilitation

## 2015-01-15 ENCOUNTER — Ambulatory Visit (HOSPITAL_BASED_OUTPATIENT_CLINIC_OR_DEPARTMENT_OTHER): Payer: Worker's Compensation | Admitting: Physical Medicine & Rehabilitation

## 2015-01-15 VITALS — BP 172/99 | HR 71 | Resp 14

## 2015-01-15 DIAGNOSIS — M436 Torticollis: Secondary | ICD-10-CM | POA: Diagnosis not present

## 2015-01-15 DIAGNOSIS — M791 Myalgia: Secondary | ICD-10-CM | POA: Insufficient documentation

## 2015-01-15 DIAGNOSIS — Z5181 Encounter for therapeutic drug level monitoring: Secondary | ICD-10-CM | POA: Diagnosis not present

## 2015-01-15 DIAGNOSIS — Z79899 Other long term (current) drug therapy: Secondary | ICD-10-CM | POA: Insufficient documentation

## 2015-01-15 DIAGNOSIS — G894 Chronic pain syndrome: Secondary | ICD-10-CM | POA: Insufficient documentation

## 2015-01-15 DIAGNOSIS — M4722 Other spondylosis with radiculopathy, cervical region: Secondary | ICD-10-CM

## 2015-01-15 DIAGNOSIS — M961 Postlaminectomy syndrome, not elsewhere classified: Secondary | ICD-10-CM

## 2015-01-15 MED ORDER — HYDROCODONE-ACETAMINOPHEN 5-325 MG PO TABS: 1.0000 | ORAL_TABLET | Freq: Three times a day (TID) | ORAL | Status: DC | PRN
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
## 2015-01-15 NOTE — Patient Instructions (Signed)

## 2015-01-15 NOTE — Progress Notes (Signed)
Botox Injection for spasticity using needle EMG guidance  Dilution: 50 Units/ml Indication: Severe spasticity which interferes with ADL,mobility and/or  hygiene and is unresponsive to medication management and other conservative care Informed consent was obtained after describing risks and benefits of the procedure with the patient. This includes bleeding, bruising, infection, excessive weakness, or medication side effects. A REMS form is on file and signed. Needle: 27g 1" needle electrode Number of units per muscle  50 units right trapezius 50 units left trapezius 25 units left splenius capitis 50 units left longissimus 25 units left levator scapula All injections were done after obtaining appropriate EMG activity and after negative drawback for blood. The patient tolerated the procedure well. Post procedure instructions were given. A followup appointment was made.

## 2015-01-16 LAB — PMP ALCOHOL METABOLITE (ETG): Ethyl Glucuronide (EtG): NEGATIVE ng/mL

## 2015-01-19 LAB — OPIATES/OPIOIDS (LC/MS-MS)
CODEINE URINE: NEGATIVE ng/mL (ref <50)
Hydrocodone: 141 ng/mL (ref <50)
Hydromorphone: 142 ng/mL (ref <50)
MORPHINE: NEGATIVE ng/mL (ref <50)
NOROXYCODONE, UR: NEGATIVE ng/mL (ref <50)
Norhydrocodone, Ur: 354 ng/mL (ref <50)
OXYCODONE, UR: NEGATIVE ng/mL (ref <50)
Oxymorphone: NEGATIVE ng/mL (ref <50)

## 2015-01-20 LAB — PRESCRIPTION MONITORING PROFILE (SOLSTAS)
Amphetamine/Meth: NEGATIVE ng/mL
BENZODIAZEPINE SCREEN, URINE: NEGATIVE ng/mL
BUPRENORPHINE, URINE: NEGATIVE ng/mL
Barbiturate Screen, Urine: NEGATIVE ng/mL
CANNABINOID SCRN UR: NEGATIVE ng/mL
CREATININE, URINE: 70.21 mg/dL (ref >20.0)
Carisoprodol, Urine: NEGATIVE ng/mL
Cocaine Metabolites: NEGATIVE ng/mL
Fentanyl, Ur: NEGATIVE ng/mL
MDMA URINE: NEGATIVE ng/mL
Meperidine, Ur: NEGATIVE ng/mL
Methadone Screen, Urine: NEGATIVE ng/mL
NITRITES URINE, INITIAL: NEGATIVE ug/mL
OXYCODONE SCRN UR: NEGATIVE ng/mL
PH URINE, INITIAL: 7.2 pH (ref 4.5–8.9)
Propoxyphene: NEGATIVE ng/mL
TRAMADOL UR: NEGATIVE ng/mL
Tapentadol, urine: NEGATIVE ng/mL
Zolpidem, Urine: NEGATIVE ng/mL

## 2015-01-21 ENCOUNTER — Ambulatory Visit: Payer: Self-pay | Admitting: Obstetrics & Gynecology

## 2015-01-22 ENCOUNTER — Telehealth: Payer: Self-pay | Admitting: Family Medicine

## 2015-01-22 ENCOUNTER — Telehealth: Payer: Self-pay | Admitting: Physical Medicine & Rehabilitation

## 2015-01-22 ENCOUNTER — Other Ambulatory Visit: Payer: Self-pay | Admitting: Family Medicine

## 2015-01-22 NOTE — Telephone Encounter (Signed)
Pt called and needs a refill on her Trazodone. jw

## 2015-01-22 NOTE — Telephone Encounter (Signed)
Trazodone was refilled by another MD and I have told her this.  I also instructed her to use some heat on the site and it should start improving.  No redness or hot feeling at site so most likely related to needle injection and medication causing soreness which should resolve.  If she would like she can call on Monday to get an earlier appt if she feels she needs to be seen.

## 2015-01-22 NOTE — Telephone Encounter (Signed)
Completed.

## 2015-01-22 NOTE — Telephone Encounter (Signed)
Patient was given Botox and the injection site is swollen.  Also patient needs a refill on Trazodone.  Please call her at [phone].

## 2015-01-25 ENCOUNTER — Other Ambulatory Visit: Payer: Self-pay | Admitting: Family Medicine

## 2015-01-25 NOTE — Telephone Encounter (Signed)
I contacted pharmacy and got her Trazodone cut to 1 tab daily, she had done this in the past, might be time to cut this back. Patient need follow up with me to reassess for her insomnia.

## 2015-01-25 NOTE — Telephone Encounter (Signed)
Recently refilled.

## 2015-01-26 NOTE — Progress Notes (Signed)
Urine drug screen for this encounter is consistent for prescribed medication 

## 2015-02-03 ENCOUNTER — Telehealth: Payer: Self-pay | Admitting: *Deleted

## 2015-02-03 NOTE — Telephone Encounter (Signed)
Requesting call back.  She is having a great deal of pain in her back and is wondering what else can be done.

## 2015-02-08 NOTE — Telephone Encounter (Signed)
Attempted to reach Maria Griffith but no answer on home phone and unable to go through mobile number because of "unable to complete this call"  Message.  She has an appt with Dr Letta Pate this week.

## 2015-02-12 ENCOUNTER — Encounter: Payer: Self-pay | Admitting: Physical Medicine & Rehabilitation

## 2015-02-12 ENCOUNTER — Ambulatory Visit (HOSPITAL_BASED_OUTPATIENT_CLINIC_OR_DEPARTMENT_OTHER): Payer: Worker's Compensation | Admitting: Physical Medicine & Rehabilitation

## 2015-02-12 ENCOUNTER — Encounter: Payer: Worker's Compensation | Attending: Physical Medicine & Rehabilitation

## 2015-02-12 VITALS — BP 182/98 | HR 98 | Resp 14

## 2015-02-12 DIAGNOSIS — M436 Torticollis: Secondary | ICD-10-CM | POA: Diagnosis not present

## 2015-02-12 DIAGNOSIS — G894 Chronic pain syndrome: Secondary | ICD-10-CM | POA: Diagnosis present

## 2015-02-12 DIAGNOSIS — Z79899 Other long term (current) drug therapy: Secondary | ICD-10-CM | POA: Diagnosis not present

## 2015-02-12 DIAGNOSIS — M791 Myalgia: Secondary | ICD-10-CM | POA: Diagnosis not present

## 2015-02-12 DIAGNOSIS — Z5181 Encounter for therapeutic drug level monitoring: Secondary | ICD-10-CM | POA: Insufficient documentation

## 2015-02-12 MED ORDER — PREGABALIN 50 MG PO CAPS: 50.0000 mg | ORAL_CAPSULE | Freq: Two times a day (BID) | ORAL | Status: DC

## 2015-02-12 MED ORDER — HYDROCODONE-ACETAMINOPHEN 5-325 MG PO TABS: 1.0000 | ORAL_TABLET | Freq: Three times a day (TID) | ORAL | Status: DC | PRN

## 2015-02-12 MED ORDER — TIZANIDINE HCL 4 MG PO TABS: 4.0000 mg | ORAL_TABLET | Freq: Three times a day (TID) | ORAL | Status: DC

## 2015-02-12 MED ORDER — DICLOFENAC SODIUM 1 % TD GEL: 2.0000 g | Freq: Four times a day (QID) | TRANSDERMAL | Status: DC

## 2015-02-12 NOTE — Patient Instructions (Addendum)
If flareup last more than 2 days please call in for meloxicam prescription 7.5 mg per day 2 week supply  Use heat alternating with ice for flareups as well as Voltaren gel  Nurse practitioner visit one month  I will see every 6 months Unless repeat trigger point injections are needed  If headaches come back at around 3 month mark we can perhaps justify repeat Botox injection for the treatment of posttraumatic headaches.
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
## 2015-02-23 ENCOUNTER — Other Ambulatory Visit: Payer: Self-pay | Admitting: *Deleted

## 2015-02-24 MED ORDER — QUINAPRIL-HYDROCHLOROTHIAZIDE 20-12.5 MG PO TABS: 1.0000 | ORAL_TABLET | Freq: Every day | ORAL | Status: DC

## 2015-02-26 ENCOUNTER — Telehealth: Payer: Self-pay | Admitting: *Deleted

## 2015-02-26 NOTE — Telephone Encounter (Signed)
Received a fax from [facility] stating that they will no longer be getting Ditropan for free. Please consider changing to Toviaz 4 mg, Vesicare 5 mg or Detrol 2 mg/LA 2 mg caps.  Please advise.  Derl Barrow, RN

## 2015-03-01 ENCOUNTER — Telehealth: Payer: Self-pay | Admitting: Family Medicine

## 2015-03-01 MED ORDER — TOLTERODINE TARTRATE ER 4 MG PO CP24: 4.0000 mg | ORAL_CAPSULE | Freq: Every day | ORAL | Status: DC

## 2015-03-01 NOTE — Telephone Encounter (Signed)
Pharmacy does not carry Detropan anymore, will switch to Detrol. Please inform patient.

## 2015-03-01 NOTE — Telephone Encounter (Signed)
LM for patient to call back. Maria Griffith,CMA  

## 2015-03-11 ENCOUNTER — Encounter: Payer: Worker's Compensation | Attending: Physical Medicine & Rehabilitation | Admitting: Registered Nurse

## 2015-03-11 ENCOUNTER — Encounter: Payer: Self-pay | Admitting: Registered Nurse

## 2015-03-11 VITALS — BP 156/88 | HR 76 | Resp 14

## 2015-03-11 DIAGNOSIS — M791 Myalgia: Secondary | ICD-10-CM | POA: Diagnosis not present

## 2015-03-11 DIAGNOSIS — G8928 Other chronic postprocedural pain: Secondary | ICD-10-CM | POA: Insufficient documentation

## 2015-03-11 DIAGNOSIS — M961 Postlaminectomy syndrome, not elsewhere classified: Secondary | ICD-10-CM | POA: Insufficient documentation

## 2015-03-11 DIAGNOSIS — M5412 Radiculopathy, cervical region: Secondary | ICD-10-CM | POA: Insufficient documentation

## 2015-03-11 DIAGNOSIS — Z760 Encounter for issue of repeat prescription: Secondary | ICD-10-CM | POA: Insufficient documentation

## 2015-03-11 DIAGNOSIS — M7062 Trochanteric bursitis, left hip: Secondary | ICD-10-CM | POA: Insufficient documentation

## 2015-03-11 DIAGNOSIS — M7918 Myalgia, other site: Secondary | ICD-10-CM

## 2015-03-11 DIAGNOSIS — Z981 Arthrodesis status: Secondary | ICD-10-CM | POA: Insufficient documentation

## 2015-03-11 DIAGNOSIS — M4722 Other spondylosis with radiculopathy, cervical region: Secondary | ICD-10-CM

## 2015-03-11 DIAGNOSIS — M79602 Pain in left arm: Secondary | ICD-10-CM | POA: Diagnosis not present

## 2015-03-11 DIAGNOSIS — Z79899 Other long term (current) drug therapy: Secondary | ICD-10-CM

## 2015-03-11 DIAGNOSIS — Z79891 Long term (current) use of opiate analgesic: Secondary | ICD-10-CM | POA: Diagnosis not present

## 2015-03-11 DIAGNOSIS — Z5181 Encounter for therapeutic drug level monitoring: Secondary | ICD-10-CM

## 2015-03-11 DIAGNOSIS — G894 Chronic pain syndrome: Secondary | ICD-10-CM

## 2015-03-11 DIAGNOSIS — G8929 Other chronic pain: Secondary | ICD-10-CM | POA: Diagnosis present

## 2015-03-11 MED ORDER — TIZANIDINE HCL 4 MG PO TABS: 4.0000 mg | ORAL_TABLET | Freq: Three times a day (TID) | ORAL | Status: DC

## 2015-03-11 MED ORDER — HYDROCODONE-ACETAMINOPHEN 5-325 MG PO TABS: 1.0000 | ORAL_TABLET | Freq: Three times a day (TID) | ORAL | Status: DC | PRN

## 2015-03-11 MED ORDER — PREGABALIN 75 MG PO CAPS: 75.0000 mg | ORAL_CAPSULE | Freq: Two times a day (BID) | ORAL | Status: DC

## 2015-03-11 MED ORDER — MELOXICAM 7.5 MG PO TABS: 7.5000 mg | ORAL_TABLET | Freq: Every day | ORAL | Status: DC
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
## 2015-03-23 ENCOUNTER — Other Ambulatory Visit: Payer: Self-pay | Admitting: *Deleted

## 2015-03-24 MED ORDER — QUINAPRIL-HYDROCHLOROTHIAZIDE 20-12.5 MG PO TABS: 1.0000 | ORAL_TABLET | Freq: Every day | ORAL | Status: DC

## 2015-03-26 ENCOUNTER — Telehealth: Payer: Self-pay | Admitting: *Deleted

## 2015-03-26 NOTE — Telephone Encounter (Signed)
Patient called - still having terrible headaches.  Has an appointment on 04/25.  Wants something for the headaches.  Last phone message I told her that Dr. Naaman Plummer wants her to have an eye exam... Please call patient back.  See last call note

## 2015-03-30 NOTE — Telephone Encounter (Signed)
Attempted to reach out to Maria Griffith returning call but no answer  Left voicemail to call back if she still needs to speak with [place].Marland Kitchen

## 2015-04-01 ENCOUNTER — Ambulatory Visit: Payer: Self-pay

## 2015-04-08 ENCOUNTER — Ambulatory Visit: Payer: Self-pay

## 2015-04-12 ENCOUNTER — Ambulatory Visit: Payer: Self-pay

## 2015-04-13 ENCOUNTER — Encounter: Payer: Worker's Compensation | Attending: Physical Medicine & Rehabilitation

## 2015-04-13 ENCOUNTER — Encounter: Payer: Self-pay | Admitting: Physical Medicine & Rehabilitation

## 2015-04-13 ENCOUNTER — Other Ambulatory Visit: Payer: Self-pay | Admitting: Physical Medicine & Rehabilitation

## 2015-04-13 ENCOUNTER — Ambulatory Visit (HOSPITAL_BASED_OUTPATIENT_CLINIC_OR_DEPARTMENT_OTHER): Payer: Worker's Compensation | Admitting: Physical Medicine & Rehabilitation

## 2015-04-13 VITALS — HR 78 | Resp 14

## 2015-04-13 DIAGNOSIS — M4712 Other spondylosis with myelopathy, cervical region: Secondary | ICD-10-CM | POA: Insufficient documentation

## 2015-04-13 DIAGNOSIS — M436 Torticollis: Secondary | ICD-10-CM

## 2015-04-13 DIAGNOSIS — Z5181 Encounter for therapeutic drug level monitoring: Secondary | ICD-10-CM | POA: Insufficient documentation

## 2015-04-13 DIAGNOSIS — Z79899 Other long term (current) drug therapy: Secondary | ICD-10-CM

## 2015-04-13 MED ORDER — HYDROCODONE-ACETAMINOPHEN 5-325 MG PO TABS: 1.0000 | ORAL_TABLET | Freq: Three times a day (TID) | ORAL | Status: DC | PRN

## 2015-04-13 NOTE — Progress Notes (Signed)
Botox Injection for spasticity using needle EMG guidance  Dilution: 50 Units/ml Indication: Severe spasticity which interferes with ADL,mobility and/or  hygiene and is unresponsive to medication management and other conservative care Informed consent was obtained after describing risks and benefits of the procedure with the patient. This includes bleeding, bruising, infection, excessive weakness, or medication side effects. A REMS form is on file and signed. Needle: 27g 1" needle electrode Number of units per muscle  50 units right trapezius 50 units left trapezius 25 units left splenius capitis 50 units left longissimus 25 units left levator scapula All injections were done after obtaining appropriate EMG activity and after negative drawback for blood. The patient tolerated the procedure well. Post procedure instructions were given. A followup appointment was made.

## 2015-04-13 NOTE — Progress Notes (Signed)
Meeting with case manager to discuss patient's medications. Patient is complaining of memory issues. She is wondering if her medications can cause this. This has been going on for a while. She did have a slight increase in her Lyrica dose last month but this started before that time. We discussed that Lyrica, hydrocodone, Zanaflex can all affect memory, cause sedation and reduced level of alertness   Discussed the duration of Botox Discussed potential medication changes  Treatment alternatives to Zanaflex include Skelaxin Treatment alternatives to hydrocodone include Bu trans-patch  Over half of the 15 min visit was spent counseling and coordinating care.  One-month follow-up with nurse practitioner  Repeat botulinum toxin injection in 3 months if this current injection was helpful.

## 2015-04-13 NOTE — Patient Instructions (Signed)
OnabotulinumtoxinA injection (Medical Use) What is this medicine? ONABOTULINUMTOXINA (o na BOTT you lye num tox in eh) is a neuro-muscular blocker. This medicine is used to treat crossed eyes, eyelid spasms, severe neck muscle spasms, and elbow, wrist, and finger muscle spasms. It is also used to treat excessive underarm sweating, to prevent chronic migraine headaches, and to treat loss of bladder control due to neurologic conditions such as multiple sclerosis or spinal cord injury. This medicine may be used for other purposes; ask your health care provider or pharmacist if you have questions. COMMON BRAND NAME(S): Botox What should I tell my health care provider before I take this medicine? They need to know if you have any of these conditions: -breathing problems -cerebral palsy spasms -difficulty urinating -heart problems -history of surgery where this medicine is going to be used -infection at the site where this medicine is going to be used -myasthenia gravis or other neurologic disease -nerve or muscle disease -surgery plans -take medicines that treat or prevent blood clots -thyroid problems -an unusual or allergic reaction to botulinum toxin, albumin, other medicines, foods, dyes, or preservatives -pregnant or trying to get pregnant -breast-feeding How should I use this medicine? This medicine is for injection into a muscle. It is given by a health care professional in a hospital or clinic setting. Talk to your pediatrician regarding the use of this medicine in children. While this drug may be prescribed for children as young as 63 years old for selected conditions, precautions do apply. Overdosage: If you think you have taken too much of this medicine contact a poison control center or emergency room at once. NOTE: This medicine is only for you. Do not share this medicine with others. What if I miss a dose? This does not apply. What may interact with this  medicine? -aminoglycoside antibiotics like gentamicin, neomycin, tobramycin -muscle relaxants -other botulinum toxin injections This list may not describe all possible interactions. Give your health care provider a list of all the medicines, herbs, non-prescription drugs, or dietary supplements you use. Also tell them if you smoke, drink alcohol, or use illegal drugs. Some items may interact with your medicine. What should I watch for while using this medicine? Visit your doctor for regular check ups. This medicine will cause weakness in the muscle where it is injected. Tell your doctor if you feel unusually weak in other muscles. Get medical help right away if you have problems with breathing, swallowing, or talking. This medicine might make your eyelids droop or make you see blurry or double. If you have weak muscles or trouble seeing do not drive a car, use machinery, or do other dangerous activities. This medicine contains albumin from human blood. It may be possible to pass an infection in this medicine, but no cases have been reported. Talk to your doctor about the risks and benefits of this medicine. If your activities have been limited by your condition, go back to your regular routine slowly after treatment with this medicine. What side effects may I notice from receiving this medicine? Side effects that you should report to your doctor or health care professional as soon as possible: -allergic reactions like skin rash, itching or hives, swelling of the face, lips, or tongue -breathing problems -changes in vision -chest pain or tightness -eye irritation, pain -fast, irregular heartbeat -infection -numbness -speech problems -swallowing problems -unusual weakness Side effects that usually do not require medical attention (report to your doctor or health care professional if they continue or  are bothersome): -bruising or pain at site where injected -drooping eyelid -dry eyes or  mouth -headache -muscles aches, pains -sensitivity to light -tearing This list may not describe all possible side effects. Call your doctor for medical advice about side effects. You may report side effects to FDA at 1-800-FDA-1088. Where should I keep my medicine? This drug is given in a hospital or clinic and will not be stored at home. NOTE: This sheet is a summary. It may not cover all possible information. If you have questions about this medicine, talk to your doctor, pharmacist, or health care provider.  2015, Elsevier/Gold Standard. (2012-09-02 17:30:24)

## 2015-04-14 LAB — PMP ALCOHOL METABOLITE (ETG): Ethyl Glucuronide (EtG): NEGATIVE ng/mL

## 2015-04-16 LAB — OPIATES/OPIOIDS (LC/MS-MS)
CODEINE URINE: NEGATIVE ng/mL (ref <50)
Hydrocodone: 289 ng/mL (ref <50)
Hydromorphone: 258 ng/mL (ref <50)
Morphine Urine: NEGATIVE ng/mL (ref <50)
Norhydrocodone, Ur: 757 ng/mL (ref <50)
Noroxycodone, Ur: NEGATIVE ng/mL (ref <50)
Oxycodone, ur: NEGATIVE ng/mL (ref <50)
Oxymorphone: NEGATIVE ng/mL (ref <50)

## 2015-04-17 LAB — PRESCRIPTION MONITORING PROFILE (SOLSTAS)
AMPHETAMINE/METH: NEGATIVE ng/mL
Barbiturate Screen, Urine: NEGATIVE ng/mL
Benzodiazepine Screen, Urine: NEGATIVE ng/mL
Buprenorphine, Urine: NEGATIVE ng/mL
CANNABINOID SCRN UR: NEGATIVE ng/mL
CARISOPRODOL, URINE: NEGATIVE ng/mL
CREATININE, URINE: 198.52 mg/dL (ref >20.0)
Cocaine Metabolites: NEGATIVE ng/mL
ECSTASY: NEGATIVE ng/mL
Fentanyl, Ur: NEGATIVE ng/mL
MEPERIDINE UR: NEGATIVE ng/mL
METHADONE SCREEN, URINE: NEGATIVE ng/mL
Nitrites, Initial: NEGATIVE ug/mL
OXYCODONE SCRN UR: NEGATIVE ng/mL
Propoxyphene: NEGATIVE ng/mL
TAPENTADOLUR: NEGATIVE ng/mL
Tramadol Scrn, Ur: NEGATIVE ng/mL
Zolpidem, Urine: NEGATIVE ng/mL
pH, Initial: 5.4 pH (ref 4.5–8.9)

## 2015-04-20 ENCOUNTER — Ambulatory Visit: Payer: Self-pay

## 2015-04-22 ENCOUNTER — Ambulatory Visit: Payer: Self-pay

## 2015-04-22 ENCOUNTER — Other Ambulatory Visit: Payer: Self-pay | Admitting: *Deleted

## 2015-04-22 MED ORDER — ALBUTEROL SULFATE HFA 108 (90 BASE) MCG/ACT IN AERS: 2.0000 | INHALATION_SPRAY | Freq: Four times a day (QID) | RESPIRATORY_TRACT | Status: DC | PRN

## 2015-05-04 NOTE — Progress Notes (Signed)
Urine drug screen for this encounter is consistent for prescribed medication 

## 2015-05-07 ENCOUNTER — Encounter: Payer: Worker's Compensation | Attending: Physical Medicine & Rehabilitation

## 2015-05-07 ENCOUNTER — Encounter: Payer: Self-pay | Admitting: Physical Medicine & Rehabilitation

## 2015-05-07 ENCOUNTER — Ambulatory Visit (HOSPITAL_BASED_OUTPATIENT_CLINIC_OR_DEPARTMENT_OTHER): Payer: Worker's Compensation | Admitting: Physical Medicine & Rehabilitation

## 2015-05-07 VITALS — BP 152/94 | HR 68 | Resp 14

## 2015-05-07 DIAGNOSIS — G894 Chronic pain syndrome: Secondary | ICD-10-CM | POA: Insufficient documentation

## 2015-05-07 DIAGNOSIS — M436 Torticollis: Secondary | ICD-10-CM

## 2015-05-07 DIAGNOSIS — Z5181 Encounter for therapeutic drug level monitoring: Secondary | ICD-10-CM | POA: Diagnosis not present

## 2015-05-07 DIAGNOSIS — F32A Depression, unspecified: Secondary | ICD-10-CM

## 2015-05-07 DIAGNOSIS — M791 Myalgia: Secondary | ICD-10-CM | POA: Diagnosis not present

## 2015-05-07 DIAGNOSIS — Z79899 Other long term (current) drug therapy: Secondary | ICD-10-CM | POA: Insufficient documentation

## 2015-05-07 DIAGNOSIS — M4722 Other spondylosis with radiculopathy, cervical region: Secondary | ICD-10-CM

## 2015-05-07 DIAGNOSIS — F4310 Post-traumatic stress disorder, unspecified: Secondary | ICD-10-CM

## 2015-05-07 DIAGNOSIS — F329 Major depressive disorder, single episode, unspecified: Secondary | ICD-10-CM

## 2015-05-07 MED ORDER — HYDROCODONE-ACETAMINOPHEN 5-325 MG PO TABS: 1.0000 | ORAL_TABLET | Freq: Three times a day (TID) | ORAL | Status: DC | PRN

## 2015-05-07 MED ORDER — TRAZODONE HCL 150 MG PO TABS: 150.0000 mg | ORAL_TABLET | Freq: Every day | ORAL | Status: DC
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
## 2015-05-07 NOTE — Patient Instructions (Addendum)
Please call us with the make and model of the TENS unit that you have been using so we can order a replacement.  I filled out the handicap placard application that you need to take to the [facility]  We will increase trazodone to 150 mg at night  Will make referral to pain psychologist in [facility] Dr Beryle Lathe

## 2015-05-10 ENCOUNTER — Telehealth: Payer: Self-pay | Admitting: *Deleted

## 2015-05-10 DIAGNOSIS — M4722 Other spondylosis with radiculopathy, cervical region: Secondary | ICD-10-CM

## 2015-05-10 DIAGNOSIS — M436 Torticollis: Secondary | ICD-10-CM

## 2015-05-10 NOTE — Telephone Encounter (Signed)
Maria Griffith called and said that her TENS Unit was obtained through [employer]. She said that Dr Maria Griffith requested this information. Their number is [phone]  When I googled the number it shows [facility] in [place].  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

## 2015-05-10 NOTE — Telephone Encounter (Signed)
Please submit order forMY [employer] AA. This will obviously require workers comp approval. May need to contact the case manager

## 2015-05-12 ENCOUNTER — Ambulatory Visit: Payer: Self-pay

## 2015-05-12 NOTE — Telephone Encounter (Signed)
Order placed and faxed to [name] CM [phone] who will direct it on to [facility] [employer].

## 2015-05-13 ENCOUNTER — Ambulatory Visit: Payer: Self-pay

## 2015-05-14 ENCOUNTER — Telehealth: Payer: Self-pay | Admitting: *Deleted

## 2015-05-14 NOTE — Telephone Encounter (Signed)
Prior auth for Trazodone initiated through [employer]

## 2015-05-20 ENCOUNTER — Other Ambulatory Visit: Payer: Self-pay | Admitting: Registered Nurse

## 2015-05-27 ENCOUNTER — Encounter: Payer: Self-pay | Admitting: Registered Nurse

## 2015-05-27 ENCOUNTER — Encounter: Payer: Worker's Compensation | Attending: Physical Medicine & Rehabilitation | Admitting: Registered Nurse

## 2015-05-27 VITALS — BP 140/87 | HR 75 | Resp 16

## 2015-05-27 DIAGNOSIS — G894 Chronic pain syndrome: Secondary | ICD-10-CM

## 2015-05-27 DIAGNOSIS — F329 Major depressive disorder, single episode, unspecified: Secondary | ICD-10-CM | POA: Diagnosis not present

## 2015-05-27 DIAGNOSIS — M5412 Radiculopathy, cervical region: Secondary | ICD-10-CM | POA: Insufficient documentation

## 2015-05-27 DIAGNOSIS — M4722 Other spondylosis with radiculopathy, cervical region: Secondary | ICD-10-CM | POA: Diagnosis not present

## 2015-05-27 DIAGNOSIS — Z5181 Encounter for therapeutic drug level monitoring: Secondary | ICD-10-CM

## 2015-05-27 DIAGNOSIS — F32A Depression, unspecified: Secondary | ICD-10-CM

## 2015-05-27 DIAGNOSIS — Z79899 Other long term (current) drug therapy: Secondary | ICD-10-CM

## 2015-05-27 DIAGNOSIS — G8929 Other chronic pain: Secondary | ICD-10-CM | POA: Diagnosis present

## 2015-05-27 DIAGNOSIS — M79602 Pain in left arm: Secondary | ICD-10-CM | POA: Insufficient documentation

## 2015-05-27 DIAGNOSIS — Z79891 Long term (current) use of opiate analgesic: Secondary | ICD-10-CM | POA: Diagnosis not present

## 2015-05-27 DIAGNOSIS — Z981 Arthrodesis status: Secondary | ICD-10-CM | POA: Diagnosis not present

## 2015-05-27 DIAGNOSIS — G8928 Other chronic postprocedural pain: Secondary | ICD-10-CM | POA: Insufficient documentation

## 2015-05-27 DIAGNOSIS — M7062 Trochanteric bursitis, left hip: Secondary | ICD-10-CM | POA: Diagnosis not present

## 2015-05-27 DIAGNOSIS — M961 Postlaminectomy syndrome, not elsewhere classified: Secondary | ICD-10-CM | POA: Insufficient documentation

## 2015-05-27 DIAGNOSIS — Z760 Encounter for issue of repeat prescription: Secondary | ICD-10-CM | POA: Diagnosis not present

## 2015-05-27 MED ORDER — TIZANIDINE HCL 4 MG PO TABS: 4.0000 mg | ORAL_TABLET | Freq: Three times a day (TID) | ORAL | Status: DC

## 2015-05-27 MED ORDER — HYDROCODONE-ACETAMINOPHEN 5-325 MG PO TABS: 1.0000 | ORAL_TABLET | Freq: Three times a day (TID) | ORAL | Status: DC | PRN
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
## 2015-06-08 ENCOUNTER — Telehealth: Payer: Self-pay | Admitting: *Deleted

## 2015-06-08 NOTE — Telephone Encounter (Signed)
Pt called asking for you to call back regarding her conversation with you about a referral for a scooter or wheelchair

## 2015-06-08 NOTE — Telephone Encounter (Signed)
Return the call regarding her motorized wheelchair, all questions answered.

## 2015-06-09 ENCOUNTER — Other Ambulatory Visit: Payer: Self-pay | Admitting: Physical Medicine & Rehabilitation

## 2015-06-09 ENCOUNTER — Other Ambulatory Visit: Payer: Self-pay | Admitting: *Deleted

## 2015-06-09 ENCOUNTER — Telehealth: Payer: Self-pay | Admitting: *Deleted

## 2015-06-09 ENCOUNTER — Other Ambulatory Visit: Payer: Self-pay | Admitting: Registered Nurse

## 2015-06-09 MED ORDER — TRAZODONE HCL 150 MG PO TABS: 150.0000 mg | ORAL_TABLET | Freq: Every day | ORAL | Status: DC

## 2015-06-09 MED ORDER — POLYETHYLENE GLYCOL 3350 17 GM/SCOOP PO POWD: 1.0000 | Freq: Once | ORAL | Status: DC

## 2015-06-09 NOTE — Telephone Encounter (Signed)
Recd electronic refill request for trazodone and miralax.  Sent in electronically

## 2015-06-09 NOTE — Telephone Encounter (Signed)
Allayne Butcher from [facility] called asking for a prescription/ order to be faxed to [phone] for patient to have an evaluation for a motorized wheelchair.  Faxing over order today.

## 2015-06-14 ENCOUNTER — Ambulatory Visit: Payer: Self-pay | Admitting: Registered Nurse

## 2015-06-15 ENCOUNTER — Other Ambulatory Visit: Payer: Self-pay | Admitting: Family Medicine

## 2015-06-15 ENCOUNTER — Other Ambulatory Visit: Payer: Self-pay

## 2015-06-15 DIAGNOSIS — Z1231 Encounter for screening mammogram for malignant neoplasm of breast: Secondary | ICD-10-CM

## 2015-06-15 NOTE — Telephone Encounter (Signed)
Patient had not been seen for over 1 year. I will give one time refill of her med. She need follow up for subsequent refill. Please inform patient.

## 2015-06-17 ENCOUNTER — Ambulatory Visit (HOSPITAL_COMMUNITY)
Admission: RE | Admit: 2015-06-17 | Discharge: 2015-06-17 | Disposition: A | Discharge status: Home / Self Care | Payer: Medicare Other | Source: Ambulatory Visit | Attending: Cardiovascular Disease | Admitting: Cardiovascular Disease

## 2015-06-17 ENCOUNTER — Ambulatory Visit (INDEPENDENT_AMBULATORY_CARE_PROVIDER_SITE_OTHER): Payer: Medicare Other | Admitting: Cardiovascular Disease

## 2015-06-17 ENCOUNTER — Encounter: Payer: Self-pay | Admitting: Cardiovascular Disease

## 2015-06-17 VITALS — BP 150/100 | HR 59 | Ht 64.0 in | Wt 167.4 lb

## 2015-06-17 DIAGNOSIS — I358 Other nonrheumatic aortic valve disorders: Secondary | ICD-10-CM | POA: Insufficient documentation

## 2015-06-17 DIAGNOSIS — R42 Dizziness and giddiness: Secondary | ICD-10-CM

## 2015-06-17 DIAGNOSIS — I351 Nonrheumatic aortic (valve) insufficiency: Secondary | ICD-10-CM | POA: Insufficient documentation

## 2015-06-17 DIAGNOSIS — I517 Cardiomegaly: Secondary | ICD-10-CM | POA: Diagnosis not present

## 2015-06-17 NOTE — Patient Instructions (Signed)
Medication Instructions:  Your physician recommends that you continue on your current medications as directed. Please refer to the Current Medication list given to you today.   Labwork: none  Testing/Procedures: Your physician has requested that you have an echocardiogram. Echocardiography is a painless test that uses sound waves to create images of your heart. It provides your doctor with information about the size and shape of your heart and how well your heart's chambers and valves are working. This procedure takes approximately one hour. There are no restrictions for this procedure.    Follow-Up: Your physician wants you to follow-up in: 2 years.  You will receive a reminder letter in the mail two months in advance. If you don't receive a letter, please call our office to schedule the follow-up appointment.
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
## 2015-06-18 ENCOUNTER — Ambulatory Visit (INDEPENDENT_AMBULATORY_CARE_PROVIDER_SITE_OTHER): Payer: Medicare Other | Admitting: Family Medicine

## 2015-06-18 ENCOUNTER — Other Ambulatory Visit: Payer: Self-pay | Admitting: *Deleted

## 2015-06-18 ENCOUNTER — Encounter: Payer: Self-pay | Admitting: Family Medicine

## 2015-06-18 ENCOUNTER — Other Ambulatory Visit (HOSPITAL_COMMUNITY)
Admission: RE | Admit: 2015-06-18 | Discharge: 2015-06-18 | Disposition: A | Discharge status: Home / Self Care | Payer: Medicare Other | Source: Ambulatory Visit | Attending: Family Medicine | Admitting: Family Medicine

## 2015-06-18 VITALS — BP 136/78 | HR 61 | Temp 98.1°F | Ht 63.0 in | Wt 163.5 lb

## 2015-06-18 DIAGNOSIS — Z0000 Encounter for general adult medical examination without abnormal findings: Secondary | ICD-10-CM

## 2015-06-18 DIAGNOSIS — Z124 Encounter for screening for malignant neoplasm of cervix: Secondary | ICD-10-CM | POA: Insufficient documentation

## 2015-06-18 DIAGNOSIS — Z1159 Encounter for screening for other viral diseases: Secondary | ICD-10-CM | POA: Diagnosis not present

## 2015-06-18 DIAGNOSIS — R1310 Dysphagia, unspecified: Secondary | ICD-10-CM

## 2015-06-18 DIAGNOSIS — I10 Essential (primary) hypertension: Secondary | ICD-10-CM

## 2015-06-18 DIAGNOSIS — Z114 Encounter for screening for human immunodeficiency virus [HIV]: Secondary | ICD-10-CM | POA: Diagnosis not present

## 2015-06-18 DIAGNOSIS — Z01419 Encounter for gynecological examination (general) (routine) without abnormal findings: Secondary | ICD-10-CM | POA: Diagnosis not present

## 2015-06-18 DIAGNOSIS — Z113 Encounter for screening for infections with a predominantly sexual mode of transmission: Secondary | ICD-10-CM | POA: Diagnosis not present

## 2015-06-18 DIAGNOSIS — Z1151 Encounter for screening for human papillomavirus (HPV): Secondary | ICD-10-CM | POA: Diagnosis not present

## 2015-06-18 HISTORY — DX: Dysphagia, unspecified: R13.10

## 2015-06-18 LAB — COMPREHENSIVE METABOLIC PANEL
ALT: 20 U/L (ref 0–35)
AST: 21 U/L (ref 0–37)
Albumin: 3.9 g/dL (ref 3.5–5.2)
Alkaline Phosphatase: 73 U/L (ref 39–117)
BILIRUBIN TOTAL: 0.4 mg/dL (ref 0.2–1.2)
BUN: 18 mg/dL (ref 6–23)
CO2: 28 mEq/L (ref 19–32)
CREATININE: 0.89 mg/dL (ref 0.50–1.10)
Calcium: 9.1 mg/dL (ref 8.4–10.5)
Chloride: 101 mEq/L (ref 96–112)
Glucose, Bld: 73 mg/dL (ref 70–99)
POTASSIUM: 3.8 meq/L (ref 3.5–5.3)
SODIUM: 140 meq/L (ref 135–145)
TOTAL PROTEIN: 7.1 g/dL (ref 6.0–8.3)

## 2015-06-18 LAB — LIPID PANEL
CHOLESTEROL: 163 mg/dL (ref 0–200)
HDL: 65 mg/dL (ref >=46)
LDL Cholesterol: 69 mg/dL (ref 0–99)
TRIGLYCERIDES: 145 mg/dL (ref <150)
Total CHOL/HDL Ratio: 2.5 Ratio
VLDL: 29 mg/dL (ref 0–40)

## 2015-06-18 MED ORDER — QUINAPRIL-HYDROCHLOROTHIAZIDE 20-12.5 MG PO TABS: 1.0000 | ORAL_TABLET | Freq: Every day | ORAL | Status: DC

## 2015-06-18 NOTE — Progress Notes (Signed)
Pt was not able to finish PHQ-9 due to PCP discharging pt before CMA could finish this. Kydan Shanholtzer, CMA.

## 2015-06-18 NOTE — Assessment & Plan Note (Signed)
PAP completed. HIV screening completed. GC/Chlamydia done at her request. Colonoscopy recommended. I referred her to GI. She has mammogram already scheduled. F/U as needed.

## 2015-06-18 NOTE — Progress Notes (Signed)
Patient ID: Maria Griffith, female   DOB: 08/09/1961, 54 y.o.   MRN: [id number] Subjective:      Maria Griffith is a 54 y.o. female and is here for a comprehensive physical exam. The patient reports problems - trouble swallowing since 2014 after intubated for surgery under general anesthesia.. Depression: Was seen by [facility] psychiatrist last Tuesday who did some counseling and advised she continues Trazodone at her current dose.  History   Social History  . Marital Status: Married    Spouse Name: N/A  . Number of Children: N/A  . Years of Education: N/A   Occupational History  . 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 Smoking status: Never Smoker   . Smokeless tobacco: Never Used  . Alcohol Use: No  . Drug Use: No  . Sexual Activity: Yes    Birth Control/ Protection: Pill   Other Topics Concern  . Not on file   Social History Narrative   Pt lives alone and is engaged to be married.   She notes some regular stressors in her life like paying bills.   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   Health Maintenance  Topic Date Due  . COLONOSCOPY  06/18/2012  . INFLUENZA VACCINE  07/19/2015  . PAP SMEAR  07/30/2015  . MAMMOGRAM  01/15/2016  . TETANUS/TDAP  03/08/2023  . HIV Screening  Completed    The following portions of the patient's history were reviewed and updated as appropriate: allergies, current medications, past family history, past medical history, past social history, past surgical history and problem list.  Review of Systems Pertinent items are noted in HPI.   Objective:    BP 136/78 mmHg  Pulse 61  Temp(Src) 98.1 F (36.7 C) (Oral)  Ht 5\' 3"  (1.6 m)  Wt 163 lb 8 oz (74.163 kg)  BMI 28.97 kg/m2 General appearance: alert and cooperative Head: Normocephalic, without obvious abnormality, atraumatic Eyes: conjunctivae/corneas clear. PERRL, EOM's intact. Fundi benign. Ears: normal TM's and external ear  canals both ears Throat: lips, mucosa, and tongue normal; teeth and gums normal Lungs: clear to auscultation bilaterally Heart: regular rate and rhythm, S1, S2 normal, no murmur, click, rub or gallop Abdomen: soft, non-tender; bowel sounds normal; no masses,  no organomegaly Pelvic: cervix normal in appearance, external genitalia normal, no adnexal masses or tenderness, no cervical motion tenderness, rectovaginal septum normal, uterus normal size, shape, and consistency and vagina normal without discharge Extremities: extremities normal, atraumatic, no cyanosis or edema. Brace on her left arm which she wears chronically for pain. Pulses: 2+ and symmetric Skin: Skin color, texture, turgor normal. No rashes or lesions Lymph nodes: Cervical, supraclavicular, and axillary nodes normal. Neurologic: Alert and oriented X 3, normal strength and tone. Normal symmetric reflexes. Normal coordination and gait    Assessment:    Healthy female exam.      Dysphagia Plan:     See After Visit Summary for Counseling Recommendations

## 2015-06-18 NOTE — Assessment & Plan Note (Signed)
For more than 2 yrs, worsening. GI referral for EGD.

## 2015-06-18 NOTE — Patient Instructions (Signed)
Pelvic Exam A pelvic exam is an exam of a woman's outer and inner genitals and reproductive organs. The first pelvic exam should be at age 54. Pelvic exams allow your health care provider to check on normal development and screen for health problems. Usually, a general physical exam is done first. An exam of the breasts is also done. At this visit, you can ask questions about your health, body, menstrual cycles, sex, and birth control methods. Your health care provider will also ask you questions about your health, family health, menstrual periods, immunizations, and if you are sexually active. The information shared between you and your health care provider is not shared with anyone else. WHAT ARE THE REASONS TO HAVE A PELVIC EXAM? One reason for a pelvic exam is to screen for cancer of the ovaries, uterus, and vagina (pelvic organs). Annual (once a year) pelvic exams to screen for cancer are no longer recommended for nonpregnant women who are considered low risk for cancer of the pelvic organs and who do not have symptoms. These are sometimes called Pap tests. Ask your health care provider if a screening pelvic exam is right for you. For low-risk women, pelvic exam cancer screening should be done:  Every 3 years, for women ages 21-29.  Every 5 years, for women ages 30-65.  Some women have medical problems that increase the chance of getting cervical cancer. Talk to your health care provider about these problems. It is especially important to talk to your health care provider if a new problem develops soon after your last Pap test. In these cases, your health care provider may recommend more frequent screening and Pap tests.  The above recommendations are the same for women who have or have not gotten the vaccine for HPV, or human papillomavirus.  If you had a complete hysterectomy for a problem that was not cancer or a condition that could lead to cancer, then you no longer need Pap tests. However,  even if you no longer need a Pap test, a regular exam is a good idea to make sure no other problems are starting.   If you have had past treatment for cervical cancer or a condition that could lead to cancer, you need annual Pap tests and screening for cancer for at least 20 years after your treatment.  If you are no longer receiving Pap tests, risk factors (such as a new sexual partner)need to be discussed with your health care provider to determine if screening should be started again. Other reasons your health care provider might recommend a pelvic exam could include:   If you are at high risk for cervical cancer.  To make sure your female organs are normal and functioning correctly.  To check on body changes that suggest a reproductive system cancer.  To explore why you are not able to get pregnant (infertility).  To find a cause for vaginal discharge, itching, or burning.  To look for causes of why you cannot hold your urine (urinary incontinence).  To look for causes of sexual problems.  To look for signs of sexually transmitted infection (STI).  To follow the progression of labor. Your health care provider can check on the baby and how far your cervix has opened.  To determine if pregnancy is present or how far advanced the pregnancy is.  If you have:  severe cramping or pain during your menstrual periods.  pain during sexual intercourse.  abnormal menstrual periods.  no menstrual period by the age   of 16. HOW IS A PELVIC EXAM PERFORMED?  A pelvic exam is usually painless but may cause mild discomfort. In unusual circumstances or in young girls, medicines may be used for comfort. A pelvic exam is not done routinely before a girl is sexually active. Special circumstances such as rape, trauma, or medical problems may require an exam. Below is what you can expect during a pelvic exam.  You will remove all your clothes and will be given a gown. Usually, there is a nurse  in the room during the exam and you can have someone from your family with you also.  The general physical exam will be done first.  Before the pelvic exam starts, you will lie down on your back on a special table. You will put the heels of your feet into foot rests (stirrups) with your legs apart. A gown, cloth, or paper drape is usually placed over your abdomen and legs.  First, the health care provider checks your outer genitals to make sure the arrangement of body parts is normal. This includes the clitoris, vaginal opening, hymen, labia, and the perineal area between the vagina and rectum. The labia are the skin folds surrounding the vaginal opening. The tube that carries urine (urethra) is also examined.  An internal exam is done next. First, the health care provider inserts an instrument called a speculum into the vagina. The speculum has lubricant on it. The speculum helps hold the vaginal walls apart. The health care provider can then examine the vagina and cervix, which is the opening to the uterus. Cultures of any discharge may be taken to check for an infection. A Pap test may be done.  After the internal exam is done, the speculum is removed. Your health care provider will use latex gloves with a lubricant on his or her fingers to gently press against various pelvic organs from inside the vagina while his or her other hand is on your lower belly. Your health care provider will note any tenderness or abnormalities.  Following the exam, you will get dressed and can speak with your health care provider.  Ask your health care provider when and how often you should return for future visits. Finding Out the Results of Your Test Ask when your test results will be ready. Make sure you get your test results. HOW DO I CONTINUE A HEALTHY LIFESTYLE?  Follow your health care provider's advice regarding follow-up and future visits.  Get the necessary immunizations according to your age and any  traveling you may do.  Eat a balanced, nourishing diet.  Get plenty of rest and sleep.  Exercise regularly.  Maintain a healthy weight.  Do not smoke or take illegal drugs.  Drink alcohol in moderation or not at all.  If you are sexually active, use some form of birth control if you do not plan to get pregnant.  If you are sexually active, practice safe sex by using a condom to protect against sexually transmitted disease (STD).  Get help or counseling if you have emotional problems. Document Released: 02/24/2003 Document Revised: 12/09/2013 Document Reviewed: 03/02/2010 [employer] Patient Information 2015 [employer]. This information is not intended to replace advice given to you by your health care provider. Make sure you discuss any questions you have with your health care provider.  

## 2015-06-19 LAB — HIV ANTIBODY (ROUTINE TESTING W REFLEX): HIV 1&2 Ab, 4th Generation: NONREACTIVE

## 2015-06-22 ENCOUNTER — Telehealth: Payer: Self-pay | Admitting: Family Medicine

## 2015-06-22 NOTE — Telephone Encounter (Signed)
Discussed normal lab result with patient. PAP and GC/Chlamydia still pending.

## 2015-06-23 ENCOUNTER — Telehealth: Payer: Self-pay | Admitting: Family Medicine

## 2015-06-23 ENCOUNTER — Ambulatory Visit
Admission: RE | Admit: 2015-06-23 | Discharge: 2015-06-23 | Disposition: A | Discharge status: Home / Self Care | Payer: Medicare Other | Source: Ambulatory Visit

## 2015-06-23 DIAGNOSIS — Z1231 Encounter for screening mammogram for malignant neoplasm of breast: Secondary | ICD-10-CM

## 2015-06-23 LAB — CYTOLOGY - PAP

## 2015-06-23 NOTE — Telephone Encounter (Signed)
PAP result discussed with patient. Normal except for positive HPV. I recommended colposcopy. She will call to schedule appointment at the [facility] clinic.

## 2015-06-24 ENCOUNTER — Encounter: Payer: Self-pay | Admitting: Family Medicine

## 2015-06-25 ENCOUNTER — Encounter: Payer: Self-pay | Admitting: Internal Medicine

## 2015-06-25 ENCOUNTER — Ambulatory Visit (INDEPENDENT_AMBULATORY_CARE_PROVIDER_SITE_OTHER): Payer: Medicare Other | Admitting: Internal Medicine

## 2015-06-25 VITALS — BP 108/68 | HR 76 | Ht 61.5 in | Wt 165.2 lb

## 2015-06-25 DIAGNOSIS — R05 Cough: Secondary | ICD-10-CM

## 2015-06-25 DIAGNOSIS — R1310 Dysphagia, unspecified: Secondary | ICD-10-CM

## 2015-06-25 DIAGNOSIS — R059 Cough, unspecified: Secondary | ICD-10-CM

## 2015-06-25 DIAGNOSIS — Z1211 Encounter for screening for malignant neoplasm of colon: Secondary | ICD-10-CM | POA: Diagnosis not present

## 2015-06-25 MED ORDER — OMEPRAZOLE 40 MG PO CPDR: 40.0000 mg | DELAYED_RELEASE_CAPSULE | Freq: Every day | ORAL | Status: DC
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
## 2015-06-25 NOTE — Patient Instructions (Addendum)
We have sent the following medications to your pharmacy for you to pick up at your convenience:  Prilosec  You have been scheduled for a Barium Esophogram at [facility] (1st floor of the hospital) on 07/01/2015 at 11:00am. Please arrive 15 minutes prior to your appointment for registration. Make certain not to have anything to eat or drink 3 hours prior to your test. If you need to reschedule for any reason, please contact radiology at [phone] to do so. __________________________________________________________________ A barium swallow is an examination that concentrates on views of the esophagus. This tends to be a double contrast exam (barium and two liquids which, when combined, create a gas to distend the wall of the oesophagus) or single contrast (non-ionic iodine based). The study is usually tailored to your symptoms so a good history is essential. Attention is paid during the study to the form, structure and configuration of the esophagus, looking for functional disorders (such as aspiration, dysphagia, achalasia, motility and reflux) EXAMINATION You may be asked to change into a gown, depending on the type of swallow being performed. A radiologist and radiographer will perform the procedure. The radiologist will advise you of the type of contrast selected for your procedure and direct you during the exam. You will be asked to stand, sit or lie in several different positions and to hold a small amount of fluid in your mouth before being asked to swallow while the imaging is performed .In some instances you may be asked to swallow barium coated marshmallows to assess the motility of a solid food bolus. The exam can be recorded as a digital or video fluoroscopy procedure. POST PROCEDURE It will take 1-2 days for the barium to pass through your system. To facilitate this, it is important, unless otherwise directed, to increase your fluids for the next 24-48hrs and to resume your normal  diet.  This test typically takes about 30 minutes to perform.   __________________________________________________________________________________  I will call you with a referral appointment to [facility]  Dr Tawanna Solo

## 2015-06-29 ENCOUNTER — Telehealth: Payer: Self-pay | Admitting: Family Medicine

## 2015-06-29 NOTE — Telephone Encounter (Signed)
I called patient to find out what her question is about. She was contacted by me about +HPV on PAP and I recommended she scheduled appointment at the [facility] at the time. She stated she was not given any medication for HPV and I advised her she does not need medications for HPV. I again advised she call to schedule an appointment at the [facility] and she agreed to do so.

## 2015-06-29 NOTE — Telephone Encounter (Signed)
Pt called because she was seen and diagnosed but not given a treatment plan. She wanted to know what she is suppose to do. jw

## 2015-06-29 NOTE — Telephone Encounter (Signed)
Will forward to MD. Jazmin Hartsell,CMA  

## 2015-07-01 ENCOUNTER — Ambulatory Visit (HOSPITAL_COMMUNITY)
Admission: RE | Admit: 2015-07-01 | Discharge: 2015-07-01 | Disposition: A | Discharge status: Home / Self Care | Payer: Medicare Other | Source: Ambulatory Visit | Attending: Internal Medicine | Admitting: Internal Medicine

## 2015-07-01 ENCOUNTER — Encounter: Payer: Self-pay | Admitting: Family Medicine

## 2015-07-01 ENCOUNTER — Telehealth: Payer: Self-pay

## 2015-07-01 DIAGNOSIS — R1310 Dysphagia, unspecified: Secondary | ICD-10-CM

## 2015-07-01 DIAGNOSIS — K224 Dyskinesia of esophagus: Secondary | ICD-10-CM | POA: Insufficient documentation

## 2015-07-01 DIAGNOSIS — R0989 Other specified symptoms and signs involving the circulatory and respiratory systems: Secondary | ICD-10-CM | POA: Diagnosis not present

## 2015-07-01 NOTE — Telephone Encounter (Signed)
Scheduled appointment via Kalman Shan at [facility] with Dr. Constance Holster per Dr. Olevia Perches to evaluate patient's hoarseness and rasipiness following cervical spine injury.  Spoke with patient and gave her appointment information - 07/05/2015 at 2:30.  Arrive at 2:10 with insurance card and med list.  I gave her the address and phone number of the office.  Patient acknowledged and understood

## 2015-07-02 ENCOUNTER — Ambulatory Visit (HOSPITAL_BASED_OUTPATIENT_CLINIC_OR_DEPARTMENT_OTHER): Payer: Worker's Compensation | Admitting: Physical Medicine & Rehabilitation

## 2015-07-02 ENCOUNTER — Encounter: Payer: Self-pay | Admitting: Physical Medicine & Rehabilitation

## 2015-07-02 ENCOUNTER — Encounter: Payer: Worker's Compensation | Attending: Physical Medicine & Rehabilitation

## 2015-07-02 VITALS — BP 142/80 | HR 68 | Resp 16

## 2015-07-02 DIAGNOSIS — Z5181 Encounter for therapeutic drug level monitoring: Secondary | ICD-10-CM | POA: Diagnosis not present

## 2015-07-02 DIAGNOSIS — G894 Chronic pain syndrome: Secondary | ICD-10-CM | POA: Insufficient documentation

## 2015-07-02 DIAGNOSIS — M436 Torticollis: Secondary | ICD-10-CM | POA: Diagnosis not present

## 2015-07-02 DIAGNOSIS — M791 Myalgia: Secondary | ICD-10-CM | POA: Diagnosis not present

## 2015-07-02 DIAGNOSIS — Z79899 Other long term (current) drug therapy: Secondary | ICD-10-CM | POA: Insufficient documentation

## 2015-07-02 MED ORDER — HYDROCODONE-ACETAMINOPHEN 5-325 MG PO TABS: 1.0000 | ORAL_TABLET | Freq: Three times a day (TID) | ORAL | Status: DC | PRN

## 2015-07-02 NOTE — Patient Instructions (Signed)
You received a Botox injection today. You may experience soreness at the needle injection sites. Please call us if any of the injection sites turns red after a couple days or if there is any drainage. You may experience muscle weakness as a result of Botox. This would improve with time but can take several weeks to improve. The Botox should start working in about one week. The Botox usually last 3 months. The injection can be repeated every 3 months as needed.  There is another medicine similar to Botox called Dysport that may last longer

## 2015-07-02 NOTE — Progress Notes (Signed)
   Subjective:    Patient ID: Maria Griffith, female    DOB: Nov 21, 1961, 54 y.o.   MRN: [id number]  HPI    Review of Systems    Botox Injection for spasticity using needle EMG guidance  Dilution: 50 Units/ml Indication: Severe spasticity which interferes with ADL,mobility and/or  hygiene and is unresponsive to medication management and other conservative care Informed consent was obtained after describing risks and benefits of the procedure with the patient. This includes bleeding, bruising, infection, excessive weakness, or medication side effects. A REMS form is on file and signed. Needle: 27g 1" needle electrode Number of units per muscle  50 units right trapezius 50 units left trapezius 25 units left splenius capitis 50 units left longissimus 25 units left levator scapula All injections were done after obtaining appropriate EMG activity and after negative drawback for blood. The patient tolerated the procedure well. Post procedure instructions were given. A followup appointment was made.   Objective:   Physical Exam        Assessment & Plan:

## 2015-07-05 DIAGNOSIS — M6289 Other specified disorders of muscle: Secondary | ICD-10-CM | POA: Diagnosis not present

## 2015-07-05 DIAGNOSIS — R490 Dysphonia: Secondary | ICD-10-CM | POA: Diagnosis not present

## 2015-07-05 DIAGNOSIS — J387 Other diseases of larynx: Secondary | ICD-10-CM | POA: Diagnosis not present

## 2015-07-10 ENCOUNTER — Other Ambulatory Visit: Payer: Self-pay | Admitting: Registered Nurse

## 2015-07-12 ENCOUNTER — Encounter: Payer: Self-pay | Admitting: Obstetrics & Gynecology

## 2015-07-12 ENCOUNTER — Ambulatory Visit (INDEPENDENT_AMBULATORY_CARE_PROVIDER_SITE_OTHER): Payer: Medicare Other | Admitting: Obstetrics & Gynecology

## 2015-07-12 VITALS — BP 128/84 | HR 82 | Temp 98.2°F | Ht 62.0 in | Wt 167.9 lb

## 2015-07-12 DIAGNOSIS — N951 Menopausal and female climacteric states: Secondary | ICD-10-CM | POA: Diagnosis not present

## 2015-07-12 DIAGNOSIS — D259 Leiomyoma of uterus, unspecified: Secondary | ICD-10-CM

## 2015-07-12 DIAGNOSIS — N3281 Overactive bladder: Secondary | ICD-10-CM

## 2015-07-12 MED ORDER — OXYBUTYNIN CHLORIDE ER 10 MG PO TB24: 20.0000 mg | ORAL_TABLET | Freq: Every day | ORAL | Status: DC

## 2015-07-12 NOTE — Progress Notes (Signed)
Scheduled appt for Dr. Maryland Pink, UroGYN @ [phone] on August 17th @ 330p.

## 2015-07-12 NOTE — Patient Instructions (Signed)
Urinary Incontinence Urinary incontinence is the involuntary loss of urine from your bladder. CAUSES  There are many causes of urinary incontinence. They include:  Medicines.  Infections.  Prostatic enlargement, leading to overflow of urine from your bladder.  Surgery.  Neurological diseases.  Emotional factors. SIGNS AND SYMPTOMS Urinary Incontinence can be divided into four types: 1. Urge incontinence. Urge incontinence is the involuntary loss of urine before you have the opportunity to go to the bathroom. There is a sudden urge to void but not enough time to reach a bathroom. 2. Stress incontinence. Stress incontinence is the sudden loss of urine with any activity that forces urine to pass. It is commonly caused by anatomical changes to the pelvis and sphincter areas of your body. 3. Overflow incontinence. Overflow incontinence is the loss of urine from an obstructed opening to your bladder. This results in a backup of urine and a resultant buildup of pressure within the bladder. When the pressure within the bladder exceeds the closing pressure of the sphincter, the urine overflows, which causes incontinence, similar to water overflowing a dam. 4. Total incontinence. Total incontinence is the loss of urine as a result of the inability to store urine within your bladder. DIAGNOSIS  Evaluating the cause of incontinence may require:  A thorough and complete medical and obstetric history.  A complete physical exam.  Laboratory tests such as a urine culture and sensitivities. When additional tests are indicated, they can include:  An ultrasound exam.  Kidney and bladder X-rays.  Cystoscopy. This is an exam of the bladder using a narrow scope.  Urodynamic testing to test the nerve function to the bladder and sphincter areas. TREATMENT  Treatment for urinary incontinence depends on the cause:  For urge incontinence caused by a bacterial infection, antibiotics will be prescribed.  If the urge incontinence is related to medicines you take, your health care provider may have you change the medicine.  For stress incontinence, surgery to re-establish anatomical support to the bladder or sphincter, or both, will often correct the condition.  For overflow incontinence caused by an enlarged prostate, an operation to open the channel through the enlarged prostate will allow the flow of urine out of the bladder. In women with fibroids, a hysterectomy may be recommended.  For total incontinence, surgery on your urinary sphincter may help. An artificial urinary sphincter (an inflatable cuff placed around the urethra) may be required. In women who have developed a hole-like passage between their bladder and vagina (vesicovaginal fistula), surgery to close the fistula often is required. HOME CARE INSTRUCTIONS  Normal daily hygiene and the use of pads or adult diapers that are changed regularly will help prevent odors and skin damage.  Avoid caffeine. It can overstimulate your bladder.  Use the bathroom regularly. Try about every 2-3 hours to go to the bathroom, even if you do not feel the need to do so. Take time to empty your bladder completely. After urinating, wait a minute. Then try to urinate again.  For causes involving nerve dysfunction, keep a log of the medicines you take and a journal of the times you go to the bathroom. SEEK MEDICAL CARE IF:  You experience worsening of pain instead of improvement in pain after your procedure.  Your incontinence becomes worse instead of better. SEE IMMEDIATE MEDICAL CARE IF:  You experience fever or shaking chills.  You are unable to pass your urine.  You have redness spreading into your groin or down into your thighs. MAKE SURE   YOU:   Understand these instructions.   Will watch your condition.  Will get help right away if you are not doing well or get worse. Document Released: 01/11/2005 Document Revised: 09/24/2013 Document  Reviewed: 05/13/2013 [facility] Patient Information 2015 [place], [place]. This information is not intended to replace advice given to you by your health care provider. Make sure you discuss any questions you have with your health care provider. Perimenopause Perimenopause is the time when your body begins to move into the menopause (no menstrual period for 12 straight months). It is a natural process. Perimenopause can begin 2-8 years before the menopause and usually lasts for 1 year after the menopause. During this time, your ovaries may or may not produce an egg. The ovaries vary in their production of estrogen and progesterone hormones each month. This can cause irregular menstrual periods, difficulty getting pregnant, vaginal bleeding between periods, and uncomfortable symptoms. CAUSES  Irregular production of the ovarian hormones, estrogen and progesterone, and not ovulating every month.  Other causes include:  Tumor of the pituitary gland in the brain.  Medical disease that affects the ovaries.  Radiation treatment.  Chemotherapy.  Unknown causes.  Heavy smoking and excessive alcohol intake can bring on perimenopause sooner. SIGNS AND SYMPTOMS  5. Hot flashes. 6. Night sweats. 7. Irregular menstrual periods. 8. Decreased sex drive. 9. Vaginal dryness. 10. Headaches. 11. Mood swings. 12. Depression. 13. Memory problems. 14. Irritability. 15. Tiredness. 16. Weight gain. 75. Trouble getting pregnant. 18. The beginning of losing bone cells (osteoporosis). 19. The beginning of hardening of the arteries (atherosclerosis). DIAGNOSIS  Your health care provider will make a diagnosis by analyzing your age, menstrual history, and symptoms. He or she will do a physical exam and note any changes in your body, especially your female organs. Female hormone tests may or may not be helpful depending on the amount of female hormones you produce and when you produce them. However, other  hormone tests may be helpful to rule out other problems. TREATMENT  In some cases, no treatment is needed. The decision on whether treatment is necessary during the perimenopause should be made by you and your health care provider based on how the symptoms are affecting you and your lifestyle. Various treatments are available, such as:  Treating individual symptoms with a specific medicine for that symptom.  Herbal medicines that can help specific symptoms.  Counseling.  Group therapy. HOME CARE INSTRUCTIONS   Keep track of your menstrual periods (when they occur, how heavy they are, how long between periods, and how long they last) as well as your symptoms and when they started.  Only take over-the-counter or prescription medicines as directed by your health care provider.  Sleep and rest.  Exercise.  Eat a diet that contains calcium (good for your bones) and soy (acts like the estrogen hormone).  Do not smoke.  Avoid alcoholic beverages.  Take vitamin supplements as recommended by your health care provider. Taking vitamin E may help in certain cases.  Take calcium and vitamin D supplements to help prevent bone loss.  Group therapy is sometimes helpful.  Acupuncture may help in some cases. SEEK MEDICAL CARE IF:   You have questions about any symptoms you are having.  You need a referral to a specialist (gynecologist, psychiatrist, or psychologist). SEEK IMMEDIATE MEDICAL CARE IF:   You have vaginal bleeding.  Your period lasts longer than 8 days.  Your periods are recurring sooner than 21 days.  You have bleeding after  intercourse.  You have severe depression.  You have pain when you urinate.  You have severe headaches.  You have vision problems. Document Released: 01/11/2005 Document Revised: 09/24/2013 Document Reviewed: 07/03/2013 [facility] Patient Information 2015 [place], [place]. This information is not intended to replace advice given to you by your  health care provider. Make sure you discuss any questions you have with your health care provider.

## 2015-07-12 NOTE — Progress Notes (Signed)
Patient ID: Maria Griffith, female   DOB: 1961/02/06, 54 y.o.   MRN: [id number] History:  54 y.o. G3P3003 here today for eval of fibroids. She reports LMP 6 months prev.  Pt c/o hot flushes.  She also reports that her incontinence has gotten worse.  She reports leakage of urine with coughing or any stress.  Needs to wear pads at all times now.  Pt reports lower abd pain near navel.      The following portions of the patient's history were reviewed and updated as appropriate: allergies, current medications, past family history, past medical history, past social history, past surgical history and problem list.  Review of Systems:  Pertinent items are noted in HPI.  Objective:  Physical Exam Blood pressure 128/84, pulse 82, temperature 98.2 F (36.8 C), temperature source Oral, height 5\' 2"  (1.575 m), weight 167 lb 14.4 oz (76.159 kg). Gen: NAD Abd: Soft, nontender and nondistended Pelvic: exam declined.  Pt s/p PAP 06/18/2015  Labs and Imaging 10/2014 CLINICAL DATA: Vaginal bleeding, uterine fibroid  EXAM: TRANSABDOMINAL AND TRANSVAGINAL ULTRASOUND OF PELVIS  TECHNIQUE: Both transabdominal and transvaginal ultrasound examinations of the pelvis were performed. Transabdominal technique was performed for global imaging of the pelvis including uterus, ovaries, adnexal regions, and pelvic cul-de-sac. It was necessary to proceed with endovaginal exam following the transabdominal exam to visualize the endometrium and ovaries.  COMPARISON: 07/18/2013  FINDINGS: Uterus  Measurements: 14.3 x 10.9 x 9.0 cm. Anteverted, anteflexed. Multiple fibroids are identified with dominant fibroids as follows:  Right lateral uterine body intramural/submucosal fibroid, 7.2 x 6.9 x 5.9 cm.  Mid posterior uterine body, intramural, 2.8 x 2.7 x 2.4 cm.  Left lateral uterine body, intramural, 2.8 x 2.7 x 2.0 cm  Endometrium  Thickness: 1.5 cm, uniformly echogenic but partly obscured  by fibroids. No focal abnormality visualized.  Right ovary  Measurements: 2.4 x 2.0 x 1.3 cm. Normal appearance/no adnexal mass.  Left ovary  Measurements: 3.0 x 2.0 x 1.0 cm. Normal appearance/no adnexal mass.  Other findings  No free fluid.  IMPRESSION: Fibroid uterus as above, with interval increase in size of dominant right intramural/ submucosal fibroid.  Assessment & Plan:  Worsening SUI- pt wants to try increasing meds.  Rec referral to UroGYN Ditropan XL 10mg  bid Referral to Dr. Etta Grandchild  Fibroid uterus: Discussed treatment options for fibroids but rec eval by UroGYN first.  Pt not interested in surgery at this time  Perimenopause: Pt with sx but, not interested in ERT after discussion.

## 2015-07-14 ENCOUNTER — Encounter: Payer: Self-pay | Admitting: Internal Medicine

## 2015-07-14 ENCOUNTER — Other Ambulatory Visit: Payer: Self-pay | Admitting: *Deleted

## 2015-07-14 MED ORDER — FLUTICASONE PROPIONATE HFA 220 MCG/ACT IN AERO
1.0000 | INHALATION_SPRAY | Freq: Two times a day (BID) | RESPIRATORY_TRACT | Status: DC
Start: 2015-07-14 — End: 2016-06-30

## 2015-07-15 ENCOUNTER — Ambulatory Visit (INDEPENDENT_AMBULATORY_CARE_PROVIDER_SITE_OTHER): Payer: Medicare Other | Admitting: Family Medicine

## 2015-07-15 VITALS — BP 161/102 | HR 73 | Temp 98.3°F | Ht 62.0 in | Wt <= 1120 oz

## 2015-07-15 DIAGNOSIS — R87619 Unspecified abnormal cytological findings in specimens from cervix uteri: Secondary | ICD-10-CM | POA: Insufficient documentation

## 2015-07-15 DIAGNOSIS — R87618 Other abnormal cytological findings on specimens from cervix uteri: Secondary | ICD-10-CM

## 2015-07-15 DIAGNOSIS — R87621 Atypical squamous cells cannot exclude high grade squamous intraepithelial lesion on cytologic smear of vagina (ASC-H): Secondary | ICD-10-CM | POA: Insufficient documentation

## 2015-07-15 NOTE — Patient Instructions (Signed)
Plan to follow up with your PCP as scheduled for routine care.  You will need to have your pap repeated with co-testing in 1 year.  Maria Griffith M. Lajuana Ripple, DO PGY-2, [facility]  Abnormal Pap Test Information During a Pap test, the cells on the surface of your cervix are checked to see if they look normal, abnormal, or if they show signs of having been altered by a certain type of virus called human papillomavirus, or HPV. Cervical cells that have been affected by HPV are called dysplasia. Dysplasia is not cancer, but describes abnormal cells found on the surface of the cervix. Depending on the degree of dysplasia, some of the cells may be considered pre-cancerous and may turn into cancer over time if follow up with a caregiver is delayed.  WHAT DOES AN ABNORMAL PAP TEST MEAN? Having an abnormal pap test does not mean that you have cancer. However, certain types of abnormal pap tests can be a sign that a person is at a higher risk of developing cancer. Your caregiver will want to do other tests to find out more about the abnormal cells. Your abnormal Pap test results could show:   Small and uncertain changes that should be carefully watched.   Cervical dysplasia that has caused mild changes and can be followed over time.  Cervical dysplasia that is more severe and needs to be followed and treated to ensure the problem goes away.  Cancer.  When severe cervical dysplasia is found and treated early, it rarely will grow into cancer.  WHAT WILL BE DONE ABOUT MY ABNORMAL PAP TEST?  A colposcopy may be needed. This is a procedure where your cervix is examined using light and magnification.  A small tissue sample of your cervix (biopsy) may need to be removed and then examined. This is often performed if there are areas that appear infected.  A sample of cells from the cervical canal may be removed with either a small brush or scraping instrument (curette). Based on the results of the  procedures above, some caregivers may recommend either cryotherapy of the cervix or a surgical LEEP where a portion of the cervix is removed. LEEP is short for "loop electrical excisional procedure." Rarely, a caregiver may recommend a cone biopsy.This is a procedure where a small, cone-shaped sample of your cervix is taken out. The part that is taken out is the area where the abnormal cells are.  WHAT IF I HAVE A DYSPLASIA OR A CANCER? You may be referred to a specialist. Radiation may also be a treatment for more advanced cancer. Having a hysterectomy is the last treatment option for dysplasia, but it is a more common treatment for someone with cancer. All treatment options will be discussed with you by your caregiver. WHAT SHOULD YOU DO AFTER BEING TREATED? If you have had an abnormal pap test, you should continue to have regular pap tests and check-ups as directed by your caregiver. Your cervical problem will be carefully watched so it does not get worse. Also, your caregiver can watch for, and treat, any new problems that may come up. Document Released: 03/21/2011 Document Revised: 03/31/2013 Document Reviewed: 11/30/2011 [facility] Patient Information 2015 [place], [place]. This information is not intended to replace advice given to you by your health care provider. Make sure you discuss any questions you have with your health care provider.   Human Papillomavirus Human papillomavirus (HPV) is the most common sexually transmitted infection (STI) and is highly contagious. HPV infections cause  genital warts and cancers to the outlet of the womb (cervix), birth canal (vagina), opening of the birth canal (vulva), and anus. There are over 100 types of HPV. Four types of HPV are responsible for causing 70% of all cervical cancers. Ninety percent of anal cancers and genital warts are caused by HPV. Unless you have wart-like lesions in the throat or genital warts that you can see or feel, HPV usually does not  cause symptoms. Therefore, people can be infected for long periods and pass it on to others without knowing it. HPV in pregnancy usually does not cause a problem for the mother or baby. If the mother has genital warts, the baby rarely gets infected. When the HPV infection is found to be pre-cancerous on the cervix, vagina, or vulva, the mother will be followed closely during the pregnancy. Any needed treatment will be done after the baby is born. CAUSES   Having unprotected sex. HPV can be spread by oral, vaginal, or anal sexual activity.  Having several sex partners.  Having a sex partner who has other sex partners.  Having or having had another sexually transmitted infection. SYMPTOMS   More than 90% of people carrying HPV cannot tell anything is wrong.  Wart-like lesions in the throat (from having oral sex).  Warts in the infected skin or mucous membranes.  Genital warts may itch, burn, or bleed.  Genital warts may be painful or bleed during sexual intercourse. DIAGNOSIS   Genital warts are easily seen with the naked eye.  Currently, there is no FDA-approved test to detect HPV in males.  In females, a Pap test can show cells which are infected with HPV.  In females, a scope can be used to view the cervix (colposcopy). A colposcopy can be performed if the pelvic exam or Pap test is abnormal.  In females, a sample of tissue may be removed (biopsy) during the colposcopy. TREATMENT   Treatment of genital warts can include:  Podophyllin lotion or gel.  Bichloroacetic acid (BCA) or trichloroacetic acid (TCA).  Podofilox solution or gel.  Imiquimod cream.  Interferon injections.  Use of a probe to apply extreme cold (cryotherapy).  Application of an intense beam of light (laser treatment).  Use of a probe to apply extreme heat (electrocautery).  Surgery.  HPV of the cervix, vagina, or vulva can be treated with:  Cryotherapy.  Laser  treatment.  Electrocautery.  Surgery. Your caregiver will follow you closely after you are treated. This is because the HPV can come back and may need treatment again. HOME CARE INSTRUCTIONS   Follow your caregiver's instructions regarding medications, Pap tests, and follow-up exams.  Do not touch or scratch the warts.  Do not treat genital warts with medication used for treating hand warts.  Tell your sex partner about your infection because he or she may also need treatment.  Do not have sex while you are being treated.  After treatment, use condoms during sex to prevent future infections.  Have only 1 sex partner.  Have a sex partner who does not have other sex partners.  Use over-the-counter creams for itching or irritation as directed by your caregiver.  Use over-the-counter or prescription medicines for pain, discomfort, or fever as directed by your caregiver.  Do not douche or use tampons during treatment of HPV. PREVENTION   Talk to your caregiver about getting the HPV vaccines. These vaccines prevent some HPV infections and cancers. It is recommended that the vaccine be given to males  and females between the age of 22 and 11 years old. It will not work if you already have HPV and it is not recommended for pregnant women. The vaccines are not recommended for pregnant women.  Call your caregiver if you think you are pregnant and have the HPV.  A PAP test is done to screen for cervical cancer.  The first PAP test should be done at age 31.  Between ages 19 and 57, PAP tests are repeated every 2 years.  Beginning at age 73, you are advised to have a PAP test every 3 years as long as your past 3 PAP tests have been normal.  Some women have medical problems that increase the chance of getting cervical cancer. Talk to your caregiver about these problems. It is especially important to talk to your caregiver if a new problem develops soon after your last PAP test. In these  cases, your caregiver may recommend more frequent screening and Pap tests.  The above recommendations are the same for women who have or have not gotten the vaccine for HPV (Human Papillomavirus).  If you had a hysterectomy for a problem that was not a cancer or a condition that could lead to cancer, then you no longer need Pap tests. However, even if you no longer need a PAP test, a regular exam is a good idea to make sure no other problems are starting.   If you are between ages 35 and 14, and you have had normal Pap tests going back 10 years, you no longer need Pap tests. However, even if you no longer need a PAP test, a regular exam is a good idea to make sure no other problems are starting.  If you have had past treatment for cervical cancer or a condition that could lead to cancer, you need Pap tests and screening for cancer for at least 20 years after your treatment.  If Pap tests have been discontinued, risk factors (such as a new sexual partner)need to be re-assessed to determine if screening should be resumed.  Some women may need screenings more often if they are at high risk for cervical cancer. SEEK MEDICAL CARE IF:   The treated skin becomes red, swollen or painful.  You have an oral temperature above 102 F (38.9 C).  You feel generally ill.  You feel lumps or pimple-like projections in and around your genital area.  You develop bleeding of the vagina or the treatment area.  You develop painful sexual intercourse. Document Released: 02/24/2004 Document Revised: 02/26/2012 Document Reviewed: 03/11/2014 [facility] Patient Information 2015 [place], [place]. This information is not intended to replace advice given to you by your health care provider. Make sure you discuss any questions you have with your health care provider.  Colposcopy, Care After Refer to this sheet in the next few weeks. These instructions provide you with information on caring for yourself after your  procedure. Your health care provider may also give you more specific instructions. Your treatment has been planned according to current medical practices, but problems sometimes occur. Call your health care provider if you have any problems or questions after your procedure. WHAT TO EXPECT AFTER THE PROCEDURE  After your procedure, it is typical to have the following:  Cramping. This often goes away in a few minutes.  Soreness. This may last for 2 days.  Lightheadedness. Lie down for a few minutes if this occurs. You may also have some bleeding or dark discharge for a few days.  You may need to wear a sanitary pad during this time. HOME CARE INSTRUCTIONS  Avoid sex, douching, and using tampons for 3 days or as directed by your health care provider.  Only take over-the-counter or prescription medicines as directed by your health care provider. Do not take aspirin because it can cause bleeding.  Continue to take birth control pills if you are on them.  Not all test results are available during your visit. If your test results are not back during the visit, make an appointment with your health care provider to find out the results. Do not assume everything is normal if you have not heard from your health care provider or the medical facility. It is important for you to follow up on all of your test results.  Follow your health care provider's advice regarding activity, follow-up visits, and follow-up Pap tests. SEEK MEDICAL CARE IF:  You develop a rash.  You have problems with your medicine. SEEK IMMEDIATE MEDICAL CARE IF:  You are bleeding heavily or are passing blood clots.  You have a fever.  You have abnormal vaginal discharge.  You are having cramps that do not go away after taking your pain medicine.  You feel lightheaded, dizzy, or faint.  You have stomach pain. Document Released: 09/24/2013 Document Reviewed: 09/24/2013 [facility] Patient Information 2015 [place], [place].  This information is not intended to replace advice given to you by your health care provider. Make sure you discuss any questions you have with your health care provider.

## 2015-07-15 NOTE — Progress Notes (Signed)
Patient given informed consent, signed copy in the chart.  Placed in lithotomy position. Cervix viewed with speculum and colposcope after application of acetic acid.   Colposcopy adequate (entire squamocolumnar junctions seen  in entirety) ?  yes Acetowhite lesions?no Punctation?no Mosaicism?  no Abnormal vasculature?  no Biopsies?no ECC?no Complications? no  COMMENTS: Patient was given post procedure instructions.Recommend papin one year.

## 2015-07-21 ENCOUNTER — Encounter: Payer: Self-pay | Admitting: Internal Medicine

## 2015-07-22 ENCOUNTER — Encounter: Payer: Self-pay | Admitting: Gastroenterology

## 2015-07-29 ENCOUNTER — Other Ambulatory Visit: Payer: Self-pay | Admitting: Physical Medicine & Rehabilitation

## 2015-07-30 ENCOUNTER — Encounter: Payer: Worker's Compensation | Attending: Physical Medicine & Rehabilitation | Admitting: Registered Nurse

## 2015-07-30 ENCOUNTER — Encounter: Payer: Self-pay | Admitting: Registered Nurse

## 2015-07-30 ENCOUNTER — Other Ambulatory Visit: Payer: Self-pay | Admitting: Physical Medicine & Rehabilitation

## 2015-07-30 VITALS — BP 147/101 | HR 75

## 2015-07-30 DIAGNOSIS — Z981 Arthrodesis status: Secondary | ICD-10-CM | POA: Diagnosis not present

## 2015-07-30 DIAGNOSIS — M79602 Pain in left arm: Secondary | ICD-10-CM | POA: Insufficient documentation

## 2015-07-30 DIAGNOSIS — Z760 Encounter for issue of repeat prescription: Secondary | ICD-10-CM | POA: Diagnosis not present

## 2015-07-30 DIAGNOSIS — M4722 Other spondylosis with radiculopathy, cervical region: Secondary | ICD-10-CM | POA: Diagnosis not present

## 2015-07-30 DIAGNOSIS — M7918 Myalgia, other site: Secondary | ICD-10-CM

## 2015-07-30 DIAGNOSIS — G894 Chronic pain syndrome: Secondary | ICD-10-CM

## 2015-07-30 DIAGNOSIS — F329 Major depressive disorder, single episode, unspecified: Secondary | ICD-10-CM

## 2015-07-30 DIAGNOSIS — M7062 Trochanteric bursitis, left hip: Secondary | ICD-10-CM | POA: Insufficient documentation

## 2015-07-30 DIAGNOSIS — Z79899 Other long term (current) drug therapy: Secondary | ICD-10-CM | POA: Diagnosis not present

## 2015-07-30 DIAGNOSIS — M791 Myalgia: Secondary | ICD-10-CM | POA: Diagnosis not present

## 2015-07-30 DIAGNOSIS — M5412 Radiculopathy, cervical region: Secondary | ICD-10-CM | POA: Insufficient documentation

## 2015-07-30 DIAGNOSIS — G8929 Other chronic pain: Secondary | ICD-10-CM | POA: Diagnosis present

## 2015-07-30 DIAGNOSIS — Z5181 Encounter for therapeutic drug level monitoring: Secondary | ICD-10-CM | POA: Diagnosis not present

## 2015-07-30 DIAGNOSIS — M961 Postlaminectomy syndrome, not elsewhere classified: Secondary | ICD-10-CM | POA: Diagnosis not present

## 2015-07-30 DIAGNOSIS — Z79891 Long term (current) use of opiate analgesic: Secondary | ICD-10-CM | POA: Insufficient documentation

## 2015-07-30 DIAGNOSIS — G8928 Other chronic postprocedural pain: Secondary | ICD-10-CM | POA: Insufficient documentation

## 2015-07-30 DIAGNOSIS — F32A Depression, unspecified: Secondary | ICD-10-CM

## 2015-07-30 MED ORDER — HYDROCODONE-ACETAMINOPHEN 7.5-325 MG PO TABS: 1.0000 | ORAL_TABLET | Freq: Three times a day (TID) | ORAL | Status: DC | PRN

## 2015-07-30 MED ORDER — PREGABALIN 75 MG PO CAPS: 75.0000 mg | ORAL_CAPSULE | Freq: Three times a day (TID) | ORAL | Status: DC
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
## 2015-07-31 LAB — PMP ALCOHOL METABOLITE (ETG): Ethyl Glucuronide (EtG): NEGATIVE ng/mL

## 2015-08-04 DIAGNOSIS — N3946 Mixed incontinence: Secondary | ICD-10-CM | POA: Diagnosis not present

## 2015-08-04 DIAGNOSIS — A5901 Trichomonal vulvovaginitis: Secondary | ICD-10-CM | POA: Diagnosis not present

## 2015-08-04 DIAGNOSIS — N393 Stress incontinence (female) (male): Secondary | ICD-10-CM | POA: Diagnosis not present

## 2015-08-05 LAB — PRESCRIPTION MONITORING PROFILE (SOLSTAS)
AMPHETAMINE/METH: NEGATIVE ng/mL
BARBITURATE SCREEN, URINE: NEGATIVE ng/mL
BUPRENORPHINE, URINE: NEGATIVE ng/mL
Benzodiazepine Screen, Urine: NEGATIVE ng/mL
CREATININE, URINE: 143.47 mg/dL (ref >20.0)
Cannabinoid Scrn, Ur: NEGATIVE ng/mL
Carisoprodol, Urine: NEGATIVE ng/mL
Cocaine Metabolites: NEGATIVE ng/mL
Fentanyl, Ur: NEGATIVE ng/mL
MDMA URINE: NEGATIVE ng/mL
Meperidine, Ur: NEGATIVE ng/mL
Methadone Screen, Urine: NEGATIVE ng/mL
Nitrites, Initial: NEGATIVE ug/mL
Oxycodone Screen, Ur: NEGATIVE ng/mL
PROPOXYPHENE: NEGATIVE ng/mL
TRAMADOL UR: NEGATIVE ng/mL
Tapentadol, urine: NEGATIVE ng/mL
ZOLPIDEM, URINE: NEGATIVE ng/mL
pH, Initial: 5.5 pH (ref 4.5–8.9)

## 2015-08-05 LAB — OPIATES/OPIOIDS (LC/MS-MS)
Codeine Urine: NEGATIVE ng/mL (ref <50)
HYDROCODONE: 696 ng/mL (ref <50)
HYDROMORPHONE: 201 ng/mL (ref <50)
MORPHINE: NEGATIVE ng/mL (ref <50)
NORHYDROCODONE, UR: 837 ng/mL (ref <50)
NOROXYCODONE, UR: NEGATIVE ng/mL (ref <50)
OXYCODONE, UR: NEGATIVE ng/mL (ref <50)
Oxymorphone: NEGATIVE ng/mL (ref <50)

## 2015-08-11 ENCOUNTER — Ambulatory Visit: Payer: Medicare Other | Attending: Physician Assistant

## 2015-08-12 ENCOUNTER — Other Ambulatory Visit: Payer: Self-pay | Admitting: Registered Nurse

## 2015-08-12 NOTE — Progress Notes (Signed)
Urine drug screen for this encounter is consistent for prescribed medication 

## 2015-08-16 ENCOUNTER — Other Ambulatory Visit: Payer: Self-pay | Admitting: Family Medicine

## 2015-08-16 DIAGNOSIS — N3281 Overactive bladder: Secondary | ICD-10-CM

## 2015-08-16 NOTE — Telephone Encounter (Signed)
Pt calling and states that scripts were accidentally thrown away in a bag. Would like for scripts for amlodipine 5mg  and oxybutynin (DITROPAN-XL) 10 MG to be sent to her pharmacy, [facility] on [address]. Thank you, Fonda Kinder, ASA

## 2015-08-16 NOTE — Telephone Encounter (Signed)
Pt called because she needs a refill on her Oxybutynin, Amlodipine, and Lisinopril called in to the [facility]. jw

## 2015-08-17 MED ORDER — AMLODIPINE BESYLATE 5 MG PO TABS: 5.0000 mg | ORAL_TABLET | Freq: Every day | ORAL | Status: DC

## 2015-08-17 MED ORDER — OXYBUTYNIN CHLORIDE ER 10 MG PO TB24
20.0000 mg | ORAL_TABLET | Freq: Every day | ORAL | Status: DC
Start: 2015-08-17 — End: 2017-08-01

## 2015-08-17 MED ORDER — QUINAPRIL-HYDROCHLOROTHIAZIDE 20-12.5 MG PO TABS: 1.0000 | ORAL_TABLET | Freq: Every day | ORAL | Status: DC

## 2015-08-17 NOTE — Telephone Encounter (Signed)
Refill completed.

## 2015-08-25 ENCOUNTER — Encounter: Payer: Worker's Compensation | Admitting: Registered Nurse

## 2015-08-25 ENCOUNTER — Encounter: Payer: Worker's Compensation | Attending: Physical Medicine & Rehabilitation | Admitting: Registered Nurse

## 2015-08-25 ENCOUNTER — Encounter: Payer: Self-pay | Admitting: Registered Nurse

## 2015-08-25 VITALS — BP 116/78 | HR 62 | Resp 14

## 2015-08-25 DIAGNOSIS — G894 Chronic pain syndrome: Secondary | ICD-10-CM | POA: Diagnosis not present

## 2015-08-25 DIAGNOSIS — N3946 Mixed incontinence: Secondary | ICD-10-CM | POA: Insufficient documentation

## 2015-08-25 DIAGNOSIS — M5412 Radiculopathy, cervical region: Secondary | ICD-10-CM | POA: Diagnosis not present

## 2015-08-25 DIAGNOSIS — Z79899 Other long term (current) drug therapy: Secondary | ICD-10-CM

## 2015-08-25 DIAGNOSIS — M6248 Contracture of muscle, other site: Secondary | ICD-10-CM

## 2015-08-25 DIAGNOSIS — M79602 Pain in left arm: Secondary | ICD-10-CM | POA: Diagnosis not present

## 2015-08-25 DIAGNOSIS — M4722 Other spondylosis with radiculopathy, cervical region: Secondary | ICD-10-CM | POA: Diagnosis not present

## 2015-08-25 DIAGNOSIS — Z5181 Encounter for therapeutic drug level monitoring: Secondary | ICD-10-CM

## 2015-08-25 DIAGNOSIS — Z981 Arthrodesis status: Secondary | ICD-10-CM | POA: Insufficient documentation

## 2015-08-25 DIAGNOSIS — Z760 Encounter for issue of repeat prescription: Secondary | ICD-10-CM | POA: Diagnosis not present

## 2015-08-25 DIAGNOSIS — F329 Major depressive disorder, single episode, unspecified: Secondary | ICD-10-CM

## 2015-08-25 DIAGNOSIS — M791 Myalgia: Secondary | ICD-10-CM

## 2015-08-25 DIAGNOSIS — M961 Postlaminectomy syndrome, not elsewhere classified: Secondary | ICD-10-CM | POA: Diagnosis not present

## 2015-08-25 DIAGNOSIS — M7062 Trochanteric bursitis, left hip: Secondary | ICD-10-CM | POA: Insufficient documentation

## 2015-08-25 DIAGNOSIS — G8928 Other chronic postprocedural pain: Secondary | ICD-10-CM | POA: Diagnosis not present

## 2015-08-25 DIAGNOSIS — M7918 Myalgia, other site: Secondary | ICD-10-CM

## 2015-08-25 DIAGNOSIS — G8929 Other chronic pain: Secondary | ICD-10-CM | POA: Diagnosis present

## 2015-08-25 DIAGNOSIS — F32A Depression, unspecified: Secondary | ICD-10-CM

## 2015-08-25 DIAGNOSIS — Z79891 Long term (current) use of opiate analgesic: Secondary | ICD-10-CM | POA: Diagnosis not present

## 2015-08-25 DIAGNOSIS — M62838 Other muscle spasm: Secondary | ICD-10-CM

## 2015-08-25 MED ORDER — HYDROCODONE-ACETAMINOPHEN 7.5-325 MG PO TABS: 1.0000 | ORAL_TABLET | Freq: Three times a day (TID) | ORAL | Status: DC | PRN

## 2015-08-25 NOTE — Patient Instructions (Signed)
Continue with Exercise Range of Motion to Neck.  Hydrocodone: Take One Tablet every 8 hours as needed for Moderate Pain.  When Pain is sever you may take a half tablet to one tablet. No more than 4 a Day. You only can take a whole tablet 10 days out of the month.   Use Sparingly.  Continue to use Ice and Heat Therapy  You May Increase Tizanidine to Four times a Day for Muscle Spasms  Call the Office in week to evaluate Medication regime.
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
## 2015-08-27 ENCOUNTER — Encounter: Payer: Self-pay | Admitting: *Deleted

## 2015-08-27 NOTE — Telephone Encounter (Signed)
Erroneous encounter

## 2015-09-01 ENCOUNTER — Ambulatory Visit (AMBULATORY_SURGERY_CENTER): Payer: Self-pay | Admitting: *Deleted

## 2015-09-01 ENCOUNTER — Encounter: Payer: Self-pay | Admitting: Internal Medicine

## 2015-09-01 VITALS — Ht 62.0 in | Wt 168.0 lb

## 2015-09-01 DIAGNOSIS — R1310 Dysphagia, unspecified: Secondary | ICD-10-CM

## 2015-09-01 DIAGNOSIS — Z1211 Encounter for screening for malignant neoplasm of colon: Secondary | ICD-10-CM

## 2015-09-01 MED ORDER — NA SULFATE-K SULFATE-MG SULF 17.5-3.13-1.6 GM/177ML PO SOLN: ORAL | Status: DC

## 2015-09-01 NOTE — Progress Notes (Signed)
Patient denies any allergies to eggs or soy. Patient denies any problems with anesthesia/sedation. Patient denies any oxygen use at home and does not take any diet/weight loss medications. No email per pt. 

## 2015-09-06 ENCOUNTER — Other Ambulatory Visit: Payer: Self-pay | Admitting: *Deleted

## 2015-09-06 MED ORDER — ALBUTEROL SULFATE HFA 108 (90 BASE) MCG/ACT IN AERS: 2.0000 | INHALATION_SPRAY | Freq: Four times a day (QID) | RESPIRATORY_TRACT | Status: DC | PRN

## 2015-09-07 ENCOUNTER — Other Ambulatory Visit: Payer: Self-pay | Admitting: Family Medicine

## 2015-09-07 ENCOUNTER — Encounter: Payer: Self-pay | Admitting: Gastroenterology

## 2015-09-07 ENCOUNTER — Ambulatory Visit (AMBULATORY_SURGERY_CENTER): Payer: Medicare Other | Admitting: Gastroenterology

## 2015-09-07 VITALS — BP 139/92 | HR 64 | Temp 98.1°F | Resp 171 | Ht 62.0 in | Wt 168.0 lb

## 2015-09-07 DIAGNOSIS — Z1211 Encounter for screening for malignant neoplasm of colon: Secondary | ICD-10-CM

## 2015-09-07 DIAGNOSIS — I10 Essential (primary) hypertension: Secondary | ICD-10-CM | POA: Diagnosis not present

## 2015-09-07 DIAGNOSIS — R1310 Dysphagia, unspecified: Secondary | ICD-10-CM | POA: Diagnosis not present

## 2015-09-07 DIAGNOSIS — K219 Gastro-esophageal reflux disease without esophagitis: Secondary | ICD-10-CM | POA: Diagnosis not present

## 2015-09-07 MED ORDER — SODIUM CHLORIDE 0.9 % IV SOLN: 500.0000 mL | INTRAVENOUS | Status: DC

## 2015-09-07 MED ORDER — OLMESARTAN MEDOXOMIL-HCTZ 20-12.5 MG PO TABS: 1.0000 | ORAL_TABLET | Freq: Every day | ORAL | Status: DC

## 2015-09-07 NOTE — Op Note (Signed)
[place]  [address] [place], [id number]   ENDOSCOPY PROCEDURE REPORT  PATIENT: [name], [name]  MR#: [id number] BIRTHDATE: [birth year] , 40  yrs. old GENDER: female ENDOSCOPIST: [place] Cellar, MD REFERRED BY: PROCEDURE DATE:  09/07/2015 PROCEDURE:  EGD w/ biopsy ASA CLASS:     Class II INDICATIONS:  dysphagia. MEDICATIONS: Propofol 200 mg IV TOPICAL ANESTHETIC:  DESCRIPTION OF PROCEDURE: After the risks benefits and alternatives of the procedure were thoroughly explained, informed consent was obtained.  The LB [id number] endoscope was introduced through the mouth and advanced to the second portion of the duodenum , Without limitations.  The instrument was slowly withdrawn as the mucosa was fully examined.    FINDINGS: The esophagus had grossly normal mucosa without stricture or stenosis.  Random biopsies were taken to ensure no evidence of eosinophilic esophagitis.  The DH, GEJ, and SCJ were located 38cm from the incisors.  The stomach had no mucosal abnormalities.  The duodenal bulb and 2nd portion of the duodenum were normal. Retroflexed views revealed no abnormalities.     The scope was then withdrawn from the patient and the procedure completed.  COMPLICATIONS: There were no immediate complications.  ENDOSCOPIC IMPRESSION: Normal esophagus, no evidence of pathology noted to cause dysphagia. Biopsies taken to rule out EoE Normal examined stomach Normal duodenum    RECOMMENDATIONS: Resume diet Resume medications Await pathology results. If normal pathology, consider esophageal manometry to assess for esophageal dysmotility, which I suspect may be causing the patient's symptoms.    eSigned:  [place] Cellar, MD 09/07/2015 3:06 PM    CC:  PATIENT NAME:  Maria Griffith, Maria Griffith MR#: [id number]

## 2015-09-07 NOTE — Telephone Encounter (Signed)
[facility] does not carry Accuretic any longer. Will switch her to [employer]

## 2015-09-07 NOTE — Progress Notes (Signed)
Transferred to recovery room. A/O x3, pleased with MAC.  VSS.  Report to [employer].

## 2015-09-07 NOTE — Op Note (Signed)
[place]  [address] [place], [id number]   COLONOSCOPY PROCEDURE REPORT  Griffith: Maria Griffith, Maria Griffith  MR#: [id number] BIRTHDATE: October 12, 1961 , 81  yrs. old GENDER: female ENDOSCOPIST: [place] Cellar, MD REFERRED BY: Andrena Mews, MD PROCEDURE DATE:  09/07/2015 PROCEDURE:   Colonoscopy, screening First Screening Colonoscopy - Avg.  risk and is 50 yrs.  old or older Yes.  Prior Negative Screening - Now for repeat screening. N/A  History of Adenoma - Now for follow-up colonoscopy & has been > or = to 3 yrs.  N/A  Polyps removed today? No Recommend repeat exam, <10 yrs? No ASA CLASS:   Class II INDICATIONS:Screening for colonic neoplasia and Colorectal Neoplasm Risk Assessment for this procedure is average risk. MEDICATIONS: Propofol 250 mg IV  DESCRIPTION OF PROCEDURE:   After Maria risks benefits and alternatives of Maria procedure were thoroughly explained, informed consent was obtained.  Maria digital rectal exam revealed no abnormalities of Maria rectum.   Maria LB [id number]  endoscope was introduced through Maria anus and advanced to Maria cecum, which was identified by both Maria appendix and ileocecal valve. No adverse events experienced.   Maria quality of Maria prep was adequate  Maria instrument was then slowly withdrawn as Maria colon was fully examined. Estimated blood loss is zero unless otherwise noted in this procedure report.      COLON FINDINGS: A normal appearing cecum, ileocecal valve, and appendiceal orifice were identified.  Maria ascending, transverse, descending, sigmoid colon, and rectum appeared unremarkable.  No polyps or mass lesions noted. Retroflexed views revealed no abnormalities. Maria time to cecum = 6.0 Withdrawal time = 13.1   Maria scope was withdrawn and Maria procedure completed. COMPLICATIONS: There were no immediate complications.  ENDOSCOPIC IMPRESSION: Normal colonoscopy  RECOMMENDATIONS: Resume diet Resume medications Repeat  colonoscopy in 10 years for screening purposes if no first degree family history of colon cancer  eSigned:  [place] Cellar, MD 09/07/2015 3:11 PM   cc: Andrena Mews MD, Maria Griffith

## 2015-09-07 NOTE — Patient Instructions (Signed)

## 2015-09-07 NOTE — Progress Notes (Signed)
Called to room to assist during endoscopic procedure.  Patient ID and intended procedure confirmed with present staff. Received instructions for my participation in the procedure from the performing physician.  

## 2015-09-08 ENCOUNTER — Telehealth: Payer: Self-pay | Admitting: *Deleted

## 2015-09-08 NOTE — Telephone Encounter (Signed)
No answer. No identifier. Message left to call if questions or concerns. 

## 2015-09-10 ENCOUNTER — Telehealth: Payer: Self-pay | Admitting: Family Medicine

## 2015-09-10 NOTE — Telephone Encounter (Signed)
Expand All Collapse All  [facility] pharmacy does not carry Accuretic any longer. Will switch her to BenicarHCT   I called patient to inform her of the change today. I advised she does not need to take the two medication together, she has been switched from Accuretic to [employer]. She verbalized understanding.

## 2015-09-14 ENCOUNTER — Other Ambulatory Visit: Payer: Self-pay | Admitting: *Deleted

## 2015-09-14 ENCOUNTER — Encounter: Payer: Self-pay | Admitting: *Deleted

## 2015-09-14 DIAGNOSIS — R1314 Dysphagia, pharyngoesophageal phase: Secondary | ICD-10-CM

## 2015-09-21 ENCOUNTER — Telehealth: Payer: Self-pay | Admitting: Family Medicine

## 2015-09-21 ENCOUNTER — Telehealth: Payer: Self-pay | Admitting: *Deleted

## 2015-09-21 MED ORDER — OLMESARTAN MEDOXOMIL-HCTZ 20-12.5 MG PO TABS: 1.0000 | ORAL_TABLET | Freq: Every day | ORAL | Status: DC

## 2015-09-21 NOTE — Telephone Encounter (Signed)
Received a fax from [facility] stating Accuretic is not available for free from [facility] any longer.  Please consider changing to Benicar HCT.  If changing please fax new Rx to [facility].  Derl Barrow, RN

## 2015-09-21 NOTE — Telephone Encounter (Signed)
This was addressed two weeks ago. I already faxed Benicar.

## 2015-09-21 NOTE — Telephone Encounter (Signed)
I placed the order again and changed the pharmacy. Thanks.

## 2015-09-21 NOTE — Telephone Encounter (Signed)
The medication was faxed to [facility] and not [facility].  Derl Barrow, RN

## 2015-09-23 ENCOUNTER — Ambulatory Visit (HOSPITAL_BASED_OUTPATIENT_CLINIC_OR_DEPARTMENT_OTHER): Payer: Worker's Compensation | Admitting: Physical Medicine & Rehabilitation

## 2015-09-23 ENCOUNTER — Encounter: Payer: Self-pay | Admitting: Physical Medicine & Rehabilitation

## 2015-09-23 ENCOUNTER — Encounter: Payer: Worker's Compensation | Attending: Physical Medicine & Rehabilitation

## 2015-09-23 VITALS — HR 65 | Resp 14

## 2015-09-23 DIAGNOSIS — Z79899 Other long term (current) drug therapy: Secondary | ICD-10-CM | POA: Insufficient documentation

## 2015-09-23 DIAGNOSIS — G894 Chronic pain syndrome: Secondary | ICD-10-CM | POA: Diagnosis not present

## 2015-09-23 DIAGNOSIS — Z5181 Encounter for therapeutic drug level monitoring: Secondary | ICD-10-CM | POA: Insufficient documentation

## 2015-09-23 DIAGNOSIS — M436 Torticollis: Secondary | ICD-10-CM | POA: Diagnosis not present

## 2015-09-23 DIAGNOSIS — M791 Myalgia: Secondary | ICD-10-CM | POA: Diagnosis not present

## 2015-09-23 MED ORDER — HYDROCODONE-ACETAMINOPHEN 7.5-325 MG PO TABS: 1.0000 | ORAL_TABLET | Freq: Three times a day (TID) | ORAL | Status: DC | PRN

## 2015-09-23 NOTE — Progress Notes (Signed)
Procedure: Dysport injection Diagnosis: Cervical dystonia G 24.3 Dilution: 500 units in 1 mL sterile preservative-free normal saline  Informed consent was obtained after describing risks and benefits of the procedure with patient including bleeding bruising infection as well as the potential side effects of the medication itself. Patient elects to proceed and has given written consent.  Patient placed in a seated position Areas were marked and prepped with Betadine  Needle: 27-gauge 1 inch needle electrode connected to EMG amplifier  Right trapezius: 125 units Left trapezius: 125 units Left longissimus: 125 units Left splenius capitis: 62.5 units Left levator scapula: 62.5 units  Patient tolerated procedure well postprocedure instructions given Met with the patient's case manager discussed case. We will keep Lyrica dose the same and see how the procedure helps alleviate pain. Nurse practitioner visit 1 month [employer] membership recommended, walking 2-3 minutes on treadmill or track increase by 1-2 minutes per week with goal of 45 minutes Aquatic aerobic class may be beneficial to help with overall conditioning M.D. Visit in 3 months for possible reinjection at that time

## 2015-09-23 NOTE — Patient Instructions (Signed)
AbobotulinumtoxinA injection=DYSPORT, Similar but a longer acting than BOTOX What is this medicine? ABOBOTULINUMTOXINA (ay boh BOT yoo li num TOX in A) is a neuro-muscular blocker. This medicine is used to treat severe neck muscle spasms. It is also used to treat muscle spasms in the elbow, wrist, and finger muscles in adults and in the calf muscles in children. It is also used to treat frown lines or lines between the eyebrows on the face. This medicine may be used for other purposes; ask your health care provider or pharmacist if you have questions. What should I tell my health care provider before I take this medicine? They need to know if you have any of these conditions: -breathing problems -diabetes -heart problems -history of surgery where this medicine is going to be used -infection where this medicine is going to be used -myasthenia gravis or other neurologic disease -nerve or muscle disease -surgery plans -an unusual or allergic reaction to botulinum toxin, albumin, cow's milk protein, other medicines, foods, dyes, or preservatives -pregnant or trying to get pregnant -breast-feeding How should I use this medicine? This medicine is for injection into a muscle. It is given by a health care professional in a hospital or clinic setting. A special MedGuide will be given to you with each prescription and refill. Be sure to read this information carefully each time. Talk to your pediatrician regarding the use of this medicine in children. While this drug may be prescribed for children as young as 2 years for selected conditions, precautions to apply. Overdosage: If you think you have taken too much of this medicine contact a poison control center or emergency room at once. NOTE: This medicine is only for you. Do not share this medicine with others. What if I miss a dose? This does not apply. What may interact with this medicine? -aminoglycoside antibiotics like gentamicin, neomycin,  tobramycin -muscle relaxants -other botulinum toxin injections This list may not describe all possible interactions. Give your health care provider a list of all the medicines, herbs, non-prescription drugs, or dietary supplements you use. Also tell them if you smoke, drink alcohol, or use illegal drugs. Some items may interact with your medicine. What should I watch for while using this medicine? Visit your doctor for regular check ups. This medicine will cause weakness in the muscle where it is injected. Tell your doctor if you feel unusually weak in other muscles. Get medical help right away if you have problems with breathing, swallowing, or talking. This medicine contains albumin from human blood. It may be possible to pass an infection in this medicine, but no cases have been reported. Talk to your doctor about the risks and benefits of this medicine. If your activities have been limited by your condition, go back to your regular routine slowly after treatment with this medicine. This medicine can make your muscles weak. And, this medicine can make your eyelids droop or make you see blurry or double. If you have weak muscles or trouble seeing do not drive a car, use machinery, or do other dangerous activities. What side effects may I notice from receiving this medicine? Side effects that you should report to your doctor or health care professional as soon as possible: -allergic reactions like skin rash, itching or hives, swelling of the face, lips, or tongue -breathing problems -changes in vision -chest pain or tightness -eye swelling or drooping of the eyelids -fast, irregular heartbeat -loss of bladder control -numbness or weakness -signs and symptoms of infection like  fever or chills; cough; flu-like symptoms; sore throat -numbness or weakness -speech problems -swallowing problems Side effects that usually do not require medical attention (report to your doctor or health care  professional if they continue or are bothersome): -bruising or pain at site where injected -dizziness -dry eyes or eye irritation -dry mouth -headache -runny nose This list may not describe all possible side effects. Call your doctor for medical advice about side effects. You may report side effects to FDA at 1-800-FDA-1088. Where should I keep my medicine? This drug is given in a hospital or clinic and will not be stored at home. NOTE: This sheet is a summary. It may not cover all possible information. If you have questions about this medicine, talk to your doctor, pharmacist, or health care provider.    2016, Elsevier/Gold Standard. (2015-07-21 14:54:52)

## 2015-10-04 ENCOUNTER — Ambulatory Visit (HOSPITAL_COMMUNITY): Admission: RE | Admit: 2015-10-04 | Payer: Medicare Other | Source: Ambulatory Visit | Admitting: Gastroenterology

## 2015-10-04 ENCOUNTER — Encounter (HOSPITAL_COMMUNITY): Admission: RE | Payer: Self-pay | Source: Ambulatory Visit

## 2015-10-04 SURGERY — MANOMETRY, ESOPHAGUS

## 2015-10-06 ENCOUNTER — Other Ambulatory Visit: Payer: Self-pay | Admitting: Physical Medicine & Rehabilitation

## 2015-10-12 ENCOUNTER — Telehealth: Payer: Self-pay | Admitting: Physical Medicine & Rehabilitation

## 2015-10-12 NOTE — Telephone Encounter (Signed)
Patient received her power wheelchair, but is unable to transport it.  In order for her to get the hitch that she needs to transport it a prescription for it will need to be written.  Patient has spoken with her workers comp and this is what they are telling her to do.  If you have any questions please call her at [phone].

## 2015-10-13 NOTE — Telephone Encounter (Signed)
I didn't order a power chair

## 2015-10-13 NOTE — Telephone Encounter (Signed)
Can you look into this?

## 2015-10-14 ENCOUNTER — Telehealth: Payer: Self-pay | Admitting: *Deleted

## 2015-10-14 NOTE — Telephone Encounter (Signed)
Patient no showed manometry.States she forgot about the test. She thinks she had a test like this at [facility]. She will have the report faxed to [place]. If it is not the correct test she will reschedule the manometry.

## 2015-10-14 NOTE — Telephone Encounter (Signed)
Ordered: Marketing executive for [employer].

## 2015-10-14 NOTE — Telephone Encounter (Signed)
Ok. I will await the ENT report but doubt it is esophageal manometry as they don't perform that procedure. I imagine if her symptoms persist and she has not had manometry, we will reschedule her. Thanks for letting me know

## 2015-10-19 ENCOUNTER — Telehealth: Payer: Self-pay | Admitting: Physical Medicine & Rehabilitation

## 2015-10-21 ENCOUNTER — Encounter: Payer: Worker's Compensation | Attending: Registered Nurse | Admitting: Registered Nurse

## 2015-10-21 ENCOUNTER — Encounter: Payer: Self-pay | Admitting: Registered Nurse

## 2015-10-21 VITALS — BP 145/89 | HR 63

## 2015-10-21 DIAGNOSIS — I10 Essential (primary) hypertension: Secondary | ICD-10-CM | POA: Insufficient documentation

## 2015-10-21 DIAGNOSIS — G894 Chronic pain syndrome: Secondary | ICD-10-CM | POA: Diagnosis not present

## 2015-10-21 DIAGNOSIS — Z5181 Encounter for therapeutic drug level monitoring: Secondary | ICD-10-CM | POA: Diagnosis not present

## 2015-10-21 DIAGNOSIS — F329 Major depressive disorder, single episode, unspecified: Secondary | ICD-10-CM | POA: Insufficient documentation

## 2015-10-21 DIAGNOSIS — M791 Myalgia: Secondary | ICD-10-CM | POA: Insufficient documentation

## 2015-10-21 DIAGNOSIS — G43109 Migraine with aura, not intractable, without status migrainosus: Secondary | ICD-10-CM | POA: Diagnosis not present

## 2015-10-21 DIAGNOSIS — M6248 Contracture of muscle, other site: Secondary | ICD-10-CM

## 2015-10-21 DIAGNOSIS — M542 Cervicalgia: Secondary | ICD-10-CM | POA: Insufficient documentation

## 2015-10-21 DIAGNOSIS — F32A Depression, unspecified: Secondary | ICD-10-CM

## 2015-10-21 DIAGNOSIS — G8928 Other chronic postprocedural pain: Secondary | ICD-10-CM | POA: Diagnosis not present

## 2015-10-21 DIAGNOSIS — M4722 Other spondylosis with radiculopathy, cervical region: Secondary | ICD-10-CM | POA: Diagnosis present

## 2015-10-21 DIAGNOSIS — F419 Anxiety disorder, unspecified: Secondary | ICD-10-CM | POA: Insufficient documentation

## 2015-10-21 DIAGNOSIS — J45909 Unspecified asthma, uncomplicated: Secondary | ICD-10-CM | POA: Insufficient documentation

## 2015-10-21 DIAGNOSIS — R0602 Shortness of breath: Secondary | ICD-10-CM | POA: Diagnosis not present

## 2015-10-21 DIAGNOSIS — M79605 Pain in left leg: Secondary | ICD-10-CM | POA: Insufficient documentation

## 2015-10-21 DIAGNOSIS — Z79899 Other long term (current) drug therapy: Secondary | ICD-10-CM

## 2015-10-21 DIAGNOSIS — M7918 Myalgia, other site: Secondary | ICD-10-CM

## 2015-10-21 DIAGNOSIS — M62838 Other muscle spasm: Secondary | ICD-10-CM

## 2015-10-21 DIAGNOSIS — M961 Postlaminectomy syndrome, not elsewhere classified: Secondary | ICD-10-CM

## 2015-10-21 DIAGNOSIS — M5412 Radiculopathy, cervical region: Secondary | ICD-10-CM | POA: Diagnosis not present

## 2015-10-21 MED ORDER — TIZANIDINE HCL 4 MG PO TABS
4.0000 mg | ORAL_TABLET | Freq: Three times a day (TID) | ORAL | Status: DC
Start: 2015-10-21 — End: 2016-02-16

## 2015-10-21 MED ORDER — PREGABALIN 75 MG PO CAPS
75.0000 mg | ORAL_CAPSULE | Freq: Three times a day (TID) | ORAL | Status: DC
Start: 2015-10-21 — End: 2015-12-18

## 2015-10-21 MED ORDER — HYDROCODONE-ACETAMINOPHEN 7.5-325 MG PO TABS: 1.0000 | ORAL_TABLET | Freq: Three times a day (TID) | ORAL | Status: DC | PRN

## 2015-10-21 MED ORDER — TRAZODONE HCL 150 MG PO TABS: 150.0000 mg | ORAL_TABLET | Freq: Every day | ORAL | Status: DC
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
## 2015-10-28 NOTE — Telephone Encounter (Signed)
Corene Cornea took care of this, script personally handed to workers comp Tourist information centre manager

## 2015-11-06 ENCOUNTER — Other Ambulatory Visit: Payer: Self-pay | Admitting: Family Medicine

## 2015-11-19 ENCOUNTER — Encounter: Payer: Worker's Compensation | Attending: Physical Medicine & Rehabilitation | Admitting: Registered Nurse

## 2015-11-19 ENCOUNTER — Encounter: Payer: Self-pay | Admitting: Registered Nurse

## 2015-11-19 VITALS — BP 128/86 | HR 68

## 2015-11-19 DIAGNOSIS — M4722 Other spondylosis with radiculopathy, cervical region: Secondary | ICD-10-CM

## 2015-11-19 DIAGNOSIS — Z5181 Encounter for therapeutic drug level monitoring: Secondary | ICD-10-CM

## 2015-11-19 DIAGNOSIS — M5412 Radiculopathy, cervical region: Secondary | ICD-10-CM | POA: Insufficient documentation

## 2015-11-19 DIAGNOSIS — G8928 Other chronic postprocedural pain: Secondary | ICD-10-CM | POA: Insufficient documentation

## 2015-11-19 DIAGNOSIS — M961 Postlaminectomy syndrome, not elsewhere classified: Secondary | ICD-10-CM | POA: Insufficient documentation

## 2015-11-19 DIAGNOSIS — G894 Chronic pain syndrome: Secondary | ICD-10-CM

## 2015-11-19 DIAGNOSIS — M791 Myalgia: Secondary | ICD-10-CM

## 2015-11-19 DIAGNOSIS — Z79891 Long term (current) use of opiate analgesic: Secondary | ICD-10-CM | POA: Insufficient documentation

## 2015-11-19 DIAGNOSIS — M79602 Pain in left arm: Secondary | ICD-10-CM | POA: Insufficient documentation

## 2015-11-19 DIAGNOSIS — Z981 Arthrodesis status: Secondary | ICD-10-CM | POA: Insufficient documentation

## 2015-11-19 DIAGNOSIS — G8929 Other chronic pain: Secondary | ICD-10-CM | POA: Diagnosis present

## 2015-11-19 DIAGNOSIS — F329 Major depressive disorder, single episode, unspecified: Secondary | ICD-10-CM

## 2015-11-19 DIAGNOSIS — F32A Depression, unspecified: Secondary | ICD-10-CM

## 2015-11-19 DIAGNOSIS — Z79899 Other long term (current) drug therapy: Secondary | ICD-10-CM

## 2015-11-19 DIAGNOSIS — Z760 Encounter for issue of repeat prescription: Secondary | ICD-10-CM | POA: Diagnosis not present

## 2015-11-19 DIAGNOSIS — M7918 Myalgia, other site: Secondary | ICD-10-CM

## 2015-11-19 DIAGNOSIS — M7062 Trochanteric bursitis, left hip: Secondary | ICD-10-CM | POA: Insufficient documentation

## 2015-11-19 MED ORDER — HYDROCODONE-ACETAMINOPHEN 7.5-325 MG PO TABS: 1.0000 | ORAL_TABLET | Freq: Three times a day (TID) | ORAL | Status: DC | PRN
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
## 2015-12-01 DIAGNOSIS — N3946 Mixed incontinence: Secondary | ICD-10-CM | POA: Diagnosis not present

## 2015-12-18 ENCOUNTER — Other Ambulatory Visit: Payer: Self-pay | Admitting: Registered Nurse

## 2015-12-24 ENCOUNTER — Ambulatory Visit (HOSPITAL_BASED_OUTPATIENT_CLINIC_OR_DEPARTMENT_OTHER): Payer: Worker's Compensation | Admitting: Physical Medicine & Rehabilitation

## 2015-12-24 ENCOUNTER — Encounter: Payer: Self-pay | Admitting: Physical Medicine & Rehabilitation

## 2015-12-24 ENCOUNTER — Encounter: Payer: Worker's Compensation | Attending: Physical Medicine & Rehabilitation

## 2015-12-24 VITALS — BP 141/94 | HR 64 | Resp 14

## 2015-12-24 DIAGNOSIS — M791 Myalgia: Secondary | ICD-10-CM | POA: Insufficient documentation

## 2015-12-24 DIAGNOSIS — M436 Torticollis: Secondary | ICD-10-CM

## 2015-12-24 DIAGNOSIS — Z79899 Other long term (current) drug therapy: Secondary | ICD-10-CM | POA: Diagnosis not present

## 2015-12-24 DIAGNOSIS — Z5181 Encounter for therapeutic drug level monitoring: Secondary | ICD-10-CM | POA: Diagnosis not present

## 2015-12-24 DIAGNOSIS — G894 Chronic pain syndrome: Secondary | ICD-10-CM | POA: Insufficient documentation

## 2015-12-24 MED ORDER — DICLOFENAC SODIUM 1 % TD GEL: TRANSDERMAL | Status: DC

## 2015-12-24 MED ORDER — HYDROCODONE-ACETAMINOPHEN 7.5-325 MG PO TABS: 1.0000 | ORAL_TABLET | Freq: Three times a day (TID) | ORAL | Status: DC | PRN

## 2015-12-24 MED ORDER — PREGABALIN 75 MG PO CAPS: 75.0000 mg | ORAL_CAPSULE | Freq: Two times a day (BID) | ORAL | Status: DC

## 2015-12-24 NOTE — Patient Instructions (Addendum)
Reduce Lyrica to 75 mg twice a day, the 3 times a day dosing may be causing some side effects which cause you to feel dizzy  Continue hydrocodone 1 tablet 3 times a day you may occasionally take 4 tablets per day on bad days

## 2015-12-24 NOTE — Progress Notes (Signed)
Procedure: Dysport injection Diagnosis: Cervical dystonia G 24.3 Dilution: 500 units in 1 mL sterile preservative-free normal saline  Informed consent was obtained after describing risks and benefits of the procedure with patient including bleeding bruising infection as well as the potential side effects of the medication itself. Patient elects to proceed and has given written consent.  Patient placed in a seated position Areas were marked and prepped with Betadine  Needle: 27-gauge 1 inch needle electrode connected to EMG amplifier  Right trapezius: 100 units Left trapezius: 100 units Left longissimus: 100 units Left splenius capitis: 100 units Left levator scapula: 100 units  Based on EMG activity, would increase left splenius capitis to 150 and reduce right trapezius to 50 units, other dosing unchanged      After the procedure called in the patient's case manager Ms. Caroll and discussed medication management with the patient. Fax copy to [phone]  Patient having side effects of drowsiness and dizziness since she increased her Lyrica to 3 times per day. Pill count today performed. She is taking less than prescribed amounts. Certainly not more than 3 per day. We will reduce her monthly number of hydrocodone from 110-100, and if she still has leftover medications next month would consider further reduction to 90  Exam Gen. No acute distress Mood and affect appropriate No evidence of lethargy currently She has some tenderness in the left upper trapezius and the left occipital area  No recent urine toxicology will need to repeat  Discussed my recommendations with patient and her case manager over half the 15 minute visit was spent counseling coordinating care

## 2015-12-31 LAB — TOXASSURE SELECT,+ANTIDEPR,UR: PDF: 0

## 2015-12-31 NOTE — Progress Notes (Signed)
Urine drug screen for this encounter is consistent for prescribed medication 

## 2016-01-06 NOTE — Telephone Encounter (Signed)
Johnette can you please close this encounter.  Thanks.

## 2016-01-10 ENCOUNTER — Emergency Department (HOSPITAL_COMMUNITY): Payer: Medicare Other

## 2016-01-10 ENCOUNTER — Emergency Department (HOSPITAL_COMMUNITY)
Admission: EM | Admit: 2016-01-10 | Discharge: 2016-01-11 | Disposition: A | Discharge status: Home / Self Care | Payer: Medicare Other | Attending: Emergency Medicine | Admitting: Emergency Medicine

## 2016-01-10 ENCOUNTER — Encounter (HOSPITAL_COMMUNITY): Payer: Self-pay | Admitting: Emergency Medicine

## 2016-01-10 DIAGNOSIS — Y998 Other external cause status: Secondary | ICD-10-CM | POA: Diagnosis not present

## 2016-01-10 DIAGNOSIS — Y9389 Activity, other specified: Secondary | ICD-10-CM | POA: Diagnosis not present

## 2016-01-10 DIAGNOSIS — Z8742 Personal history of other diseases of the female genital tract: Secondary | ICD-10-CM | POA: Insufficient documentation

## 2016-01-10 DIAGNOSIS — G43909 Migraine, unspecified, not intractable, without status migrainosus: Secondary | ICD-10-CM | POA: Diagnosis not present

## 2016-01-10 DIAGNOSIS — Z79899 Other long term (current) drug therapy: Secondary | ICD-10-CM | POA: Diagnosis not present

## 2016-01-10 DIAGNOSIS — W1839XA Other fall on same level, initial encounter: Secondary | ICD-10-CM | POA: Diagnosis not present

## 2016-01-10 DIAGNOSIS — S0990XA Unspecified injury of head, initial encounter: Secondary | ICD-10-CM | POA: Diagnosis not present

## 2016-01-10 DIAGNOSIS — Z7951 Long term (current) use of inhaled steroids: Secondary | ICD-10-CM | POA: Diagnosis not present

## 2016-01-10 DIAGNOSIS — K5900 Constipation, unspecified: Secondary | ICD-10-CM | POA: Diagnosis not present

## 2016-01-10 DIAGNOSIS — I10 Essential (primary) hypertension: Secondary | ICD-10-CM | POA: Diagnosis not present

## 2016-01-10 DIAGNOSIS — R55 Syncope and collapse: Secondary | ICD-10-CM | POA: Diagnosis not present

## 2016-01-10 DIAGNOSIS — Z87448 Personal history of other diseases of urinary system: Secondary | ICD-10-CM | POA: Diagnosis not present

## 2016-01-10 DIAGNOSIS — Y9222 Religious institution as the place of occurrence of the external cause: Secondary | ICD-10-CM | POA: Insufficient documentation

## 2016-01-10 DIAGNOSIS — F419 Anxiety disorder, unspecified: Secondary | ICD-10-CM | POA: Diagnosis not present

## 2016-01-10 DIAGNOSIS — F329 Major depressive disorder, single episode, unspecified: Secondary | ICD-10-CM | POA: Insufficient documentation

## 2016-01-10 DIAGNOSIS — G4700 Insomnia, unspecified: Secondary | ICD-10-CM | POA: Insufficient documentation

## 2016-01-10 DIAGNOSIS — Z872 Personal history of diseases of the skin and subcutaneous tissue: Secondary | ICD-10-CM | POA: Insufficient documentation

## 2016-01-10 DIAGNOSIS — W19XXXA Unspecified fall, initial encounter: Secondary | ICD-10-CM

## 2016-01-10 DIAGNOSIS — Z86018 Personal history of other benign neoplasm: Secondary | ICD-10-CM | POA: Insufficient documentation

## 2016-01-10 DIAGNOSIS — M549 Dorsalgia, unspecified: Secondary | ICD-10-CM | POA: Diagnosis not present

## 2016-01-10 DIAGNOSIS — R51 Headache: Secondary | ICD-10-CM | POA: Diagnosis not present

## 2016-01-10 DIAGNOSIS — Z791 Long term (current) use of non-steroidal anti-inflammatories (NSAID): Secondary | ICD-10-CM | POA: Insufficient documentation

## 2016-01-10 DIAGNOSIS — J45909 Unspecified asthma, uncomplicated: Secondary | ICD-10-CM | POA: Diagnosis not present

## 2016-01-10 DIAGNOSIS — M542 Cervicalgia: Secondary | ICD-10-CM | POA: Diagnosis not present

## 2016-01-10 LAB — CBC WITH DIFFERENTIAL/PLATELET
Basophils Absolute: 0 K/uL (ref 0.0–0.1)
Basophils Relative: 1 %
Eosinophils Absolute: 0.2 K/uL (ref 0.0–0.7)
Eosinophils Relative: 6 %
HCT: 36.4 % (ref 36.0–46.0)
Hemoglobin: 12.3 g/dL (ref 12.0–15.0)
Lymphocytes Relative: 52 %
Lymphs Abs: 1.7 K/uL (ref 0.7–4.0)
MCH: 29.8 pg (ref 26.0–34.0)
MCHC: 33.8 g/dL (ref 30.0–36.0)
MCV: 88.1 fL (ref 78.0–100.0)
Monocytes Absolute: 0.2 K/uL (ref 0.1–1.0)
Monocytes Relative: 6 %
Neutro Abs: 1.2 K/uL — ABNORMAL LOW (ref 1.7–7.7)
Neutrophils Relative %: 35 %
Platelets: 248 K/uL (ref 150–400)
RBC: 4.13 MIL/uL (ref 3.87–5.11)
RDW: 12.8 % (ref 11.5–15.5)
WBC: 3.3 K/uL — ABNORMAL LOW (ref 4.0–10.5)

## 2016-01-10 LAB — BASIC METABOLIC PANEL WITH GFR
Anion gap: 9 (ref 5–15)
BUN: 17 mg/dL (ref 6–20)
CO2: 28 mmol/L (ref 22–32)
Calcium: 9.4 mg/dL (ref 8.9–10.3)
Chloride: 104 mmol/L (ref 101–111)
Creatinine, Ser: 1.12 mg/dL — ABNORMAL HIGH (ref 0.44–1.00)
GFR calc Af Amer: >60 mL/min
GFR calc non Af Amer: 55 mL/min — ABNORMAL LOW
Glucose, Bld: 87 mg/dL (ref 65–99)
Potassium: 3.7 mmol/L (ref 3.5–5.1)
Sodium: 141 mmol/L (ref 135–145)

## 2016-01-10 MED ORDER — SODIUM CHLORIDE 0.9 % IV BOLUS (SEPSIS)
1000.0000 mL | Freq: Once | INTRAVENOUS | Status: AC
  Administered 2016-01-10: 1000 mL via INTRAVENOUS

## 2016-01-10 NOTE — ED Notes (Signed)
Pt returned from [facility].

## 2016-01-10 NOTE — ED Notes (Signed)
Delay in IV insertion and blood draw due to pt being in [place]

## 2016-01-10 NOTE — ED Notes (Signed)
Delay in lab draw,  Pt enroute to [facility] 

## 2016-01-10 NOTE — ED Notes (Addendum)
Pt states that she has nerve dammage on her L side and she fell yesterday. Pt still has pain on L side of her face, L arm and L leg. Neuro at baseline. Alert and oriented.
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
## 2016-01-11 DIAGNOSIS — S0990XA Unspecified injury of head, initial encounter: Secondary | ICD-10-CM | POA: Diagnosis not present

## 2016-01-11 LAB — URINALYSIS, ROUTINE W REFLEX MICROSCOPIC
Bilirubin Urine: NEGATIVE
Glucose, UA: NEGATIVE mg/dL
Hgb urine dipstick: NEGATIVE
Ketones, ur: NEGATIVE mg/dL
Leukocytes, UA: NEGATIVE
Nitrite: NEGATIVE
Protein, ur: NEGATIVE mg/dL
Specific Gravity, Urine: 1.023 (ref 1.005–1.030)
pH: 5.5 (ref 5.0–8.0)

## 2016-01-11 MED ORDER — OXYCODONE-ACETAMINOPHEN 5-325 MG PO TABS
1.0000 | ORAL_TABLET | Freq: Once | ORAL | Status: AC
  Administered 2016-01-11: 1 via ORAL
  Filled 2016-01-11: qty 1

## 2016-01-11 NOTE — Discharge Instructions (Signed)
Please read attached information. If you experience any new or worsening signs or symptoms please return to the emergency room for evaluation. Please follow-up with your primary care provider or specialist as discussed. Please use medication prescribed only as directed and discontinue taking if you have any concerning signs or symptoms.   °

## 2016-01-21 ENCOUNTER — Encounter: Payer: Worker's Compensation | Attending: Physical Medicine & Rehabilitation | Admitting: Registered Nurse

## 2016-01-21 ENCOUNTER — Encounter: Payer: Self-pay | Admitting: Registered Nurse

## 2016-01-21 DIAGNOSIS — M5412 Radiculopathy, cervical region: Secondary | ICD-10-CM | POA: Diagnosis not present

## 2016-01-21 DIAGNOSIS — M791 Myalgia: Secondary | ICD-10-CM

## 2016-01-21 DIAGNOSIS — Z981 Arthrodesis status: Secondary | ICD-10-CM | POA: Insufficient documentation

## 2016-01-21 DIAGNOSIS — Z760 Encounter for issue of repeat prescription: Secondary | ICD-10-CM | POA: Diagnosis not present

## 2016-01-21 DIAGNOSIS — M7062 Trochanteric bursitis, left hip: Secondary | ICD-10-CM | POA: Diagnosis not present

## 2016-01-21 DIAGNOSIS — M62838 Other muscle spasm: Secondary | ICD-10-CM

## 2016-01-21 DIAGNOSIS — G894 Chronic pain syndrome: Secondary | ICD-10-CM

## 2016-01-21 DIAGNOSIS — Z5181 Encounter for therapeutic drug level monitoring: Secondary | ICD-10-CM

## 2016-01-21 DIAGNOSIS — M961 Postlaminectomy syndrome, not elsewhere classified: Secondary | ICD-10-CM | POA: Insufficient documentation

## 2016-01-21 DIAGNOSIS — M4722 Other spondylosis with radiculopathy, cervical region: Secondary | ICD-10-CM | POA: Diagnosis not present

## 2016-01-21 DIAGNOSIS — M79602 Pain in left arm: Secondary | ICD-10-CM | POA: Diagnosis not present

## 2016-01-21 DIAGNOSIS — G8928 Other chronic postprocedural pain: Secondary | ICD-10-CM | POA: Diagnosis not present

## 2016-01-21 DIAGNOSIS — G8929 Other chronic pain: Secondary | ICD-10-CM | POA: Diagnosis present

## 2016-01-21 DIAGNOSIS — K5909 Other constipation: Secondary | ICD-10-CM

## 2016-01-21 DIAGNOSIS — Z79899 Other long term (current) drug therapy: Secondary | ICD-10-CM

## 2016-01-21 DIAGNOSIS — M6248 Contracture of muscle, other site: Secondary | ICD-10-CM

## 2016-01-21 DIAGNOSIS — M7918 Myalgia, other site: Secondary | ICD-10-CM

## 2016-01-21 DIAGNOSIS — Z79891 Long term (current) use of opiate analgesic: Secondary | ICD-10-CM | POA: Insufficient documentation

## 2016-01-21 MED ORDER — HYDROCODONE-ACETAMINOPHEN 7.5-325 MG PO TABS: 1.0000 | ORAL_TABLET | Freq: Four times a day (QID) | ORAL | Status: DC | PRN

## 2016-01-21 MED ORDER — PREGABALIN 100 MG PO CAPS: 100.0000 mg | ORAL_CAPSULE | Freq: Three times a day (TID) | ORAL | Status: DC

## 2016-01-21 MED ORDER — SENNOSIDES-DOCUSATE SODIUM 8.6-50 MG PO TABS: 1.0000 | ORAL_TABLET | Freq: Every day | ORAL | Status: DC
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
## 2016-01-21 NOTE — Patient Instructions (Addendum)
Increase Hydrocodone to 4 times a day    Increased  Lyrica to 100 mg Three times a day   You May increase your Tizanidine to Four times a day

## 2016-01-28 ENCOUNTER — Telehealth: Payer: Self-pay | Admitting: Physical Medicine & Rehabilitation

## 2016-01-28 ENCOUNTER — Telehealth: Payer: Self-pay

## 2016-01-28 NOTE — Telephone Encounter (Signed)
Pt called requesting her school form concerning a loan. Form is filled out and up front to be picked up. Tried calling to notify pt, but can not leave message.

## 2016-01-28 NOTE — Telephone Encounter (Signed)
Spoke with pt, informed her that her form is up front to be picked up.

## 2016-02-02 NOTE — Telephone Encounter (Signed)
Return Ms. Renato Battles call. No answer left message to return the call.

## 2016-02-07 ENCOUNTER — Other Ambulatory Visit: Payer: Self-pay | Admitting: Family Medicine

## 2016-02-15 ENCOUNTER — Encounter: Payer: Self-pay | Admitting: Family Medicine

## 2016-02-15 ENCOUNTER — Ambulatory Visit: Payer: Self-pay | Admitting: Family Medicine

## 2016-02-16 ENCOUNTER — Other Ambulatory Visit: Payer: Self-pay | Admitting: Registered Nurse

## 2016-02-22 ENCOUNTER — Encounter: Payer: Self-pay | Admitting: Family Medicine

## 2016-02-22 ENCOUNTER — Encounter: Payer: Worker's Compensation | Attending: Physical Medicine & Rehabilitation | Admitting: Registered Nurse

## 2016-02-22 ENCOUNTER — Ambulatory Visit: Payer: Self-pay | Admitting: Family Medicine

## 2016-02-22 VITALS — BP 109/70 | HR 68 | Resp 14

## 2016-02-22 DIAGNOSIS — M791 Myalgia: Secondary | ICD-10-CM

## 2016-02-22 DIAGNOSIS — Z760 Encounter for issue of repeat prescription: Secondary | ICD-10-CM | POA: Insufficient documentation

## 2016-02-22 DIAGNOSIS — M4722 Other spondylosis with radiculopathy, cervical region: Secondary | ICD-10-CM

## 2016-02-22 DIAGNOSIS — G8929 Other chronic pain: Secondary | ICD-10-CM | POA: Diagnosis present

## 2016-02-22 DIAGNOSIS — M961 Postlaminectomy syndrome, not elsewhere classified: Secondary | ICD-10-CM | POA: Insufficient documentation

## 2016-02-22 DIAGNOSIS — Z981 Arthrodesis status: Secondary | ICD-10-CM | POA: Insufficient documentation

## 2016-02-22 DIAGNOSIS — M5412 Radiculopathy, cervical region: Secondary | ICD-10-CM | POA: Insufficient documentation

## 2016-02-22 DIAGNOSIS — M7918 Myalgia, other site: Secondary | ICD-10-CM

## 2016-02-22 DIAGNOSIS — M79602 Pain in left arm: Secondary | ICD-10-CM | POA: Diagnosis not present

## 2016-02-22 DIAGNOSIS — G894 Chronic pain syndrome: Secondary | ICD-10-CM

## 2016-02-22 DIAGNOSIS — M7062 Trochanteric bursitis, left hip: Secondary | ICD-10-CM | POA: Diagnosis not present

## 2016-02-22 DIAGNOSIS — R413 Other amnesia: Secondary | ICD-10-CM

## 2016-02-22 DIAGNOSIS — G8928 Other chronic postprocedural pain: Secondary | ICD-10-CM | POA: Diagnosis not present

## 2016-02-22 DIAGNOSIS — M62838 Other muscle spasm: Secondary | ICD-10-CM

## 2016-02-22 DIAGNOSIS — M6248 Contracture of muscle, other site: Secondary | ICD-10-CM

## 2016-02-22 DIAGNOSIS — Z79891 Long term (current) use of opiate analgesic: Secondary | ICD-10-CM | POA: Diagnosis not present

## 2016-02-22 DIAGNOSIS — Z79899 Other long term (current) drug therapy: Secondary | ICD-10-CM

## 2016-02-22 DIAGNOSIS — Z5181 Encounter for therapeutic drug level monitoring: Secondary | ICD-10-CM

## 2016-02-22 MED ORDER — HYDROCODONE-ACETAMINOPHEN 7.5-325 MG PO TABS: 1.0000 | ORAL_TABLET | Freq: Four times a day (QID) | ORAL | Status: DC | PRN
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
## 2016-02-24 NOTE — Telephone Encounter (Signed)
error 

## 2016-02-25 NOTE — Telephone Encounter (Signed)
Can you please close this encounter?  Thanks.

## 2016-02-29 ENCOUNTER — Ambulatory Visit: Payer: Self-pay | Admitting: Family Medicine

## 2016-03-06 ENCOUNTER — Other Ambulatory Visit: Payer: Self-pay | Admitting: Registered Nurse

## 2016-03-14 ENCOUNTER — Ambulatory Visit: Payer: Self-pay | Admitting: Neurology

## 2016-03-15 ENCOUNTER — Other Ambulatory Visit: Payer: Self-pay | Admitting: *Deleted

## 2016-03-15 MED ORDER — ALBUTEROL SULFATE HFA 108 (90 BASE) MCG/ACT IN AERS: 2.0000 | INHALATION_SPRAY | Freq: Four times a day (QID) | RESPIRATORY_TRACT | Status: DC | PRN

## 2016-03-23 ENCOUNTER — Ambulatory Visit (HOSPITAL_BASED_OUTPATIENT_CLINIC_OR_DEPARTMENT_OTHER): Payer: Worker's Compensation | Admitting: Physical Medicine & Rehabilitation

## 2016-03-23 ENCOUNTER — Encounter: Payer: Worker's Compensation | Attending: Physical Medicine & Rehabilitation

## 2016-03-23 ENCOUNTER — Encounter: Payer: Self-pay | Admitting: Physical Medicine & Rehabilitation

## 2016-03-23 VITALS — BP 136/91 | HR 68 | Resp 16

## 2016-03-23 DIAGNOSIS — M436 Torticollis: Secondary | ICD-10-CM | POA: Diagnosis not present

## 2016-03-23 DIAGNOSIS — G243 Spasmodic torticollis: Secondary | ICD-10-CM | POA: Diagnosis not present

## 2016-03-23 DIAGNOSIS — M791 Myalgia: Secondary | ICD-10-CM | POA: Diagnosis not present

## 2016-03-23 DIAGNOSIS — Z79899 Other long term (current) drug therapy: Secondary | ICD-10-CM | POA: Insufficient documentation

## 2016-03-23 DIAGNOSIS — G894 Chronic pain syndrome: Secondary | ICD-10-CM | POA: Diagnosis present

## 2016-03-23 DIAGNOSIS — Z5181 Encounter for therapeutic drug level monitoring: Secondary | ICD-10-CM | POA: Diagnosis not present

## 2016-03-23 MED ORDER — HYDROCODONE-ACETAMINOPHEN 7.5-325 MG PO TABS: 1.0000 | ORAL_TABLET | Freq: Four times a day (QID) | ORAL | Status: DC | PRN

## 2016-03-23 NOTE — Progress Notes (Addendum)
Procedure: Dysport injection Diagnosis: Cervical dystonia G 24.3 Dilution: 500 units in 1 mL sterile preservative-free normal saline  Informed consent was obtained after describing risks and benefits of the procedure with patient including bleeding bruising infection as well as the potential side effects of the medication itself. Patient elects to proceed and has given written consent.  Patient placed in a seated position Areas were marked and prepped with Betadine  Needle: 27-gauge 1 inch needle electrode connected to EMG amplifier  Right trapezius: 50 units Left trapezius: 100 units Left longissimus: 100 units Left splenius capitis: 150 units Left levator scapula: 100 units  All injections done after negative drawback for blood. Appropriate EMG activity.      After the procedure called in the patient's case manager Ms. Caroll [phone] and discussed medication management with the patient.  We discussed that no further improvements in pain are expected over time. Will need to continue Dysport every 3 months Continue current medications F/u with NP in 1 month

## 2016-03-23 NOTE — Patient Instructions (Signed)
You received a Dysport injection today. This may cause some soreness in the muscles were injected. Should only last a couple days This injection will be repeated in 3 months

## 2016-03-24 ENCOUNTER — Ambulatory Visit: Payer: Self-pay | Admitting: Physical Medicine & Rehabilitation

## 2016-04-05 ENCOUNTER — Telehealth: Payer: Self-pay | Admitting: Physical Medicine & Rehabilitation

## 2016-04-05 NOTE — Telephone Encounter (Signed)
[facility] needs to get an okay from our office to fill patient's Trazadone early.  Patient had last refill on 03/14/16, but states she only has 1 pill left.  Please call pharmacy at [phone].

## 2016-04-05 NOTE — Telephone Encounter (Signed)
done

## 2016-04-18 ENCOUNTER — Other Ambulatory Visit: Payer: Self-pay | Admitting: Physical Medicine and Rehabilitation

## 2016-04-20 ENCOUNTER — Encounter: Payer: Self-pay | Admitting: Registered Nurse

## 2016-04-20 ENCOUNTER — Encounter: Payer: Worker's Compensation | Attending: Physical Medicine & Rehabilitation | Admitting: Registered Nurse

## 2016-04-20 VITALS — BP 119/84 | HR 65 | Resp 14

## 2016-04-20 DIAGNOSIS — G894 Chronic pain syndrome: Secondary | ICD-10-CM

## 2016-04-20 DIAGNOSIS — Z79899 Other long term (current) drug therapy: Secondary | ICD-10-CM | POA: Diagnosis not present

## 2016-04-20 DIAGNOSIS — G8928 Other chronic postprocedural pain: Secondary | ICD-10-CM | POA: Insufficient documentation

## 2016-04-20 DIAGNOSIS — Z79891 Long term (current) use of opiate analgesic: Secondary | ICD-10-CM | POA: Insufficient documentation

## 2016-04-20 DIAGNOSIS — M79602 Pain in left arm: Secondary | ICD-10-CM | POA: Diagnosis not present

## 2016-04-20 DIAGNOSIS — M5412 Radiculopathy, cervical region: Secondary | ICD-10-CM | POA: Insufficient documentation

## 2016-04-20 DIAGNOSIS — M961 Postlaminectomy syndrome, not elsewhere classified: Secondary | ICD-10-CM | POA: Diagnosis not present

## 2016-04-20 DIAGNOSIS — Z5181 Encounter for therapeutic drug level monitoring: Secondary | ICD-10-CM | POA: Diagnosis not present

## 2016-04-20 DIAGNOSIS — M7062 Trochanteric bursitis, left hip: Secondary | ICD-10-CM | POA: Insufficient documentation

## 2016-04-20 DIAGNOSIS — Z760 Encounter for issue of repeat prescription: Secondary | ICD-10-CM | POA: Diagnosis not present

## 2016-04-20 DIAGNOSIS — Z981 Arthrodesis status: Secondary | ICD-10-CM | POA: Diagnosis not present

## 2016-04-20 DIAGNOSIS — G8929 Other chronic pain: Secondary | ICD-10-CM | POA: Diagnosis present

## 2016-04-20 MED ORDER — HYDROCODONE-ACETAMINOPHEN 7.5-325 MG PO TABS: 1.0000 | ORAL_TABLET | Freq: Four times a day (QID) | ORAL | Status: DC | PRN
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
## 2016-04-27 ENCOUNTER — Other Ambulatory Visit: Payer: Self-pay | Admitting: Registered Nurse

## 2016-04-27 LAB — TOXASSURE SELECT,+ANTIDEPR,UR

## 2016-04-28 NOTE — Progress Notes (Signed)
Urine drug screen for this encounter is consistent for prescribed medication 

## 2016-05-18 ENCOUNTER — Encounter: Payer: Self-pay | Admitting: Registered Nurse

## 2016-05-18 ENCOUNTER — Encounter: Payer: Worker's Compensation | Attending: Physical Medicine & Rehabilitation | Admitting: Registered Nurse

## 2016-05-18 VITALS — BP 136/80 | HR 85 | Resp 16

## 2016-05-18 DIAGNOSIS — Z5181 Encounter for therapeutic drug level monitoring: Secondary | ICD-10-CM

## 2016-05-18 DIAGNOSIS — M7062 Trochanteric bursitis, left hip: Secondary | ICD-10-CM | POA: Diagnosis not present

## 2016-05-18 DIAGNOSIS — M7918 Myalgia, other site: Secondary | ICD-10-CM

## 2016-05-18 DIAGNOSIS — M961 Postlaminectomy syndrome, not elsewhere classified: Secondary | ICD-10-CM | POA: Diagnosis not present

## 2016-05-18 DIAGNOSIS — M79602 Pain in left arm: Secondary | ICD-10-CM | POA: Insufficient documentation

## 2016-05-18 DIAGNOSIS — M4722 Other spondylosis with radiculopathy, cervical region: Secondary | ICD-10-CM | POA: Diagnosis not present

## 2016-05-18 DIAGNOSIS — M5412 Radiculopathy, cervical region: Secondary | ICD-10-CM | POA: Insufficient documentation

## 2016-05-18 DIAGNOSIS — Z760 Encounter for issue of repeat prescription: Secondary | ICD-10-CM | POA: Insufficient documentation

## 2016-05-18 DIAGNOSIS — G8928 Other chronic postprocedural pain: Secondary | ICD-10-CM | POA: Insufficient documentation

## 2016-05-18 DIAGNOSIS — M6248 Contracture of muscle, other site: Secondary | ICD-10-CM

## 2016-05-18 DIAGNOSIS — Z981 Arthrodesis status: Secondary | ICD-10-CM | POA: Insufficient documentation

## 2016-05-18 DIAGNOSIS — G8929 Other chronic pain: Secondary | ICD-10-CM | POA: Diagnosis present

## 2016-05-18 DIAGNOSIS — Z79899 Other long term (current) drug therapy: Secondary | ICD-10-CM

## 2016-05-18 DIAGNOSIS — G894 Chronic pain syndrome: Secondary | ICD-10-CM

## 2016-05-18 DIAGNOSIS — M791 Myalgia: Secondary | ICD-10-CM

## 2016-05-18 DIAGNOSIS — Z79891 Long term (current) use of opiate analgesic: Secondary | ICD-10-CM | POA: Insufficient documentation

## 2016-05-18 DIAGNOSIS — M62838 Other muscle spasm: Secondary | ICD-10-CM

## 2016-05-18 MED ORDER — HYDROCODONE-ACETAMINOPHEN 7.5-325 MG PO TABS: 1.0000 | ORAL_TABLET | Freq: Four times a day (QID) | ORAL | Status: DC | PRN

## 2016-05-18 MED ORDER — PREGABALIN 100 MG PO CAPS: 100.0000 mg | ORAL_CAPSULE | Freq: Three times a day (TID) | ORAL | Status: DC

## 2016-05-18 MED ORDER — TIZANIDINE HCL 4 MG PO TABS: 4.0000 mg | ORAL_TABLET | Freq: Three times a day (TID) | ORAL | Status: DC

## 2016-05-18 MED ORDER — TRAZODONE HCL 150 MG PO TABS: 150.0000 mg | ORAL_TABLET | Freq: Every day | ORAL | Status: DC

## 2016-05-18 MED ORDER — DICLOFENAC SODIUM 1 % TD GEL: TRANSDERMAL | Status: DC

## 2016-05-18 NOTE — Patient Instructions (Signed)
Carpal Tunnel Syndrome Carpal tunnel syndrome is a condition that causes pain in your hand and arm. The carpal tunnel is a narrow area that is on the palm side of your wrist. Repeated wrist motion or certain diseases may cause swelling in the tunnel. This swelling can pinch the main nerve in the wrist (median nerve).  HOME CARE If You Have a Splint:  Wear it as told by your doctor. Remove it only as told by your doctor.  Loosen the splint if your fingers:  Become numb and tingle.  Turn blue and cold.  Keep the splint clean and dry. General Instructions  Take over-the-counter and prescription medicines only as told by your doctor.  Rest your wrist from any activity that may be causing your pain. If needed, talk to your employer about changes that can be made in your work, such as getting a wrist pad to use while typing.  If directed, apply ice to the painful area:  Put ice in a plastic bag.  Place a towel between your skin and the bag.  Leave the ice on for 20 minutes, 2-3 times per day.  Keep all follow-up visits as told by your doctor. This is important.  Do any exercises as told by your doctor, physical therapist, or occupational therapist. GET HELP IF:  You have new symptoms.  Medicine does not help your pain.  Your symptoms get worse.   This information is not intended to replace advice given to you by your health care provider. Make sure you discuss any questions you have with your health care provider.   Document Released: 11/23/2011 Document Revised: 08/25/2015 Document Reviewed: 04/21/2015 Elsevier Interactive Patient Education 2016 [employer]  
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
## 2016-05-24 DIAGNOSIS — Z09 Encounter for follow-up examination after completed treatment for conditions other than malignant neoplasm: Secondary | ICD-10-CM | POA: Diagnosis not present

## 2016-05-24 DIAGNOSIS — N393 Stress incontinence (female) (male): Secondary | ICD-10-CM | POA: Diagnosis not present

## 2016-06-26 ENCOUNTER — Encounter: Payer: Worker's Compensation | Attending: Physical Medicine & Rehabilitation

## 2016-06-26 ENCOUNTER — Encounter: Payer: Self-pay | Admitting: Physical Medicine & Rehabilitation

## 2016-06-26 ENCOUNTER — Ambulatory Visit (HOSPITAL_BASED_OUTPATIENT_CLINIC_OR_DEPARTMENT_OTHER): Payer: Worker's Compensation | Admitting: Physical Medicine & Rehabilitation

## 2016-06-26 VITALS — BP 126/84 | HR 71 | Resp 14

## 2016-06-26 DIAGNOSIS — M436 Torticollis: Secondary | ICD-10-CM | POA: Diagnosis not present

## 2016-06-26 DIAGNOSIS — G894 Chronic pain syndrome: Secondary | ICD-10-CM | POA: Insufficient documentation

## 2016-06-26 DIAGNOSIS — Z5181 Encounter for therapeutic drug level monitoring: Secondary | ICD-10-CM | POA: Diagnosis not present

## 2016-06-26 DIAGNOSIS — G243 Spasmodic torticollis: Secondary | ICD-10-CM

## 2016-06-26 DIAGNOSIS — M791 Myalgia: Secondary | ICD-10-CM | POA: Diagnosis not present

## 2016-06-26 DIAGNOSIS — Z79899 Other long term (current) drug therapy: Secondary | ICD-10-CM | POA: Insufficient documentation

## 2016-06-26 MED ORDER — HYDROCODONE-ACETAMINOPHEN 7.5-325 MG PO TABS: 1.0000 | ORAL_TABLET | Freq: Four times a day (QID) | ORAL | Status: DC | PRN

## 2016-06-26 NOTE — Patient Instructions (Signed)

## 2016-06-26 NOTE — Progress Notes (Signed)
Procedure: Dysport injection Diagnosis: Cervical dystonia G 24.3 Dilution: 500 units in 1 mL sterile preservative-free normal saline  Informed consent was obtained after describing risks and benefits of the procedure with patient including bleeding bruising infection as well as the potential side effects of the medication itself. Patient elects to proceed and has given written consent.  Patient placed in a seated position Areas were marked and prepped with Betadine  Needle: 27-gauge 1 inch needle electrode connected to EMG amplifier  Right trapezius: 50 units Left trapezius: 100 units Left longissimus: 100 units Left splenius capitis: 150 units Left levator scapula: 100 units  All injections done after negative drawback for blood. Appropriate EMG activity.      After the procedure called in the patient's case manager Ms. Caroll [phone] and discussed medication management with the patient.  We discussed that no further improvements in pain are expected over time. Will need to continue Dysport every 3 months Continue current medications F/u with NP in 1 month

## 2016-06-30 ENCOUNTER — Other Ambulatory Visit: Payer: Self-pay | Admitting: *Deleted

## 2016-06-30 MED ORDER — FLUTICASONE PROPIONATE HFA 220 MCG/ACT IN AERO: 1.0000 | INHALATION_SPRAY | Freq: Two times a day (BID) | RESPIRATORY_TRACT | Status: DC

## 2016-07-21 ENCOUNTER — Ambulatory Visit: Payer: Self-pay | Admitting: Registered Nurse

## 2016-07-21 ENCOUNTER — Other Ambulatory Visit: Payer: Self-pay | Admitting: Family Medicine

## 2016-07-25 ENCOUNTER — Encounter: Payer: Self-pay | Admitting: Registered Nurse

## 2016-07-25 ENCOUNTER — Encounter: Payer: Worker's Compensation | Attending: Physical Medicine & Rehabilitation | Admitting: Registered Nurse

## 2016-07-25 VITALS — BP 156/94 | HR 71

## 2016-07-25 DIAGNOSIS — Z760 Encounter for issue of repeat prescription: Secondary | ICD-10-CM | POA: Insufficient documentation

## 2016-07-25 DIAGNOSIS — Z981 Arthrodesis status: Secondary | ICD-10-CM | POA: Insufficient documentation

## 2016-07-25 DIAGNOSIS — M5412 Radiculopathy, cervical region: Secondary | ICD-10-CM | POA: Diagnosis not present

## 2016-07-25 DIAGNOSIS — Z79899 Other long term (current) drug therapy: Secondary | ICD-10-CM | POA: Diagnosis not present

## 2016-07-25 DIAGNOSIS — M7918 Myalgia, other site: Secondary | ICD-10-CM

## 2016-07-25 DIAGNOSIS — Z5181 Encounter for therapeutic drug level monitoring: Secondary | ICD-10-CM | POA: Diagnosis not present

## 2016-07-25 DIAGNOSIS — G8928 Other chronic postprocedural pain: Secondary | ICD-10-CM | POA: Insufficient documentation

## 2016-07-25 DIAGNOSIS — M7062 Trochanteric bursitis, left hip: Secondary | ICD-10-CM | POA: Insufficient documentation

## 2016-07-25 DIAGNOSIS — Z79891 Long term (current) use of opiate analgesic: Secondary | ICD-10-CM | POA: Insufficient documentation

## 2016-07-25 DIAGNOSIS — M791 Myalgia: Secondary | ICD-10-CM

## 2016-07-25 DIAGNOSIS — G243 Spasmodic torticollis: Secondary | ICD-10-CM

## 2016-07-25 DIAGNOSIS — M961 Postlaminectomy syndrome, not elsewhere classified: Secondary | ICD-10-CM | POA: Diagnosis not present

## 2016-07-25 DIAGNOSIS — G8929 Other chronic pain: Secondary | ICD-10-CM | POA: Diagnosis present

## 2016-07-25 DIAGNOSIS — M79602 Pain in left arm: Secondary | ICD-10-CM | POA: Insufficient documentation

## 2016-07-25 DIAGNOSIS — M62838 Other muscle spasm: Secondary | ICD-10-CM

## 2016-07-25 DIAGNOSIS — G894 Chronic pain syndrome: Secondary | ICD-10-CM

## 2016-07-25 DIAGNOSIS — M6248 Contracture of muscle, other site: Secondary | ICD-10-CM | POA: Diagnosis not present

## 2016-07-25 DIAGNOSIS — M4722 Other spondylosis with radiculopathy, cervical region: Secondary | ICD-10-CM | POA: Diagnosis not present

## 2016-07-25 MED ORDER — TIZANIDINE HCL 4 MG PO TABS: 4.0000 mg | ORAL_TABLET | Freq: Three times a day (TID) | ORAL | 2 refills | Status: DC

## 2016-07-25 MED ORDER — HYDROCODONE-ACETAMINOPHEN 7.5-325 MG PO TABS: 1.0000 | ORAL_TABLET | Freq: Four times a day (QID) | ORAL | 0 refills | Status: DC | PRN

## 2016-07-25 MED ORDER — PREGABALIN 100 MG PO CAPS: 100.0000 mg | ORAL_CAPSULE | Freq: Three times a day (TID) | ORAL | 3 refills | Status: DC
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
## 2016-07-31 ENCOUNTER — Other Ambulatory Visit: Payer: Self-pay | Admitting: Family Medicine

## 2016-08-01 ENCOUNTER — Other Ambulatory Visit: Payer: Self-pay | Admitting: Family Medicine

## 2016-08-01 ENCOUNTER — Other Ambulatory Visit: Payer: Self-pay | Admitting: Registered Nurse

## 2016-08-22 ENCOUNTER — Other Ambulatory Visit: Payer: Self-pay | Admitting: Registered Nurse

## 2016-08-24 ENCOUNTER — Encounter: Payer: Worker's Compensation | Attending: Physical Medicine & Rehabilitation | Admitting: Registered Nurse

## 2016-08-24 ENCOUNTER — Ambulatory Visit: Payer: Self-pay | Admitting: Registered Nurse

## 2016-08-24 ENCOUNTER — Encounter: Payer: Self-pay | Admitting: Registered Nurse

## 2016-08-24 VITALS — BP 134/88 | HR 82 | Resp 14

## 2016-08-24 DIAGNOSIS — G8928 Other chronic postprocedural pain: Secondary | ICD-10-CM | POA: Diagnosis not present

## 2016-08-24 DIAGNOSIS — G243 Spasmodic torticollis: Secondary | ICD-10-CM

## 2016-08-24 DIAGNOSIS — Z5181 Encounter for therapeutic drug level monitoring: Secondary | ICD-10-CM | POA: Diagnosis not present

## 2016-08-24 DIAGNOSIS — Z79891 Long term (current) use of opiate analgesic: Secondary | ICD-10-CM | POA: Insufficient documentation

## 2016-08-24 DIAGNOSIS — M791 Myalgia: Secondary | ICD-10-CM | POA: Diagnosis not present

## 2016-08-24 DIAGNOSIS — Z760 Encounter for issue of repeat prescription: Secondary | ICD-10-CM | POA: Diagnosis not present

## 2016-08-24 DIAGNOSIS — Z981 Arthrodesis status: Secondary | ICD-10-CM | POA: Diagnosis not present

## 2016-08-24 DIAGNOSIS — M7918 Myalgia, other site: Secondary | ICD-10-CM

## 2016-08-24 DIAGNOSIS — Z79899 Other long term (current) drug therapy: Secondary | ICD-10-CM

## 2016-08-24 DIAGNOSIS — G5602 Carpal tunnel syndrome, left upper limb: Secondary | ICD-10-CM

## 2016-08-24 DIAGNOSIS — M5412 Radiculopathy, cervical region: Secondary | ICD-10-CM | POA: Diagnosis not present

## 2016-08-24 DIAGNOSIS — M7062 Trochanteric bursitis, left hip: Secondary | ICD-10-CM | POA: Diagnosis not present

## 2016-08-24 DIAGNOSIS — M961 Postlaminectomy syndrome, not elsewhere classified: Secondary | ICD-10-CM

## 2016-08-24 DIAGNOSIS — G894 Chronic pain syndrome: Secondary | ICD-10-CM

## 2016-08-24 DIAGNOSIS — G8929 Other chronic pain: Secondary | ICD-10-CM | POA: Diagnosis present

## 2016-08-24 DIAGNOSIS — M79602 Pain in left arm: Secondary | ICD-10-CM | POA: Diagnosis not present

## 2016-08-24 DIAGNOSIS — M4722 Other spondylosis with radiculopathy, cervical region: Secondary | ICD-10-CM

## 2016-08-24 MED ORDER — SENNOSIDES-DOCUSATE SODIUM 8.6-50 MG PO TABS: 1.0000 | ORAL_TABLET | Freq: Every day | ORAL | 5 refills | Status: DC

## 2016-08-24 MED ORDER — HYDROCODONE-ACETAMINOPHEN 7.5-325 MG PO TABS: 1.0000 | ORAL_TABLET | Freq: Four times a day (QID) | ORAL | 0 refills | Status: DC | PRN
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
## 2016-08-31 ENCOUNTER — Other Ambulatory Visit: Payer: Self-pay | Admitting: Registered Nurse

## 2016-09-01 LAB — TOXASSURE SELECT,+ANTIDEPR,UR

## 2016-09-06 NOTE — Progress Notes (Signed)
Urine drug screen for this encounter is consistent for prescribed medications.   

## 2016-09-26 ENCOUNTER — Encounter: Payer: Worker's Compensation | Attending: Physical Medicine & Rehabilitation

## 2016-09-26 ENCOUNTER — Ambulatory Visit (HOSPITAL_BASED_OUTPATIENT_CLINIC_OR_DEPARTMENT_OTHER): Payer: Worker's Compensation | Admitting: Physical Medicine & Rehabilitation

## 2016-09-26 VITALS — BP 132/86 | HR 72 | Resp 14

## 2016-09-26 DIAGNOSIS — Z5181 Encounter for therapeutic drug level monitoring: Secondary | ICD-10-CM | POA: Diagnosis not present

## 2016-09-26 DIAGNOSIS — G894 Chronic pain syndrome: Secondary | ICD-10-CM | POA: Insufficient documentation

## 2016-09-26 DIAGNOSIS — Z79899 Other long term (current) drug therapy: Secondary | ICD-10-CM | POA: Diagnosis not present

## 2016-09-26 DIAGNOSIS — G243 Spasmodic torticollis: Secondary | ICD-10-CM

## 2016-09-26 DIAGNOSIS — G5602 Carpal tunnel syndrome, left upper limb: Secondary | ICD-10-CM | POA: Diagnosis not present

## 2016-09-26 DIAGNOSIS — M436 Torticollis: Secondary | ICD-10-CM | POA: Diagnosis not present

## 2016-09-26 DIAGNOSIS — M791 Myalgia: Secondary | ICD-10-CM | POA: Diagnosis not present

## 2016-09-26 MED ORDER — TIZANIDINE HCL 4 MG PO TABS: 4.0000 mg | ORAL_TABLET | Freq: Three times a day (TID) | ORAL | 2 refills | Status: DC

## 2016-09-26 MED ORDER — PREGABALIN 100 MG PO CAPS: 100.0000 mg | ORAL_CAPSULE | Freq: Three times a day (TID) | ORAL | 3 refills | Status: DC

## 2016-09-26 MED ORDER — HYDROCODONE-ACETAMINOPHEN 7.5-325 MG PO TABS: 1.0000 | ORAL_TABLET | Freq: Four times a day (QID) | ORAL | 0 refills | Status: DC | PRN

## 2016-09-26 MED ORDER — DICLOFENAC SODIUM 1 % TD GEL: TRANSDERMAL | 2 refills | Status: DC

## 2016-09-26 MED ORDER — TRAZODONE HCL 150 MG PO TABS: 150.0000 mg | ORAL_TABLET | Freq: Every day | ORAL | 2 refills | Status: DC

## 2016-09-26 NOTE — Progress Notes (Signed)
Procedure: Dysport injection Diagnosis: Cervical dystonia G 24.3 Dilution: 500 units in 1 mL sterile preservative-free normal saline   Informed consent was obtained after describing risks and benefits of the procedure with patient including bleeding bruising infection as well as the potential side effects of the medication itself. Patient elects to proceed and has given written consent.   Patient placed in a seated position Areas were marked and prepped with Betadine   Needle: 27-gauge 1 inch needle electrode connected to EMG amplifier   Right trapezius: 50 units Left trapezius: 100 units Left longissimus: 100 units Left splenius capitis: 150 units Left levator scapula: 100 units   All injections done after negative drawback for blood. Appropriate EMG activity.     After the procedure called in the patient's case manager Ms. Caroll [phone] and discussed medication management with the patient.  If no EMG/NCV report available would repeat BUE study

## 2016-09-26 NOTE — Patient Instructions (Addendum)
Please get copy of prior EMG/NCV, I'll need to review to assess need for repeat study

## 2016-10-04 ENCOUNTER — Telehealth: Payer: Self-pay | Admitting: Physical Medicine & Rehabilitation

## 2016-10-04 NOTE — Telephone Encounter (Signed)
Patient has spoken to her case manager about a Thermal unit that goes on side of tub--that will help her when [facility].  She is not sure if she spoke to Dr. Letta Pate about it, but would like a prescription for it.  Please call patient.

## 2016-10-05 ENCOUNTER — Telehealth: Payer: Self-pay | Admitting: *Deleted

## 2016-10-05 NOTE — Telephone Encounter (Signed)
I'm worried that a wrist splint on the right wrist may restrict her movement too much. Her symptoms are worse on the left side

## 2016-10-05 NOTE — Telephone Encounter (Signed)
notified

## 2016-10-05 NOTE — Telephone Encounter (Signed)
I'm really not recommending that

## 2016-10-05 NOTE — Telephone Encounter (Signed)
Maria Griffith has a question also about her wrist splint.  She goes Monday to have this made.  She has it in bot wrists and is wondering if she should have one for both wrists

## 2016-10-24 ENCOUNTER — Ambulatory Visit (HOSPITAL_BASED_OUTPATIENT_CLINIC_OR_DEPARTMENT_OTHER): Payer: Worker's Compensation | Admitting: Physical Medicine & Rehabilitation

## 2016-10-24 ENCOUNTER — Ambulatory Visit: Payer: Self-pay | Admitting: Registered Nurse

## 2016-10-24 ENCOUNTER — Encounter: Payer: Worker's Compensation | Attending: Physical Medicine & Rehabilitation

## 2016-10-24 ENCOUNTER — Encounter: Payer: Self-pay | Admitting: Physical Medicine & Rehabilitation

## 2016-10-24 VITALS — BP 131/86 | HR 73 | Resp 14

## 2016-10-24 DIAGNOSIS — M791 Myalgia: Secondary | ICD-10-CM | POA: Diagnosis not present

## 2016-10-24 DIAGNOSIS — Z79899 Other long term (current) drug therapy: Secondary | ICD-10-CM | POA: Diagnosis not present

## 2016-10-24 DIAGNOSIS — M436 Torticollis: Secondary | ICD-10-CM | POA: Diagnosis not present

## 2016-10-24 DIAGNOSIS — G5600 Carpal tunnel syndrome, unspecified upper limb: Secondary | ICD-10-CM

## 2016-10-24 DIAGNOSIS — Z5181 Encounter for therapeutic drug level monitoring: Secondary | ICD-10-CM | POA: Insufficient documentation

## 2016-10-24 DIAGNOSIS — G5602 Carpal tunnel syndrome, left upper limb: Secondary | ICD-10-CM

## 2016-10-24 DIAGNOSIS — G894 Chronic pain syndrome: Secondary | ICD-10-CM | POA: Insufficient documentation

## 2016-10-24 HISTORY — DX: Carpal tunnel syndrome, unspecified upper limb: G56.00

## 2016-10-24 MED ORDER — HYDROCODONE-ACETAMINOPHEN 7.5-325 MG PO TABS: 1.0000 | ORAL_TABLET | Freq: Four times a day (QID) | ORAL | 0 refills | Status: DC | PRN

## 2016-10-24 NOTE — Progress Notes (Signed)
Carpal tunnel injection   Indication: Median neuropathy at the wrist documented by EMG or ultrasound and interfering with sleep and other functional activities. Symptoms are not relieved by conservative care.  Informed consent was obtained after describing risks and benefits of the procedure with the patient. These include bleeding bruising and infection as well as nerve injury. Patient elected to proceed and has given written consent. The distal wrist crease was marked and prepped with Betadine. A 30-gauge 1/2 inch needle was inserted and 0.25 ML's of 1% lidocaine injected into the skin and subcutaneous tissue. Then a 30-gauge 1/2 inch needle was inserted into the carpal tunnel and 0.25 mL of depomedrol 40 mg per mL was injected. Patient tolerated procedure well. Post procedure instructions given.

## 2016-10-24 NOTE — Patient Instructions (Addendum)
Carpal tunnel injection performed left wrist with Celestone, a type of cortisone as well as lidocaine itches a numbing medicine. This may last up to 12 weeks. It should help with some numbness, tingling, pain in the wrist and finger area. He can be repeated every 12 weeks as needed.  Next Botox injection should be at the beginning of January  Eunice next months and me for the Botox in January

## 2016-10-27 ENCOUNTER — Other Ambulatory Visit: Payer: Self-pay | Admitting: Family Medicine

## 2016-10-30 ENCOUNTER — Telehealth: Payer: Self-pay | Admitting: *Deleted

## 2016-10-30 NOTE — Telephone Encounter (Signed)
Requesting a call back but did not say why.  I called back but had to leave a message

## 2016-10-31 MED ORDER — METHYLPREDNISOLONE 4 MG PO TBPK: ORAL_TABLET | ORAL | 0 refills | Status: DC

## 2016-10-31 NOTE — Telephone Encounter (Signed)
Med sent to pharmacy and message left for [facility]

## 2016-10-31 NOTE — Telephone Encounter (Signed)
Every time she walks and steps with left leg there is sharp stabbing pain. It seems to be worse every time she gets up to walk.  And left wrist is still hurting. This, in spite of taking pain medication. Please advise.
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
## 2016-10-31 NOTE — Telephone Encounter (Signed)
Patient called back, having pain in her left hand and left leg.  It hurts when she tries to get up and walk.  Please call patient at [phone].

## 2016-11-01 ENCOUNTER — Telehealth: Payer: Self-pay

## 2016-11-01 NOTE — Telephone Encounter (Signed)
Left message to give us a call back

## 2016-11-04 ENCOUNTER — Other Ambulatory Visit: Payer: Self-pay | Admitting: Family Medicine

## 2016-11-15 ENCOUNTER — Encounter: Payer: Self-pay | Admitting: *Deleted

## 2016-11-15 ENCOUNTER — Telehealth: Payer: Self-pay | Admitting: *Deleted

## 2016-11-15 NOTE — Telephone Encounter (Signed)
Tried calling patient to make an appointment but there was no VM available.  Letter mailed asking her to call our office and schedule an appointment. Rohen Kimes,CMA

## 2016-11-15 NOTE — Telephone Encounter (Signed)
-----   Message from Kinnie [name], MD sent at 11/07/2016  5:27 AM EST ----- Please call patient.  Medication refill request not completed. She has not been seen here in more than a year. She need follow up.

## 2016-11-21 NOTE — Telephone Encounter (Signed)
completed

## 2016-11-22 ENCOUNTER — Encounter: Payer: Worker's Compensation | Admitting: Registered Nurse

## 2016-11-22 ENCOUNTER — Telehealth: Payer: Self-pay

## 2016-11-22 NOTE — Telephone Encounter (Signed)
Patient called and stated that she will be able to make her appointment.

## 2016-11-24 ENCOUNTER — Ambulatory Visit (HOSPITAL_BASED_OUTPATIENT_CLINIC_OR_DEPARTMENT_OTHER): Payer: Worker's Compensation | Admitting: Physical Medicine & Rehabilitation

## 2016-11-24 ENCOUNTER — Encounter: Payer: Self-pay | Admitting: Physical Medicine & Rehabilitation

## 2016-11-24 ENCOUNTER — Encounter: Payer: Worker's Compensation | Attending: Physical Medicine & Rehabilitation

## 2016-11-24 VITALS — BP 163/111 | HR 78

## 2016-11-24 DIAGNOSIS — M4722 Other spondylosis with radiculopathy, cervical region: Secondary | ICD-10-CM

## 2016-11-24 DIAGNOSIS — Z79899 Other long term (current) drug therapy: Secondary | ICD-10-CM | POA: Diagnosis not present

## 2016-11-24 DIAGNOSIS — M436 Torticollis: Secondary | ICD-10-CM | POA: Diagnosis not present

## 2016-11-24 DIAGNOSIS — G894 Chronic pain syndrome: Secondary | ICD-10-CM | POA: Insufficient documentation

## 2016-11-24 DIAGNOSIS — G243 Spasmodic torticollis: Secondary | ICD-10-CM

## 2016-11-24 DIAGNOSIS — M791 Myalgia: Secondary | ICD-10-CM | POA: Diagnosis not present

## 2016-11-24 DIAGNOSIS — Z5181 Encounter for therapeutic drug level monitoring: Secondary | ICD-10-CM | POA: Insufficient documentation

## 2016-11-24 MED ORDER — HYDROCODONE-ACETAMINOPHEN 7.5-325 MG PO TABS: 1.0000 | ORAL_TABLET | Freq: Four times a day (QID) | ORAL | 0 refills | Status: DC | PRN
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
## 2016-12-18 ENCOUNTER — Inpatient Hospital Stay (HOSPITAL_COMMUNITY)
Admission: EM | Admit: 2016-12-18 | Discharge: 2016-12-23 | DRG: 193 | Disposition: A | Discharge status: Home / Self Care | Payer: Medicare Other | Attending: Internal Medicine | Admitting: Internal Medicine

## 2016-12-18 ENCOUNTER — Emergency Department (HOSPITAL_COMMUNITY): Payer: Medicare Other

## 2016-12-18 ENCOUNTER — Encounter (HOSPITAL_COMMUNITY): Payer: Self-pay | Admitting: Family Medicine

## 2016-12-18 DIAGNOSIS — F329 Major depressive disorder, single episode, unspecified: Secondary | ICD-10-CM | POA: Diagnosis present

## 2016-12-18 DIAGNOSIS — Z981 Arthrodesis status: Secondary | ICD-10-CM

## 2016-12-18 DIAGNOSIS — F331 Major depressive disorder, recurrent, moderate: Secondary | ICD-10-CM

## 2016-12-18 DIAGNOSIS — G43109 Migraine with aura, not intractable, without status migrainosus: Secondary | ICD-10-CM | POA: Diagnosis present

## 2016-12-18 DIAGNOSIS — J020 Streptococcal pharyngitis: Secondary | ICD-10-CM | POA: Diagnosis present

## 2016-12-18 DIAGNOSIS — F4310 Post-traumatic stress disorder, unspecified: Secondary | ICD-10-CM | POA: Diagnosis not present

## 2016-12-18 DIAGNOSIS — N179 Acute kidney failure, unspecified: Secondary | ICD-10-CM

## 2016-12-18 DIAGNOSIS — J9601 Acute respiratory failure with hypoxia: Secondary | ICD-10-CM | POA: Diagnosis present

## 2016-12-18 DIAGNOSIS — E860 Dehydration: Secondary | ICD-10-CM | POA: Diagnosis present

## 2016-12-18 DIAGNOSIS — F419 Anxiety disorder, unspecified: Secondary | ICD-10-CM | POA: Diagnosis present

## 2016-12-18 DIAGNOSIS — J101 Influenza due to other identified influenza virus with other respiratory manifestations: Secondary | ICD-10-CM

## 2016-12-18 DIAGNOSIS — E876 Hypokalemia: Secondary | ICD-10-CM

## 2016-12-18 DIAGNOSIS — N289 Disorder of kidney and ureter, unspecified: Secondary | ICD-10-CM

## 2016-12-18 DIAGNOSIS — G8929 Other chronic pain: Secondary | ICD-10-CM | POA: Diagnosis present

## 2016-12-18 DIAGNOSIS — K5900 Constipation, unspecified: Secondary | ICD-10-CM | POA: Diagnosis present

## 2016-12-18 DIAGNOSIS — B950 Streptococcus, group A, as the cause of diseases classified elsewhere: Secondary | ICD-10-CM | POA: Diagnosis present

## 2016-12-18 DIAGNOSIS — R0600 Dyspnea, unspecified: Secondary | ICD-10-CM

## 2016-12-18 DIAGNOSIS — J45901 Unspecified asthma with (acute) exacerbation: Secondary | ICD-10-CM | POA: Diagnosis not present

## 2016-12-18 DIAGNOSIS — I517 Cardiomegaly: Secondary | ICD-10-CM | POA: Diagnosis not present

## 2016-12-18 DIAGNOSIS — I10 Essential (primary) hypertension: Secondary | ICD-10-CM

## 2016-12-18 DIAGNOSIS — J4541 Moderate persistent asthma with (acute) exacerbation: Secondary | ICD-10-CM | POA: Diagnosis not present

## 2016-12-18 DIAGNOSIS — Z7951 Long term (current) use of inhaled steroids: Secondary | ICD-10-CM | POA: Diagnosis not present

## 2016-12-18 DIAGNOSIS — R0902 Hypoxemia: Secondary | ICD-10-CM

## 2016-12-18 DIAGNOSIS — R05 Cough: Secondary | ICD-10-CM | POA: Diagnosis not present

## 2016-12-18 DIAGNOSIS — Z888 Allergy status to other drugs, medicaments and biological substances status: Secondary | ICD-10-CM | POA: Diagnosis not present

## 2016-12-18 DIAGNOSIS — J4540 Moderate persistent asthma, uncomplicated: Secondary | ICD-10-CM | POA: Diagnosis not present

## 2016-12-18 HISTORY — DX: Influenza due to other identified influenza virus with other respiratory manifestations: J10.1

## 2016-12-18 HISTORY — DX: Hypokalemia: E87.6

## 2016-12-18 LAB — URINALYSIS, ROUTINE W REFLEX MICROSCOPIC
Bilirubin Urine: NEGATIVE
Glucose, UA: NEGATIVE mg/dL
Ketones, ur: 5 mg/dL — AB
Leukocytes, UA: NEGATIVE
Nitrite: NEGATIVE
Protein, ur: NEGATIVE mg/dL
Specific Gravity, Urine: 1.012 (ref 1.005–1.030)
pH: 5 (ref 5.0–8.0)

## 2016-12-18 LAB — CBC WITH DIFFERENTIAL/PLATELET
Basophils Absolute: 0 K/uL (ref 0.0–0.1)
Basophils Relative: 1 %
Eosinophils Absolute: 0.1 K/uL (ref 0.0–0.7)
Eosinophils Relative: 1 %
HCT: 36.9 % (ref 36.0–46.0)
Hemoglobin: 12.8 g/dL (ref 12.0–15.0)
Lymphocytes Relative: 37 %
Lymphs Abs: 2.3 K/uL (ref 0.7–4.0)
MCH: 28.6 pg (ref 26.0–34.0)
MCHC: 34.7 g/dL (ref 30.0–36.0)
MCV: 82.6 fL (ref 78.0–100.0)
Monocytes Absolute: 0.5 K/uL (ref 0.1–1.0)
Monocytes Relative: 8 %
Neutro Abs: 3.3 K/uL (ref 1.7–7.7)
Neutrophils Relative %: 53 %
Platelets: 255 K/uL (ref 150–400)
RBC: 4.47 MIL/uL (ref 3.87–5.11)
RDW: 13.9 % (ref 11.5–15.5)
WBC: 6.2 K/uL (ref 4.0–10.5)

## 2016-12-18 LAB — BASIC METABOLIC PANEL
Anion gap: 13 (ref 5–15)
BUN: 48 mg/dL — ABNORMAL HIGH (ref 6–20)
CHLORIDE: 99 mmol/L — AB (ref 101–111)
CO2: 29 mmol/L (ref 22–32)
CREATININE: 1.73 mg/dL — AB (ref 0.44–1.00)
Calcium: 9.1 mg/dL (ref 8.9–10.3)
GFR calc non Af Amer: 32 mL/min — ABNORMAL LOW (ref >60)
GFR, EST AFRICAN AMERICAN: 37 mL/min — AB (ref >60)
Glucose, Bld: 98 mg/dL (ref 65–99)
POTASSIUM: 2.9 mmol/L — AB (ref 3.5–5.1)
Sodium: 141 mmol/L (ref 135–145)

## 2016-12-18 LAB — I-STAT CG4 LACTIC ACID, ED
Lactic Acid, Venous: 1.33 mmol/L (ref 0.5–1.9)
Lactic Acid, Venous: 1.64 mmol/L (ref 0.5–1.9)

## 2016-12-18 LAB — RAPID STREP SCREEN (MED CTR MEBANE ONLY): STREPTOCOCCUS, GROUP A SCREEN (DIRECT): NEGATIVE

## 2016-12-18 LAB — INFLUENZA PANEL BY PCR (TYPE A & B)
Influenza A By PCR: POSITIVE — AB
Influenza B By PCR: NEGATIVE

## 2016-12-18 MED ORDER — SENNOSIDES-DOCUSATE SODIUM 8.6-50 MG PO TABS
1.0000 | ORAL_TABLET | Freq: Every day | ORAL | Status: DC
  Administered 2016-12-19 – 2016-12-22 (×5): 1 via ORAL
  Filled 2016-12-18 (×5): qty 1

## 2016-12-18 MED ORDER — ACETAMINOPHEN 500 MG PO TABS
1000.0000 mg | ORAL_TABLET | Freq: Once | ORAL | Status: AC
  Administered 2016-12-18: 1000 mg via ORAL
  Filled 2016-12-18: qty 2

## 2016-12-18 MED ORDER — DICLOFENAC SODIUM 1 % TD GEL: 2.0000 g | Freq: Four times a day (QID) | TRANSDERMAL | Status: DC | PRN

## 2016-12-18 MED ORDER — PREGABALIN 50 MG PO CAPS
100.0000 mg | ORAL_CAPSULE | Freq: Three times a day (TID) | ORAL | Status: DC
  Administered 2016-12-19 – 2016-12-23 (×15): 100 mg via ORAL
  Filled 2016-12-18 (×2): qty 2
  Filled 2016-12-18: qty 1
  Filled 2016-12-18 (×6): qty 2
  Filled 2016-12-18: qty 1
  Filled 2016-12-18 (×5): qty 2

## 2016-12-18 MED ORDER — POTASSIUM CHLORIDE IN NACL 40-0.9 MEQ/L-% IV SOLN
INTRAVENOUS | Status: DC
  Administered 2016-12-19: 100 mL/h via INTRAVENOUS
  Filled 2016-12-18: qty 1000

## 2016-12-18 MED ORDER — ONDANSETRON HCL 4 MG/2ML IJ SOLN: 4.0000 mg | Freq: Four times a day (QID) | INTRAMUSCULAR | Status: DC | PRN

## 2016-12-18 MED ORDER — LORAZEPAM 2 MG/ML IJ SOLN
0.5000 mg | Freq: Once | INTRAMUSCULAR | Status: AC
  Administered 2016-12-18: 0.5 mg via INTRAVENOUS
  Filled 2016-12-18: qty 1

## 2016-12-18 MED ORDER — IPRATROPIUM-ALBUTEROL 0.5-2.5 (3) MG/3ML IN SOLN
3.0000 mL | Freq: Once | RESPIRATORY_TRACT | Status: AC
  Administered 2016-12-18: 3 mL via RESPIRATORY_TRACT
  Filled 2016-12-18: qty 3

## 2016-12-18 MED ORDER — DIPHENHYDRAMINE HCL 50 MG/ML IJ SOLN: 12.5000 mg | Freq: Once | INTRAMUSCULAR | Status: DC

## 2016-12-18 MED ORDER — HYDROCOD POLST-CPM POLST ER 10-8 MG/5ML PO SUER
5.0000 mL | Freq: Once | ORAL | Status: AC
  Administered 2016-12-18: 5 mL via ORAL
  Filled 2016-12-18: qty 5

## 2016-12-18 MED ORDER — ALBUTEROL (5 MG/ML) CONTINUOUS INHALATION SOLN
10.0000 mg/h | INHALATION_SOLUTION | Freq: Once | RESPIRATORY_TRACT | Status: AC
  Administered 2016-12-18: 10 mg/h via RESPIRATORY_TRACT
  Filled 2016-12-18: qty 20

## 2016-12-18 MED ORDER — IPRATROPIUM-ALBUTEROL 0.5-2.5 (3) MG/3ML IN SOLN
RESPIRATORY_TRACT | Status: AC
  Administered 2016-12-18: 3 mL via RESPIRATORY_TRACT
  Filled 2016-12-18: qty 3

## 2016-12-18 MED ORDER — ENOXAPARIN SODIUM 40 MG/0.4ML ~~LOC~~ SOLN
40.0000 mg | SUBCUTANEOUS | Status: DC
  Administered 2016-12-19 – 2016-12-23 (×5): 40 mg via SUBCUTANEOUS
  Filled 2016-12-18 (×5): qty 0.4

## 2016-12-18 MED ORDER — KETOROLAC TROMETHAMINE 30 MG/ML IJ SOLN
30.0000 mg | Freq: Once | INTRAMUSCULAR | Status: AC
  Administered 2016-12-18: 30 mg via INTRAVENOUS
  Filled 2016-12-18: qty 1

## 2016-12-18 MED ORDER — OSELTAMIVIR PHOSPHATE 75 MG PO CAPS
75.0000 mg | ORAL_CAPSULE | Freq: Two times a day (BID) | ORAL | Status: DC
  Administered 2016-12-18 – 2016-12-19 (×2): 75 mg via ORAL
  Filled 2016-12-18 (×2): qty 1

## 2016-12-18 MED ORDER — POTASSIUM CHLORIDE CRYS ER 20 MEQ PO TBCR
40.0000 meq | EXTENDED_RELEASE_TABLET | Freq: Once | ORAL | Status: AC
  Administered 2016-12-18: 40 meq via ORAL
  Filled 2016-12-18: qty 2

## 2016-12-18 MED ORDER — ONDANSETRON HCL 4 MG PO TABS
4.0000 mg | ORAL_TABLET | Freq: Four times a day (QID) | ORAL | Status: DC | PRN
Start: 2016-12-18 — End: 2016-12-23

## 2016-12-18 MED ORDER — ACETAMINOPHEN 650 MG RE SUPP: 650.0000 mg | Freq: Four times a day (QID) | RECTAL | Status: DC | PRN

## 2016-12-18 MED ORDER — TRAZODONE HCL 100 MG PO TABS
150.0000 mg | ORAL_TABLET | Freq: Every day | ORAL | Status: DC
  Administered 2016-12-19 – 2016-12-22 (×5): 150 mg via ORAL
  Filled 2016-12-18 (×4): qty 1
  Filled 2016-12-18: qty 3

## 2016-12-18 MED ORDER — FLUTICASONE PROPIONATE HFA 220 MCG/ACT IN AERO: 1.0000 | INHALATION_SPRAY | Freq: Two times a day (BID) | RESPIRATORY_TRACT | Status: DC

## 2016-12-18 MED ORDER — IPRATROPIUM-ALBUTEROL 0.5-2.5 (3) MG/3ML IN SOLN
3.0000 mL | Freq: Once | RESPIRATORY_TRACT | Status: AC
  Administered 2016-12-18: 3 mL via RESPIRATORY_TRACT

## 2016-12-18 MED ORDER — AMLODIPINE BESYLATE 5 MG PO TABS
5.0000 mg | ORAL_TABLET | Freq: Every day | ORAL | Status: DC
  Administered 2016-12-19 – 2016-12-23 (×5): 5 mg via ORAL
  Filled 2016-12-18 (×5): qty 1

## 2016-12-18 MED ORDER — TIZANIDINE HCL 4 MG PO TABS
4.0000 mg | ORAL_TABLET | Freq: Three times a day (TID) | ORAL | Status: DC
  Administered 2016-12-19 – 2016-12-23 (×15): 4 mg via ORAL
  Filled 2016-12-18 (×15): qty 1

## 2016-12-18 MED ORDER — ALBUTEROL SULFATE (2.5 MG/3ML) 0.083% IN NEBU
5.0000 mg | INHALATION_SOLUTION | Freq: Once | RESPIRATORY_TRACT | Status: AC
  Administered 2016-12-18: 5 mg via RESPIRATORY_TRACT
  Filled 2016-12-18: qty 6

## 2016-12-18 MED ORDER — OXYBUTYNIN CHLORIDE ER 5 MG PO TB24
20.0000 mg | ORAL_TABLET | Freq: Every day | ORAL | Status: DC
  Administered 2016-12-19 – 2016-12-22 (×5): 20 mg via ORAL
  Filled 2016-12-18 (×5): qty 4

## 2016-12-18 MED ORDER — ACETAMINOPHEN 325 MG PO TABS: 650.0000 mg | ORAL_TABLET | Freq: Four times a day (QID) | ORAL | Status: DC | PRN

## 2016-12-18 MED ORDER — HYDROCODONE-ACETAMINOPHEN 7.5-325 MG PO TABS
1.0000 | ORAL_TABLET | Freq: Four times a day (QID) | ORAL | Status: DC | PRN
Start: 2016-12-18 — End: 2016-12-23
  Administered 2016-12-19 – 2016-12-22 (×7): 1 via ORAL
  Filled 2016-12-18 (×7): qty 1

## 2016-12-18 MED ORDER — ALBUTEROL SULFATE (2.5 MG/3ML) 0.083% IN NEBU: 2.5000 mg | INHALATION_SOLUTION | Freq: Four times a day (QID) | RESPIRATORY_TRACT | Status: DC

## 2016-12-18 MED ORDER — ALBUTEROL SULFATE (2.5 MG/3ML) 0.083% IN NEBU: 2.5000 mg | INHALATION_SOLUTION | RESPIRATORY_TRACT | Status: DC | PRN

## 2016-12-18 MED ORDER — METHYLPREDNISOLONE SODIUM SUCC 125 MG IJ SOLR
125.0000 mg | Freq: Once | INTRAMUSCULAR | Status: AC
  Administered 2016-12-18: 125 mg via INTRAVENOUS
  Filled 2016-12-18: qty 2

## 2016-12-18 MED ORDER — SODIUM CHLORIDE 0.9 % IV BOLUS (SEPSIS)
1000.0000 mL | Freq: Once | INTRAVENOUS | Status: AC
  Administered 2016-12-18: 1000 mL via INTRAVENOUS

## 2016-12-18 MED ORDER — BUDESONIDE 0.5 MG/2ML IN SUSP
1.0000 mg | Freq: Two times a day (BID) | RESPIRATORY_TRACT | Status: DC
  Administered 2016-12-19: 09:00:00 1 mg via RESPIRATORY_TRACT
  Administered 2016-12-19: 0.5 mg via RESPIRATORY_TRACT
  Administered 2016-12-20 – 2016-12-23 (×7): 1 mg via RESPIRATORY_TRACT
  Filled 2016-12-18 (×9): qty 4
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
## 2016-12-18 NOTE — ED Triage Notes (Signed)
Respiratory therapy at bedside.

## 2016-12-18 NOTE — ED Notes (Signed)
Bed: [id number] Expected date:  Expected time:  Means of arrival:  Comments: TR

## 2016-12-18 NOTE — ED Triage Notes (Signed)
Pt presents w/ c/o productive cough, sob, nasal congestion, and body aches that started 12/26. Pt denies fevers at home. Pt reports hx of asthma.

## 2016-12-18 NOTE — ED Provider Notes (Signed)
MSE was initiated and I personally evaluated the patient and placed orders (if any) at  2:29 PM on December 18, 2016.  The patient appears stable so that the remainder of the MSE may be completed by another provider.  Maria Griffith is a 56 y.o. female with PMHx of asthma and HTN who presents to the Emergency Department complaining of SOB that began six days ago. She reports associated productive cough, orthopnea, burning in her chest, nasal congestion and generalized body aches. She reports subjective fever, vomiting, rhinorrhea and decreased appetite. Pt reports she was recently exposed to her daughter whom has pneumonia and her grandchildren that have similar symptoms. She has used her albuterol MDI with no significant relief of her symptoms. Exertion and lying down increase her SOB. She denies alleviating factors. She denies difficulty urinating. She is not a smoker and is not on home oxygen.  Patient seen and evaluated the bedside, tachypnea tach and with increased work of respiratory effort after nebulizer treatment. States she does not feel improved, will need to be transferred to higher level of care for continuous nebulizer, she's dehydrated and needs more evaluation via blood work.   Monico Blitz, PA-C 12/18/16 [place], MD 12/18/16 [phone]

## 2016-12-18 NOTE — Progress Notes (Signed)
Family Medicine Teaching Service Note of Declined Transfer  Patient was initially accepted for transfer to [facility] at (time): 1949  Our inpatient service requires that patients transferring to [facility] must arrive at [facility] within two hours of acceptance for transfer.  As it is now 2159, and the transfer time exceeds two hours, we are unable to accept this patient for transfer.  Name of ED provider notified: Clayton Bibles, [place] Time of notification: 2159  Mercy Riding, MD PGY-2, Family Medicine Teaching Service Service Pager [phone]

## 2016-12-18 NOTE — ED Notes (Signed)
Pt ambulated to restroom with cane and one assist. 

## 2016-12-18 NOTE — ED Triage Notes (Signed)
Pt continues to have productive cough. Respiratory therapy notified of need to assess this pt.

## 2016-12-18 NOTE — ED Notes (Signed)
[facility] called for transport. 

## 2016-12-18 NOTE — ED Notes (Signed)
Ambulated pt with pulse oxi o2 dropped to 90 briefly then went back to 95-96. But pt had labored breathing and complained of sob and light-headed and weakness.

## 2016-12-18 NOTE — Progress Notes (Signed)
Family Medicine Teaching Service Transfer Acceptance Note  Brief summary of reason for admission/transfer: asthma exacerbation with desaturation to 90% Time accepted for transfer: 7:49 PM  ED provider with whom patient discussed: Clayton Bibles PA  Please note that patients transferring to [facility] under the [place] must arrive at [facility] within two hours of acceptance for transfer.  Mercy Riding, MD PGY-2, Family Medicine Teaching Service Service Pager [phone]
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
## 2016-12-19 DIAGNOSIS — J4541 Moderate persistent asthma with (acute) exacerbation: Secondary | ICD-10-CM

## 2016-12-19 DIAGNOSIS — F4310 Post-traumatic stress disorder, unspecified: Secondary | ICD-10-CM

## 2016-12-19 DIAGNOSIS — J45901 Unspecified asthma with (acute) exacerbation: Secondary | ICD-10-CM

## 2016-12-19 LAB — URINALYSIS, ROUTINE W REFLEX MICROSCOPIC
Bilirubin Urine: NEGATIVE
Glucose, UA: NEGATIVE mg/dL
Hgb urine dipstick: NEGATIVE
Ketones, ur: NEGATIVE mg/dL
Leukocytes, UA: NEGATIVE
Nitrite: NEGATIVE
Protein, ur: NEGATIVE mg/dL
Specific Gravity, Urine: 1.018 (ref 1.005–1.030)
pH: 5 (ref 5.0–8.0)

## 2016-12-19 LAB — BASIC METABOLIC PANEL WITH GFR
Anion gap: 11 (ref 5–15)
BUN: 39 mg/dL — ABNORMAL HIGH (ref 6–20)
CO2: 26 mmol/L (ref 22–32)
Calcium: 8.8 mg/dL — ABNORMAL LOW (ref 8.9–10.3)
Chloride: 105 mmol/L (ref 101–111)
Creatinine, Ser: 1.48 mg/dL — ABNORMAL HIGH (ref 0.44–1.00)
GFR calc Af Amer: 45 mL/min — ABNORMAL LOW
GFR calc non Af Amer: 39 mL/min — ABNORMAL LOW
Glucose, Bld: 172 mg/dL — ABNORMAL HIGH (ref 65–99)
Potassium: 3.6 mmol/L (ref 3.5–5.1)
Sodium: 142 mmol/L (ref 135–145)

## 2016-12-19 LAB — CBC
HEMATOCRIT: 32.7 % — AB (ref 36.0–46.0)
HEMOGLOBIN: 11.2 g/dL — AB (ref 12.0–15.0)
MCH: 28.9 pg (ref 26.0–34.0)
MCHC: 34.3 g/dL (ref 30.0–36.0)
MCV: 84.5 fL (ref 78.0–100.0)
Platelets: 227 10*3/uL (ref 150–400)
RBC: 3.87 MIL/uL (ref 3.87–5.11)
RDW: 14.4 % (ref 11.5–15.5)
WBC: 4.7 10*3/uL (ref 4.0–10.5)

## 2016-12-19 LAB — CREATININE, URINE, RANDOM: Creatinine, Urine: 109.74 mg/dL

## 2016-12-19 LAB — SODIUM, URINE, RANDOM: SODIUM UR: 61 mmol/L

## 2016-12-19 LAB — MAGNESIUM: Magnesium: 2.6 mg/dL — ABNORMAL HIGH (ref 1.7–2.4)

## 2016-12-19 MED ORDER — METHYLPREDNISOLONE SODIUM SUCC 125 MG IJ SOLR
125.0000 mg | Freq: Four times a day (QID) | INTRAMUSCULAR | Status: DC
  Administered 2016-12-19 – 2016-12-20 (×3): 125 mg via INTRAVENOUS
  Filled 2016-12-19 (×3): qty 2

## 2016-12-19 MED ORDER — SODIUM CHLORIDE 0.9 % IV SOLN: INTRAVENOUS | Status: DC

## 2016-12-19 MED ORDER — ALBUTEROL SULFATE (2.5 MG/3ML) 0.083% IN NEBU
2.5000 mg | INHALATION_SOLUTION | Freq: Three times a day (TID) | RESPIRATORY_TRACT | Status: DC
  Administered 2016-12-19: 2.5 mg via RESPIRATORY_TRACT
  Filled 2016-12-19: qty 3

## 2016-12-19 MED ORDER — OSELTAMIVIR PHOSPHATE 30 MG PO CAPS
30.0000 mg | ORAL_CAPSULE | Freq: Two times a day (BID) | ORAL | Status: DC
  Administered 2016-12-19 – 2016-12-21 (×4): 30 mg via ORAL
  Filled 2016-12-19 (×5): qty 1

## 2016-12-19 MED ORDER — ALBUTEROL SULFATE (2.5 MG/3ML) 0.083% IN NEBU
2.5000 mg | INHALATION_SOLUTION | RESPIRATORY_TRACT | Status: DC | PRN
  Administered 2016-12-19: 2.5 mg via RESPIRATORY_TRACT

## 2016-12-19 MED ORDER — SODIUM CHLORIDE 0.9 % IV SOLN
INTRAVENOUS | Status: DC
  Administered 2016-12-19 – 2016-12-21 (×5): via INTRAVENOUS

## 2016-12-19 MED ORDER — IPRATROPIUM-ALBUTEROL 0.5-2.5 (3) MG/3ML IN SOLN
3.0000 mL | Freq: Four times a day (QID) | RESPIRATORY_TRACT | Status: DC
  Administered 2016-12-19: 3 mL via RESPIRATORY_TRACT
  Filled 2016-12-19: qty 3

## 2016-12-19 MED ORDER — INFLUENZA VAC SPLIT QUAD 0.5 ML IM SUSY
0.5000 mL | PREFILLED_SYRINGE | INTRAMUSCULAR | Status: DC
  Filled 2016-12-19: qty 0.5

## 2016-12-19 MED ORDER — LORAZEPAM 2 MG/ML IJ SOLN
0.5000 mg | Freq: Four times a day (QID) | INTRAMUSCULAR | Status: DC | PRN
Start: 2016-12-19 — End: 2016-12-23
  Administered 2016-12-19 – 2016-12-22 (×2): 0.5 mg via INTRAVENOUS
  Filled 2016-12-19 (×3): qty 1

## 2016-12-19 MED ORDER — PANTOPRAZOLE SODIUM 40 MG PO TBEC
40.0000 mg | DELAYED_RELEASE_TABLET | Freq: Every day | ORAL | Status: DC
  Administered 2016-12-19 – 2016-12-23 (×5): 40 mg via ORAL
  Filled 2016-12-19 (×5): qty 1

## 2016-12-19 MED ORDER — GI COCKTAIL ~~LOC~~
30.0000 mL | Freq: Three times a day (TID) | ORAL | Status: DC | PRN
  Filled 2016-12-19: qty 30

## 2016-12-19 MED ORDER — HYDROCODONE-HOMATROPINE 5-1.5 MG/5ML PO SYRP: 5.0000 mL | ORAL_SOLUTION | ORAL | Status: DC | PRN

## 2016-12-19 MED ORDER — ALBUTEROL SULFATE (2.5 MG/3ML) 0.083% IN NEBU
2.5000 mg | INHALATION_SOLUTION | Freq: Four times a day (QID) | RESPIRATORY_TRACT | Status: DC
Start: 2016-12-19 — End: 2016-12-19
  Filled 2016-12-19: qty 3

## 2016-12-19 MED ORDER — ARFORMOTEROL TARTRATE 15 MCG/2ML IN NEBU
15.0000 ug | INHALATION_SOLUTION | Freq: Two times a day (BID) | RESPIRATORY_TRACT | Status: DC
  Administered 2016-12-19 – 2016-12-23 (×8): 15 ug via RESPIRATORY_TRACT
  Filled 2016-12-19 (×9): qty 2

## 2016-12-19 MED ORDER — GUAIFENESIN ER 600 MG PO TB12
1200.0000 mg | ORAL_TABLET | Freq: Two times a day (BID) | ORAL | Status: DC
  Administered 2016-12-19 – 2016-12-23 (×8): 1200 mg via ORAL
  Filled 2016-12-19 (×8): qty 2

## 2016-12-19 MED ORDER — IPRATROPIUM-ALBUTEROL 0.5-2.5 (3) MG/3ML IN SOLN
3.0000 mL | Freq: Three times a day (TID) | RESPIRATORY_TRACT | Status: DC
  Administered 2016-12-20: 3 mL via RESPIRATORY_TRACT
  Filled 2016-12-19: qty 3

## 2016-12-19 MED ORDER — ARFORMOTEROL TARTRATE 15 MCG/2ML IN NEBU
15.0000 ug | INHALATION_SOLUTION | Freq: Two times a day (BID) | RESPIRATORY_TRACT | Status: DC
  Filled 2016-12-19: qty 2

## 2016-12-19 MED ORDER — POTASSIUM CHLORIDE CRYS ER 20 MEQ PO TBCR
40.0000 meq | EXTENDED_RELEASE_TABLET | Freq: Once | ORAL | Status: AC
  Administered 2016-12-19: 40 meq via ORAL
  Filled 2016-12-19: qty 2

## 2016-12-19 NOTE — Progress Notes (Signed)
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 Patient noted to be positive for influenza a per PCR. Patient also noted acute asthma exacerbation and patient noted to be in acute renal failure with a creatinine of 1.73 as well as hypokalemic.   Assessment & Plan:   Principal Problem:   AKI (acute kidney injury) ([place]) Active Problems:   Essential hypertension   Asthma, moderate persistent   MDD (major depressive disorder), recurrent episode, moderate (HCC)   Post traumatic stress disorder (PTSD)   Hypokalemia   Influenza A  #1 acute kidney injury/acute renal failure Likely secondary to a prerenal azotemia in the setting of ARB and diuretic. ARB and diuretic on hold. Renal function slowly trending down. Continue IV fluids. Follow.  #2 influenza A Continue Tamiflu. IV fluids. Supportive care.  #3 hypokalemia Replete.  #4 acute asthma exacerbation Likely triggered by influenza A. Patient with poor to fair air movement with diffuse wheezing. Will place patient on Solu-Medrol 125 mg IV every 6 hours. Place on Mucinex, Pulmicort and Brovana. Scheduled nebulizers. Continue O2. Follow.  #5 hypertension BP stable. Continue low-dose Norvasc.  #6 major depressive disorder Stable.   DVT prophylaxis: Lovenox Code Status: Full  Family Communication: Updated patient and family at bedside. Disposition Plan: Home once renal function has improved and back to baseline, asthma exacerbation has improved and patient moving air well.   Consultants:   None  Procedures:  Chest x-ray 12/18/2016    Antimicrobials:   Tamiflu  12/18/2016   Subjective: Patient states not feeling too well. Patient with cough.  Objective: Vitals:   12/19/16 0000 12/19/16 0554 12/19/16 0851 12/19/16 0853  BP:  125/87    Pulse:  88    Resp:  20    Temp:  98.5 F (36.9 C)    TempSrc:  Oral    SpO2:  99% 99% 99%  Weight: 78.4 kg (172 lb 13.5 oz)     Height: 5\' 1"  (1.549 m)       Intake/Output Summary (Last 24 hours) at 12/19/16 1328 Last data filed at 12/19/16 1015  Gross per 24 hour  Intake          1131.67 ml  Output              200 ml  Net           931.67 ml   Filed Weights   12/19/16 0000  Weight: 78.4 kg (172 lb 13.5 oz)    Examination:  General exam: Appears calm and comfortable  Respiratory system: Poor to fair air movement. Diffuse wheezing. Respiratory effort normal. Cardiovascular system: S1 & S2 heard, RRR. No JVD, murmurs, rubs, gallops or clicks. No pedal edema. Gastrointestinal system: Abdomen is nondistended, soft and nontender. No organomegaly or masses felt. Normal bowel sounds heard. Central nervous system: Alert and oriented. No focal neurological deficits. Extremities: Symmetric 5 x 5 power. Skin: No rashes, lesions or ulcers Psychiatry: Judgement and insight appear normal. Mood & affect appropriate.     Data Reviewed: I have personally reviewed following labs and imaging studies  CBC:  Recent Labs Lab 12/18/16 1437 12/19/16 0116  WBC 6.2 4.7  NEUTROABS 3.3  --  HGB 12.8 11.2*  HCT 36.9 32.7*  MCV 82.6 84.5  PLT 255 [id number]   Basic Metabolic Panel:  Recent Labs Lab 12/18/16 1437 12/19/16 0116  NA 141 142  K 2.9* 3.6  CL 99* 105  CO2 29 26  GLUCOSE 98 172*  BUN 48* 39*  CREATININE 1.73* 1.48*  CALCIUM 9.1 8.8*  MG  --  2.6*   GFR: Estimated Creatinine Clearance: 41.2 mL/min (by C-G formula based on SCr of 1.48 mg/dL (H)). Liver Function Tests: No results for input(s): AST, ALT, ALKPHOS, BILITOT, PROT, ALBUMIN in the last 168 hours. No results for input(s): LIPASE,  AMYLASE in the last 168 hours. No results for input(s): AMMONIA in the last 168 hours. Coagulation Profile: No results for input(s): INR, PROTIME in the last 168 hours. Cardiac Enzymes: No results for input(s): CKTOTAL, CKMB, CKMBINDEX, TROPONINI in the last 168 hours. BNP (last 3 results) No results for input(s): PROBNP in the last 8760 hours. HbA1C: No results for input(s): HGBA1C in the last 72 hours. CBG: No results for input(s): GLUCAP in the last 168 hours. Lipid Profile: No results for input(s): CHOL, HDL, LDLCALC, TRIG, CHOLHDL, LDLDIRECT in the last 72 hours. Thyroid Function Tests: No results for input(s): TSH, T4TOTAL, FREET4, T3FREE, THYROIDAB in the last 72 hours. Anemia Panel: No results for input(s): VITAMINB12, FOLATE, FERRITIN, TIBC, IRON, RETICCTPCT in the last 72 hours. Sepsis Labs:  Recent Labs Lab 12/18/16 1449 12/18/16 1800  LATICACIDVEN 1.33 1.64    Recent Results (from the past 240 hour(s))  Rapid strep screen     Status: None   Collection Time: 12/18/16  1:03 PM  Result Value Ref Range Status   Streptococcus, Group A Screen (Direct) NEGATIVE NEGATIVE Final    Comment: (NOTE) A Rapid Antigen test may result negative if the antigen level in the sample is below the detection level of this test. The FDA has not cleared this test as a stand-alone test therefore the rapid antigen negative result has reflexed to a Group A Strep culture.   Culture, group A strep     Status: None (Preliminary result)   Collection Time: 12/18/16  1:03 PM  Result Value Ref Range Status   Specimen Description THROAT  Final   Special Requests NONE Reflexed from [id number]  Final   Culture   Final    CULTURE REINCUBATED FOR BETTER GROWTH Performed at [facility]    Report Status PENDING  Incomplete         Radiology Studies: Dg Chest 2 View  Result Date: 12/18/2016 CLINICAL DATA:  56 year old female with productive cough shortness of breath congestion and body  ache for 2 weeks. Initial encounter. EXAM: CHEST  2 VIEW COMPARISON:  10/17/2013. FINDINGS: Stable mild to moderate cardiomegaly. Other mediastinal contours are within normal limits. Visualized tracheal air column is within normal limits. Previous cervical ACDF again noted. No pneumothorax, pulmonary edema, pleural effusion or confluent pulmonary opacity. Questionable increased pulmonary interstitium diffusely. No acute osseous abnormality identified. Negative visible bowel gas pattern. IMPRESSION: 1. Suggestion of mild diffuse interstitial opacity raising the possibility of viral or atypical respiratory infection. No pleural effusion or other pulmonary abnormality. 2. Chronic mild to moderate cardiomegaly. Electronically Signed   By: Genevie Ann M.D.   On: 12/18/2016 14:05        Scheduled Meds: . albuterol  2.5 mg Nebulization Q6H  . amLODipine  5 mg Oral Daily  . arformoterol  15 mcg Nebulization BID  . budesonide (PULMICORT) nebulizer  solution  1 mg Nebulization BID  . enoxaparin (LOVENOX) injection  40 mg Subcutaneous Q24H  . guaiFENesin  1,200 mg Oral BID  . [START ON 12/20/2016] Influenza vac split quadrivalent PF  0.5 mL Intramuscular Tomorrow-1000  . methylPREDNISolone (SOLU-MEDROL) injection  125 mg Intravenous Q6H  . oseltamivir  30 mg Oral BID  . oxybutynin  20 mg Oral QHS  . pregabalin  100 mg Oral TID  . senna-docusate  1 tablet Oral QHS  . tiZANidine  4 mg Oral TID  . traZODone  150 mg Oral QHS   Continuous Infusions: . sodium chloride 125 mL/hr at 12/19/16 0922     LOS: 1 day    Time spent: 60 mins    Kaison Mcparland, MD [facility] Pager [phone]  If 7PM-7AM, please contact night-coverage [email or web address] Password [email or web address] 12/19/2016, 1:28 PM

## 2016-12-19 NOTE — Progress Notes (Signed)
Pharmacy - Brief Note  tamiflu for influenza  Current renal function - est CrCl = 3ml/min  Plan:  Change tamilfu to 30mg  PO BID   Doreene Eland, PharmD, BCPS.   Pager: [phone] 12/19/2016 11:42 AM

## 2016-12-20 LAB — CBC
HCT: 32.2 % — ABNORMAL LOW (ref 36.0–46.0)
HEMOGLOBIN: 10.7 g/dL — AB (ref 12.0–15.0)
MCH: 28.8 pg (ref 26.0–34.0)
MCHC: 33.2 g/dL (ref 30.0–36.0)
MCV: 86.8 fL (ref 78.0–100.0)
Platelets: 223 10*3/uL (ref 150–400)
RBC: 3.71 MIL/uL — AB (ref 3.87–5.11)
RDW: 15 % (ref 11.5–15.5)
WBC: 6.2 10*3/uL (ref 4.0–10.5)

## 2016-12-20 LAB — BASIC METABOLIC PANEL
Anion gap: 8 (ref 5–15)
BUN: 28 mg/dL — AB (ref 6–20)
CHLORIDE: 106 mmol/L (ref 101–111)
CO2: 23 mmol/L (ref 22–32)
Calcium: 8.5 mg/dL — ABNORMAL LOW (ref 8.9–10.3)
Creatinine, Ser: 1.24 mg/dL — ABNORMAL HIGH (ref 0.44–1.00)
GFR calc Af Amer: 56 mL/min — ABNORMAL LOW (ref >60)
GFR calc non Af Amer: 48 mL/min — ABNORMAL LOW (ref >60)
GLUCOSE: 130 mg/dL — AB (ref 65–99)
POTASSIUM: 4.2 mmol/L (ref 3.5–5.1)
Sodium: 137 mmol/L (ref 135–145)

## 2016-12-20 LAB — CULTURE, GROUP A STREP (THRC)

## 2016-12-20 MED ORDER — GUAIFENESIN-DM 100-10 MG/5ML PO SYRP
5.0000 mL | ORAL_SOLUTION | ORAL | Status: DC | PRN
  Administered 2016-12-21 (×2): 5 mL via ORAL
  Filled 2016-12-20 (×3): qty 10

## 2016-12-20 MED ORDER — METHYLPREDNISOLONE SODIUM SUCC 125 MG IJ SOLR
80.0000 mg | Freq: Four times a day (QID) | INTRAMUSCULAR | Status: DC
  Administered 2016-12-20 – 2016-12-21 (×3): 80 mg via INTRAVENOUS
  Filled 2016-12-20 (×3): qty 2

## 2016-12-20 MED ORDER — IPRATROPIUM-ALBUTEROL 0.5-2.5 (3) MG/3ML IN SOLN
3.0000 mL | Freq: Four times a day (QID) | RESPIRATORY_TRACT | Status: DC
  Administered 2016-12-20 – 2016-12-23 (×12): 3 mL via RESPIRATORY_TRACT
  Filled 2016-12-20 (×12): qty 3

## 2016-12-20 MED ORDER — LORATADINE 10 MG PO TABS
10.0000 mg | ORAL_TABLET | Freq: Every day | ORAL | Status: DC
  Administered 2016-12-20 – 2016-12-23 (×4): 10 mg via ORAL
  Filled 2016-12-20 (×4): qty 1

## 2016-12-20 NOTE — Progress Notes (Signed)
Patient called for the nurse to assess her IV and reported that something is wrong with it.  When the nurse arrived, a large amount of blood noted on the floor, her gown, and towels. Apparently patient tried to stop the blood on her own. While the nurse assisted the patient, a hair band noted around wrist.  Patient denied that she is suicidal.  Ac, Joe and the NP, K. Schorr were notified.

## 2016-12-20 NOTE — Progress Notes (Signed)
   12/20/16 0153  Vitals  Temp 97.5 F (36.4 C)  Temp Source Oral  BP (!) 132/97  BP Location Left Arm  BP Method Automatic  Patient Position (if appropriate) Lying  Pulse Rate 61  Pulse Rate Source Dinamap  Resp 18  Oxygen Therapy  SpO2 99 %  O2 Device Room Air  Taken after the loss of the IV access.

## 2016-12-20 NOTE — Progress Notes (Addendum)
PROGRESS NOTE                                                                                                                                                                                                             Patient Demographics:    Maria Griffith, is a 56 y.o. female, DOB - 1961-07-06, [id number]  Admit date - 12/18/2016   Admitting Physician Edwin Dada, MD  Outpatient Primary MD for the patient is Andrena Mews, MD  LOS - 2  Outpatient Specialists: none  Chief Complaint  Patient presents with  . URI  . Cough       Brief Narrative   56 year old female with history of chronic pain, moderate persistent asthma, major depression/PTSD presented with cough with wheezing, myalgia and chills. She was positive for influenza A and admitted for acute hypoxic respiratory failure with associated asthma exacerbation.   Subjective:   Patient still coughing and wheezy. Maintaining O2 sat on room air.  Assessment  & Plan :    Principal Problem: Acute hypoxic respiratory failure secondary to influenza A and acute asthma exacerbation Continue Tamiflu. Supportive care with Tylenol and antitussives. IV Solu-Medrol, scheduled DuoNeb's, oxygen as needed. Slow improvement. Will reduce dose of Solu-Medrol to 80 mg every 6 hours, increased frequency of DuoNeb to every 6 hours.  Assessment need for home O2 and nebulizer upon discharge.    AKI (acute kidney injury) ([place]) Suspect prerenal azotemia. Holding Benicar. Slowly improving with IV fluids.   Active Problems:   Essential hypertension Stable. Continue amlodipine. Holding Benicar    Asthma, moderate persistent, with exacerbation  Continue IV Solu-Medrol and DuoNeb's as above. Patient informs that symptoms have been more frequent over the past few months.   Hypokalemia Replenished    MDD (major depressive disorder), recurrent episode, moderate (HCC)  Post traumatic stress disorder (PTSD) Continue home medications  Acute confusion She reportedly was getting out of bed and had ?pulled her IV line out with large amount of blood noted on the floor and her gown. Patient was trying to stop the bleeding on her own. Her IV line came out again this morning (as per nurse this is the fourth time patient has removed her IV access). She does not appear confused or disoriented today. Monitor for now.      Code Status : Full code  Family Communication  : None at bedside  Disposition Plan  : Home once clinically improved, possibly in the next 48 hours  Barriers For Discharge : Active symptoms  Consults  :  None  Procedures  : None  DVT Prophylaxis  :  Lovenox   Lab Results  Component Value Date   PLT 223 12/20/2016    Antibiotics  :  Anti-infectives    Start     Dose/Rate Route Frequency Ordered Stop   12/19/16 2200  oseltamivir (TAMIFLU) capsule 30 mg     30 mg Oral 2 times daily 12/19/16 1140 12/23/16 2159   12/18/16 2245  oseltamivir (TAMIFLU) capsule 75 mg  Status:  Discontinued     75 mg Oral 2 times daily 12/18/16 2244 12/19/16 1140        Objective:   Vitals:   12/20/16 0153 12/20/16 0515 12/20/16 0932 12/20/16 0936  BP: (!) 132/97 (!) 140/98    Pulse: 61 (!) 51    Resp: 18 16    Temp: 97.5 F (36.4 C) 97.7 F (36.5 C)    TempSrc: Oral Oral    SpO2: 99% 100% 98% 98%  Weight:      Height:        Wt Readings from Last 3 Encounters:  12/19/16 78.4 kg (172 lb 13.5 oz)  09/07/15 76.2 kg (168 lb)  09/01/15 76.2 kg (168 lb)     Intake/Output Summary (Last 24 hours) at 12/20/16 1317 Last data filed at 12/20/16 1017  Gross per 24 hour  Intake          3059.17 ml  Output              300 ml  Net          2759.17 ml     Physical Exam  Gen: coughing recurrently HEENT:  moist mucosa, supple neck Chest: Diffuse wheezing bilaterally CVS: N S1&S2, no murmurs GI: soft, NT, ND,  Musculoskeletal: warm, no  edema     Data Review:    CBC  Recent Labs Lab 12/18/16 1437 12/19/16 0116 12/20/16 0505  WBC 6.2 4.7 6.2  HGB 12.8 11.2* 10.7*  HCT 36.9 32.7* 32.2*  PLT 255 227 223  MCV 82.6 84.5 86.8  MCH 28.6 28.9 28.8  MCHC 34.7 34.3 33.2  RDW 13.9 14.4 15.0  LYMPHSABS 2.3  --   --   MONOABS 0.5  --   --   EOSABS 0.1  --   --   BASOSABS 0.0  --   --     Chemistries   Recent Labs Lab 12/18/16 1437 12/19/16 0116 12/20/16 0505  NA 141 142 137  K 2.9* 3.6 4.2  CL 99* 105 106  CO2 29 26 23   GLUCOSE 98 172* 130*  BUN 48* 39* 28*  CREATININE 1.73* 1.48* 1.24*  CALCIUM 9.1 8.8* 8.5*  MG  --  2.6*  --    ------------------------------------------------------------------------------------------------------------------ No results for input(s): CHOL, HDL, LDLCALC, TRIG, CHOLHDL, LDLDIRECT in the last 72 hours.  Lab Results  Component Value Date   HGBA1C 5.6 10/17/2013   ------------------------------------------------------------------------------------------------------------------ No results for input(s): TSH, T4TOTAL, T3FREE, THYROIDAB in the last 72 hours.  Invalid input(s): FREET3 ------------------------------------------------------------------------------------------------------------------ No results for input(s): VITAMINB12, FOLATE, FERRITIN, TIBC, IRON, RETICCTPCT in the last 72 hours.  Coagulation profile No results for input(s): INR, PROTIME in the last 168 hours.  No results for input(s): DDIMER in the last 72 hours.  Cardiac Enzymes No results for input(s): CKMB,  TROPONINI, MYOGLOBIN in the last 168 hours.  Invalid input(s): CK ------------------------------------------------------------------------------------------------------------------ No results found for: BNP  Inpatient Medications  Scheduled Meds: . amLODipine  5 mg Oral Daily  . arformoterol  15 mcg Nebulization BID  . budesonide (PULMICORT) nebulizer solution  1 mg Nebulization BID  .  enoxaparin (LOVENOX) injection  40 mg Subcutaneous Q24H  . guaiFENesin  1,200 mg Oral BID  . Influenza vac split quadrivalent PF  0.5 mL Intramuscular Tomorrow-1000  . ipratropium-albuterol  3 mL Nebulization Q6H  . loratadine  10 mg Oral Daily  . methylPREDNISolone (SOLU-MEDROL) injection  80 mg Intravenous Q6H  . oseltamivir  30 mg Oral BID  . oxybutynin  20 mg Oral QHS  . pantoprazole  40 mg Oral Q0600  . pregabalin  100 mg Oral TID  . senna-docusate  1 tablet Oral QHS  . tiZANidine  4 mg Oral TID  . traZODone  150 mg Oral QHS   Continuous Infusions: . sodium chloride 100 mL/hr at 12/20/16 1052   PRN Meds:.acetaminophen **OR** acetaminophen, albuterol, diclofenac sodium, gi cocktail, guaiFENesin-dextromethorphan, HYDROcodone-acetaminophen, HYDROcodone-homatropine, LORazepam, ondansetron **OR** ondansetron (ZOFRAN) IV  Micro Results Recent Results (from the past 240 hour(s))  Rapid strep screen     Status: None   Collection Time: 12/18/16  1:03 PM  Result Value Ref Range Status   Streptococcus, Group A Screen (Direct) NEGATIVE NEGATIVE Final    Comment: (NOTE) A Rapid Antigen test may result negative if the antigen level in the sample is below the detection level of this test. The FDA has not cleared this test as a stand-alone test therefore the rapid antigen negative result has reflexed to a Group A Strep culture.   Culture, group A strep     Status: None (Preliminary result)   Collection Time: 12/18/16  1:03 PM  Result Value Ref Range Status   Specimen Description THROAT  Final   Special Requests NONE Reflexed from [id number]  Final   Culture   Final    CULTURE REINCUBATED FOR BETTER GROWTH Performed at [facility]    Report Status PENDING  Incomplete    Radiology Reports Dg Chest 2 View  Result Date: 12/18/2016 CLINICAL DATA:  56 year old female with productive cough shortness of breath congestion and body ache for 2 weeks. Initial encounter. EXAM: CHEST  2 VIEW  COMPARISON:  10/17/2013. FINDINGS: Stable mild to moderate cardiomegaly. Other mediastinal contours are within normal limits. Visualized tracheal air column is within normal limits. Previous cervical ACDF again noted. No pneumothorax, pulmonary edema, pleural effusion or confluent pulmonary opacity. Questionable increased pulmonary interstitium diffusely. No acute osseous abnormality identified. Negative visible bowel gas pattern. IMPRESSION: 1. Suggestion of mild diffuse interstitial opacity raising the possibility of viral or atypical respiratory infection. No pleural effusion or other pulmonary abnormality. 2. Chronic mild to moderate cardiomegaly. Electronically Signed   By: Genevie Ann M.D.   On: 12/18/2016 14:05    Time Spent in minutes  35   Louellen Molder M.D on 12/20/2016 at 1:17 PM  Between 7am to 7pm - Pager - [phone]  After 7pm go to [email or web address] - password [email or web address]  [facility] -  Office  [phone]

## 2016-12-20 NOTE — Progress Notes (Signed)
IV line was occluded again, talked with patient about pulling out her lines, and loosening tape. Advised patient not to do that as she will be needing IV fluids and medications.

## 2016-12-21 ENCOUNTER — Inpatient Hospital Stay (HOSPITAL_COMMUNITY): Payer: Medicare Other

## 2016-12-21 LAB — BASIC METABOLIC PANEL
Anion gap: 5 (ref 5–15)
BUN: 22 mg/dL — AB (ref 6–20)
CHLORIDE: 109 mmol/L (ref 101–111)
CO2: 24 mmol/L (ref 22–32)
Calcium: 8.4 mg/dL — ABNORMAL LOW (ref 8.9–10.3)
Creatinine, Ser: 0.77 mg/dL (ref 0.44–1.00)
GFR calc Af Amer: >60 mL/min (ref >60)
Glucose, Bld: 133 mg/dL — ABNORMAL HIGH (ref 65–99)
POTASSIUM: 4.4 mmol/L (ref 3.5–5.1)
Sodium: 138 mmol/L (ref 135–145)

## 2016-12-21 LAB — URINE CULTURE

## 2016-12-21 MED ORDER — OSELTAMIVIR PHOSPHATE 75 MG PO CAPS
75.0000 mg | ORAL_CAPSULE | Freq: Two times a day (BID) | ORAL | Status: AC
  Administered 2016-12-21 – 2016-12-23 (×4): 75 mg via ORAL
  Filled 2016-12-21 (×4): qty 1

## 2016-12-21 MED ORDER — BENZONATATE 100 MG PO CAPS
200.0000 mg | ORAL_CAPSULE | Freq: Three times a day (TID) | ORAL | Status: DC
  Administered 2016-12-21 – 2016-12-23 (×8): 200 mg via ORAL
  Filled 2016-12-21 (×8): qty 2

## 2016-12-21 MED ORDER — METHYLPREDNISOLONE SODIUM SUCC 40 MG IJ SOLR
40.0000 mg | Freq: Four times a day (QID) | INTRAMUSCULAR | Status: DC
  Administered 2016-12-21 – 2016-12-23 (×6): 40 mg via INTRAVENOUS
  Filled 2016-12-21 (×7): qty 1

## 2016-12-21 NOTE — Progress Notes (Signed)
PROGRESS NOTE                                                                                                                                                                                                             Patient Demographics:    Maria Griffith, is a 56 y.o. female, DOB - July 22, 1961, [id number]  Admit date - 12/18/2016   Admitting Physician Edwin Dada, MD  Outpatient Primary MD for the patient is Andrena Mews, MD  LOS - 3  Outpatient Specialists: none  Chief Complaint  Patient presents with  . URI  . Cough      Brief Narrative   56 year old female with history of chronic pain, moderate persistent asthma, major depression/PTSD presented with cough with wheezing, myalgia and chills. She was positive for influenza A and admitted for acute hypoxic respiratory failure with associated asthma exacerbation. Very slow to improve   Subjective:   Still c/o severe cough and dyspnea  Assessment  & Plan :    Principal Problem: Acute hypoxic respiratory failure secondary to influenza A and acute asthma exacerbation -Continue Tamiflu. -slow to improve, less wheezing today -cut down solumedrol from 80mg  to 40mg  Q6 -duonebs -add cough meds-mucinex/tessalon perles -wean O2, repeat CXR today    AKI (acute kidney injury) ([place]) -improved with hydration -stop IVF, benicar on hold    Essential hypertension Stable. Continue amlodipine. Holding Benicar    Asthma, moderate persistent, with exacerbation -as above  Hypokalemia -repleted    MDD (major depressive disorder), recurrent episode, moderate (HCC)   Post traumatic stress disorder (PTSD) Continue home medications  DVt proph: lovenox  Code Status : Full code Family Communication  : None at bedside Disposition Plan  : Home once clinically improved, possibly in the next 48 hours  Consults  :  None  Procedures  : None   Lab Results    Component Value Date   PLT 223 12/20/2016    Antibiotics  :  Anti-infectives    Start     Dose/Rate Route Frequency Ordered Stop   12/21/16 2200  oseltamivir (TAMIFLU) capsule 75 mg     75 mg Oral 2 times daily 12/21/16 1202 12/23/16 2159   12/19/16 2200  oseltamivir (TAMIFLU) capsule 30 mg  Status:  Discontinued     30 mg Oral 2 times daily 12/19/16 1140 12/21/16  1202   12/18/16 2245  oseltamivir (TAMIFLU) capsule 75 mg  Status:  Discontinued     75 mg Oral 2 times daily 12/18/16 2244 12/19/16 1140        Objective:   Vitals:   12/21/16 0516 12/21/16 0807 12/21/16 0810 12/21/16 1040  BP: (!) 149/85   (!) 145/82  Pulse: 69     Resp: 16     Temp: 98.3 F (36.8 C)     TempSrc: Axillary     SpO2: 98% 97% 97%   Weight:      Height:        Wt Readings from Last 3 Encounters:  12/19/16 78.4 kg (172 lb 13.5 oz)  09/07/15 76.2 kg (168 lb)  09/01/15 76.2 kg (168 lb)     Intake/Output Summary (Last 24 hours) at 12/21/16 1351 Last data filed at 12/21/16 1338  Gross per 24 hour  Intake             2880 ml  Output             1250 ml  Net             1630 ml     Physical Exam  Gen: AAOx3, mild distress HEENT:  moist mucosa, supple neck Chest: scattered exp wheezes CVS: N S1&S2, no murmurs GI: soft, NT, ND, BS present Musculoskeletal: warm, no edema     Data Review:    CBC  Recent Labs Lab 12/18/16 1437 12/19/16 0116 12/20/16 0505  WBC 6.2 4.7 6.2  HGB 12.8 11.2* 10.7*  HCT 36.9 32.7* 32.2*  PLT 255 227 223  MCV 82.6 84.5 86.8  MCH 28.6 28.9 28.8  MCHC 34.7 34.3 33.2  RDW 13.9 14.4 15.0  LYMPHSABS 2.3  --   --   MONOABS 0.5  --   --   EOSABS 0.1  --   --   BASOSABS 0.0  --   --     Chemistries   Recent Labs Lab 12/18/16 1437 12/19/16 0116 12/20/16 0505 12/21/16 0510  NA 141 142 137 138  K 2.9* 3.6 4.2 4.4  CL 99* 105 106 109  CO2 29 26 23 24   GLUCOSE 98 172* 130* 133*  BUN 48* 39* 28* 22*  CREATININE 1.73* 1.48* 1.24* 0.77  CALCIUM  9.1 8.8* 8.5* 8.4*  MG  --  2.6*  --   --    ------------------------------------------------------------------------------------------------------------------ No results for input(s): CHOL, HDL, LDLCALC, TRIG, CHOLHDL, LDLDIRECT in the last 72 hours.  Lab Results  Component Value Date   HGBA1C 5.6 10/17/2013   ------------------------------------------------------------------------------------------------------------------ No results for input(s): TSH, T4TOTAL, T3FREE, THYROIDAB in the last 72 hours.  Invalid input(s): FREET3 ------------------------------------------------------------------------------------------------------------------ No results for input(s): VITAMINB12, FOLATE, FERRITIN, TIBC, IRON, RETICCTPCT in the last 72 hours.  Coagulation profile No results for input(s): INR, PROTIME in the last 168 hours.  No results for input(s): DDIMER in the last 72 hours.  Cardiac Enzymes No results for input(s): CKMB, TROPONINI, MYOGLOBIN in the last 168 hours.  Invalid input(s): CK ------------------------------------------------------------------------------------------------------------------ No results found for: BNP  Inpatient Medications  Scheduled Meds: . amLODipine  5 mg Oral Daily  . arformoterol  15 mcg Nebulization BID  . benzonatate  200 mg Oral TID  . budesonide (PULMICORT) nebulizer solution  1 mg Nebulization BID  . enoxaparin (LOVENOX) injection  40 mg Subcutaneous Q24H  . guaiFENesin  1,200 mg Oral BID  . ipratropium-albuterol  3 mL Nebulization Q6H  . loratadine  10  mg Oral Daily  . methylPREDNISolone (SOLU-MEDROL) injection  40 mg Intravenous Q6H  . oseltamivir  75 mg Oral BID  . oxybutynin  20 mg Oral QHS  . pantoprazole  40 mg Oral Q0600  . pregabalin  100 mg Oral TID  . senna-docusate  1 tablet Oral QHS  . tiZANidine  4 mg Oral TID  . traZODone  150 mg Oral QHS   Continuous Infusions:  PRN Meds:.acetaminophen **OR** acetaminophen, albuterol,  diclofenac sodium, gi cocktail, guaiFENesin-dextromethorphan, HYDROcodone-acetaminophen, HYDROcodone-homatropine, LORazepam, ondansetron **OR** ondansetron (ZOFRAN) IV  Micro Results Recent Results (from the past 240 hour(s))  Rapid strep screen     Status: None   Collection Time: 12/18/16  1:03 PM  Result Value Ref Range Status   Streptococcus, Group A Screen (Direct) NEGATIVE NEGATIVE Final    Comment: (NOTE) A Rapid Antigen test may result negative if the antigen level in the sample is below the detection level of this test. The FDA has not cleared this test as a stand-alone test therefore the rapid antigen negative result has reflexed to a Group A Strep culture.   Culture, group A strep     Status: None   Collection Time: 12/18/16  1:03 PM  Result Value Ref Range Status   Specimen Description THROAT  Final   Special Requests NONE Reflexed from [id number]  Final   Culture RARE GROUP A STREP (S.PYOGENES) ISOLATED  Final   Report Status 12/20/2016 FINAL  Final  Culture, Urine     Status: Abnormal   Collection Time: 12/19/16  1:27 PM  Result Value Ref Range Status   Specimen Description URINE, CLEAN CATCH  Final   Special Requests NONE  Final   Culture MULTIPLE SPECIES PRESENT, SUGGEST RECOLLECTION (A)  Final   Report Status 12/21/2016 FINAL  Final    Radiology Reports Dg Chest 2 View  Result Date: 12/21/2016 CLINICAL DATA:  Dyspnea and weakness. EXAM: CHEST  2 VIEW COMPARISON:  PA and lateral chest 12/18/2016 and 10/17/2013. FINDINGS: There is cardiomegaly without edema. Lungs are clear. No pneumothorax or pleural effusion. Aortic atherosclerosis is noted. The patient is status post cervical fusion. IMPRESSION: Cardiomegaly without acute disease. Atherosclerosis. Electronically Signed   By: Inge Rise M.D.   On: 12/21/2016 09:35   Dg Chest 2 View  Result Date: 12/18/2016 CLINICAL DATA:  56 year old female with productive cough shortness of breath congestion and body ache for 2  weeks. Initial encounter. EXAM: CHEST  2 VIEW COMPARISON:  10/17/2013. FINDINGS: Stable mild to moderate cardiomegaly. Other mediastinal contours are within normal limits. Visualized tracheal air column is within normal limits. Previous cervical ACDF again noted. No pneumothorax, pulmonary edema, pleural effusion or confluent pulmonary opacity. Questionable increased pulmonary interstitium diffusely. No acute osseous abnormality identified. Negative visible bowel gas pattern. IMPRESSION: 1. Suggestion of mild diffuse interstitial opacity raising the possibility of viral or atypical respiratory infection. No pleural effusion or other pulmonary abnormality. 2. Chronic mild to moderate cardiomegaly. Electronically Signed   By: Genevie Ann M.D.   On: 12/18/2016 14:05    Time Spent in minutes  [place] M.D on 12/21/2016 at 1:51 PM  Between 7am to 7pm - Pager - [phone]  After 7pm go to [email or web address] - password [facility]  [facility] -  Office  [phone]

## 2016-12-22 ENCOUNTER — Encounter: Payer: Worker's Compensation | Admitting: Registered Nurse

## 2016-12-22 DIAGNOSIS — B950 Streptococcus, group A, as the cause of diseases classified elsewhere: Secondary | ICD-10-CM

## 2016-12-22 LAB — MAGNESIUM: Magnesium: 2 mg/dL (ref 1.7–2.4)

## 2016-12-22 LAB — COMPREHENSIVE METABOLIC PANEL
ALK PHOS: 55 U/L (ref 38–126)
ALT: 94 U/L — ABNORMAL HIGH (ref 14–54)
AST: 60 U/L — ABNORMAL HIGH (ref 15–41)
Albumin: 3.9 g/dL (ref 3.5–5.0)
Anion gap: 9 (ref 5–15)
BUN: 23 mg/dL — ABNORMAL HIGH (ref 6–20)
CALCIUM: 8.9 mg/dL (ref 8.9–10.3)
CO2: 24 mmol/L (ref 22–32)
Chloride: 104 mmol/L (ref 101–111)
Creatinine, Ser: 1.01 mg/dL — ABNORMAL HIGH (ref 0.44–1.00)
GFR calc Af Amer: >60 mL/min (ref >60)
GFR calc non Af Amer: >60 mL/min (ref >60)
Glucose, Bld: 92 mg/dL (ref 65–99)
Potassium: 3.7 mmol/L (ref 3.5–5.1)
SODIUM: 137 mmol/L (ref 135–145)
Total Bilirubin: 0.6 mg/dL (ref 0.3–1.2)
Total Protein: 7.7 g/dL (ref 6.5–8.1)

## 2016-12-22 LAB — CBC WITH DIFFERENTIAL/PLATELET
BASOS ABS: 0 10*3/uL (ref 0.0–0.1)
BASOS PCT: 0 %
EOS ABS: 0 10*3/uL (ref 0.0–0.7)
Eosinophils Relative: 0 %
HCT: 35.3 % — ABNORMAL LOW (ref 36.0–46.0)
HEMOGLOBIN: 11.9 g/dL — AB (ref 12.0–15.0)
LYMPHS PCT: 27 %
Lymphs Abs: 2.4 10*3/uL (ref 0.7–4.0)
MCH: 28.7 pg (ref 26.0–34.0)
MCHC: 33.7 g/dL (ref 30.0–36.0)
MCV: 85.3 fL (ref 78.0–100.0)
MONOS PCT: 4 %
Monocytes Absolute: 0.4 10*3/uL (ref 0.1–1.0)
NEUTROS PCT: 69 %
Neutro Abs: 6.1 10*3/uL (ref 1.7–7.7)
Platelets: 296 10*3/uL (ref 150–400)
RBC: 4.14 MIL/uL (ref 3.87–5.11)
RDW: 14.5 % (ref 11.5–15.5)
WBC: 8.9 10*3/uL (ref 4.0–10.5)

## 2016-12-22 LAB — PHOSPHORUS: Phosphorus: 1.8 mg/dL — ABNORMAL LOW (ref 2.5–4.6)

## 2016-12-22 MED ORDER — HYDRALAZINE HCL 20 MG/ML IJ SOLN
20.0000 mg | Freq: Four times a day (QID) | INTRAMUSCULAR | Status: DC | PRN
  Administered 2016-12-22: 20 mg via INTRAVENOUS
  Filled 2016-12-22: qty 1

## 2016-12-22 MED ORDER — AMOXICILLIN 500 MG PO CAPS
500.0000 mg | ORAL_CAPSULE | Freq: Two times a day (BID) | ORAL | Status: DC
  Administered 2016-12-22 – 2016-12-23 (×3): 500 mg via ORAL
  Filled 2016-12-22 (×4): qty 1

## 2016-12-22 NOTE — Progress Notes (Signed)
On call MD notified of BP 177/96, pulse 58.

## 2016-12-22 NOTE — Progress Notes (Signed)
PROGRESS NOTE    Maria VERRETT  [id number] DOB: Jan 11, 1961 DOA: 12/18/2016 PCP: Andrena Mews, MD   Brief Narrative:  56 year old female with history of chronic pain, moderate persistent asthma, major depression/PTSD presented with cough with wheezing, myalgia and chills. She was positive for influenza A and admitted for acute hypoxic respiratory failure with associated asthma exacerbation. Very slow to improve  Assessment & Plan:   Principal Problem:   AKI (acute kidney injury) ([place]) Active Problems:   Essential hypertension   Asthma, moderate persistent   MDD (major depressive disorder), recurrent episode, moderate (HCC)   Post traumatic stress disorder (PTSD)   Hypokalemia   Influenza A   Exacerbation of asthma  Acute Hypoxic Respiratory Failure secondary to influenza A and Acute Asthma Exacerbation and likely Concomitant Group A Strep Pharyngitis  -Continue Oseltamivir 75 mg po BID x 5 day course. -Rapid Group A Strep Screen was Negative however Reflex Group A Strep Cx showed Rare Group A Strep (strep Pyogenes) Isolated; After Discussion with Infectious Diseases could possibly be colonization but worry about a secondary infection so Dr. Baxter Flattery recommended treating as patient is having a slow recovery. *attempted to look in throat and could not see very much of the oropharynx because of tongue. Visualized portion did not appear to have any exudates.  -Will start Amoxicillin 500 mg po q12h x 10 days based on Reccomendation from Dr. Baxter Flattery -C/w Droplet Precautions -C/w Benzonatate 200 mg TID, Brovan 15 mcg Nebs BID, Budesonide 1 mg Neb BID, Gauifenesi 1200 mg po BID, Robitussin DM 5 mL po q4hprn cough, Hycodan 5 mL po q4hprn Cough and DuoNeb q6h -C/w Loratidine 10 mog po Daily -C/w Methylprednisolone 40 mg IV q6h -Continue to wean O2,  AKI (Acute Kidney Injury)  -Improved with hydration; IVF had stopped  -Patient's BUN/Cr went from 22/0.77 -> 23/1.01 -Will reinitiate low  dose IVF at 75 mL/hr -Avoid Nephrotoxics and continue to Hold Benicar  Essential Hypertension -Continue Amlodipine 5 mg po Daily.  -Holding Olmesartan-HCTZ -C/w Hydralazine 20 mg IV q6hprn for SBP > 170 or DBP > 100  Asthma, Moderate Persistent, with exacerbation -Management As Above  Hypokalemia -Improved. Patient's K+ was 3.7 this AM -Replete as necessary -Repeat CMP in AM  MDD (major depressive disorder), recurrent episode, moderate (HCC) Post traumatic stress disorder (PTSD) / Chronic Pain Disorder / Mood Diorder -Continue Home Medications of Trazadone 150 mg po qHS, -C/w IV Lorazepam 0.5 mg po q6hprn  -C/w Pregabalin 100 mg po TID -C/w Norco 7.5-325 mg po q6hprn  and Senna-Docusate 1 tab po qHS -C/w Tizanidine 4 mg TID -Patient was supposed to be on Zoloft  Stress Incontinence -C/w Oxybutynin 20 mg po qHS -States she has been having more bladder leakage due to coughing  DVT prophylaxis: Lovenox 40 mg sq Code Status: FULL CODE Family Communication: No Family present at bedside Disposition Plan: Remain Inpatient today and possible D/C in AM  Consultants:   Discussed with Dr. Baxter Flattery of Infectious Diseases about Group A Strep   Procedures: None   Antimicrobials: Amoxicillin 500 mg po q12h started 12/22/16 -->  Tamiflu  Subjective: Seen and examined at bedside and still coughing. States she has mild urination when she coughs. No N/V but chest was sore and throat was a little sore. No Cp. Still wheezing.   Objective: Vitals:   12/22/16 0749 12/22/16 0758 12/22/16 0808 12/22/16 0831  BP:    (!) 144/85  Pulse:    96  Resp:  Temp:      TempSrc:      SpO2: 99% 99% 99%   Weight:      Height:        Intake/Output Summary (Last 24 hours) at 12/22/16 0834 Last data filed at 12/22/16 0356  Gross per 24 hour  Intake              930 ml  Output             1700 ml  Net             -770 ml   Filed Weights   12/19/16 0000  Weight: 78.4 kg (172 lb 13.5  oz)   Examination: Physical Exam:  Constitutional: WN/WD, NAD and appears calm and comfortable Eyes: Lids and conjunctivae normal, sclerae anicteric  ENMT: External Ears, Nose appear normal. Grossly normal hearing. Posterior pharynx clear of any exudate or lesions but was slightly erythematous.  Neck: Appears normal, supple, no cervical masses, normal ROM, no appreciable thyromegaly, no JVD Respiratory: Diminished to auscultation bilaterally with bilateral wheezing; No rales, rhonchi or crackles. Normal respiratory effort and patient is not tachypenic. No accessory muscle use. Not wearing supplemental O2.  Cardiovascular: RRR, no murmurs / rubs / gallops. S1 and S2 auscultated. No extremity edema.  Abdomen: Soft, non-tender, non-distended. No masses palpated. No appreciable hepatosplenomegaly. Bowel sounds positive x4.  GU: Deferred. Musculoskeletal: No clubbing / cyanosis of digits/nails. No joint deformity upper and lower extremities.  Skin: No rashes, lesions, ulcers on limited skin evaluation. No induration; Warm and dry.  Neurologic: CN 2-12 grossly intact with no focal deficits. Sensation intact in all 4 Extremities. Romberg sign cerebellar reflexes not assessed.  Psychiatric: Normal judgment and insight. Alert and oriented x 3. Normal mood and appropriate affect.   Data Reviewed: I have personally reviewed following labs and imaging studies  CBC:  Recent Labs Lab 12/18/16 1437 12/19/16 0116 12/20/16 0505  WBC 6.2 4.7 6.2  NEUTROABS 3.3  --   --   HGB 12.8 11.2* 10.7*  HCT 36.9 32.7* 32.2*  MCV 82.6 84.5 86.8  PLT 255 227 [id number]   Basic Metabolic Panel:  Recent Labs Lab 12/18/16 1437 12/19/16 0116 12/20/16 0505 12/21/16 0510  NA 141 142 137 138  K 2.9* 3.6 4.2 4.4  CL 99* 105 106 109  CO2 29 26 23 24   GLUCOSE 98 172* 130* 133*  BUN 48* 39* 28* 22*  CREATININE 1.73* 1.48* 1.24* 0.77  CALCIUM 9.1 8.8* 8.5* 8.4*  MG  --  2.6*  --   --    GFR: Estimated Creatinine  Clearance: 76.1 mL/min (by C-G formula based on SCr of 0.77 mg/dL). Liver Function Tests: No results for input(s): AST, ALT, ALKPHOS, BILITOT, PROT, ALBUMIN in the last 168 hours. No results for input(s): LIPASE, AMYLASE in the last 168 hours. No results for input(s): AMMONIA in the last 168 hours. Coagulation Profile: No results for input(s): INR, PROTIME in the last 168 hours. Cardiac Enzymes: No results for input(s): CKTOTAL, CKMB, CKMBINDEX, TROPONINI in the last 168 hours. BNP (last 3 results) No results for input(s): PROBNP in the last 8760 hours. HbA1C: No results for input(s): HGBA1C in the last 72 hours. CBG: No results for input(s): GLUCAP in the last 168 hours. Lipid Profile: No results for input(s): CHOL, HDL, LDLCALC, TRIG, CHOLHDL, LDLDIRECT in the last 72 hours. Thyroid Function Tests: No results for input(s): TSH, T4TOTAL, FREET4, T3FREE, THYROIDAB in the last 72 hours. Anemia Panel: No results  for input(s): VITAMINB12, FOLATE, FERRITIN, TIBC, IRON, RETICCTPCT in the last 72 hours. Sepsis Labs:  Recent Labs Lab 12/18/16 1449 12/18/16 1800  LATICACIDVEN 1.33 1.64    Recent Results (from the past 240 hour(s))  Rapid strep screen     Status: None   Collection Time: 12/18/16  1:03 PM  Result Value Ref Range Status   Streptococcus, Group A Screen (Direct) NEGATIVE NEGATIVE Final    Comment: (NOTE) A Rapid Antigen test may result negative if the antigen level in the sample is below the detection level of this test. The FDA has not cleared this test as a stand-alone test therefore the rapid antigen negative result has reflexed to a Group A Strep culture.   Culture, group A strep     Status: None   Collection Time: 12/18/16  1:03 PM  Result Value Ref Range Status   Specimen Description THROAT  Final   Special Requests NONE Reflexed from [id number]  Final   Culture RARE GROUP A STREP (S.PYOGENES) ISOLATED  Final   Report Status 12/20/2016 FINAL  Final  Culture,  Urine     Status: Abnormal   Collection Time: 12/19/16  1:27 PM  Result Value Ref Range Status   Specimen Description URINE, CLEAN CATCH  Final   Special Requests NONE  Final   Culture MULTIPLE SPECIES PRESENT, SUGGEST RECOLLECTION (A)  Final   Report Status 12/21/2016 FINAL  Final    Radiology Studies: Dg Chest 2 View  Result Date: 12/21/2016 CLINICAL DATA:  Dyspnea and weakness. EXAM: CHEST  2 VIEW COMPARISON:  PA and lateral chest 12/18/2016 and 10/17/2013. FINDINGS: There is cardiomegaly without edema. Lungs are clear. No pneumothorax or pleural effusion. Aortic atherosclerosis is noted. The patient is status post cervical fusion. IMPRESSION: Cardiomegaly without acute disease. Atherosclerosis. Electronically Signed   By: Inge Rise M.D.   On: 12/21/2016 09:35   Scheduled Meds: . amLODipine  5 mg Oral Daily  . arformoterol  15 mcg Nebulization BID  . benzonatate  200 mg Oral TID  . budesonide (PULMICORT) nebulizer solution  1 mg Nebulization BID  . enoxaparin (LOVENOX) injection  40 mg Subcutaneous Q24H  . guaiFENesin  1,200 mg Oral BID  . ipratropium-albuterol  3 mL Nebulization Q6H  . loratadine  10 mg Oral Daily  . methylPREDNISolone (SOLU-MEDROL) injection  40 mg Intravenous Q6H  . oseltamivir  75 mg Oral BID  . oxybutynin  20 mg Oral QHS  . pantoprazole  40 mg Oral Q0600  . pregabalin  100 mg Oral TID  . senna-docusate  1 tablet Oral QHS  . tiZANidine  4 mg Oral TID  . traZODone  150 mg Oral QHS   Continuous Infusions:   LOS: 4 days   Kerney Elbe, DO [facility] Pager [phone]  If 7PM-7AM, please contact night-coverage [email or web address] Password [facility] 12/22/2016, 8:34 AM

## 2016-12-22 NOTE — Progress Notes (Signed)
Case management consult to get patient a nebulizer, contacted [facility] to deliver to room.

## 2016-12-23 LAB — COMPREHENSIVE METABOLIC PANEL
ALT: 76 U/L — ABNORMAL HIGH (ref 14–54)
AST: 36 U/L (ref 15–41)
Albumin: 3.4 g/dL — ABNORMAL LOW (ref 3.5–5.0)
Alkaline Phosphatase: 46 U/L (ref 38–126)
Anion gap: 7 (ref 5–15)
BUN: 24 mg/dL — AB (ref 6–20)
CHLORIDE: 104 mmol/L (ref 101–111)
CO2: 24 mmol/L (ref 22–32)
CREATININE: 0.9 mg/dL (ref 0.44–1.00)
Calcium: 8.6 mg/dL — ABNORMAL LOW (ref 8.9–10.3)
GFR calc Af Amer: >60 mL/min (ref >60)
Glucose, Bld: 130 mg/dL — ABNORMAL HIGH (ref 65–99)
Potassium: 4.4 mmol/L (ref 3.5–5.1)
Sodium: 135 mmol/L (ref 135–145)
Total Bilirubin: 0.5 mg/dL (ref 0.3–1.2)
Total Protein: 6.6 g/dL (ref 6.5–8.1)

## 2016-12-23 LAB — CBC WITH DIFFERENTIAL/PLATELET
BASOS ABS: 0 10*3/uL (ref 0.0–0.1)
Basophils Relative: 0 %
EOS PCT: 0 %
Eosinophils Absolute: 0 10*3/uL (ref 0.0–0.7)
HEMATOCRIT: 32.5 % — AB (ref 36.0–46.0)
Hemoglobin: 11.2 g/dL — ABNORMAL LOW (ref 12.0–15.0)
LYMPHS PCT: 13 %
Lymphs Abs: 0.9 10*3/uL (ref 0.7–4.0)
MCH: 29.1 pg (ref 26.0–34.0)
MCHC: 34.5 g/dL (ref 30.0–36.0)
MCV: 84.4 fL (ref 78.0–100.0)
Monocytes Absolute: 0.1 10*3/uL (ref 0.1–1.0)
Monocytes Relative: 2 %
NEUTROS ABS: 5.8 10*3/uL (ref 1.7–7.7)
NEUTROS PCT: 85 %
PLATELETS: 282 10*3/uL (ref 150–400)
RBC: 3.85 MIL/uL — AB (ref 3.87–5.11)
RDW: 14.4 % (ref 11.5–15.5)
WBC: 6.8 10*3/uL (ref 4.0–10.5)

## 2016-12-23 LAB — MAGNESIUM: MAGNESIUM: 2.1 mg/dL (ref 1.7–2.4)

## 2016-12-23 LAB — PHOSPHORUS: Phosphorus: 2.7 mg/dL (ref 2.5–4.6)

## 2016-12-23 MED ORDER — IPRATROPIUM-ALBUTEROL 0.5-2.5 (3) MG/3ML IN SOLN: 3.0000 mL | Freq: Two times a day (BID) | RESPIRATORY_TRACT | Status: DC

## 2016-12-23 MED ORDER — GUAIFENESIN ER 600 MG PO TB12: 1200.0000 mg | ORAL_TABLET | Freq: Two times a day (BID) | ORAL | 0 refills | Status: DC

## 2016-12-23 MED ORDER — PREDNISONE 10 MG (21) PO TBPK: 10.0000 mg | ORAL_TABLET | Freq: Every day | ORAL | 0 refills | Status: DC

## 2016-12-23 MED ORDER — AMOXICILLIN 500 MG PO CAPS: 500.0000 mg | ORAL_CAPSULE | Freq: Two times a day (BID) | ORAL | 0 refills | Status: DC

## 2016-12-23 MED ORDER — FLUTICASONE-SALMETEROL 250-50 MCG/DOSE IN AEPB: 1.0000 | INHALATION_SPRAY | Freq: Two times a day (BID) | RESPIRATORY_TRACT | 0 refills | Status: DC

## 2016-12-23 MED ORDER — GUAIFENESIN-DM 100-10 MG/5ML PO SYRP: 5.0000 mL | ORAL_SOLUTION | ORAL | 0 refills | Status: DC | PRN

## 2016-12-23 MED ORDER — ALBUTEROL SULFATE (2.5 MG/3ML) 0.083% IN NEBU: 2.5000 mg | INHALATION_SOLUTION | Freq: Four times a day (QID) | RESPIRATORY_TRACT | 0 refills | Status: DC | PRN

## 2016-12-23 MED ORDER — BENZONATATE 200 MG PO CAPS: 200.0000 mg | ORAL_CAPSULE | Freq: Three times a day (TID) | ORAL | 0 refills | Status: DC

## 2016-12-23 MED ORDER — PANTOPRAZOLE SODIUM 40 MG PO TBEC: 40.0000 mg | DELAYED_RELEASE_TABLET | Freq: Every day | ORAL | 0 refills | Status: DC

## 2016-12-23 MED ORDER — LORATADINE 10 MG PO TABS: 10.0000 mg | ORAL_TABLET | Freq: Every day | ORAL | 0 refills | Status: DC

## 2016-12-23 MED ORDER — METHYLPREDNISOLONE SODIUM SUCC 40 MG IJ SOLR: 40.0000 mg | Freq: Two times a day (BID) | INTRAMUSCULAR | Status: DC

## 2016-12-23 NOTE — Care Management Note (Signed)
Case Management Note  Patient Details  Name: Maria Griffith MRN: [id number] Date of Birth: [birth year]  Subjective/Objective:    AKI, asthma exac, Influenza A           Action/Plan: Discharge Planning: AVS reviewed:  [facility] delivered neb machine to room ( please see previous NCM notes). Unit RN will demonstrate to pt on how to use device.   PCP  Andrena Mews T MD  Expected Discharge Date:  12/21/16               Expected Discharge Plan:  Home/Self Care  In-House Referral:  NA  Discharge planning Services  CM Consult  Post Acute Care Choice:  Durable Medical Equipment Choice offered to:  NA  DME Arranged:  Nebulizer machine DME Agency:  [place]:  NA [place] Agency:  NA  Status of Service:  Completed, signed off  If discussed at [place] of Stay Meetings, dates discussed:    Additional Comments:  Erenest Rasher, RN 12/23/2016, 5:25 PM

## 2016-12-23 NOTE — Progress Notes (Signed)
Pt instructed on nebulizer use. Opened nebulizer machine with pt as delivered from [facility] for home use- reviered manual and pt demonstrated for pt how to load medication, apply tubing and how to administer with verbalized understanding. Pt reassured. Preparing to dc home.

## 2016-12-23 NOTE — Discharge Summary (Signed)
Physician Discharge Summary  Maria Griffith [id number] DOB: [birth year] DOA: 12/18/2016  PCP: Andrena Mews, MD  Admit date: 12/18/2016 Discharge date: 12/23/2016  Admitted From: Home Disposition:  Home  Recommendations for Outpatient Follow-up:  1. Follow up with PCP in 1-2 weeks at the [facility] 2. Please obtain BMP/CBC in one week  Home Health: No Equipment/Devices: Nebulizer  Discharge Condition: Stable CODE STATUS: FULL Diet recommendation: Heart Healthy / Carb Modified    Brief/Interim Summary: 56 year old female with history of chronic pain, moderate persistent asthma, major depression/PTSD and other comorbids presented with cough with wheezing, myalgia and chills. She was positive for influenza A and admitted for acute hypoxic respiratory failure with associated asthma exacerbation. Improvement was slow and she was found to also have Group A Strep and was started on Amoxicillin. Patient steadily improved respiratory wise and at this time is stable to be D/C'd Home and follow up with her PCP at the [facility].   Discharge Diagnoses:  Principal Problem:   AKI (acute kidney injury) ([place]) Active Problems:   Essential hypertension   Asthma, moderate persistent   MDD (major depressive disorder), recurrent episode, moderate (HCC)   Post traumatic stress disorder (PTSD)   Hypokalemia   Influenza A   Exacerbation of asthma  Acute Hypoxic Respiratory Failure secondary to influenza A and Acute Asthma Exacerbation and likely Concomitant Group A Strep Pharyngitis, improved -Completed Oseltamivir 75 mg po BID x 5 day course. -Rapid Group A Strep Screen was Negative however Reflex Group A Strep Cx showed Rare Group A Strep (strep Pyogenes) Isolated; After Discussion with Infectious Diseases could possibly be colonization but worry about a secondary infection so Dr. Baxter Flattery recommended treating as patient is having a slow recovery. *attempted to look in throat and  could not see very much of the oropharynx because of tongue. Visualized portion did not appear to have any exudates.  -Started Amoxicillin 500 mg po q12h x 10 days based on Reccomendation from Dr. 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  -Restart Olmesartan-HCTZ -[facility]  Asthma, Moderate Persistent, with exacerbation -Management As Above -Improved -Continue Albuterol, Advair, and Nebulizer with Albuterol  Hypokalemia -Improved. Patient's K+ was 4.4 this AM -Repeat CMP as an outpatient  MDD (major depressive disorder), recurrent episode, moderate (HCC) Post traumatic stress disorder (PTSD) / Chronic Pain Disorder / Mood Diorder -Continue Home Medications of Trazadone 150 mg po qHS, -C/w IV Lorazepam 0.5 mg po q6hprn  -C/w Pregabalin 100 mg po TID -C/w Norco 7.5-325 mg po q6hprn  and Senna-Docusate 1 tab po qHS -C/w Tizanidine 4 mg TID -Patient was supposed to be on Zoloft but will defer to PCP to re-initiate  Stress Incontinence -C/w Oxybutynin 20 mg po qHS -States she has been having more bladder leakage due to coughing  Discharge Instructions  Discharge Instructions    Call MD for:  difficulty breathing, headache or visual disturbances     Complete by:  As directed    Call MD for:  persistant dizziness or light-headedness    Complete by:  As directed    Call MD for:  persistant nausea and vomiting    Complete by:  As directed    Call MD for:  severe uncontrolled pain    Complete by:  As directed    Call MD for:  temperature >100.4    Complete by:  As directed    Diet - low sodium heart healthy    Complete by:  As directed    Discharge instructions    Complete by:  As directed    Follow up with [facility]. Take all medications as prescribed. If symptoms change or worsen please return to the ED or PCP for Evaluation.   Increase activity slowly    Complete by:  As directed      Allergies as of 12/23/2016      Reactions   Compazine [prochlorperazine Edisylate] Anaphylaxis   Iohexol Anaphylaxis   "tingling in hands & abnormal behavior"   Phenergan [promethazine Hcl] Anaphylaxis   Reglan [metoclopramide] Anaphylaxis   Ondansetron Hcl Nausea Only   headaches   Zofran [ondansetron] Other (See Comments)   headaches   Aripiprazole Hives      Medication List    STOP taking these medications   fluticasone 220 MCG/ACT inhaler Commonly known as:  FLOVENT HFA     TAKE these medications   amLODipine 5 MG tablet Commonly known as:  NORVASC TAKE 1 TABLET BY MOUTH EVERY DAY What changed:  See the new instructions.   amoxicillin 500 MG capsule Commonly known as:  AMOXIL Take 1 capsule (500 mg total) by mouth every 12 (twelve) hours.   benzonatate 200 MG capsule Commonly known as:  TESSALON Take 1 capsule (200 mg total) by mouth 3 (three) times daily.   diclofenac sodium 1 % Gel Commonly known as:  VOLTAREN Apply 2 g topically 4 times daily. What changed:  how much to take  how to take this  when to take this  reasons to take this  additional instructions   Fluticasone-Salmeterol 250-50 MCG/DOSE Aepb Commonly known as:  ADVAIR DISKUS Inhale 1 puff into the lungs 2 (two) times daily.    guaiFENesin 600 MG 12 hr tablet Commonly known as:  MUCINEX Take 2 tablets (1,200 mg total) by mouth 2 (two) times daily.   guaiFENesin-dextromethorphan 100-10 MG/5ML syrup Commonly known as:  ROBITUSSIN DM Take 5 mLs by mouth every 4 (four) hours as needed for cough.   HYDROcodone-acetaminophen 7.5-325 MG tablet Commonly known as:  NORCO Take 1 tablet by mouth every 6 (six) hours as needed for moderate pain.   loratadine 10 MG tablet Commonly known as:  CLARITIN Take 1 tablet (10 mg total) by mouth daily. Start taking on:  12/24/2016   olmesartan-hydrochlorothiazide 20-12.5 MG tablet Commonly known as:  BENICAR HCT TAKE 1 TABLET BY MOUTH EVERY DAY   oxybutynin 10 MG 24 hr tablet Commonly known as:  DITROPAN-XL Take 2 tablets (20 mg total) by mouth at bedtime.   pantoprazole 40 MG tablet Commonly known as:  PROTONIX Take 1 tablet (40 mg total) by mouth daily at 6 (six) AM. Start taking on:  12/24/2016   polyethylene glycol powder powder Commonly known as:  GLYCOLAX/MIRALAX TAKE 17 GRAMS AS DIRECTED BY MOUTH DAILY UNTIL  PRODUCING NORMAL DAILY BOWEL MOVEMENTS. What changed:  See the new instructions.   predniSONE 10 MG (21) Tbpk tablet Commonly known as:  STERAPRED UNI-PAK 21 TAB Take 1 tablet (10 mg total) by mouth daily.   pregabalin 100 MG capsule Commonly known as:  LYRICA Take 1 capsule (100 mg total) by mouth 3 (three) times daily.   senna-docusate 8.6-50 MG tablet Commonly known as:  GNP STOOL SOFTENER/LAXATIVE Take 1 tablet by mouth at bedtime.   tiZANidine 4 MG tablet Commonly known as:  ZANAFLEX Take 1 tablet (4 mg total) by mouth 3 (three) times daily.   traZODone 150 MG tablet Commonly known as:  DESYREL Take 1 tablet (150 mg total) by mouth at bedtime.   VENTOLIN HFA 108 (90 Base) MCG/ACT inhaler Generic drug:  albuterol INHALE 2 PUFFS into THE lungs EVERY 6 HOURS AS NEEDED FOR WHEEZING What changed:  Another medication with the same name was added.  Make sure you understand how and when to take each.   albuterol (2.5 MG/3ML) 0.083% nebulizer solution Commonly known as:  PROVENTIL Take 3 mLs (2.5 mg total) by nebulization every 6 (six) hours as needed for wheezing or shortness of breath. What changed:  You were already taking a medication with the same name, and this prescription was added. Make sure you understand how and when to take each.            Durable Medical Equipment        Start     Ordered   12/22/16 1356  For home use only DME Nebulizer machine  Once    Question:  Patient needs a nebulizer to treat with the following condition  Answer:  Asthma   12/22/16 1355   12/22/16 1356  For home use only DME Nebulizer/meds  Once    Question:  Patient needs a nebulizer to treat with the following condition  Answer:  Asthma   12/22/16 1355     [facility] Follow up.   Why:  nebulizer Contact information: [place] [address] [phone]          Allergies  Allergen Reactions  . Compazine [Prochlorperazine Edisylate] Anaphylaxis  . Iohexol Anaphylaxis    "tingling in hands & abnormal behavior"  . Phenergan [Promethazine Hcl] Anaphylaxis  . Reglan [Metoclopramide] Anaphylaxis  . Ondansetron Hcl Nausea Only    headaches  . Zofran [Ondansetron] Other (See Comments)    headaches   . Aripiprazole Hives    Consultations:  Discussed with Dr. Baxter Flattery of Infectious Diseases about Group A Strep   Procedures/Studies: Dg Chest 2 View  Result Date: 12/21/2016 CLINICAL DATA:  Dyspnea and weakness. EXAM: CHEST  2 VIEW COMPARISON:  PA and lateral chest 12/18/2016 and 10/17/2013. FINDINGS: There is cardiomegaly without edema. Lungs are clear. No pneumothorax or pleural effusion. Aortic atherosclerosis is noted. The patient is status post cervical fusion. IMPRESSION: Cardiomegaly without acute disease. Atherosclerosis. Electronically Signed   By: Inge Rise M.D.   On: 12/21/2016 09:35   Dg Chest 2 View  Result Date: 12/18/2016 CLINICAL DATA:  56 year old female with productive cough shortness of breath congestion and body ache for 2 weeks. Initial encounter. EXAM: CHEST  2 VIEW COMPARISON:  10/17/2013. FINDINGS: Stable mild to moderate cardiomegaly. Other mediastinal contours are within normal limits. Visualized tracheal air column is within normal limits. Previous cervical ACDF again noted. No pneumothorax, pulmonary edema, pleural effusion or confluent pulmonary  opacity. Questionable increased pulmonary interstitium diffusely. No acute osseous abnormality identified. Negative visible bowel gas pattern. IMPRESSION: 1. Suggestion of mild diffuse interstitial opacity raising the possibility of viral or atypical respiratory infection. No pleural effusion or other pulmonary abnormality. 2. Chronic mild to moderate cardiomegaly. Electronically Signed   By: Genevie Ann M.D.   On: 12/18/2016 14:05   Subjective: Seen and examined at bedside and was doing much better and no longer requiring O2. No wheezing. Felt Stronger and better. Ready to go home.  Discharge Exam: Vitals:   12/23/16 0553 12/23/16 1000  BP: (!) 156/94 (!) 142/90  Pulse: 63 82  Resp: 18 18  Temp: 98 F (36.7 C) 98.5 F (36.9 C)   Vitals:   12/23/16 0251 12/23/16 0553 12/23/16 0816 12/23/16 1000  BP:  (!) 156/94  (!) 142/90  Pulse:  63  82  Resp:  18  18  Temp:  98 F (36.7 C)  98.5 F (36.9 C)  TempSrc:  Axillary  Axillary  SpO2: 96% 98% 97% 99%  Weight:      Height:       General: Pt is alert, awake, not in acute distress Cardiovascular: RRR, S1/S2 +, no rubs, no gallops Respiratory: Diminished bilaterally, no wheezing, no rhonchi; Not tachypenic or using accessory muscles to breath. Has wet sounding cough Abdominal: Soft, NT, ND, bowel sounds + Extremities: no edema, no cyanosis  The results of significant diagnostics from this hospitalization (including imaging,  microbiology, ancillary and laboratory) are listed below for reference.    Microbiology: Recent Results (from the past 240 hour(s))  Rapid strep screen     Status: None   Collection Time: 12/18/16  1:03 PM  Result Value Ref Range Status   Streptococcus, Group A Screen (Direct) NEGATIVE NEGATIVE Final    Comment: (NOTE) A Rapid Antigen test may result negative if the antigen level in the sample is below the detection level of this test. The FDA has not cleared this test as a stand-alone test therefore the rapid antigen negative result has reflexed to a Group A Strep culture.   Culture, group A strep     Status: None   Collection Time: 12/18/16  1:03 PM  Result Value Ref Range Status   Specimen Description THROAT  Final   Special Requests NONE Reflexed from [id number]  Final   Culture RARE GROUP A STREP (S.PYOGENES) ISOLATED  Final   Report Status 12/20/2016 FINAL  Final  Culture, Urine     Status: Abnormal   Collection Time: 12/19/16  1:27 PM  Result Value Ref Range Status   Specimen Description URINE, CLEAN CATCH  Final   Special Requests NONE  Final   Culture MULTIPLE SPECIES PRESENT, SUGGEST RECOLLECTION (A)  Final   Report Status 12/21/2016 FINAL  Final    Labs: BNP (last 3 results) No results for input(s): BNP in the last 8760 hours. Basic Metabolic Panel:  Recent Labs Lab 12/19/16 0116 12/20/16 0505 12/21/16 0510 12/22/16 0850 12/23/16 0530  NA 142 137 138 137 135  K 3.6 4.2 4.4 3.7 4.4  CL 105 106 109 104 104  CO2 26 23 24 24 24   GLUCOSE 172* 130* 133* 92 130*  BUN 39* 28* 22* 23* 24*  CREATININE 1.48* 1.24* 0.77 1.01* 0.90  CALCIUM 8.8* 8.5* 8.4* 8.9 8.6*  MG 2.6*  --   --  2.0 2.1  PHOS  --   --   --  1.8* 2.7   Liver Function Tests:  Recent Labs Lab 12/22/16 0850 12/23/16 0530  AST 60* 36  ALT 94* 76*  ALKPHOS 55 46  BILITOT 0.6 0.5  PROT 7.7 6.6  ALBUMIN 3.9 3.4*   No results for input(s): LIPASE, AMYLASE in the last 168 hours. No results for  input(s): AMMONIA in the last 168 hours. CBC:  Recent Labs Lab 12/18/16 1437 12/19/16 0116 12/20/16 0505 12/22/16 0850 12/23/16 0530  WBC 6.2 4.7 6.2 8.9 6.8  NEUTROABS 3.3  --   --  6.1 5.8  HGB 12.8 11.2* 10.7* 11.9* 11.2*  HCT 36.9 32.7* 32.2* 35.3* 32.5*  MCV 82.6 84.5 86.8 85.3 84.4  PLT 255 227 223 296 282   Cardiac Enzymes: No results for input(s): CKTOTAL, CKMB, CKMBINDEX, TROPONINI in the last 168 hours. BNP: Invalid input(s): POCBNP CBG: No results for input(s): GLUCAP in the last 168 hours. D-Dimer No results for input(s): DDIMER in the last 72 hours. Hgb A1c No results for input(s): HGBA1C in the last 72 hours. Lipid Profile No results for input(s): CHOL, HDL, LDLCALC, TRIG, CHOLHDL, LDLDIRECT in the last 72 hours. Thyroid function studies No results for input(s): TSH, T4TOTAL, T3FREE, THYROIDAB in the last 72 hours.  Invalid input(s): FREET3 Anemia work up No results for input(s): VITAMINB12, FOLATE, FERRITIN, TIBC, IRON, RETICCTPCT in the last 72 hours. Urinalysis    Component Value Date/Time   COLORURINE YELLOW 12/19/2016 1327   APPEARANCEUR CLEAR 12/19/2016 1327   LABSPEC 1.018 12/19/2016 1327   PHURINE 5.0 12/19/2016 1327   GLUCOSEU NEGATIVE 12/19/2016 1327   HGBUR NEGATIVE 12/19/2016 1327   BILIRUBINUR NEGATIVE 12/19/2016 1327   [place] 07/18/2013 1136   KETONESUR NEGATIVE 12/19/2016 1327   PROTEINUR NEGATIVE 12/19/2016 1327   UROBILINOGEN 0.2 10/10/2013 0952   NITRITE NEGATIVE 12/19/2016 1327   LEUKOCYTESUR NEGATIVE 12/19/2016 1327   Sepsis Labs Invalid input(s): PROCALCITONIN,  WBC,  LACTICIDVEN Microbiology Recent Results (from the past 240 hour(s))  Rapid strep screen     Status: None   Collection Time: 12/18/16  1:03 PM  Result Value Ref Range Status   Streptococcus, Group A Screen (Direct) NEGATIVE NEGATIVE Final    Comment: (NOTE) A Rapid Antigen test may result negative if the antigen level in the sample is below the  detection level of this test. The FDA has not cleared this test as a stand-alone test therefore the rapid antigen negative result has reflexed to a Group A Strep culture.   Culture, group A strep     Status: None   Collection Time: 12/18/16  1:03 PM  Result Value Ref Range Status   Specimen Description THROAT  Final   Special Requests NONE Reflexed from [id number]  Final   Culture RARE GROUP A STREP (S.PYOGENES) ISOLATED  Final   Report Status 12/20/2016 FINAL  Final  Culture, Urine     Status: Abnormal   Collection Time: 12/19/16  1:27 PM  Result Value Ref Range Status   Specimen Description URINE, CLEAN CATCH  Final   Special Requests NONE  Final   Culture MULTIPLE SPECIES PRESENT, SUGGEST RECOLLECTION (A)  Final   Report Status 12/21/2016 FINAL  Final   Time coordinating discharge: Over 30 minutes  SIGNED:  Kerney Elbe, DO [facility] 12/23/2016, 3:51 PM Pager [phone]  If 7PM-7AM, please contact night-coverage [email or web address] Password [email or web address]

## 2016-12-23 NOTE — Progress Notes (Signed)
Assessment unchanged. Pt tolerated 6 minute walk earlier this shift, as ordered, keeping sats in mid to upper 90's on RA. No distress. Lungs remain clear. Md recently completed orders for dc home and called pt's scripts into local pharmacy. Instructed pt how to use home nebulizer machine with verbalized understanding through teach back. Pt verbalized understanding of dc instructions including when to call the doctor and follow up care at [facility]. Discharged via wc to front entrance to meet son and awaiting vehicle to carry pt home. Accompanied by RN and NT.

## 2016-12-23 NOTE — Care Management Important Message (Signed)
Important Message  Patient Details  Name: Maria Griffith MRN: [id number] Date of Birth: [birth year]   Medicare Important Message Given:  Yes    Erenest Rasher, RN 12/23/2016, 5:22 PM

## 2016-12-25 ENCOUNTER — Ambulatory Visit: Payer: Self-pay | Admitting: Registered Nurse

## 2016-12-28 ENCOUNTER — Ambulatory Visit: Payer: Self-pay | Admitting: Physical Medicine & Rehabilitation

## 2017-01-02 ENCOUNTER — Encounter: Payer: Self-pay | Admitting: Family Medicine

## 2017-01-02 ENCOUNTER — Encounter: Payer: Self-pay | Admitting: Physical Medicine & Rehabilitation

## 2017-01-02 ENCOUNTER — Ambulatory Visit (INDEPENDENT_AMBULATORY_CARE_PROVIDER_SITE_OTHER): Payer: Medicare Other | Admitting: Family Medicine

## 2017-01-02 ENCOUNTER — Encounter: Payer: Worker's Compensation | Attending: Physical Medicine & Rehabilitation

## 2017-01-02 ENCOUNTER — Telehealth: Payer: Self-pay | Admitting: Physical Medicine & Rehabilitation

## 2017-01-02 ENCOUNTER — Ambulatory Visit (HOSPITAL_BASED_OUTPATIENT_CLINIC_OR_DEPARTMENT_OTHER): Payer: Worker's Compensation | Admitting: Physical Medicine & Rehabilitation

## 2017-01-02 VITALS — BP 124/87 | HR 77 | Resp 14

## 2017-01-02 VITALS — BP 126/80 | HR 75 | Temp 98.2°F | Ht 61.0 in | Wt 186.0 lb

## 2017-01-02 DIAGNOSIS — Z1159 Encounter for screening for other viral diseases: Secondary | ICD-10-CM

## 2017-01-02 DIAGNOSIS — I351 Nonrheumatic aortic (valve) insufficiency: Secondary | ICD-10-CM

## 2017-01-02 DIAGNOSIS — D649 Anemia, unspecified: Secondary | ICD-10-CM

## 2017-01-02 DIAGNOSIS — E663 Overweight: Secondary | ICD-10-CM

## 2017-01-02 DIAGNOSIS — F331 Major depressive disorder, recurrent, moderate: Secondary | ICD-10-CM

## 2017-01-02 DIAGNOSIS — G243 Spasmodic torticollis: Secondary | ICD-10-CM

## 2017-01-02 DIAGNOSIS — I10 Essential (primary) hypertension: Secondary | ICD-10-CM

## 2017-01-02 DIAGNOSIS — Z09 Encounter for follow-up examination after completed treatment for conditions other than malignant neoplasm: Secondary | ICD-10-CM | POA: Insufficient documentation

## 2017-01-02 DIAGNOSIS — R0600 Dyspnea, unspecified: Secondary | ICD-10-CM | POA: Diagnosis not present

## 2017-01-02 DIAGNOSIS — R0602 Shortness of breath: Secondary | ICD-10-CM | POA: Diagnosis not present

## 2017-01-02 LAB — COMPLETE METABOLIC PANEL WITH GFR
ALT: 29 U/L (ref 6–29)
AST: 16 U/L (ref 10–35)
Albumin: 3.5 g/dL — ABNORMAL LOW (ref 3.6–5.1)
Alkaline Phosphatase: 46 U/L (ref 33–130)
BUN: 19 mg/dL (ref 7–25)
CHLORIDE: 102 mmol/L (ref 98–110)
CO2: 32 mmol/L — ABNORMAL HIGH (ref 20–31)
Calcium: 8.6 mg/dL (ref 8.6–10.4)
Creat: 1.08 mg/dL — ABNORMAL HIGH (ref 0.50–1.05)
GFR, Est African American: 67 mL/min (ref >=60)
GFR, Est Non African American: 58 mL/min — ABNORMAL LOW (ref >=60)
Glucose, Bld: 86 mg/dL (ref 65–99)
POTASSIUM: 4.1 mmol/L (ref 3.5–5.3)
SODIUM: 139 mmol/L (ref 135–146)
Total Bilirubin: 0.5 mg/dL (ref 0.2–1.2)
Total Protein: 6 g/dL — ABNORMAL LOW (ref 6.1–8.1)

## 2017-01-02 LAB — CBC WITH DIFFERENTIAL/PLATELET
BASOS ABS: 37 {cells}/uL (ref 0–200)
BASOS PCT: 1 %
EOS PCT: 3 %
Eosinophils Absolute: 111 cells/uL (ref 15–500)
HCT: 33.5 % — ABNORMAL LOW (ref 35.0–45.0)
HEMOGLOBIN: 11.1 g/dL — AB (ref 11.7–15.5)
LYMPHS ABS: 1850 {cells}/uL (ref 850–3900)
Lymphocytes Relative: 50 %
MCH: 29 pg (ref 27.0–33.0)
MCHC: 33.1 g/dL (ref 32.0–36.0)
MCV: 87.5 fL (ref 80.0–100.0)
MPV: 8.8 fL (ref 7.5–12.5)
Monocytes Absolute: 296 cells/uL (ref 200–950)
Monocytes Relative: 8 %
NEUTROS ABS: 1406 {cells}/uL — AB (ref 1500–7800)
Neutrophils Relative %: 38 %
Platelets: 310 10*3/uL (ref 140–400)
RBC: 3.83 MIL/uL (ref 3.80–5.10)
RDW: 15.7 % — ABNORMAL HIGH (ref 11.0–15.0)
WBC: 3.7 10*3/uL — AB (ref 3.8–10.8)

## 2017-01-02 MED ORDER — HYDROCODONE-ACETAMINOPHEN 7.5-325 MG PO TABS: 1.0000 | ORAL_TABLET | Freq: Four times a day (QID) | ORAL | 0 refills | Status: DC | PRN

## 2017-01-02 MED ORDER — PREGABALIN 100 MG PO CAPS
100.0000 mg | ORAL_CAPSULE | Freq: Three times a day (TID) | ORAL | 3 refills | Status: DC
Start: 2017-01-02 — End: 2017-04-30

## 2017-01-02 MED ORDER — TRAZODONE HCL 150 MG PO TABS: 150.0000 mg | ORAL_TABLET | Freq: Every day | ORAL | 2 refills | Status: DC

## 2017-01-02 MED ORDER — TIZANIDINE HCL 4 MG PO TABS: 4.0000 mg | ORAL_TABLET | Freq: Three times a day (TID) | ORAL | 2 refills | Status: DC

## 2017-01-02 NOTE — Assessment & Plan Note (Signed)
Patient worried about weight gain. She tired diet modification with no improvement. I advised her that our nutritionist might be able to help. I gave her Dr. Jenne Campus phone number for her to schedule an appointment. I also placed referral order on EPIC.

## 2017-01-02 NOTE — Assessment & Plan Note (Signed)
Discharge summary reviewed. She is doing well in general. Lab reviewed. Lab repeated per D/C summary. I will contact her with result.

## 2017-01-02 NOTE — Telephone Encounter (Signed)
Recd letter from [employer] -needing disability date - advised we were not treating ptn at time of injury or surgery and cannot confirm disability dates. Per [place] notes

## 2017-01-02 NOTE — Progress Notes (Signed)
   Procedure: Dysport injection Diagnosis: Cervical dystonia G 24.3 Dilution: 500 units in 1 mL sterile preservative-free normal saline  Informed consent was obtained after describing risks and benefits of the procedure with patient including bleeding bruising infection as well as the potential side effects of the medication itself. Patient elects to proceed and has given written consent.  Patient placed in a seated position Areas were marked and prepped with Betadine  Needle: 27-gauge 1 inch needle electrode connected to EMG amplifier  Right trapezius: 50 units Left trapezius: 100 units Left longissimus: 100 units Left splenius capitis: 150 units Left levator scapula: 100 units  All injections done after negative drawback for blood. Appropriate EMG activity.  

## 2017-01-02 NOTE — Assessment & Plan Note (Signed)
BP looks good. Continue current regimen. 

## 2017-01-02 NOTE — Assessment & Plan Note (Addendum)
R/O CHF given associated pedal swelling. She also has hx of Aortic insufficiency in previous ECHO. As discussed with her, we will plan of rechecking her ECHO. I will give follow up recommendation based on ECHO report. Return precaution discussed. Order placed.

## 2017-01-02 NOTE — Assessment & Plan Note (Signed)
PHQ2 score of 0. She denies any depressive symptoms. She is compliant with Trazodone, however, she stated she uses it for sleep and not for depression. F/U as needed.

## 2017-01-02 NOTE — Patient Instructions (Addendum)
Hepatitis C Hepatitis C is a liver infection. It is caused by a germ that can spread through blood and other bodily fluids. Your doctor will use blood and liver tests to:  Check for this infection.  Decide how to treat you.  Check your health after treatment. Follow these instructions at home:  Rest.  Do not take any medicine unless your doctor says it is okay. This includes over-the-counter medicine and birth control pills.  Do not drink alcohol.  Do not have sex until your doctor says it is okay.  Do not share toothbrushes, nail clippers, razors, or needles.  Take all medicines as told by your doctor. Contact a doctor if:  You have a fever.  Your belly (abdomen) hurts.  Your pee (urine) is dark.  Your poop (bowel movement) is the color of clay.  You have joint pain. Get help right away if:  You feel more and more tired (fatigued).  You feel more and more weak.  You do not feel like eating.  You feel sick to your stomach (nauseous) or throw up (vomit).  Your skin or the whites of your eyes turn yellow (jaundice) or turn more yellow than they were before.  You bruise or bleed easily. This information is not intended to replace advice given to you by your health care provider. Make sure you discuss any questions you have with your health care provider. Document Released: 11/16/2008 Document Revised: 05/11/2016 Document Reviewed: 03/18/2014 Elsevier Interactive Patient Education  2017 [employer]. Human Papillomavirus Human papillomavirus (HPV) is the most common sexually transmitted infection (STI). It is easy to pass it from person to person (contagious). HPV can cause cervical cancer, anal cancer, and genital warts. The genital warts can be seen and felt. Also, there may be wartlike regions in the throat. HPV may not have any symptoms. It is possible to have HPV for a long time and not know it. You may pass HPV on to others without knowing it. Follow these  instructions at home:  Take medicines as told by your doctor.  Use over-the-counter creams for itching as told by your doctor.  Keep all follow-up visits. Make sure to get Pap tests as told by your doctor.  Do not touch or scratch the warts.  Do not treat genital warts with medicines used for treating hand warts.  Do not have sex while you are getting treatment.  Do not douche or use tampons during treatment of HPV.  Tell your sex partner about your infection because he or she may also need treatment.  If you get pregnant, tell your doctor that you had HPV. Your doctor will watch your pregnancy closely. This is important to keep your baby safe.  After treatment, use condoms during sex to prevent future infections.  Have only one sex partner.  Have a sex partner who does not have other sex partners. Contact a doctor if:  The treated skin is red, swollen, or painful.  You have a fever.  You feel ill.  You feel lumps or pimple-like areas in and around your genital area.  You have bleeding of the vagina or the area that was treated.  You have pain during sex. This information is not intended to replace advice given to you by your health care provider. Make sure you discuss any questions you have with your health care provider. Document Released: 11/16/2008 Document Revised: 05/11/2016 Document Reviewed: 03/11/2014 Elsevier Interactive Patient Education  2017 [employer].
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
## 2017-01-02 NOTE — Patient Instructions (Addendum)

## 2017-01-03 LAB — HEPATITIS C ANTIBODY: HCV AB: NEGATIVE

## 2017-01-04 ENCOUNTER — Telehealth: Payer: Self-pay | Admitting: Family Medicine

## 2017-01-04 NOTE — Telephone Encounter (Signed)
Message left to call me back.    Note: AKI worsened after restarting Benicar

## 2017-01-08 ENCOUNTER — Other Ambulatory Visit: Payer: Self-pay | Admitting: Family Medicine

## 2017-01-08 ENCOUNTER — Other Ambulatory Visit: Payer: Self-pay | Admitting: *Deleted

## 2017-01-08 MED ORDER — HYDROCHLOROTHIAZIDE 25 MG PO TABS: 25.0000 mg | ORAL_TABLET | Freq: Every day | ORAL | 3 refills | Status: DC

## 2017-01-08 NOTE — Telephone Encounter (Signed)
Patient is returning provider's call and also wanted to let her know that she is still experiencing feet swelling.  Will forward to MD. Johnney Ou

## 2017-01-08 NOTE — Telephone Encounter (Signed)
Patient still having leg swelling and SOB. She has ECHO scheduled tomorrow. She is aware of this appointment and she intend on going for it.  Norvasc can cause leg swelling although I will like to R/O CHF as a cause.  D/C Norvasc recommended. Last BMet with worsening kidney function discussed with her. This is likely related to her Benicar. Plan to D/C Benicar as well.   As discussed with Patient. 1. Stop Norvasc. 2. Stop Benicar. 3. Get ECHO done. 4. Pick up new prescription at the pharmacy for  HCTZ 25 mg for BP and leg swelling. 5. Follow up as scheduled on 01/16/17.  She read back all instruction to me and verbalized understanding.

## 2017-01-08 NOTE — Telephone Encounter (Signed)
Sent rx to wrong pharmacy. Please send to [facility] per pt Delray Alt, CMA

## 2017-01-09 ENCOUNTER — Ambulatory Visit (HOSPITAL_COMMUNITY)
Admission: RE | Admit: 2017-01-09 | Discharge: 2017-01-09 | Disposition: A | Discharge status: Home / Self Care | Payer: Medicare Other | Source: Ambulatory Visit | Attending: Family Medicine | Admitting: Family Medicine

## 2017-01-09 DIAGNOSIS — I351 Nonrheumatic aortic (valve) insufficiency: Secondary | ICD-10-CM | POA: Diagnosis not present

## 2017-01-09 DIAGNOSIS — I10 Essential (primary) hypertension: Secondary | ICD-10-CM | POA: Diagnosis not present

## 2017-01-09 DIAGNOSIS — R0602 Shortness of breath: Secondary | ICD-10-CM | POA: Diagnosis not present

## 2017-01-10 ENCOUNTER — Telehealth: Payer: Self-pay | Admitting: Family Medicine

## 2017-01-10 DIAGNOSIS — I712 Thoracic aortic aneurysm, without rupture: Secondary | ICD-10-CM

## 2017-01-10 DIAGNOSIS — Z136 Encounter for screening for cardiovascular disorders: Secondary | ICD-10-CM

## 2017-01-10 DIAGNOSIS — I7121 Aneurysm of the ascending aorta, without rupture: Secondary | ICD-10-CM

## 2017-01-10 NOTE — Telephone Encounter (Signed)
ECHO result discussed with patient. Mild aortic insufficiency not changed from the previous. However she now has ascending aortic aneurysm of 4.5 cm. As discussed with her, I will like to obtain an abdominal US to further evaluate her aortic vessels. Since size is <5cm I doubt surgical intervention is warranted. Patient verbalized understanding.

## 2017-01-11 ENCOUNTER — Telehealth: Payer: Self-pay | Admitting: Family Medicine

## 2017-01-11 ENCOUNTER — Other Ambulatory Visit: Payer: Self-pay | Admitting: Family Medicine

## 2017-01-11 MED ORDER — HYDROCHLOROTHIAZIDE 25 MG PO TABS: 25.0000 mg | ORAL_TABLET | Freq: Every day | ORAL | 3 refills | Status: DC

## 2017-01-11 NOTE — Telephone Encounter (Signed)
Please resend HCTZ to [facility]. Pt has been told the prescription has not been received

## 2017-01-11 NOTE — Telephone Encounter (Signed)
I just spoke with the radiology tech. She said the ultrasound order is appropriate. Patient does not need CT with contrast especially with her kidney impairment. Please help schedule ultrasound for AAA. Thanks.

## 2017-01-11 NOTE — Telephone Encounter (Signed)
I called imaging to schedule an apt for pt- per tech- the order needs to be CT Angio Chest with Contrast.

## 2017-01-11 NOTE — Telephone Encounter (Signed)
I called her pharmacy. They have her meds. Please let her know to pick med up today. Thanks.

## 2017-01-11 NOTE — Telephone Encounter (Signed)
Patient is aware of this. Maria Griffith,CMA  

## 2017-01-12 ENCOUNTER — Encounter: Payer: Self-pay | Admitting: *Deleted

## 2017-01-12 NOTE — Telephone Encounter (Signed)
Patient made aware of appointment for AAA on 01/15/17 at 8am and she is to NPO 6-8 hrs per scheduler.  Patient was advised to bring her morning medications to her appointment if she would like to take them once she is finished. Maria Griffith,CMA

## 2017-01-15 ENCOUNTER — Other Ambulatory Visit: Payer: Self-pay | Admitting: Family Medicine

## 2017-01-15 ENCOUNTER — Ambulatory Visit (HOSPITAL_COMMUNITY): Payer: Medicare Other

## 2017-01-15 DIAGNOSIS — I7121 Aneurysm of the ascending aorta, without rupture: Secondary | ICD-10-CM

## 2017-01-15 DIAGNOSIS — I719 Aortic aneurysm of unspecified site, without rupture: Secondary | ICD-10-CM

## 2017-01-15 DIAGNOSIS — Z136 Encounter for screening for cardiovascular disorders: Secondary | ICD-10-CM

## 2017-01-15 DIAGNOSIS — I712 Thoracic aortic aneurysm, without rupture, unspecified: Secondary | ICD-10-CM

## 2017-01-16 ENCOUNTER — Encounter: Payer: Self-pay | Admitting: *Deleted

## 2017-01-16 ENCOUNTER — Encounter: Payer: Self-pay | Admitting: Family Medicine

## 2017-01-16 ENCOUNTER — Ambulatory Visit (INDEPENDENT_AMBULATORY_CARE_PROVIDER_SITE_OTHER): Payer: Medicare Other | Admitting: *Deleted

## 2017-01-16 ENCOUNTER — Other Ambulatory Visit (HOSPITAL_COMMUNITY)
Admission: RE | Admit: 2017-01-16 | Discharge: 2017-01-16 | Disposition: A | Discharge status: Home / Self Care | Payer: Medicare Other | Source: Ambulatory Visit | Attending: Family Medicine | Admitting: Family Medicine

## 2017-01-16 ENCOUNTER — Ambulatory Visit (INDEPENDENT_AMBULATORY_CARE_PROVIDER_SITE_OTHER): Payer: Medicare Other | Admitting: Family Medicine

## 2017-01-16 VITALS — BP 110/72 | HR 87 | Temp 98.7°F

## 2017-01-16 VITALS — BP 110/72 | HR 89 | Temp 98.1°F | Wt 191.0 lb

## 2017-01-16 DIAGNOSIS — Z23 Encounter for immunization: Secondary | ICD-10-CM

## 2017-01-16 DIAGNOSIS — R0600 Dyspnea, unspecified: Secondary | ICD-10-CM

## 2017-01-16 DIAGNOSIS — Z124 Encounter for screening for malignant neoplasm of cervix: Secondary | ICD-10-CM | POA: Diagnosis not present

## 2017-01-16 DIAGNOSIS — R87618 Other abnormal cytological findings on specimens from cervix uteri: Secondary | ICD-10-CM | POA: Diagnosis not present

## 2017-01-16 DIAGNOSIS — R609 Edema, unspecified: Secondary | ICD-10-CM | POA: Diagnosis not present

## 2017-01-16 DIAGNOSIS — N179 Acute kidney failure, unspecified: Secondary | ICD-10-CM

## 2017-01-16 DIAGNOSIS — Z1151 Encounter for screening for human papillomavirus (HPV): Secondary | ICD-10-CM | POA: Diagnosis present

## 2017-01-16 DIAGNOSIS — Z0000 Encounter for general adult medical examination without abnormal findings: Secondary | ICD-10-CM

## 2017-01-16 DIAGNOSIS — Z01419 Encounter for gynecological examination (general) (routine) without abnormal findings: Secondary | ICD-10-CM | POA: Diagnosis not present

## 2017-01-16 DIAGNOSIS — Z9181 History of falling: Secondary | ICD-10-CM

## 2017-01-16 DIAGNOSIS — I10 Essential (primary) hypertension: Secondary | ICD-10-CM | POA: Diagnosis not present

## 2017-01-16 LAB — BASIC METABOLIC PANEL WITH GFR
BUN: 13 mg/dL (ref 7–25)
CALCIUM: 9 mg/dL (ref 8.6–10.4)
CO2: 32 mmol/L — ABNORMAL HIGH (ref 20–31)
Chloride: 104 mmol/L (ref 98–110)
Creat: 0.96 mg/dL (ref 0.50–1.05)
GFR, EST AFRICAN AMERICAN: 78 mL/min (ref >=60)
GFR, EST NON AFRICAN AMERICAN: 67 mL/min (ref >=60)
GLUCOSE: 104 mg/dL — AB (ref 65–99)
Potassium: 3.5 mmol/L (ref 3.5–5.3)
SODIUM: 141 mmol/L (ref 135–146)

## 2017-01-16 MED ORDER — FUROSEMIDE 20 MG PO TABS: 20.0000 mg | ORAL_TABLET | Freq: Every day | ORAL | 0 refills | Status: DC

## 2017-01-16 NOTE — Assessment & Plan Note (Signed)
BP looks good on HCTZ. Will however hold HCTZ for 7 days while on Lasix for leg swelling. F/U in 1 week for reassessment.

## 2017-01-16 NOTE — Patient Instructions (Addendum)
It was nice seeing you today. We did your PAP and repeated your blood test. I will call you with result. I am sorry about your difficulty breathing and leg swelling. I worry you might have CHF although your ECHO looks good. Please start taking Lasix and hold off your HCTZ for now. I will see you back in 1 week for reassessment. In the mean time, I will refer you to the cardiologist as well as for PFT. Call if you have any concern. Monitor your BP at home, keep around 120/70-140/80

## 2017-01-16 NOTE — Patient Instructions (Addendum)
Heart Failure Heart failure means your heart has trouble pumping blood. This makes it hard for your body to work well. Heart failure is usually a long-term (chronic) condition. You must take good care of yourself and follow your doctor's treatment plan. HOME CARE  Take your heart medicine as told by your doctor.  Do not stop taking medicine unless your doctor tells you to.  Do not skip any dose of medicine.  Refill your medicines before they run out.  Take other medicines only as told by your doctor or pharmacist.  Stay active if told by your doctor. The elderly and people with severe heart failure should talk with a doctor about physical activity.  Eat heart-healthy foods. Choose foods that are without trans fat and are low in saturated fat, cholesterol, and salt (sodium). This includes fresh or frozen fruits and vegetables, fish, lean meats, fat-free or low-fat dairy foods, whole grains, and high-fiber foods. Lentils and dried peas and beans (legumes) are also good choices.  Limit salt if told by your doctor.  Cook in a healthy way. Roast, grill, broil, bake, poach, steam, or stir-fry foods.  Limit fluids as told by your doctor.  Weigh yourself every morning. Do this after you pee (urinate) and before you eat breakfast. Write down your weight to give to your doctor.  Take your blood pressure and write it down if your doctor tells you to.  Ask your doctor how to check your pulse. Check your pulse as told.  Lose weight if told by your doctor.  Stop smoking or chewing tobacco. Do not use gum or patches that help you quit without your doctor's approval.  Schedule and go to doctor visits as told.  Nonpregnant women should have no more than 1 drink a day. Men should have no more than 2 drinks a day. Talk to your doctor about drinking alcohol.  Stop illegal drug use.  Stay current with shots (immunizations).  Manage your health conditions as told by your doctor.  Learn to  manage your stress.  Rest when you are tired.  If it is really hot outside:  Avoid intense activities.  Use air conditioning or fans, or get in a cooler place.  Avoid caffeine and alcohol.  Wear loose-fitting, lightweight, and light-colored clothing.  If it is really cold outside:  Avoid intense activities.  Layer your clothing.  Wear mittens or gloves, a hat, and a scarf when going outside.  Avoid alcohol.  Learn about heart failure and get support as needed.  Get help to maintain or improve your quality of life and your ability to care for yourself as needed. GET HELP IF:   You gain weight quickly.  You are more short of breath than usual.  You cannot do your normal activities.  You tire easily.  You cough more than normal, especially with activity.  You have any or more puffiness (swelling) in areas such as your hands, feet, ankles, or belly (abdomen).  You cannot sleep because it is hard to breathe.  You feel like your heart is beating fast (palpitations).  You get dizzy or light-headed when you stand up. GET HELP RIGHT AWAY IF:   You have trouble breathing.  There is a change in mental status, such as becoming less alert or not being able to focus.  You have chest pain or discomfort.  You faint. MAKE SURE YOU:   Understand these instructions.  Will watch your condition.  Will get help right away if you  are not doing well or get worse. This information is not intended to replace advice given to you by your health care provider. Make sure you discuss any questions you have with your health care provider. Document Released: 09/12/2008 Document Revised: 12/25/2014 Document Reviewed: 01/20/2013 Elsevier Interactive Patient Education  2017 [place] Prevention in the Home Introduction Falls can cause injuries. They can happen to people of all ages. There are many things you can do to make your home safe and to help prevent  falls. What can I do on the outside of my home?  Regularly fix the edges of walkways and driveways and fix any cracks.  Remove anything that might make you trip as you walk through a door, such as a raised step or threshold.  Trim any bushes or trees on the path to your home.  Use bright outdoor lighting.  Clear any walking paths of anything that might make someone trip, such as rocks or tools.  Regularly check to see if handrails are loose or broken. Make sure that both sides of any steps have handrails.  Any raised decks and porches should have guardrails on the edges.  Have any leaves, snow, or ice cleared regularly.  Use sand or salt on walking paths during winter.  Clean up any spills in your garage right away. This includes oil or grease spills. What can I do in the bathroom?  Use night lights.  Install grab bars by the toilet and in the tub and shower. Do not use towel bars as grab bars.  Use non-skid mats or decals in the tub or shower.  If you need to sit down in the shower, use a plastic, non-slip stool.  Keep the floor dry. Clean up any water that spills on the floor as soon as it happens.  Remove soap buildup in the tub or shower regularly.  Attach bath mats securely with double-sided non-slip rug tape.  Do not have throw rugs and other things on the floor that can make you trip. What can I do in the bedroom?  Use night lights.  Make sure that you have a light by your bed that is easy to reach.  Do not use any sheets or blankets that are too big for your bed. They should not hang down onto the floor.  Have a firm chair that has side arms. You can use this for support while you get dressed.  Do not have throw rugs and other things on the floor that can make you trip. What can I do in the kitchen?  Clean up any spills right away.  Avoid walking on wet floors.  Keep items that you use a lot in easy-to-reach places.  If you need to reach something  above you, use a strong step stool that has a grab bar.  Keep electrical cords out of the way.  Do not use floor polish or wax that makes floors slippery. If you must use wax, use non-skid floor wax.  Do not have throw rugs and other things on the floor that can make you trip. What can I do with my stairs?  Do not leave any items on the stairs.  Make sure that there are handrails on both sides of the stairs and use them. Fix handrails that are broken or loose. Make sure that handrails are as long as the stairways.  Check any carpeting to make sure that it is firmly attached to the stairs.  Fix any carpet that is loose or worn.  Avoid having throw rugs at the top or bottom of the stairs. If you do have throw rugs, attach them to the floor with carpet tape.  Make sure that you have a light switch at the top of the stairs and the bottom of the stairs. If you do not have them, ask someone to add them for you. What else can I do to help prevent falls?  Wear shoes that:  Do not have high heels.  Have rubber bottoms.  Are comfortable and fit you well.  Are closed at the toe. Do not wear sandals.  If you use a stepladder:  Make sure that it is fully opened. Do not climb a closed stepladder.  Make sure that both sides of the stepladder are locked into place.  Ask someone to hold it for you, if possible.  Clearly mark and make sure that you can see:  Any grab bars or handrails.  First and last steps.  Where the edge of each step is.  Use tools that help you move around (mobility aids) if they are needed. These include:  Canes.  Walkers.  Scooters.  Crutches.  Turn on the lights when you go into a dark area. Replace any light bulbs as soon as they burn out.  Set up your furniture so you have a clear path. Avoid moving your furniture around.  If any of your floors are uneven, fix them.  If there are any pets around you, be aware of where they are.  Review your  medicines with your doctor. Some medicines can make you feel dizzy. This can increase your chance of falling. Ask your doctor what other things that you can do to help prevent falls. This information is not intended to replace advice given to you by your health care provider. Make sure you discuss any questions you have with your health care provider. Document Released: 09/30/2009 Document Revised: 05/11/2016 Document Reviewed: 01/08/2015  2017 [employer]  Health Maintenance, Female Introduction Adopting a healthy lifestyle and getting preventive care can go a long way to promote health and wellness. Talk with your health care provider about what schedule of regular examinations is right for you. This is a good chance for you to check in with your provider about disease prevention and staying healthy. In between checkups, there are plenty of things you can do on your own. Experts have done a lot of research about which lifestyle changes and preventive measures are most likely to keep you healthy. Ask your health care provider for more information. Weight and diet Eat a healthy diet  Be sure to include plenty of vegetables, fruits, low-fat dairy products, and lean protein.  Do not eat a lot of foods high in solid fats, added sugars, or salt.  Get regular exercise. This is one of the most important things you can do for your health.  Most adults should exercise for at least 150 minutes each week. The exercise should increase your heart rate and make you sweat (moderate-intensity exercise).  Most adults should also do strengthening exercises at least twice a week. This is in addition to the moderate-intensity exercise. Maintain a healthy weight  Body mass index (BMI) is a measurement that can be used to identify possible weight problems. It estimates body fat based on height and weight. Your health care provider can help determine your BMI and help you achieve or maintain a healthy weight.  For  females 47  years of age and older:  A BMI below 18.5 is considered underweight.  A BMI of 18.5 to 24.9 is normal.  A BMI of 25 to 29.9 is considered overweight.  A BMI of 30 and above is considered obese. Watch levels of cholesterol and blood lipids  You should start having your blood tested for lipids and cholesterol at 55 years of age, then have this test every 5 years.  You may need to have your cholesterol levels checked more often if:  Your lipid or cholesterol levels are high.  You are older than 55 years of age.  You are at high risk for heart disease. Cancer screening Lung Cancer  Lung cancer screening is recommended for adults 32-37 years old who are at high risk for lung cancer because of a history of smoking.  A yearly low-dose CT scan of the lungs is recommended for people who:  Currently smoke.  Have quit within the past 15 years.  Have at least a 30-pack-year history of smoking. A pack year is smoking an average of one pack of cigarettes a day for 1 year.  Yearly screening should continue until it has been 15 years since you quit.  Yearly screening should stop if you develop a health problem that would prevent you from having lung cancer treatment. Breast Cancer  Practice breast self-awareness. This means understanding how your breasts normally appear and feel.  It also means doing regular breast self-exams. Let your health care provider know about any changes, no matter how small.  If you are in your 20s or 30s, you should have a clinical breast exam (CBE) by a health care provider every 1-3 years as part of a regular health exam.  If you are 72 or older, have a CBE every year. Also consider having a breast X-ray (mammogram) every year.  If you have a family history of breast cancer, talk to your health care provider about genetic screening.  If you are at high risk for breast cancer, talk to your health care provider about having an MRI and a mammogram  every year.  Breast cancer gene (BRCA) assessment is recommended for women who have family members with BRCA-related cancers. BRCA-related cancers include:  Breast.  Ovarian.  Tubal.  Peritoneal cancers.  Results of the assessment will determine the need for genetic counseling and BRCA1 and BRCA2 testing. Cervical Cancer  Your health care provider may recommend that you be screened regularly for cancer of the pelvic organs (ovaries, uterus, and vagina). This screening involves a pelvic examination, including checking for microscopic changes to the surface of your cervix (Pap test). You may be encouraged to have this screening done every 3 years, beginning at age 89.  For women ages 61-65, health care providers may recommend pelvic exams and Pap testing every 3 years, or they may recommend the Pap and pelvic exam, combined with testing for human papilloma virus (HPV), every 5 years. Some types of HPV increase your risk of cervical cancer. Testing for HPV may also be done on women of any age with unclear Pap test results.  Other health care providers may not recommend any screening for nonpregnant women who are considered low risk for pelvic cancer and who do not have symptoms. Ask your health care provider if a screening pelvic exam is right for you.  If you have had past treatment for cervical cancer or a condition that could lead to cancer, you need Pap tests and screening for cancer for at  least 20 years after your treatment. If Pap tests have been discontinued, your risk factors (such as having a new sexual partner) need to be reassessed to determine if screening should resume. Some women have medical problems that increase the chance of getting cervical cancer. In these cases, your health care provider may recommend more frequent screening and Pap tests. Colorectal Cancer  This type of cancer can be detected and often prevented.  Routine colorectal cancer screening usually begins at 55  years of age and continues through 55 years of age.  Your health care provider may recommend screening at an earlier age if you have risk factors for colon cancer.  Your health care provider may also recommend using home test kits to check for hidden blood in the stool.  A small camera at the end of a tube can be used to examine your colon directly (sigmoidoscopy or colonoscopy). This is done to check for the earliest forms of colorectal cancer.  Routine screening usually begins at age 41.  Direct examination of the colon should be repeated every 5-10 years through 55 years of age. However, you may need to be screened more often if early forms of precancerous polyps or small growths are found. Skin Cancer  Check your skin from head to toe regularly.  Tell your health care provider about any new moles or changes in moles, especially if there is a change in a mole's shape or color.  Also tell your health care provider if you have a mole that is larger than the size of a pencil eraser.  Always use sunscreen. Apply sunscreen liberally and repeatedly throughout the day.  Protect yourself by wearing long sleeves, pants, a wide-brimmed hat, and sunglasses whenever you are outside. Heart disease, diabetes, and high blood pressure  High blood pressure causes heart disease and increases the risk of stroke. High blood pressure is more likely to develop in:  People who have blood pressure in the high end of the normal range (130-139/85-89 mm Hg).  People who are overweight or obese.  People who are African American.  If you are 50-39 years of age, have your blood pressure checked every 3-5 years. If you are 55 years of age or older, have your blood pressure checked every year. You should have your blood pressure measured twice-once when you are at a hospital or clinic, and once when you are not at a hospital or clinic. Record the average of the two measurements. To check your blood pressure when  you are not at a hospital or clinic, you can use:  An automated blood pressure machine at a pharmacy.  A home blood pressure monitor.  If you are between 16 years and 45 years old, ask your health care provider if you should take aspirin to prevent strokes.  Have regular diabetes screenings. This involves taking a blood sample to check your fasting blood sugar level.  If you are at a normal weight and have a low risk for diabetes, have this test once every three years after 55 years of age.  If you are overweight and have a high risk for diabetes, consider being tested at a younger age or more often. Preventing infection Hepatitis B  If you have a higher risk for hepatitis B, you should be screened for this virus. You are considered at high risk for hepatitis B if:  You were born in a country where hepatitis B is common. Ask your health care provider which countries are considered  high risk.  Your parents were born in a high-risk country, and you have not been immunized against hepatitis B (hepatitis B vaccine).  You have HIV or AIDS.  You use needles to inject street drugs.  You live with someone who has hepatitis B.  You have had sex with someone who has hepatitis B.  You get hemodialysis treatment.  You take certain medicines for conditions, including cancer, organ transplantation, and autoimmune conditions. Hepatitis C  Blood testing is recommended for:  Everyone born from [birth year] through 1964.  Anyone with known risk factors for hepatitis C. Sexually transmitted infections (STIs)  You should be screened for sexually transmitted infections (STIs) including gonorrhea and chlamydia if:  You are sexually active and are younger than 55 years of age.  You are older than 55 years of age and your health care provider tells you that you are at risk for this type of infection.  Your sexual activity has changed since you were last screened and you are at an increased risk for  chlamydia or gonorrhea. Ask your health care provider if you are at risk.  If you do not have HIV, but are at risk, it may be recommended that you take a prescription medicine daily to prevent HIV infection. This is called pre-exposure prophylaxis (PrEP). You are considered at risk if:  You are sexually active and do not regularly use condoms or know the HIV status of your partner(s).  You take drugs by injection.  You are sexually active with a partner who has HIV. Talk with your health care provider about whether you are at high risk of being infected with HIV. If you choose to begin PrEP, you should first be tested for HIV. You should then be tested every 3 months for as long as you are taking PrEP. Pregnancy  If you are premenopausal and you may become pregnant, ask your health care provider about preconception counseling.  If you may become pregnant, take 400 to 800 micrograms (mcg) of folic acid every day.  If you want to prevent pregnancy, talk to your health care provider about birth control (contraception). Osteoporosis and menopause  Osteoporosis is a disease in which the bones lose minerals and strength with aging. This can result in serious bone fractures. Your risk for osteoporosis can be identified using a bone density scan.  If you are 24 years of age or older, or if you are at risk for osteoporosis and fractures, ask your health care provider if you should be screened.  Ask your health care provider whether you should take a calcium or vitamin D supplement to lower your risk for osteoporosis.  Menopause may have certain physical symptoms and risks.  Hormone replacement therapy may reduce some of these symptoms and risks. Talk to your health care provider about whether hormone replacement therapy is right for you. Follow these instructions at home:  Schedule regular health, dental, and eye exams.  Stay current with your immunizations.  Do not use any tobacco products  including cigarettes, chewing tobacco, or electronic cigarettes.  If you are pregnant, do not drink alcohol.  If you are breastfeeding, limit how much and how often you drink alcohol.  Limit alcohol intake to no more than 1 drink per day for nonpregnant women. One drink equals 12 ounces of beer, 5 ounces of wine, or 1 ounces of hard liquor.  Do not use street drugs.  Do not share needles.  Ask your health care provider for help if you  need support or information about quitting drugs.  Tell your health care provider if you often feel depressed.  Tell your health care provider if you have ever been abused or do not feel safe at home. This information is not intended to replace advice given to you by your health care provider. Make sure you discuss any questions you have with your health care provider. Document Released: 06/19/2011 Document Revised: 05/11/2016 Document Reviewed: 09/07/2015  2017 [employer]   Hearing Loss Introduction Hearing loss is a partial or total loss of the ability to hear. This can be temporary or permanent, and it can happen in one or both ears. Hearing loss may be referred to as deafness. Medical care is necessary to treat hearing loss properly and to prevent the condition from getting worse. Your hearing may partially or completely come back, depending on what caused your hearing loss and how severe it is. In some cases, hearing loss is permanent. What are the causes? Common causes of hearing loss include:  Too much wax in the ear canal.  Infection of the ear canal or middle ear.  Fluid in the middle ear.  Injury to the ear or surrounding area.  An object stuck in the ear.  Prolonged exposure to loud sounds, such as music. Less common causes of hearing loss include:  Tumors in the ear.  Viral or bacterial infections, such as meningitis.  A hole in the eardrum (perforated eardrum).  Problems with the hearing nerve that sends signals between the  brain and the ear.  Certain medicines. What are the signs or symptoms? Symptoms of this condition may include:  Difficulty telling the difference between sounds.  Difficulty following a conversation when there is background noise.  Lack of response to sounds in your environment. This may be most noticeable when you do not respond to startling sounds.  Needing to turn up the volume on the television, radio, etc.  Ringing in the ears.  Dizziness.  Pain in the ears. How is this diagnosed? This condition is diagnosed based on a physical exam and a hearing test (audiometry). The audiometry test will be performed by a hearing specialist (audiologist). You may also be referred to an ear, nose, and throat (ENT) specialist (otolaryngologist). How is this treated? Treatment for recent onset of hearing loss may include:  Ear wax removal.  Being prescribed medicines to prevent infection (antibiotics).  Being prescribed medicines to reduce inflammation (corticosteroids). Follow these instructions at home:  If you were prescribed an antibiotic medicine, take it as told by your health care provider. Do not stop taking the antibiotic even if you start to feel better.  Take over-the-counter and prescription medicines only as told by your health care provider.  Avoid loud noises.  Return to your normal activities as told by your health care provider. Ask your health care provider what activities are safe for you.  Keep all follow-up visits as told by your health care provider. This is important. Contact a health care provider if:  You feel dizzy.  You develop new symptoms.  You vomit or feel nauseous.  You have a fever. Get help right away if:  You develop sudden changes in your vision.  You have severe ear pain.  You have new or increased weakness.  You have a severe headache. This information is not intended to replace advice given to you by your health care provider. Make  sure you discuss any questions you have with your health care provider. Document Released:  12/04/2005 Document Revised: 05/11/2016 Document Reviewed: 04/21/2015  2017 [employer]     Fall Prevention in the Home Introduction Falls can cause injuries. They can happen to people of all ages. There are many things you can do to make your home safe and to help prevent falls. What can I do on the outside of my home?  Regularly fix the edges of walkways and driveways and fix any cracks.  Remove anything that might make you trip as you walk through a door, such as a raised step or threshold.  Trim any bushes or trees on the path to your home.  Use bright outdoor lighting.  Clear any walking paths of anything that might make someone trip, such as rocks or tools.  Regularly check to see if handrails are loose or broken. Make sure that both sides of any steps have handrails.  Any raised decks and porches should have guardrails on the edges.  Have any leaves, snow, or ice cleared regularly.  Use sand or salt on walking paths during winter.  Clean up any spills in your garage right away. This includes oil or grease spills. What can I do in the bathroom?  Use night lights.  Install grab bars by the toilet and in the tub and shower. Do not use towel bars as grab bars.  Use non-skid mats or decals in the tub or shower.  If you need to sit down in the shower, use a plastic, non-slip stool.  Keep the floor dry. Clean up any water that spills on the floor as soon as it happens.  Remove soap buildup in the tub or shower regularly.  Attach bath mats securely with double-sided non-slip rug tape.  Do not have throw rugs and other things on the floor that can make you trip. What can I do in the bedroom?  Use night lights.  Make sure that you have a light by your bed that is easy to reach.  Do not use any sheets or blankets that are too big for your bed. They should not hang down onto the  floor.  Have a firm chair that has side arms. You can use this for support while you get dressed.  Do not have throw rugs and other things on the floor that can make you trip. What can I do in the kitchen?  Clean up any spills right away.  Avoid walking on wet floors.  Keep items that you use a lot in easy-to-reach places.  If you need to reach something above you, use a strong step stool that has a grab bar.  Keep electrical cords out of the way.  Do not use floor polish or wax that makes floors slippery. If you must use wax, use non-skid floor wax.  Do not have throw rugs and other things on the floor that can make you trip. What can I do with my stairs?  Do not leave any items on the stairs.  Make sure that there are handrails on both sides of the stairs and use them. Fix handrails that are broken or loose. Make sure that handrails are as long as the stairways.  Check any carpeting to make sure that it is firmly attached to the stairs. Fix any carpet that is loose or worn.  Avoid having throw rugs at the top or bottom of the stairs. If you do have throw rugs, attach them to the floor with carpet tape.  Make sure that you have a light switch  at the top of the stairs and the bottom of the stairs. If you do not have them, ask someone to add them for you. What else can I do to help prevent falls?  Wear shoes that:  Do not have high heels.  Have rubber bottoms.  Are comfortable and fit you well.  Are closed at the toe. Do not wear sandals.  If you use a stepladder:  Make sure that it is fully opened. Do not climb a closed stepladder.  Make sure that both sides of the stepladder are locked into place.  Ask someone to hold it for you, if possible.  Clearly mark and make sure that you can see:  Any grab bars or handrails.  First and last steps.  Where the edge of each step is.  Use tools that help you move around (mobility aids) if they are needed. These  include:  Canes.  Walkers.  Scooters.  Crutches.  Turn on the lights when you go into a dark area. Replace any light bulbs as soon as they burn out.  Set up your furniture so you have a clear path. Avoid moving your furniture around.  If any of your floors are uneven, fix them.  If there are any pets around you, be aware of where they are.  Review your medicines with your doctor. Some medicines can make you feel dizzy. This can increase your chance of falling. Ask your doctor what other things that you can do to help prevent falls. This information is not intended to replace advice given to you by your health care provider. Make sure you discuss any questions you have with your health care provider. Document Released: 09/30/2009 Document Revised: 05/11/2016 Document Reviewed: 01/08/2015  2017 [employer]  Health Maintenance, Female Introduction Adopting a healthy lifestyle and getting preventive care can go a long way to promote health and wellness. Talk with your health care provider about what schedule of regular examinations is right for you. This is a good chance for you to check in with your provider about disease prevention and staying healthy. In between checkups, there are plenty of things you can do on your own. Experts have done a lot of research about which lifestyle changes and preventive measures are most likely to keep you healthy. Ask your health care provider for more information. Weight and diet Eat a healthy diet  Be sure to include plenty of vegetables, fruits, low-fat dairy products, and lean protein.  Do not eat a lot of foods high in solid fats, added sugars, or salt.  Get regular exercise. This is one of the most important things you can do for your health.  Most adults should exercise for at least 150 minutes each week. The exercise should increase your heart rate and make you sweat (moderate-intensity exercise).  Most adults should also do strengthening  exercises at least twice a week. This is in addition to the moderate-intensity exercise. Maintain a healthy weight  Body mass index (BMI) is a measurement that can be used to identify possible weight problems. It estimates body fat based on height and weight. Your health care provider can help determine your BMI and help you achieve or maintain a healthy weight.  For females 9 years of age and older:  A BMI below 18.5 is considered underweight.  A BMI of 18.5 to 24.9 is normal.  A BMI of 25 to 29.9 is considered overweight.  A BMI of 30 and above is considered obese. Watch levels  of cholesterol and blood lipids  You should start having your blood tested for lipids and cholesterol at 55 years of age, then have this test every 5 years.  You may need to have your cholesterol levels checked more often if:  Your lipid or cholesterol levels are high.  You are older than 55 years of age.  You are at high risk for heart disease. Cancer screening Lung Cancer  Lung cancer screening is recommended for adults 68-20 years old who are at high risk for lung cancer because of a history of smoking.  A yearly low-dose CT scan of the lungs is recommended for people who:  Currently smoke.  Have quit within the past 15 years.  Have at least a 30-pack-year history of smoking. A pack year is smoking an average of one pack of cigarettes a day for 1 year.  Yearly screening should continue until it has been 15 years since you quit.  Yearly screening should stop if you develop a health problem that would prevent you from having lung cancer treatment. Breast Cancer  Practice breast self-awareness. This means understanding how your breasts normally appear and feel.  It also means doing regular breast self-exams. Let your health care provider know about any changes, no matter how small.  If you are in your 20s or 30s, you should have a clinical breast exam (CBE) by a health care provider every 1-3  years as part of a regular health exam.  If you are 37 or older, have a CBE every year. Also consider having a breast X-ray (mammogram) every year.  If you have a family history of breast cancer, talk to your health care provider about genetic screening.  If you are at high risk for breast cancer, talk to your health care provider about having an MRI and a mammogram every year.  Breast cancer gene (BRCA) assessment is recommended for women who have family members with BRCA-related cancers. BRCA-related cancers include:  Breast.  Ovarian.  Tubal.  Peritoneal cancers.  Results of the assessment will determine the need for genetic counseling and BRCA1 and BRCA2 testing. Cervical Cancer  Your health care provider may recommend that you be screened regularly for cancer of the pelvic organs (ovaries, uterus, and vagina). This screening involves a pelvic examination, including checking for microscopic changes to the surface of your cervix (Pap test). You may be encouraged to have this screening done every 3 years, beginning at age 72.  For women ages 19-65, health care providers may recommend pelvic exams and Pap testing every 3 years, or they may recommend the Pap and pelvic exam, combined with testing for human papilloma virus (HPV), every 5 years. Some types of HPV increase your risk of cervical cancer. Testing for HPV may also be done on women of any age with unclear Pap test results.  Other health care providers may not recommend any screening for nonpregnant women who are considered low risk for pelvic cancer and who do not have symptoms. Ask your health care provider if a screening pelvic exam is right for you.  If you have had past treatment for cervical cancer or a condition that could lead to cancer, you need Pap tests and screening for cancer for at least 20 years after your treatment. If Pap tests have been discontinued, your risk factors (such as having a new sexual partner) need to  be reassessed to determine if screening should resume. Some women have medical problems that increase the chance of getting  cervical cancer. In these cases, your health care provider may recommend more frequent screening and Pap tests. Colorectal Cancer  This type of cancer can be detected and often prevented.  Routine colorectal cancer screening usually begins at 55 years of age and continues through 55 years of age.  Your health care provider may recommend screening at an earlier age if you have risk factors for colon cancer.  Your health care provider may also recommend using home test kits to check for hidden blood in the stool.  A small camera at the end of a tube can be used to examine your colon directly (sigmoidoscopy or colonoscopy). This is done to check for the earliest forms of colorectal cancer.  Routine screening usually begins at age 32.  Direct examination of the colon should be repeated every 5-10 years through 55 years of age. However, you may need to be screened more often if early forms of precancerous polyps or small growths are found. Skin Cancer  Check your skin from head to toe regularly.  Tell your health care provider about any new moles or changes in moles, especially if there is a change in a mole's shape or color.  Also tell your health care provider if you have a mole that is larger than the size of a pencil eraser.  Always use sunscreen. Apply sunscreen liberally and repeatedly throughout the day.  Protect yourself by wearing long sleeves, pants, a wide-brimmed hat, and sunglasses whenever you are outside. Heart disease, diabetes, and high blood pressure  High blood pressure causes heart disease and increases the risk of stroke. High blood pressure is more likely to develop in:  People who have blood pressure in the high end of the normal range (130-139/85-89 mm Hg).  People who are overweight or obese.  People who are African American.  If you are  65-65 years of age, have your blood pressure checked every 3-5 years. If you are 36 years of age or older, have your blood pressure checked every year. You should have your blood pressure measured twice-once when you are at a hospital or clinic, and once when you are not at a hospital or clinic. Record the average of the two measurements. To check your blood pressure when you are not at a hospital or clinic, you can use:  An automated blood pressure machine at a pharmacy.  A home blood pressure monitor.  If you are between 16 years and 48 years old, ask your health care provider if you should take aspirin to prevent strokes.  Have regular diabetes screenings. This involves taking a blood sample to check your fasting blood sugar level.  If you are at a normal weight and have a low risk for diabetes, have this test once every three years after 55 years of age.  If you are overweight and have a high risk for diabetes, consider being tested at a younger age or more often. Preventing infection Hepatitis B  If you have a higher risk for hepatitis B, you should be screened for this virus. You are considered at high risk for hepatitis B if:  You were born in a country where hepatitis B is common. Ask your health care provider which countries are considered high risk.  Your parents were born in a high-risk country, and you have not been immunized against hepatitis B (hepatitis B vaccine).  You have HIV or AIDS.  You use needles to inject street drugs.  You live with someone who has  hepatitis B.  You have had sex with someone who has hepatitis B.  You get hemodialysis treatment.  You take certain medicines for conditions, including cancer, organ transplantation, and autoimmune conditions. Hepatitis C  Blood testing is recommended for:  Everyone born from [birth year] through 1964.  Anyone with known risk factors for hepatitis C. Sexually transmitted infections (STIs)  You should be screened  for sexually transmitted infections (STIs) including gonorrhea and chlamydia if:  You are sexually active and are younger than 55 years of age.  You are older than 55 years of age and your health care provider tells you that you are at risk for this type of infection.  Your sexual activity has changed since you were last screened and you are at an increased risk for chlamydia or gonorrhea. Ask your health care provider if you are at risk.  If you do not have HIV, but are at risk, it may be recommended that you take a prescription medicine daily to prevent HIV infection. This is called pre-exposure prophylaxis (PrEP). You are considered at risk if:  You are sexually active and do not regularly use condoms or know the HIV status of your partner(s).  You take drugs by injection.  You are sexually active with a partner who has HIV. Talk with your health care provider about whether you are at high risk of being infected with HIV. If you choose to begin PrEP, you should first be tested for HIV. You should then be tested every 3 months for as long as you are taking PrEP. Pregnancy  If you are premenopausal and you may become pregnant, ask your health care provider about preconception counseling.  If you may become pregnant, take 400 to 800 micrograms (mcg) of folic acid every day.  If you want to prevent pregnancy, talk to your health care provider about birth control (contraception). Osteoporosis and menopause  Osteoporosis is a disease in which the bones lose minerals and strength with aging. This can result in serious bone fractures. Your risk for osteoporosis can be identified using a bone density scan.  If you are 53 years of age or older, or if you are at risk for osteoporosis and fractures, ask your health care provider if you should be screened.  Ask your health care provider whether you should take a calcium or vitamin D supplement to lower your risk for osteoporosis.  Menopause may  have certain physical symptoms and risks.  Hormone replacement therapy may reduce some of these symptoms and risks. Talk to your health care provider about whether hormone replacement therapy is right for you. Follow these instructions at home:  Schedule regular health, dental, and eye exams.  Stay current with your immunizations.  Do not use any tobacco products including cigarettes, chewing tobacco, or electronic cigarettes.  If you are pregnant, do not drink alcohol.  If you are breastfeeding, limit how much and how often you drink alcohol.  Limit alcohol intake to no more than 1 drink per day for nonpregnant women. One drink equals 12 ounces of beer, 5 ounces of wine, or 1 ounces of hard liquor.  Do not use street drugs.  Do not share needles.  Ask your health care provider for help if you need support or information about quitting drugs.  Tell your health care provider if you often feel depressed.  Tell your health care provider if you have ever been abused or do not feel safe at home. This information is not intended to replace  advice given to you by your health care provider. Make sure you discuss any questions you have with your health care provider. Document Released: 06/19/2011 Document Revised: 05/11/2016 Document Reviewed: 09/07/2015  2017 [employer]   Hearing Loss Introduction Hearing loss is a partial or total loss of the ability to hear. This can be temporary or permanent, and it can happen in one or both ears. Hearing loss may be referred to as deafness. Medical care is necessary to treat hearing loss properly and to prevent the condition from getting worse. Your hearing may partially or completely come back, depending on what caused your hearing loss and how severe it is. In some cases, hearing loss is permanent. What are the causes? Common causes of hearing loss include:  Too much wax in the ear canal.  Infection of the ear canal or middle ear.  Fluid in  the middle ear.  Injury to the ear or surrounding area.  An object stuck in the ear.  Prolonged exposure to loud sounds, such as music. Less common causes of hearing loss include:  Tumors in the ear.  Viral or bacterial infections, such as meningitis.  A hole in the eardrum (perforated eardrum).  Problems with the hearing nerve that sends signals between the brain and the ear.  Certain medicines. What are the signs or symptoms? Symptoms of this condition may include:  Difficulty telling the difference between sounds.  Difficulty following a conversation when there is background noise.  Lack of response to sounds in your environment. This may be most noticeable when you do not respond to startling sounds.  Needing to turn up the volume on the television, radio, etc.  Ringing in the ears.  Dizziness.  Pain in the ears. How is this diagnosed? This condition is diagnosed based on a physical exam and a hearing test (audiometry). The audiometry test will be performed by a hearing specialist (audiologist). You may also be referred to an ear, nose, and throat (ENT) specialist (otolaryngologist). How is this treated? Treatment for recent onset of hearing loss may include:  Ear wax removal.  Being prescribed medicines to prevent infection (antibiotics).  Being prescribed medicines to reduce inflammation (corticosteroids). Follow these instructions at home:  If you were prescribed an antibiotic medicine, take it as told by your health care provider. Do not stop taking the antibiotic even if you start to feel better.  Take over-the-counter and prescription medicines only as told by your health care provider.  Avoid loud noises.  Return to your normal activities as told by your health care provider. Ask your health care provider what activities are safe for you.  Keep all follow-up visits as told by your health care provider. This is important. Contact a health care provider  if:  You feel dizzy.  You develop new symptoms.  You vomit or feel nauseous.  You have a fever. Get help right away if:  You develop sudden changes in your vision.  You have severe ear pain.  You have new or increased weakness.  You have a severe headache. This information is not intended to replace advice given to you by your health care provider. Make sure you discuss any questions you have with your health care provider. Document Released: 12/04/2005 Document Revised: 05/11/2016 Document Reviewed: 04/21/2015  2017 [employer]
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
## 2017-01-16 NOTE — Assessment & Plan Note (Signed)
PAP completed today 

## 2017-01-16 NOTE — Assessment & Plan Note (Signed)
PAP repeated today. I will contact her with result.

## 2017-01-16 NOTE — Progress Notes (Signed)
FMTS ATTENDING NOTE Nashia Remus,MD I  have seen and examined this patient, reviewed their chart. I have discussed this patient with Laurenze Ducatte. I agree with the  findings, assessment and care plan.  Things to follow up on: Patient high fall risk with TUG at 36 seconds and multiple falls in last year. (Will need PT assessment, referral placed).  Failed Mini-cog with score 2/5 ( I will readdress at next visit).  Did poorly on hearing test as well ( I will readdress at next visit).

## 2017-01-16 NOTE — Assessment & Plan Note (Signed)
Leg swelling worsened. I initially was going to give IM Lasix today but repeat BP was in the 110/70. She took her HCTZ few min ago. Will start oral Lasix tomorrow for 7 days. F/U with me then for reassessment. If she improved I might d/c HCTZ and continue Lasix. Return precaution discussed.

## 2017-01-16 NOTE — Addendum Note (Signed)
Addended by: Andrena Mews T on: 01/16/2017 10:49 AM   Modules accepted: Orders

## 2017-01-16 NOTE — Assessment & Plan Note (Signed)
ECHO reviewed and discussed with her. Normal EF. She might have some element of diastolic dysfunction. Lung exam clear. Given leg edema she will benefit from Lasix. Will treat for 1 week and reassessment. Hold HCTZ while on Lasix. Also referred to Cards.

## 2017-01-16 NOTE — Assessment & Plan Note (Signed)
Recheck BMET today. I will contact her with result.

## 2017-01-17 ENCOUNTER — Telehealth: Payer: Self-pay | Admitting: Family Medicine

## 2017-01-17 LAB — CYTOLOGY - PAP
Adequacy: ABSENT
Diagnosis: NEGATIVE
HPV (WINDOPATH): DETECTED — AB

## 2017-01-17 NOTE — Telephone Encounter (Signed)
She returned my call. I discussed her test result with normal creatinine level. She started her Lasix yesterday and has started urinating a lot. Her legs are still swollen. She stopped the HCTZ already.  I advised she can go up on Lasix from 20 mg to 40 daily provided her BP remains >140/80 and that she will need to call me prior to making this change. I also advised her that her Kidney function might go up while she is on Lasix. I will reassess her next Tuesday. F/U sooner if needed.

## 2017-01-17 NOTE — Telephone Encounter (Signed)
HIPPA compliant message left to call back.    

## 2017-01-18 ENCOUNTER — Telehealth: Payer: Self-pay | Admitting: Family Medicine

## 2017-01-18 NOTE — Telephone Encounter (Signed)
PAP result discussed. Colposcopy recommended. Appointment made for her on the 15th. All questions were answered.

## 2017-01-22 ENCOUNTER — Ambulatory Visit: Payer: Self-pay | Admitting: Pharmacist

## 2017-01-23 ENCOUNTER — Ambulatory Visit (HOSPITAL_COMMUNITY): Payer: Medicare Other

## 2017-01-23 ENCOUNTER — Ambulatory Visit: Payer: Self-pay | Admitting: Family Medicine

## 2017-01-26 ENCOUNTER — Other Ambulatory Visit: Payer: Self-pay | Admitting: *Deleted

## 2017-01-29 ENCOUNTER — Other Ambulatory Visit: Payer: Self-pay | Admitting: Family Medicine

## 2017-01-29 ENCOUNTER — Encounter: Payer: Self-pay | Admitting: Pharmacist

## 2017-01-29 ENCOUNTER — Ambulatory Visit (INDEPENDENT_AMBULATORY_CARE_PROVIDER_SITE_OTHER): Payer: Medicare Other | Admitting: Pharmacist

## 2017-01-29 DIAGNOSIS — J4540 Moderate persistent asthma, uncomplicated: Secondary | ICD-10-CM

## 2017-01-29 NOTE — Progress Notes (Signed)
   S:    Patient arrives ambulating with a cane and with niece at her side.  Presents for lung function evaluation.  Patient was last seen by Primary Care Provider on 01/16/2017.   Patient reports breathing has been declining since 3-4 years ago. Patient reports sleeps on several pillows nightly to avoid shortness of breath, wheezing, and back pain. She states she can only tolerate ~10 minutes of laying flat. She also states she can walk about 3-4 steps and needs to stop to catch her breath.   She feels like it is hard to keep up with friends when walking and frequently has to stop to catch her breath when walking independently. Again, this has been going on for 3-4 years.   She denies any obvious chemical or environmental exposures. She does report spinal surgery in 2014, after which she noted a sharp decline in her respiratory function.   Patient reports last dose of asthma medications was yesterday.   Also of note, recent change from HCTZ to furosemide by primary care provider for swelling in her feet/legs. Patient reports marked improvement in her lower extremity edema.   O: MMRC > 2   See "scanned report" or Documentation Flowsheet (discrete results - PFTs) for  Spirometry results. Patient provided good effort while attempting spirometry.   Albuterol neb x1 LOT # [id number], exp Sept 2019  Edema in bilateral lower extremities no more than 1+ pitting edema. Instructions given to elevate legs above heart level to help with swelling.   A/P:  Spirometry evaluation reveals Severe restrictive lung disease. Post nebulized albuterol tx revealed significant improvement in lung function.  Patient appears to have overall deconditioning, and was easily fatigued with minimal respiratory effort during spirometry. Encouraged patient to increase exercise tolerance slowly and cautiously, by adding 1-2 steps or a few extra minutes of walking as tolerated. Patient has been experiencing shortness of breath and  fatigue for 3-4 years and taking Advair 1 puff once daily and albuterol PRN.  Patient had notable knowledge deficit related to proper use of her inhalers. Extensive education provided for patient on purpose, proper use, potential adverse effects of both albuterol and Advair, including risk of esophageal candidiasis and need to rinse mouth after each use.  Expect improvement in her lung function and symptoms with proper use of her inhalers. Reviewed results of pulmonary function tests. Patient verbalized understanding of results and education.  Lower extremity swelling improved with use of furosemide. Instructions given to elevate legs above heart level to help with swelling. Noted BP greater than goal at today's visit. Re-evaluate at next visit and might consider alternative agent, if needed (note: recent discontinuation of HCTZ and initiation of furosemide).   Patient expressed concerns regarding dry mouth, which could possibly be attributed to anticholinergic side effects from her oxybutynin specifically. Could consider trialing other agents in the future, if this continues to be an issue suggestions include solifenacin or mirabegron.      Written pt instructions provided.  F/U Clinic visit with PCP at regular scheduled visit.   Total time in face to face counseling 30 minutes.  Patient seen with Maylon Cos, PharmD PGY1 Resident.  Marland Kitchen

## 2017-01-29 NOTE — Patient Instructions (Addendum)
Initial lung function evaluation showed very severe lung restriction. After the nebulizer treatment, your lung function improved.   Consider working on your exercise tolerance. Try to add a step or a few more minutes of walking as you can tolerate it.   Start taking your Advair 1 puff twice daily.   Follow-up with your primary care provider at your next appointment.   Ask your doctor if she would like you to follow-up with Dr. Valentina Lucks again in the future.

## 2017-01-29 NOTE — Assessment & Plan Note (Signed)
Spirometry evaluation reveals Severe restrictive lung disease. Post nebulized albuterol tx revealed significant improvement in lung function.  Patient appears to have overall deconditioning, and was easily fatigued with minimal respiratory effort during spirometry. Encouraged patient to increase exercise tolerance slowly and cautiously, by adding 1-2 steps or a few extra minutes of walking as tolerated. Patient has been experiencing shortness of breath and fatigue for 3-4 years and taking Advair 1 puff once daily and albuterol PRN.  Patient had notable knowledge deficit related to proper use of her inhalers. Extensive education provided for patient on purpose, proper use, potential adverse effects of both albuterol and Advair, including risk of esophageal candidiasis and need to rinse mouth after each use.  Expect improvement in her lung function and symptoms with proper use of her inhalers. Reviewed results of pulmonary function tests. Patient verbalized understanding of results and education.

## 2017-01-30 ENCOUNTER — Encounter: Payer: Self-pay | Admitting: Registered Nurse

## 2017-01-30 ENCOUNTER — Telehealth: Payer: Self-pay | Admitting: Registered Nurse

## 2017-01-30 ENCOUNTER — Encounter: Payer: Worker's Compensation | Attending: Physical Medicine & Rehabilitation | Admitting: Registered Nurse

## 2017-01-30 VITALS — BP 137/90 | HR 74

## 2017-01-30 DIAGNOSIS — M436 Torticollis: Secondary | ICD-10-CM | POA: Diagnosis not present

## 2017-01-30 DIAGNOSIS — M791 Myalgia: Secondary | ICD-10-CM | POA: Diagnosis not present

## 2017-01-30 DIAGNOSIS — G894 Chronic pain syndrome: Secondary | ICD-10-CM | POA: Diagnosis present

## 2017-01-30 DIAGNOSIS — M961 Postlaminectomy syndrome, not elsewhere classified: Secondary | ICD-10-CM

## 2017-01-30 DIAGNOSIS — Z5181 Encounter for therapeutic drug level monitoring: Secondary | ICD-10-CM | POA: Diagnosis not present

## 2017-01-30 DIAGNOSIS — M4722 Other spondylosis with radiculopathy, cervical region: Secondary | ICD-10-CM

## 2017-01-30 DIAGNOSIS — Z79899 Other long term (current) drug therapy: Secondary | ICD-10-CM | POA: Diagnosis not present

## 2017-01-30 DIAGNOSIS — M7918 Myalgia, other site: Secondary | ICD-10-CM

## 2017-01-30 DIAGNOSIS — M5412 Radiculopathy, cervical region: Secondary | ICD-10-CM

## 2017-01-30 DIAGNOSIS — M62838 Other muscle spasm: Secondary | ICD-10-CM

## 2017-01-30 MED ORDER — HYDROCODONE-ACETAMINOPHEN 7.5-325 MG PO TABS
1.0000 | ORAL_TABLET | Freq: Four times a day (QID) | ORAL | 0 refills | Status: DC | PRN
Start: 2017-01-30 — End: 2017-02-27

## 2017-01-30 MED ORDER — POLYETHYLENE GLYCOL 3350 17 GM/SCOOP PO POWD: ORAL | 1 refills | Status: DC

## 2017-01-30 MED ORDER — SENNOSIDES-DOCUSATE SODIUM 8.6-50 MG PO TABS: 1.0000 | ORAL_TABLET | Freq: Every day | ORAL | 5 refills | Status: DC

## 2017-01-30 MED ORDER — DICLOFENAC SODIUM 1 % TD GEL
TRANSDERMAL | 2 refills | Status: DC
Start: 2017-01-30 — End: 2017-04-30

## 2017-01-30 NOTE — Telephone Encounter (Signed)
On 01/30/2017 the [place] was reviewed no conflict was seen on the [place] with multiple prescribers. Maria Griffith has a signed narcotic contract with our office. If there were any discrepancies this would have been reported to/her physician.
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
## 2017-02-01 ENCOUNTER — Ambulatory Visit: Payer: Self-pay

## 2017-02-01 NOTE — Progress Notes (Signed)
Patient ID: Maria Griffith, female   DOB: [birth year], 56 y.o.   MRN: [id number] Reviewed: Agree with Dr. Graylin Shiver documentation and management.

## 2017-02-02 ENCOUNTER — Encounter: Payer: Self-pay | Admitting: Physician Assistant

## 2017-02-04 LAB — TOXASSURE SELECT,+ANTIDEPR,UR

## 2017-02-06 ENCOUNTER — Ambulatory Visit (HOSPITAL_COMMUNITY): Payer: Medicare Other

## 2017-02-06 ENCOUNTER — Ambulatory Visit: Payer: Self-pay | Admitting: Family Medicine

## 2017-02-07 ENCOUNTER — Encounter: Payer: Self-pay | Admitting: Family Medicine

## 2017-02-12 ENCOUNTER — Ambulatory Visit: Payer: Medicare Other | Admitting: Physician Assistant

## 2017-02-15 ENCOUNTER — Ambulatory Visit: Payer: Self-pay

## 2017-02-19 ENCOUNTER — Encounter: Payer: Self-pay | Admitting: Physician Assistant

## 2017-02-19 ENCOUNTER — Ambulatory Visit (INDEPENDENT_AMBULATORY_CARE_PROVIDER_SITE_OTHER): Payer: Medicare Other | Admitting: Physician Assistant

## 2017-02-19 VITALS — BP 160/100 | HR 82 | Ht 62.0 in | Wt 192.8 lb

## 2017-02-19 DIAGNOSIS — I10 Essential (primary) hypertension: Secondary | ICD-10-CM

## 2017-02-19 DIAGNOSIS — R0609 Other forms of dyspnea: Secondary | ICD-10-CM | POA: Diagnosis not present

## 2017-02-19 DIAGNOSIS — I351 Nonrheumatic aortic (valve) insufficiency: Secondary | ICD-10-CM | POA: Diagnosis not present

## 2017-02-19 DIAGNOSIS — R079 Chest pain, unspecified: Secondary | ICD-10-CM | POA: Diagnosis not present

## 2017-02-19 MED ORDER — LISINOPRIL 10 MG PO TABS: 10.0000 mg | ORAL_TABLET | Freq: Every day | ORAL | 3 refills | Status: DC

## 2017-02-19 NOTE — Patient Instructions (Addendum)
Medication Instructions:  Your physician has recommended you make the following change in your medication:  1.  START Lisinopril 10 mg taking 1 tablet every day   Labwork: None ordered  Testing/Procedures: Your physician has requested that you have a lexiscan myoview. For further information please visit [email or web address]. Please follow instruction sheet, as given.   Follow-Up: Your physician recommends that you schedule a follow-up appointment in: [place], PA-C OR PHARM-D FOR BP CHECK Your physician recommends that you schedule a follow-up appointment in: [place]    Any Other Special Instructions Will Be Listed Below (If Applicable).  Heart-Healthy Eating Plan  NO MORE THAN 2000 MILLIGRAMS OF SALT A DAY Heart-healthy meal planning includes:  Limiting unhealthy fats.  Increasing healthy fats.  Making other small dietary changes. You may need to talk with your doctor or a diet specialist (dietitian) to create an eating plan that is right for you. What types of fat should I choose?  Choose healthy fats. These include olive oil and canola oil, flaxseeds, walnuts, almonds, and seeds.  Eat more omega-3 fats. These include salmon, mackerel, sardines, tuna, flaxseed oil, and ground flaxseeds. Try to eat fish at least twice each week.  Limit saturated fats.  Saturated fats are often found in animal products, such as meats, butter, and cream.  Plant sources of saturated fats include palm oil, palm kernel oil, and coconut oil.  Avoid foods with partially hydrogenated oils in them. These include stick margarine, some tub margarines, cookies, crackers, and other baked goods. These contain trans fats. What general guidelines do I need to follow?  Check food labels carefully. Identify foods with trans fats or high amounts of saturated fat.  Fill one half of your plate with vegetables and green salads. Eat 4-5 servings of vegetables per day. A  serving of vegetables is:  1 cup of raw leafy vegetables.   cup of raw or cooked cut-up vegetables.   cup of vegetable juice.  Fill one fourth of your plate with whole grains. Look for the word "whole" as the first word in the ingredient list.  Fill one fourth of your plate with lean protein foods.  Eat 4-5 servings of fruit per day. A serving of fruit is:  One medium whole fruit.   cup of dried fruit.   cup of fresh, frozen, or canned fruit.   cup of 100% fruit juice.  Eat more foods that contain soluble fiber. These include apples, broccoli, carrots, beans, peas, and barley. Try to get 20-30 g of fiber per day.  Eat more home-cooked food. Eat less restaurant, buffet, and fast food.  Limit or avoid alcohol.  Limit foods high in starch and sugar.  Avoid fried foods.  Avoid frying your food. Try baking, boiling, grilling, or broiling it instead. You can also reduce fat by:  Removing the skin from poultry.  Removing all visible fats from meats.  Skimming the fat off of stews, soups, and gravies before serving them.  Steaming vegetables in water or broth.  Lose weight if you are overweight.  Eat 4-5 servings of nuts, legumes, and seeds per week:  One serving of dried beans or legumes equals  cup after being cooked.  One serving of nuts equals 1 ounces.  One serving of seeds equals  ounce or one tablespoon.  You may need to keep track of how much salt or sodium you eat. This is especially true if you have high blood pressure. Talk  with your doctor or dietitian to get more information. What foods can I eat? Grains  Breads, including [place], white, pita, wheat, raisin, rye, oatmeal, and [place]. Tortillas that are neither fried nor made with lard or trans fat. Low-fat rolls, including hotdog and hamburger buns and English muffins. Biscuits. Muffins. Waffles. Pancakes. Light popcorn. Whole-grain cereals. Flatbread. Melba toast. Pretzels. Breadsticks. Rusks.  Low-fat snacks. Low-fat crackers, including oyster, saltine, matzo, graham, animal, and rye. Rice and pasta, including brown rice and pastas that are made with whole wheat. Vegetables  All vegetables. Fruits  All fruits, but limit coconut. Meats and Other Protein Sources  Lean, well-trimmed beef, veal, pork, and lamb. Chicken and [place] without skin. All fish and shellfish. Wild duck, rabbit, pheasant, and venison. Egg whites or low-cholesterol egg substitutes. Dried beans, peas, lentils, and tofu. Seeds and most nuts. Dairy  Low-fat or nonfat cheeses, including ricotta, string, and mozzarella. Skim or 1% milk that is liquid, powdered, or evaporated. Buttermilk that is made with low-fat milk. Nonfat or low-fat yogurt. Beverages  Mineral water. Diet carbonated beverages. Sweets and Desserts  Sherbets and fruit ices. Honey, jam, marmalade, jelly, and syrups. Meringues and gelatins. Pure sugar candy, such as hard candy, jelly beans, gumdrops, mints, marshmallows, and small amounts of dark chocolate. [employer] Eat all sweets and desserts in moderation. Fats and Oils  Nonhydrogenated (trans-free) margarines. Vegetable oils, including soybean, sesame, sunflower, olive, peanut, safflower, corn, canola, and cottonseed. Salad dressings or mayonnaise made with a vegetable oil. Limit added fats and oils that you use for cooking, baking, salads, and as spreads. Other  Cocoa powder. Coffee and tea. All seasonings and condiments. The items listed above may not be a complete list of recommended foods or beverages. Contact your dietitian for more options.  What foods are not recommended? Grains  Breads that are made with saturated or trans fats, oils, or whole milk. Croissants. Butter rolls. Cheese breads. Sweet rolls. Donuts. Buttered popcorn. Chow mein noodles. High-fat crackers, such as cheese or butter crackers. Meats and Other Protein Sources  Fatty meats, such as hotdogs, short ribs, sausage,  spareribs, bacon, rib eye roast or steak, and mutton. High-fat deli meats, such as salami and bologna. Caviar. Domestic duck and goose. Organ meats, such as kidney, liver, sweetbreads, and heart. Dairy  Cream, sour cream, cream cheese, and creamed cottage cheese. Whole-milk cheeses, including blue (bleu), Monte[place] Jack, [place], [place], American, [place], Swiss, cheddar, [place], and [place]. Whole or 2% milk that is liquid, evaporated, or condensed. Whole buttermilk. Cream sauce or high-fat cheese sauce. Yogurt that is made from whole milk. Beverages  Regular sodas and juice drinks with added sugar. Sweets and Desserts  Frosting. Pudding. Cookies. Cakes other than angel food cake. Candy that has milk chocolate or white chocolate, hydrogenated fat, butter, coconut, or unknown ingredients. Buttered syrups. Full-fat ice cream or ice cream drinks. Fats and Oils  Gravy that has suet, meat fat, or shortening. Cocoa butter, hydrogenated oils, palm oil, coconut oil, palm kernel oil. These can often be found in baked products, candy, fried foods, nondairy creamers, and whipped toppings. Solid fats and shortenings, including bacon fat, salt pork, lard, and butter. Nondairy cream substitutes, such as coffee creamers and sour cream substitutes. Salad dressings that are made of unknown oils, cheese, or sour cream. The items listed above may not be a complete list of foods and beverages to avoid. Contact your dietitian for more information.  This information is not intended to replace advice given to you  by your health care provider. Make sure you discuss any questions you have with your health care provider. Document Released: 06/04/2012 Document Revised: 05/11/2016 Document Reviewed: 05/28/2014 Elsevier Interactive Patient Education  2017 [place].    Pharmacologic Stress Electrocardiogram Introduction A pharmacologic stress electrocardiogram is a heart (cardiac) test that uses nuclear imaging to evaluate  the blood supply to your heart. This test may also be called a pharmacologic stress electrocardiography. Pharmacologic means that a medicine is used to increase your heart rate and blood pressure. This stress test is done to find areas of poor blood flow to the heart by determining the extent of coronary artery disease (CAD). Some people exercise on a treadmill, which naturally increases the blood flow to the heart. For those people unable to exercise on a treadmill, a medicine is used. This medicine stimulates your heart and will cause your heart to beat harder and more quickly, as if you were exercising. Pharmacologic stress tests can help determine:  The adequacy of blood flow to your heart during increased levels of activity in order to clear you for discharge home.  The extent of coronary artery blockage caused by CAD.  Your prognosis if you have suffered a heart attack.  The effectiveness of cardiac procedures done, such as an angioplasty, which can increase the circulation in your coronary arteries.  Causes of chest pain or pressure. LET [facility] CARE PROVIDER KNOW ABOUT:  Any allergies you have.  All medicines you are taking, including vitamins, herbs, eye drops, creams, and over-the-counter medicines.  Previous problems you or members of your family have had with the use of anesthetics.  Any blood disorders you have.  Previous surgeries you have had.  Medical conditions you have.  Possibility of pregnancy, if this applies.  If you are currently breastfeeding. RISKS AND COMPLICATIONS Generally, this is a safe procedure. However, as with any procedure, complications can occur. Possible complications include:  You develop pain or pressure in the following areas:  Chest.  Jaw or neck.  Between your shoulder blades.  Radiating down your left arm.  Headache.  Dizziness or light-headedness.  Shortness of breath.  Increased or irregular heartbeat.  Low blood  pressure.  Nausea or vomiting.  Flushing.  Redness going up the arm and slight pain during injection of medicine.  Heart attack (rare). BEFORE THE PROCEDURE  Avoid all forms of caffeine for 24 hours before your test or as directed by your health care provider. This includes coffee, tea (even decaffeinated tea), caffeinated sodas, chocolate, cocoa, and certain pain medicines.  Follow your health care provider's instructions regarding eating and drinking before the test.  Take your medicines as directed at regular times with water unless instructed otherwise. Exceptions may include:  If you have diabetes, ask how you are to take your insulin or pills. It is common to adjust insulin dosing the morning of the test.  If you are taking beta-blocker medicines, it is important to talk to your health care provider about these medicines well before the date of your test. Taking beta-blocker medicines may interfere with the test. In some cases, these medicines need to be changed or stopped 24 hours or more before the test.  If you wear a nitroglycerin patch, it may need to be removed prior to the test. Ask your health care provider if the patch should be removed before the test.  If you use an inhaler for any breathing condition, bring it with you to the test.  If you  are an outpatient, bring a snack so you can eat right after the stress phase of the test.  Do not smoke for 4 hours prior to the test or as directed by your health care provider.  Do not apply lotions, powders, creams, or oils on your chest prior to the test.  Wear comfortable shoes and clothing. Let your health care provider know if you were unable to complete or follow the preparations for your test. PROCEDURE  Multiple patches (electrodes) will be put on your chest. If needed, small areas of your chest may be shaved to get better contact with the electrodes. Once the electrodes are attached to your body, multiple wires will  be attached to the electrodes, and your heart rate will be monitored.  An IV access will be started. A nuclear trace (isotope) is given. The isotope may be given intravenously, or it may be swallowed. Nuclear refers to several types of radioactive isotopes, and the nuclear isotope lights up the arteries so that the nuclear images are clear. The isotope is absorbed by your body. This results in low radiation exposure.  A resting nuclear image is taken to show how your heart functions at rest.  A medicine is given through the IV access.  A second scan is done about 1 hour after the medicine injection and determines how your heart functions under stress.  During this stress phase, you will be connected to an electrocardiogram machine. Your blood pressure and oxygen levels will be monitored. What to expect after the procedure  Your heart rate and blood pressure will be monitored after the test.  You may return to your normal schedule, including diet,activities, and medicines, unless your health care provider tells you otherwise. This information is not intended to replace advice given to you by your health care provider. Make sure you discuss any questions you have with your health care provider. Document Released: 04/22/2009 Document Revised: 05/11/2016 Document Reviewed: 06/12/2016 Elsevier Interactive Patient Education  2017 [employer].  If you need a refill on your cardiac medications before your next appointment, please call your pharmacy.

## 2017-02-19 NOTE — Progress Notes (Signed)
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 expect after the procedure  Your heart rate and blood pressure will be monitored after the test.  You may return to your normal schedule, including diet,activities, and medicines, unless your health care provider tells you otherwise. This information is not intended to replace advice given to you by your health care provider. Make sure you discuss any questions you have with your health care provider. Document Released: 04/22/2009 Document Revised: 05/11/2016 Document Reviewed: 06/12/2016 Elsevier Interactive Patient Education  2017 [employer].  If you need a refill on your cardiac medications before your next appointment, please call your pharmacy.      Signed, Ermalinda Barrios, PA-C  02/19/2017 2:18 PM    [facility] [address] [place] [place],  [place], [place]  [address] Phone: [phone]; Fax: [phone]

## 2017-02-21 NOTE — Progress Notes (Signed)
Urine drug screen for this encounter is consistent for prescribed medication 

## 2017-02-26 ENCOUNTER — Telehealth (HOSPITAL_COMMUNITY): Payer: Self-pay | Admitting: *Deleted
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
## 2017-02-26 NOTE — Telephone Encounter (Signed)
Left message on voicemail in reference to upcoming appointment scheduled for 02/28/17. Phone number given for a call back so details instructions can be given. Maria Griffith, [name]

## 2017-02-27 ENCOUNTER — Encounter: Payer: Worker's Compensation | Attending: Registered Nurse | Admitting: Registered Nurse

## 2017-02-27 ENCOUNTER — Encounter: Payer: Self-pay | Admitting: Registered Nurse

## 2017-02-27 ENCOUNTER — Telehealth: Payer: Self-pay | Admitting: Cardiovascular Disease

## 2017-02-27 ENCOUNTER — Telehealth (HOSPITAL_COMMUNITY): Payer: Self-pay

## 2017-02-27 VITALS — BP 159/103 | HR 75

## 2017-02-27 DIAGNOSIS — K5900 Constipation, unspecified: Secondary | ICD-10-CM | POA: Diagnosis not present

## 2017-02-27 DIAGNOSIS — G4700 Insomnia, unspecified: Secondary | ICD-10-CM | POA: Insufficient documentation

## 2017-02-27 DIAGNOSIS — M791 Myalgia: Secondary | ICD-10-CM | POA: Insufficient documentation

## 2017-02-27 DIAGNOSIS — M5412 Radiculopathy, cervical region: Secondary | ICD-10-CM | POA: Diagnosis not present

## 2017-02-27 DIAGNOSIS — R531 Weakness: Secondary | ICD-10-CM | POA: Insufficient documentation

## 2017-02-27 DIAGNOSIS — F419 Anxiety disorder, unspecified: Secondary | ICD-10-CM | POA: Diagnosis not present

## 2017-02-27 DIAGNOSIS — M545 Low back pain: Secondary | ICD-10-CM | POA: Diagnosis present

## 2017-02-27 DIAGNOSIS — Z760 Encounter for issue of repeat prescription: Secondary | ICD-10-CM | POA: Insufficient documentation

## 2017-02-27 DIAGNOSIS — M542 Cervicalgia: Secondary | ICD-10-CM | POA: Diagnosis not present

## 2017-02-27 DIAGNOSIS — G249 Dystonia, unspecified: Secondary | ICD-10-CM | POA: Diagnosis not present

## 2017-02-27 DIAGNOSIS — G894 Chronic pain syndrome: Secondary | ICD-10-CM

## 2017-02-27 DIAGNOSIS — M62838 Other muscle spasm: Secondary | ICD-10-CM | POA: Insufficient documentation

## 2017-02-27 DIAGNOSIS — M7918 Myalgia, other site: Secondary | ICD-10-CM

## 2017-02-27 DIAGNOSIS — Z5181 Encounter for therapeutic drug level monitoring: Secondary | ICD-10-CM | POA: Diagnosis not present

## 2017-02-27 DIAGNOSIS — Z79899 Other long term (current) drug therapy: Secondary | ICD-10-CM

## 2017-02-27 DIAGNOSIS — G8928 Other chronic postprocedural pain: Secondary | ICD-10-CM | POA: Diagnosis not present

## 2017-02-27 DIAGNOSIS — M4722 Other spondylosis with radiculopathy, cervical region: Secondary | ICD-10-CM

## 2017-02-27 DIAGNOSIS — E876 Hypokalemia: Secondary | ICD-10-CM | POA: Diagnosis not present

## 2017-02-27 DIAGNOSIS — J45909 Unspecified asthma, uncomplicated: Secondary | ICD-10-CM | POA: Diagnosis not present

## 2017-02-27 DIAGNOSIS — M961 Postlaminectomy syndrome, not elsewhere classified: Secondary | ICD-10-CM | POA: Diagnosis not present

## 2017-02-27 DIAGNOSIS — I10 Essential (primary) hypertension: Secondary | ICD-10-CM | POA: Diagnosis not present

## 2017-02-27 MED ORDER — TIZANIDINE HCL 4 MG PO TABS
4.0000 mg | ORAL_TABLET | Freq: Three times a day (TID) | ORAL | 2 refills | Status: DC
Start: 2017-02-27 — End: 2017-04-30

## 2017-02-27 MED ORDER — HYDROCODONE-ACETAMINOPHEN 7.5-325 MG PO TABS: 1.0000 | ORAL_TABLET | Freq: Four times a day (QID) | ORAL | 0 refills | Status: DC | PRN

## 2017-02-27 MED ORDER — TRAZODONE HCL 150 MG PO TABS: 150.0000 mg | ORAL_TABLET | Freq: Every day | ORAL | 2 refills | Status: DC

## 2017-02-27 NOTE — Telephone Encounter (Signed)
Agree with Dr Hassell Done plan.

## 2017-02-27 NOTE — Telephone Encounter (Signed)
New message    NPA calling patient in the office now - can patient be seen today   Pt c/o BP issue: STAT if pt c/o blurred vision, one-sided weakness or slurred speech  1. What are your last 5 BP readings? In the office  Arrival 11:13 163/106, retake @  11:18 am 178/115 , retake @ 11:20 am  166/116,   2. Are you having any other symptoms (ex. Dizziness, headache, blurred vision, passed out)? No   3. What is your BP issue? lisinopril (PRINIVIL,ZESTRIL) 10 MG tablet. Previous medication was cancel

## 2017-02-27 NOTE — Patient Instructions (Addendum)
Placed a call to [facility] Care Regarding your High Blood Pressure Today:  Dr. Irish Lack instructed [place] RN for you to take an extra Lisinopril 10 mg Today ONLY!!!!!!!!!!  Keep your appointment with [facility] tomorrow, Hypertension Clinic   Encourage Ms. Renato Battles to Purchase a blood Pressure Cuff, she verbalizes understanding
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
## 2017-02-27 NOTE — Telephone Encounter (Signed)
NP calling from physical medicine stating that patient's BPs are high (see note below). Most recent BP 159/103. She states that the patient denies any symptoms. The patient is scheduled to be seen in the HTN clinic tomorrow morning. Discussed with Dr. Irish Lack (DOD) and he advised patient to take an extra 10 mg dose of lisinopril today for a total of 20 mg today and keep appointment tomorrow.

## 2017-02-28 ENCOUNTER — Ambulatory Visit (INDEPENDENT_AMBULATORY_CARE_PROVIDER_SITE_OTHER): Payer: Medicare Other | Admitting: Pharmacist

## 2017-02-28 ENCOUNTER — Ambulatory Visit (HOSPITAL_COMMUNITY): Payer: Medicare Other | Attending: Cardiovascular Disease

## 2017-02-28 VITALS — BP 142/90 | HR 80

## 2017-02-28 VITALS — Ht 62.0 in | Wt 192.0 lb

## 2017-02-28 DIAGNOSIS — R079 Chest pain, unspecified: Secondary | ICD-10-CM

## 2017-02-28 DIAGNOSIS — I10 Essential (primary) hypertension: Secondary | ICD-10-CM

## 2017-02-28 LAB — MYOCARDIAL PERFUSION IMAGING
CHL CUP NUCLEAR SDS: 0
CHL CUP NUCLEAR SRS: 0
CHL CUP NUCLEAR SSS: 0
CSEPPHR: 96 {beats}/min
LV dias vol: 114 mL (ref 46–106)
LV sys vol: 54 mL
RATE: 0.34
Rest HR: 70 {beats}/min
TID: 0.96

## 2017-02-28 MED ORDER — TECHNETIUM TC 99M TETROFOSMIN IV KIT
30.0000 | PACK | Freq: Once | INTRAVENOUS | Status: AC | PRN
  Administered 2017-02-28: 30 via INTRAVENOUS
  Filled 2017-02-28: qty 30

## 2017-02-28 MED ORDER — AMINOPHYLLINE 25 MG/ML IV SOLN
75.0000 mg | Freq: Once | INTRAVENOUS | Status: AC
  Administered 2017-02-28: 75 mg via INTRAVENOUS

## 2017-02-28 MED ORDER — TECHNETIUM TC 99M TETROFOSMIN IV KIT
11.0000 | PACK | Freq: Once | INTRAVENOUS | Status: AC | PRN
  Administered 2017-02-28: 11 via INTRAVENOUS
  Filled 2017-02-28: qty 11

## 2017-02-28 MED ORDER — REGADENOSON 0.4 MG/5ML IV SOLN
0.4000 mg | Freq: Once | INTRAVENOUS | Status: AC
  Administered 2017-02-28: 0.4 mg via INTRAVENOUS

## 2017-02-28 MED ORDER — HYDROCHLOROTHIAZIDE 25 MG PO TABS: 25.0000 mg | ORAL_TABLET | Freq: Every day | ORAL | 3 refills | Status: DC

## 2017-02-28 NOTE — Patient Instructions (Addendum)
Return for a a follow up appointment in 3-4 weeks  Check your blood pressure at home daily (if able) and keep record of the readings.  Take your BP meds as follows: CONTINUE lisinopril 10 mg daily  RESTART hydrochlorothiazide 25mg  daily  Bring all of your meds, your BP cuff and your record of home blood pressures to your next appointment.  Exercise as you're able, try to walk approximately 30 minutes per day.  Keep salt intake to a minimum, especially watch canned and prepared boxed foods.  Eat more fresh fruits and vegetables and fewer canned items.  Avoid eating in fast food restaurants.    HOW TO TAKE YOUR BLOOD PRESSURE: . Rest 5 minutes before taking your blood pressure. .  Don't smoke or drink caffeinated beverages for at least 30 minutes before. . Take your blood pressure before (not after) you eat. . Sit comfortably with your back supported and both feet on the floor (don't cross your legs). . Elevate your arm to heart level on a table or a desk. . Use the proper sized cuff. It should fit smoothly and snugly around your bare upper arm. There should be enough room to slip a fingertip under the cuff. The bottom edge of the cuff should be 1 inch above the crease of the elbow. . Ideally, take 3 measurements at one sitting and record the average.   DASH Eating Plan DASH stands for "Dietary Approaches to Stop Hypertension." The DASH eating plan is a healthy eating plan that has been shown to reduce high blood pressure (hypertension). It may also reduce your risk for type 2 diabetes, heart disease, and stroke. The DASH eating plan may also help with weight loss. What are tips for following this plan? General guidelines   Avoid eating more than 2,300 mg (milligrams) of salt (sodium) a day. If you have hypertension, you may need to reduce your sodium intake to 1,500 mg a day.  Limit alcohol intake to no more than 1 drink a day for nonpregnant women and 2 drinks a day for men. One drink  equals 12 oz of beer, 5 oz of wine, or 1 oz of hard liquor.  Work with your health care provider to maintain a healthy body weight or to lose weight. Ask what an ideal weight is for you.  Get at least 30 minutes of exercise that causes your heart to beat faster (aerobic exercise) most days of the week. Activities may include walking, swimming, or biking.  Work with your health care provider or diet and nutrition specialist (dietitian) to adjust your eating plan to your individual calorie needs. Reading food labels   Check food labels for the amount of sodium per serving. Choose foods with less than 5 percent of the Daily Value of sodium. Generally, foods with less than 300 mg of sodium per serving fit into this eating plan.  To find whole grains, look for the word "whole" as the first word in the ingredient list. Shopping   Buy products labeled as "low-sodium" or "no salt added."  Buy fresh foods. Avoid canned foods and premade or frozen meals. Cooking   Avoid adding salt when cooking. Use salt-free seasonings or herbs instead of table salt or sea salt. Check with your health care provider or pharmacist before using salt substitutes.  Do not fry foods. Cook foods using healthy methods such as baking, boiling, grilling, and broiling instead.  Cook with heart-healthy oils, such as olive, canola, soybean, or sunflower oil. Meal  planning    Eat a balanced diet that includes:  5 or more servings of fruits and vegetables each day. At each meal, try to fill half of your plate with fruits and vegetables.  Up to 6-8 servings of whole grains each day.  Less than 6 oz of lean meat, poultry, or fish each day. A 3-oz serving of meat is about the same size as a deck of cards. One egg equals 1 oz.  2 servings of low-fat dairy each day.  A serving of nuts, seeds, or beans 5 times each week.  Heart-healthy fats. Healthy fats called Omega-3 fatty acids are found in foods such as flaxseeds and  coldwater fish, like sardines, salmon, and mackerel.  Limit how much you eat of the following:  Canned or prepackaged foods.  Food that is high in trans fat, such as fried foods.  Food that is high in saturated fat, such as fatty meat.  Sweets, desserts, sugary drinks, and other foods with added sugar.  Full-fat dairy products.  Do not salt foods before eating.  Try to eat at least 2 vegetarian meals each week.  Eat more home-cooked food and less restaurant, buffet, and fast food.  When eating at a restaurant, ask that your food be prepared with less salt or no salt, if possible. What foods are recommended? The items listed may not be a complete list. Talk with your dietitian about what dietary choices are best for you. Grains  Whole-grain or whole-wheat bread. Whole-grain or whole-wheat pasta. Brown rice. [place]. Bulgur. Whole-grain and low-sodium cereals. Pita bread. Low-fat, low-sodium crackers. Whole-wheat flour tortillas. Vegetables  Fresh or frozen vegetables (raw, steamed, roasted, or grilled). Low-sodium or reduced-sodium tomato and vegetable juice. Low-sodium or reduced-sodium tomato sauce and tomato paste. Low-sodium or reduced-sodium canned vegetables. Fruits  All fresh, dried, or frozen fruit. Canned fruit in natural juice (without added sugar). Meat and other protein foods  Skinless chicken or [place]. Ground chicken or [place]. Pork with fat trimmed off. Fish and seafood. Egg whites. Dried beans, peas, or lentils. Unsalted nuts, nut butters, and seeds. Unsalted canned beans. Lean cuts of beef with fat trimmed off. Low-sodium, lean deli meat. Dairy  Low-fat (1%) or fat-free (skim) milk. Fat-free, low-fat, or reduced-fat cheeses. Nonfat, low-sodium ricotta or cottage cheese. Low-fat or nonfat yogurt. Low-fat, low-sodium cheese. Fats and oils  Soft margarine without trans fats. Vegetable oil. Low-fat, reduced-fat, or light mayonnaise and salad dressings  (reduced-sodium). Canola, safflower, olive, soybean, and sunflower oils. Avocado. Seasoning and other foods  Herbs. Spices. Seasoning mixes without salt. Unsalted popcorn and pretzels. Fat-free sweets. What foods are not recommended? The items listed may not be a complete list. Talk with your dietitian about what dietary choices are best for you. Grains  Baked goods made with fat, such as croissants, muffins, or some breads. Dry pasta or rice meal packs. Vegetables  Creamed or fried vegetables. Vegetables in a cheese sauce. Regular canned vegetables (not low-sodium or reduced-sodium). Regular canned tomato sauce and paste (not low-sodium or reduced-sodium). Regular tomato and vegetable juice (not low-sodium or reduced-sodium). Angie Fava. Olives. Fruits  Canned fruit in a light or heavy syrup. Fried fruit. Fruit in cream or butter sauce. Meat and other protein foods  Fatty cuts of meat. Ribs. Fried meat. Berniece Salines. Sausage. Bologna and other processed lunch meats. Salami. Fatback. Hotdogs. Bratwurst. Salted nuts and seeds. Canned beans with added salt. Canned or smoked fish. Whole eggs or egg yolks. Chicken or [place] with skin. Dairy  Whole or 2% milk, cream, and half-and-half. Whole or full-fat cream cheese. Whole-fat or sweetened yogurt. Full-fat cheese. Nondairy creamers. Whipped toppings. Processed cheese and cheese spreads. Fats and oils  Butter. Stick margarine. Lard. Shortening. Ghee. Bacon fat. Tropical oils, such as coconut, palm kernel, or palm oil. Seasoning and other foods  Salted popcorn and pretzels. Onion salt, garlic salt, seasoned salt, table salt, and sea salt. Worcestershire sauce. Tartar sauce. Barbecue sauce. Teriyaki sauce. Soy sauce, including reduced-sodium. Steak sauce. Canned and packaged gravies. Fish sauce. Oyster sauce. Cocktail sauce. Horseradish that you find on the shelf. Ketchup. Mustard. Meat flavorings and tenderizers. Bouillon cubes. Hot sauce and Tabasco sauce. Premade  or packaged marinades. Premade or packaged taco seasonings. Relishes. Regular salad dressings. Where to find more information:  National Heart, Lung, and [place]: https://wilson-eaton.com/  American Heart Association: www.heart.org Summary  The DASH eating plan is a healthy eating plan that has been shown to reduce high blood pressure (hypertension). It may also reduce your risk for type 2 diabetes, heart disease, and stroke.  With the DASH eating plan, you should limit salt (sodium) intake to 2,300 mg a day. If you have hypertension, you may need to reduce your sodium intake to 1,500 mg a day.  When on the DASH eating plan, aim to eat more fresh fruits and vegetables, whole grains, lean proteins, low-fat dairy, and heart-healthy fats.  Work with your health care provider or diet and nutrition specialist (dietitian) to adjust your eating plan to your individual calorie needs. This information is not intended to replace advice given to you by your health care provider. Make sure you discuss any questions you have with your health care provider. Document Released: 11/23/2011 Document Revised: 11/27/2016 Document Reviewed: 11/27/2016 Elsevier Interactive Patient Education  2017 [employer].

## 2017-03-01 NOTE — Telephone Encounter (Signed)
Follow Up: ° ° ° °Returning your call from yesterday. °

## 2017-03-05 ENCOUNTER — Other Ambulatory Visit: Payer: Self-pay | Admitting: Physical Medicine & Rehabilitation

## 2017-03-08 ENCOUNTER — Ambulatory Visit: Payer: Self-pay

## 2017-03-11 ENCOUNTER — Other Ambulatory Visit: Payer: Self-pay

## 2017-03-11 ENCOUNTER — Emergency Department (HOSPITAL_COMMUNITY): Payer: Medicare Other

## 2017-03-11 ENCOUNTER — Encounter (HOSPITAL_COMMUNITY): Payer: Self-pay

## 2017-03-11 ENCOUNTER — Emergency Department (HOSPITAL_COMMUNITY)
Admission: EM | Admit: 2017-03-11 | Discharge: 2017-03-11 | Disposition: A | Discharge status: Home / Self Care | Payer: Medicare Other | Attending: Emergency Medicine | Admitting: Emergency Medicine

## 2017-03-11 DIAGNOSIS — I10 Essential (primary) hypertension: Secondary | ICD-10-CM | POA: Insufficient documentation

## 2017-03-11 DIAGNOSIS — R55 Syncope and collapse: Secondary | ICD-10-CM | POA: Diagnosis not present

## 2017-03-11 DIAGNOSIS — R51 Headache: Secondary | ICD-10-CM | POA: Diagnosis not present

## 2017-03-11 DIAGNOSIS — S199XXA Unspecified injury of neck, initial encounter: Secondary | ICD-10-CM | POA: Diagnosis not present

## 2017-03-11 DIAGNOSIS — J45909 Unspecified asthma, uncomplicated: Secondary | ICD-10-CM | POA: Insufficient documentation

## 2017-03-11 DIAGNOSIS — R404 Transient alteration of awareness: Secondary | ICD-10-CM | POA: Diagnosis not present

## 2017-03-11 DIAGNOSIS — D709 Neutropenia, unspecified: Secondary | ICD-10-CM | POA: Diagnosis not present

## 2017-03-11 DIAGNOSIS — Z79899 Other long term (current) drug therapy: Secondary | ICD-10-CM | POA: Insufficient documentation

## 2017-03-11 LAB — CBC WITH DIFFERENTIAL/PLATELET
BASOS PCT: 1 %
Basophils Absolute: 0 10*3/uL (ref 0.0–0.1)
EOS ABS: 0.1 10*3/uL (ref 0.0–0.7)
Eosinophils Relative: 4 %
HCT: 36.7 % (ref 36.0–46.0)
HEMOGLOBIN: 12.2 g/dL (ref 12.0–15.0)
LYMPHS PCT: 49 %
Lymphs Abs: 1.4 10*3/uL (ref 0.7–4.0)
MCH: 28.2 pg (ref 26.0–34.0)
MCHC: 33.2 g/dL (ref 30.0–36.0)
MCV: 85 fL (ref 78.0–100.0)
MONO ABS: 0.3 10*3/uL (ref 0.1–1.0)
Monocytes Relative: 12 %
NEUTROS ABS: 0.9 10*3/uL — AB (ref 1.7–7.7)
Neutrophils Relative %: 34 %
PLATELETS: 225 10*3/uL (ref 150–400)
RBC: 4.32 MIL/uL (ref 3.87–5.11)
RDW: 13.7 % (ref 11.5–15.5)
WBC: 2.7 10*3/uL — ABNORMAL LOW (ref 4.0–10.5)

## 2017-03-11 LAB — BASIC METABOLIC PANEL
Anion gap: 9 (ref 5–15)
BUN: 14 mg/dL (ref 6–20)
CHLORIDE: 100 mmol/L — AB (ref 101–111)
CO2: 30 mmol/L (ref 22–32)
CREATININE: 1.02 mg/dL — AB (ref 0.44–1.00)
Calcium: 9.4 mg/dL (ref 8.9–10.3)
GFR calc Af Amer: >60 mL/min (ref >60)
GFR calc non Af Amer: >60 mL/min (ref >60)
GLUCOSE: 102 mg/dL — AB (ref 65–99)
POTASSIUM: 3.8 mmol/L (ref 3.5–5.1)
SODIUM: 139 mmol/L (ref 135–145)

## 2017-03-11 MED ORDER — SODIUM CHLORIDE 0.9 % IV SOLN: INTRAVENOUS | Status: DC

## 2017-03-11 MED ORDER — SODIUM CHLORIDE 0.9 % IV BOLUS (SEPSIS)
500.0000 mL | Freq: Once | INTRAVENOUS | Status: AC
  Administered 2017-03-11: 500 mL via INTRAVENOUS

## 2017-03-11 MED ORDER — SODIUM CHLORIDE 0.9 % IV BOLUS (SEPSIS): 1000.0000 mL | Freq: Once | INTRAVENOUS | Status: DC

## 2017-03-11 MED ORDER — DEXTROSE-NACL 5-0.45 % IV SOLN: INTRAVENOUS | Status: DC

## 2017-03-11 MED ORDER — HYDROCODONE-ACETAMINOPHEN 5-325 MG PO TABS
2.0000 | ORAL_TABLET | Freq: Once | ORAL | Status: AC
  Administered 2017-03-11: 2 via ORAL
  Filled 2017-03-11: qty 2
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
## 2017-03-11 NOTE — ED Triage Notes (Signed)
Pt to ER by [facility] from church where patient had a witnessed syncopal event, fall witnessed. C-collar in place on arrival. Pt is a/o x4 per EMS. Hx of hypertension and asthma. BP 160/108, HR 80, CBG 96.

## 2017-03-11 NOTE — ED Notes (Signed)
Pt ambulated in hallway with personal cane. Tolerated moderately well. Pt had to stop once stating she felt dizzy. Pt continued on and was able to get back into bed with stand-by assist. Pt stated she felt very tired after the walk. Pt ambulated 73ft. approx.

## 2017-03-11 NOTE — Discharge Instructions (Signed)
Please read and follow all provided instructions.  Your diagnoses today include:  1. Syncope, unspecified syncope type     Tests performed today include:  CT scan of your head and neck that did not show any serious injury.  Blood counts and electrolytes - normal  EKG - normal  Vital signs. See below for your results today.   Medications prescribed:   None  Take any prescribed medications only as directed.  Home care instructions:  Follow any educational materials contained in this packet.  Follow-up instructions: Please follow-up with your primary care provider in the next 3 days for further evaluation of your symptoms.   Return instructions:  SEEK IMMEDIATE MEDICAL ATTENTION IF:  There is confusion or drowsiness (although children frequently become drowsy after injury).   You cannot awaken the injured person.   You have more than one episode of vomiting.   You notice worsening dizziness or unsteadiness which is getting worse, or inability to walk.   You have convulsions or unconsciousness.   You experience severe, persistent headaches not relieved by Tylenol.  You cannot use arms or legs normally.   There are changes in pupil sizes. (This is the black center in the colored part of the eye)   There is clear or bloody discharge from the nose or ears.   You have change in speech, vision, swallowing, or understanding.   Localized weakness, numbness, tingling, or change in bowel or bladder control.  You have any other emergent concerns.  Your vital signs today were: BP (!) 176/102    Pulse 69    Temp 98.2 F (36.8 C) (Oral)    Resp 13    Ht 5\' 3"  (1.6 m)    Wt 90.7 kg    LMP 10/22/2014 (Approximate)    SpO2 100%    BMI 35.43 kg/m  If your blood pressure (BP) was elevated above 135/85 this visit, please have this repeated by your doctor within one month. --------------

## 2017-03-11 NOTE — ED Notes (Signed)
Pt unable to complete orthostatic vitals. When Pt sat up she stated her head and shoulder hurt to much. Aching and throbing headache. Pt stated she needed to lay back down. Pt laid back down and is resting comfortably

## 2017-03-11 NOTE — ED Notes (Signed)
All jewelry removed given to daughter

## 2017-03-11 NOTE — ED Notes (Signed)
Pt states she understands instrfuctions . Home wstgab le with family via wc.

## 2017-03-12 LAB — PATHOLOGIST SMEAR REVIEW
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
## 2017-03-23 ENCOUNTER — Encounter: Payer: Self-pay | Admitting: *Deleted

## 2017-03-26 ENCOUNTER — Encounter: Payer: Self-pay | Admitting: Pharmacist

## 2017-03-26 ENCOUNTER — Other Ambulatory Visit: Payer: Medicare Other

## 2017-03-26 ENCOUNTER — Ambulatory Visit (INDEPENDENT_AMBULATORY_CARE_PROVIDER_SITE_OTHER): Payer: Medicare Other | Admitting: Pharmacist

## 2017-03-26 VITALS — BP 172/104 | HR 87 | Wt 191.0 lb

## 2017-03-26 DIAGNOSIS — I10 Essential (primary) hypertension: Secondary | ICD-10-CM | POA: Diagnosis not present

## 2017-03-26 MED ORDER — LISINOPRIL 40 MG PO TABS: 40.0000 mg | ORAL_TABLET | Freq: Every day | ORAL | 3 refills | Status: DC

## 2017-03-26 NOTE — Patient Instructions (Addendum)
Return for a follow up appointment in 3-4 weeks  Check your blood pressure at home daily (if able) and keep record of the readings.  Take your BP meds as follows: INCREASE lisinopril to 40mg  daily (if you wish to use your current supply you may take 4 tablets daily until completed - then start higher dose from pharmacy at 1 tablet per day) Continue HCTZ 25mg  daily   Bring all of your meds, your BP cuff and your record of home blood pressures to your next appointment.  Exercise as you're able, try to walk approximately 30 minutes per day.  Keep salt intake to a minimum, especially watch canned and prepared boxed foods.  Eat more fresh fruits and vegetables and fewer canned items.  Avoid eating in fast food restaurants.    HOW TO TAKE YOUR BLOOD PRESSURE: . Rest 5 minutes before taking your blood pressure. .  Don't smoke or drink caffeinated beverages for at least 30 minutes before. . Take your blood pressure before (not after) you eat. . Sit comfortably with your back supported and both feet on the floor (don't cross your legs). . Elevate your arm to heart level on a table or a desk. . Use the proper sized cuff. It should fit smoothly and snugly around your bare upper arm. There should be enough room to slip a fingertip under the cuff. The bottom edge of the cuff should be 1 inch above the crease of the elbow. . Ideally, take 3 measurements at one sitting and record the average.

## 2017-03-27 ENCOUNTER — Telehealth: Payer: Self-pay

## 2017-03-27 LAB — BASIC METABOLIC PANEL WITH GFR
BUN/Creatinine Ratio: 22 (ref 9–23)
BUN: 19 mg/dL (ref 6–24)
CO2: 27 mmol/L (ref 18–29)
Calcium: 9 mg/dL (ref 8.7–10.2)
Chloride: 98 mmol/L (ref 96–106)
Creatinine, Ser: 0.87 mg/dL (ref 0.57–1.00)
GFR calc Af Amer: 87 mL/min/1.73
GFR calc non Af Amer: 76 mL/min/1.73
Glucose: 97 mg/dL (ref 65–99)
Potassium: 3 mmol/L — ABNORMAL LOW (ref 3.5–5.2)
Sodium: 143 mmol/L (ref 134–144)

## 2017-03-27 MED ORDER — POTASSIUM CHLORIDE CRYS ER 20 MEQ PO TBCR: 40.0000 meq | EXTENDED_RELEASE_TABLET | Freq: Every day | ORAL | 3 refills | Status: DC

## 2017-03-27 NOTE — Telephone Encounter (Signed)
Left message letting patient know that she was fine to have BMET on day of her next appointment.

## 2017-03-27 NOTE — Telephone Encounter (Signed)
Called patient about her lab results. Per Dr. Angelena Form, renal function is stable after recent increase in Lisinopril. Potassium is lower. Can we send in [place] 40 meq daily and have her take 80 meq today, then 40 meq daily? Repeat BMET in one week. Patient will start Kdur, but stated she could not come back in a week. Patient asked if she could get repeat lab work on her office visit on 04/11/17. Will forward to Dr. Angelena Form.

## 2017-03-27 NOTE — Telephone Encounter (Signed)
-----   Message from Burnell Blanks, MD sent at 03/27/2017 10:47 AM EDT ----- Renal function is stable after recent increase in Lisinopril. Potassium is lower. Can we send in [place] 40 meq daily and have her take 80 meq today, then 40 meq daily? Repeat BMET in one week. Thanks, chris

## 2017-03-27 NOTE — Telephone Encounter (Signed)
That is ok.    Thanks.

## 2017-03-29 ENCOUNTER — Ambulatory Visit: Payer: Medicare Other

## 2017-03-30 ENCOUNTER — Ambulatory Visit: Payer: Self-pay | Admitting: Physical Medicine & Rehabilitation

## 2017-04-02 ENCOUNTER — Ambulatory Visit (HOSPITAL_BASED_OUTPATIENT_CLINIC_OR_DEPARTMENT_OTHER): Payer: Worker's Compensation | Admitting: Physical Medicine & Rehabilitation

## 2017-04-02 ENCOUNTER — Encounter: Payer: Self-pay | Admitting: Physical Medicine & Rehabilitation

## 2017-04-02 ENCOUNTER — Encounter: Payer: Worker's Compensation | Attending: Physical Medicine & Rehabilitation

## 2017-04-02 ENCOUNTER — Other Ambulatory Visit: Payer: Self-pay | Admitting: Family Medicine

## 2017-04-02 ENCOUNTER — Telehealth: Payer: Self-pay | Admitting: Family Medicine

## 2017-04-02 ENCOUNTER — Other Ambulatory Visit: Payer: Self-pay | Admitting: Registered Nurse

## 2017-04-02 VITALS — BP 177/99 | HR 72 | Resp 14

## 2017-04-02 DIAGNOSIS — G243 Spasmodic torticollis: Secondary | ICD-10-CM | POA: Insufficient documentation

## 2017-04-02 MED ORDER — HYDROCODONE-ACETAMINOPHEN 7.5-325 MG PO TABS: 1.0000 | ORAL_TABLET | Freq: Four times a day (QID) | ORAL | 0 refills | Status: DC | PRN

## 2017-04-02 NOTE — Telephone Encounter (Signed)
Will forward to MD. Maria Griffith,CMA  

## 2017-04-02 NOTE — Patient Instructions (Signed)

## 2017-04-02 NOTE — Progress Notes (Signed)
   Procedure: Dysport injection Diagnosis: Cervical dystonia G 24.3 Dilution: 500 units in 1 mL sterile preservative-free normal saline  Informed consent was obtained after describing risks and benefits of the procedure with patient including bleeding bruising infection as well as the potential side effects of the medication itself. Patient elects to proceed and has given written consent.  Patient placed in a seated position Areas were marked and prepped with Betadine  Needle: 27-gauge 1 inch needle electrode connected to EMG amplifier  Right trapezius: 50 units Left trapezius: 100 units Left longissimus: 100 units Left splenius capitis: 150 units Left levator scapula: 100 units  All injections done after negative drawback for blood. Appropriate EMG activity.  Prescription for Aquatic therapy 2-3 times a week at [facility] for OT driving eval

## 2017-04-02 NOTE — Telephone Encounter (Signed)
I discussed her PAP result again with positive HPV. She stated she is yet to get Colpo done. Result explained to her as well as the procedure. She has an appointment for 04/05/17 at the [facility].

## 2017-04-02 NOTE — Telephone Encounter (Signed)
Pt would like PCP to call her, regarding recent diagnosis. ep

## 2017-04-05 ENCOUNTER — Encounter: Payer: Self-pay | Admitting: Family Medicine

## 2017-04-05 ENCOUNTER — Ambulatory Visit (INDEPENDENT_AMBULATORY_CARE_PROVIDER_SITE_OTHER): Payer: Medicare Other | Admitting: Family Medicine

## 2017-04-05 VITALS — BP 148/90 | HR 77 | Temp 98.2°F | Wt 193.2 lb

## 2017-04-05 DIAGNOSIS — N898 Other specified noninflammatory disorders of vagina: Secondary | ICD-10-CM

## 2017-04-05 DIAGNOSIS — R87619 Unspecified abnormal cytological findings in specimens from cervix uteri: Secondary | ICD-10-CM | POA: Diagnosis not present

## 2017-04-05 DIAGNOSIS — R8789 Other abnormal findings in specimens from female genital organs: Secondary | ICD-10-CM | POA: Diagnosis not present

## 2017-04-05 DIAGNOSIS — R87613 High grade squamous intraepithelial lesion on cytologic smear of cervix (HGSIL): Secondary | ICD-10-CM | POA: Diagnosis not present

## 2017-04-05 DIAGNOSIS — R87618 Other abnormal cytological findings on specimens from cervix uteri: Secondary | ICD-10-CM

## 2017-04-05 LAB — POCT WET PREP (WET MOUNT)
Clue Cells Wet Prep Whiff POC: POSITIVE
Trichomonas Wet Prep HPF POC: ABSENT

## 2017-04-05 MED ORDER — METRONIDAZOLE 500 MG PO TABS: 500.0000 mg | ORAL_TABLET | Freq: Two times a day (BID) | ORAL | 0 refills | Status: AC
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
## 2017-04-05 NOTE — Patient Instructions (Signed)
Your colposcopy looks fine, no need for biopsy. You will need a pap smear in 1 year. Please follow up for your blood pressure in about two weeks

## 2017-04-11 ENCOUNTER — Other Ambulatory Visit: Payer: Medicare Other

## 2017-04-11 ENCOUNTER — Ambulatory Visit (INDEPENDENT_AMBULATORY_CARE_PROVIDER_SITE_OTHER): Payer: Medicare Other | Admitting: Cardiovascular Disease

## 2017-04-11 ENCOUNTER — Encounter: Payer: Self-pay | Admitting: Cardiovascular Disease

## 2017-04-11 VITALS — BP 152/90 | HR 81 | Ht 61.0 in | Wt 195.4 lb

## 2017-04-11 DIAGNOSIS — I110 Hypertensive heart disease with heart failure: Secondary | ICD-10-CM

## 2017-04-11 DIAGNOSIS — I10 Essential (primary) hypertension: Secondary | ICD-10-CM | POA: Diagnosis not present

## 2017-04-11 DIAGNOSIS — I351 Nonrheumatic aortic (valve) insufficiency: Secondary | ICD-10-CM | POA: Diagnosis not present

## 2017-04-11 MED ORDER — CARVEDILOL 6.25 MG PO TABS: 6.2500 mg | ORAL_TABLET | Freq: Two times a day (BID) | ORAL | 3 refills | Status: DC

## 2017-04-11 NOTE — Patient Instructions (Signed)
Your physician has recommended you make the following change in your medication:  START CARVEDILOL 6.25 MG TWICE  DAILY  Your physician recommends that you schedule a follow-up appointment in: 2 -3 [facility]
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
## 2017-04-12 LAB — BASIC METABOLIC PANEL
BUN / CREAT RATIO: 20 (ref 9–23)
BUN: 19 mg/dL (ref 6–24)
CHLORIDE: 102 mmol/L (ref 96–106)
CO2: 28 mmol/L (ref 18–29)
Calcium: 9.2 mg/dL (ref 8.7–10.2)
Creatinine, Ser: 0.96 mg/dL (ref 0.57–1.00)
GFR calc Af Amer: 78 mL/min/{1.73_m2} (ref >59)
GFR, EST NON AFRICAN AMERICAN: 67 mL/min/{1.73_m2} (ref >59)
Glucose: 92 mg/dL (ref 65–99)
POTASSIUM: 3.7 mmol/L (ref 3.5–5.2)
Sodium: 143 mmol/L (ref 134–144)

## 2017-04-13 ENCOUNTER — Other Ambulatory Visit: Payer: Self-pay | Admitting: *Deleted

## 2017-04-13 MED ORDER — TRAZODONE HCL 150 MG PO TABS: 150.0000 mg | ORAL_TABLET | Freq: Every day | ORAL | 2 refills | Status: DC

## 2017-04-27 ENCOUNTER — Telehealth: Payer: Self-pay | Admitting: Registered Nurse

## 2017-04-27 NOTE — Telephone Encounter (Signed)
On 04/27/2017 the  [place] was reviewed no conflict was seen on the [place] with multiple prescribers. Maria Griffith has a signed narcotic contract with our office. If there were any discrepancies this would have been reported to her physician.

## 2017-04-30 ENCOUNTER — Encounter: Payer: Self-pay | Admitting: Registered Nurse

## 2017-04-30 ENCOUNTER — Encounter: Payer: Worker's Compensation | Attending: Registered Nurse | Admitting: Registered Nurse

## 2017-04-30 VITALS — BP 139/90 | HR 61

## 2017-04-30 DIAGNOSIS — M791 Myalgia: Secondary | ICD-10-CM | POA: Diagnosis not present

## 2017-04-30 DIAGNOSIS — I10 Essential (primary) hypertension: Secondary | ICD-10-CM | POA: Insufficient documentation

## 2017-04-30 DIAGNOSIS — M62838 Other muscle spasm: Secondary | ICD-10-CM | POA: Insufficient documentation

## 2017-04-30 DIAGNOSIS — M5412 Radiculopathy, cervical region: Secondary | ICD-10-CM

## 2017-04-30 DIAGNOSIS — M7918 Myalgia, other site: Secondary | ICD-10-CM

## 2017-04-30 DIAGNOSIS — Z79899 Other long term (current) drug therapy: Secondary | ICD-10-CM

## 2017-04-30 DIAGNOSIS — F329 Major depressive disorder, single episode, unspecified: Secondary | ICD-10-CM | POA: Diagnosis not present

## 2017-04-30 DIAGNOSIS — G8928 Other chronic postprocedural pain: Secondary | ICD-10-CM | POA: Insufficient documentation

## 2017-04-30 DIAGNOSIS — Z760 Encounter for issue of repeat prescription: Secondary | ICD-10-CM | POA: Insufficient documentation

## 2017-04-30 DIAGNOSIS — G249 Dystonia, unspecified: Secondary | ICD-10-CM | POA: Diagnosis not present

## 2017-04-30 DIAGNOSIS — K5900 Constipation, unspecified: Secondary | ICD-10-CM | POA: Insufficient documentation

## 2017-04-30 DIAGNOSIS — M961 Postlaminectomy syndrome, not elsewhere classified: Secondary | ICD-10-CM | POA: Diagnosis not present

## 2017-04-30 DIAGNOSIS — M4722 Other spondylosis with radiculopathy, cervical region: Secondary | ICD-10-CM

## 2017-04-30 DIAGNOSIS — G4700 Insomnia, unspecified: Secondary | ICD-10-CM | POA: Insufficient documentation

## 2017-04-30 DIAGNOSIS — Z5181 Encounter for therapeutic drug level monitoring: Secondary | ICD-10-CM | POA: Insufficient documentation

## 2017-04-30 DIAGNOSIS — F419 Anxiety disorder, unspecified: Secondary | ICD-10-CM | POA: Diagnosis not present

## 2017-04-30 DIAGNOSIS — J45909 Unspecified asthma, uncomplicated: Secondary | ICD-10-CM | POA: Diagnosis not present

## 2017-04-30 DIAGNOSIS — G894 Chronic pain syndrome: Secondary | ICD-10-CM

## 2017-04-30 MED ORDER — HYDROCODONE-ACETAMINOPHEN 7.5-325 MG PO TABS: 1.0000 | ORAL_TABLET | Freq: Four times a day (QID) | ORAL | 0 refills | Status: DC | PRN

## 2017-04-30 MED ORDER — PREGABALIN 100 MG PO CAPS
100.0000 mg | ORAL_CAPSULE | Freq: Three times a day (TID) | ORAL | 3 refills | Status: DC
Start: 2017-04-30 — End: 2017-07-30

## 2017-04-30 MED ORDER — DICLOFENAC SODIUM 1 % TD GEL: TRANSDERMAL | 2 refills | Status: DC
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
## 2017-05-02 ENCOUNTER — Ambulatory Visit: Payer: Medicare Other
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
## 2017-05-04 ENCOUNTER — Telehealth: Payer: Self-pay | Admitting: *Deleted

## 2017-05-04 LAB — TOXASSURE SELECT,+ANTIDEPR,UR

## 2017-05-04 NOTE — Telephone Encounter (Signed)
Urine drug screen for this encounter is consistent for prescribed medication 
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
## 2017-05-09 ENCOUNTER — Ambulatory Visit (INDEPENDENT_AMBULATORY_CARE_PROVIDER_SITE_OTHER): Payer: Medicare Other | Admitting: Pharmacist

## 2017-05-09 VITALS — BP 148/96 | HR 70 | Wt 193.2 lb

## 2017-05-09 DIAGNOSIS — I10 Essential (primary) hypertension: Secondary | ICD-10-CM | POA: Diagnosis not present

## 2017-05-09 MED ORDER — CARVEDILOL 6.25 MG PO TABS: 9.3750 mg | ORAL_TABLET | Freq: Two times a day (BID) | ORAL | 3 refills | Status: DC

## 2017-05-09 NOTE — Patient Instructions (Addendum)
Return for a follow up appointment in 3-4 weeks  Check your blood pressure at home daily (if able) and keep record of the readings.  Take your BP meds as follows: INCREASE carvedilol to 1.5 tablets TWICE DAILY   Bring all of your meds, your BP cuff and your record of home blood pressures to your next appointment.  Exercise as you're able, try to walk approximately 30 minutes per day.  Keep salt intake to a minimum, especially watch canned and prepared boxed foods.  Eat more fresh fruits and vegetables and fewer canned items.  Avoid eating in fast food restaurants.    HOW TO TAKE YOUR BLOOD PRESSURE: . Rest 5 minutes before taking your blood pressure. .  Don't smoke or drink caffeinated beverages for at least 30 minutes before. . Take your blood pressure before (not after) you eat. . Sit comfortably with your back supported and both feet on the floor (don't cross your legs). . Elevate your arm to heart level on a table or a desk. . Use the proper sized cuff. It should fit smoothly and snugly around your bare upper arm. There should be enough room to slip a fingertip under the cuff. The bottom edge of the cuff should be 1 inch above the crease of the elbow. . Ideally, take 3 measurements at one sitting and record the average.

## 2017-05-11 ENCOUNTER — Other Ambulatory Visit: Payer: Self-pay | Admitting: Registered Nurse

## 2017-05-15 ENCOUNTER — Other Ambulatory Visit: Payer: Self-pay | Admitting: *Deleted

## 2017-05-15 MED ORDER — FLUTICASONE-SALMETEROL 250-50 MCG/DOSE IN AEPB: 1.0000 | INHALATION_SPRAY | Freq: Two times a day (BID) | RESPIRATORY_TRACT | 2 refills | Status: DC

## 2017-05-28 ENCOUNTER — Encounter: Payer: Medicare Other | Admitting: Registered Nurse

## 2017-05-29 ENCOUNTER — Encounter: Payer: Medicare Other | Attending: Registered Nurse | Admitting: Registered Nurse

## 2017-05-29 ENCOUNTER — Encounter: Payer: Self-pay | Admitting: Registered Nurse

## 2017-05-29 VITALS — BP 196/117 | HR 72

## 2017-05-29 DIAGNOSIS — M961 Postlaminectomy syndrome, not elsewhere classified: Secondary | ICD-10-CM | POA: Diagnosis not present

## 2017-05-29 DIAGNOSIS — M62838 Other muscle spasm: Secondary | ICD-10-CM

## 2017-05-29 DIAGNOSIS — G894 Chronic pain syndrome: Secondary | ICD-10-CM

## 2017-05-29 DIAGNOSIS — G8928 Other chronic postprocedural pain: Secondary | ICD-10-CM | POA: Diagnosis not present

## 2017-05-29 DIAGNOSIS — K5900 Constipation, unspecified: Secondary | ICD-10-CM | POA: Diagnosis not present

## 2017-05-29 DIAGNOSIS — G249 Dystonia, unspecified: Secondary | ICD-10-CM | POA: Diagnosis not present

## 2017-05-29 DIAGNOSIS — Z760 Encounter for issue of repeat prescription: Secondary | ICD-10-CM | POA: Diagnosis not present

## 2017-05-29 DIAGNOSIS — I10 Essential (primary) hypertension: Secondary | ICD-10-CM | POA: Insufficient documentation

## 2017-05-29 DIAGNOSIS — M5442 Lumbago with sciatica, left side: Secondary | ICD-10-CM | POA: Diagnosis not present

## 2017-05-29 DIAGNOSIS — Z79899 Other long term (current) drug therapy: Secondary | ICD-10-CM | POA: Diagnosis not present

## 2017-05-29 DIAGNOSIS — M4722 Other spondylosis with radiculopathy, cervical region: Secondary | ICD-10-CM

## 2017-05-29 DIAGNOSIS — Z5181 Encounter for therapeutic drug level monitoring: Secondary | ICD-10-CM

## 2017-05-29 DIAGNOSIS — G4700 Insomnia, unspecified: Secondary | ICD-10-CM | POA: Insufficient documentation

## 2017-05-29 DIAGNOSIS — M791 Myalgia: Secondary | ICD-10-CM | POA: Diagnosis not present

## 2017-05-29 DIAGNOSIS — M7918 Myalgia, other site: Secondary | ICD-10-CM

## 2017-05-29 DIAGNOSIS — M5412 Radiculopathy, cervical region: Secondary | ICD-10-CM | POA: Insufficient documentation

## 2017-05-29 MED ORDER — HYDROCODONE-ACETAMINOPHEN 7.5-325 MG PO TABS: 1.0000 | ORAL_TABLET | Freq: Four times a day (QID) | ORAL | 0 refills | Status: DC | PRN

## 2017-05-29 MED ORDER — TIZANIDINE HCL 4 MG PO TABS: 4.0000 mg | ORAL_TABLET | Freq: Three times a day (TID) | ORAL | 2 refills | Status: DC

## 2017-05-29 MED ORDER — TRAZODONE HCL 150 MG PO TABS: 150.0000 mg | ORAL_TABLET | Freq: Every day | ORAL | 2 refills | Status: DC
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
## 2017-06-04 ENCOUNTER — Ambulatory Visit: Payer: Medicare Other

## 2017-06-04 ENCOUNTER — Ambulatory Visit (INDEPENDENT_AMBULATORY_CARE_PROVIDER_SITE_OTHER): Payer: Medicare Other | Admitting: Pharmacist

## 2017-06-04 VITALS — BP 112/70 | HR 75

## 2017-06-04 DIAGNOSIS — I10 Essential (primary) hypertension: Secondary | ICD-10-CM

## 2017-06-04 NOTE — Patient Instructions (Signed)
It was great to see you today!   Please continue taking carvedilol, hydrochlorothiazide, and lisinopril. Take your blood pressure at least 3 times per week ~2 hours after your medications. Keep a log and bring the numbers with you to your next appointment.   Follow-up with HTN clinic in 4 weeks

## 2017-06-07 ENCOUNTER — Telehealth: Payer: Self-pay | Admitting: Family Medicine

## 2017-06-07 NOTE — Telephone Encounter (Signed)
Has some questions about the test that was done in April.  She wants to know if she needs treatment

## 2017-06-07 NOTE — Telephone Encounter (Signed)
LM for patient to call back.  The only testing she will need is a repeat pap in 1 year.  Jazmin Hartsell,CMA

## 2017-06-08 NOTE — Telephone Encounter (Signed)
Patient is aware. Maria Griffith,CMA  

## 2017-07-02 ENCOUNTER — Encounter: Payer: Self-pay | Admitting: Physical Medicine & Rehabilitation

## 2017-07-02 ENCOUNTER — Ambulatory Visit (INDEPENDENT_AMBULATORY_CARE_PROVIDER_SITE_OTHER): Payer: Medicare Other | Admitting: Pharmacist

## 2017-07-02 ENCOUNTER — Ambulatory Visit (HOSPITAL_BASED_OUTPATIENT_CLINIC_OR_DEPARTMENT_OTHER): Payer: Worker's Compensation | Admitting: Physical Medicine & Rehabilitation

## 2017-07-02 ENCOUNTER — Encounter: Payer: Worker's Compensation | Attending: Registered Nurse

## 2017-07-02 ENCOUNTER — Encounter: Payer: Self-pay | Admitting: Pharmacist

## 2017-07-02 VITALS — BP 122/80 | HR 79

## 2017-07-02 VITALS — BP 147/86 | HR 76

## 2017-07-02 DIAGNOSIS — G4700 Insomnia, unspecified: Secondary | ICD-10-CM | POA: Insufficient documentation

## 2017-07-02 DIAGNOSIS — Z760 Encounter for issue of repeat prescription: Secondary | ICD-10-CM | POA: Insufficient documentation

## 2017-07-02 DIAGNOSIS — Z79899 Other long term (current) drug therapy: Secondary | ICD-10-CM | POA: Insufficient documentation

## 2017-07-02 DIAGNOSIS — G249 Dystonia, unspecified: Secondary | ICD-10-CM | POA: Insufficient documentation

## 2017-07-02 DIAGNOSIS — K5900 Constipation, unspecified: Secondary | ICD-10-CM | POA: Diagnosis not present

## 2017-07-02 DIAGNOSIS — M961 Postlaminectomy syndrome, not elsewhere classified: Secondary | ICD-10-CM | POA: Diagnosis not present

## 2017-07-02 DIAGNOSIS — M791 Myalgia: Secondary | ICD-10-CM | POA: Insufficient documentation

## 2017-07-02 DIAGNOSIS — M436 Torticollis: Secondary | ICD-10-CM | POA: Diagnosis not present

## 2017-07-02 DIAGNOSIS — G8928 Other chronic postprocedural pain: Secondary | ICD-10-CM | POA: Diagnosis not present

## 2017-07-02 DIAGNOSIS — M5412 Radiculopathy, cervical region: Secondary | ICD-10-CM | POA: Insufficient documentation

## 2017-07-02 DIAGNOSIS — I10 Essential (primary) hypertension: Secondary | ICD-10-CM | POA: Diagnosis not present

## 2017-07-02 MED ORDER — HYDROCODONE-ACETAMINOPHEN 7.5-325 MG PO TABS: 1.0000 | ORAL_TABLET | Freq: Four times a day (QID) | ORAL | 0 refills | Status: DC | PRN
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
## 2017-07-02 NOTE — Patient Instructions (Signed)
Follow up with Dr. Angelena Form as scheduled.   Check your blood pressure at home daily (if able) and keep record of the readings.  Take your BP meds as follows: CONTINUE all as prescribed.   Bring all of your meds, your BP cuff and your record of home blood pressures to your next appointment.  Exercise as you're able, try to walk approximately 30 minutes per day.  Keep salt intake to a minimum, especially watch canned and prepared boxed foods.  Eat more fresh fruits and vegetables and fewer canned items.  Avoid eating in fast food restaurants.    HOW TO TAKE YOUR BLOOD PRESSURE: . Rest 5 minutes before taking your blood pressure. .  Don't smoke or drink caffeinated beverages for at least 30 minutes before. . Take your blood pressure before (not after) you eat. . Sit comfortably with your back supported and both feet on the floor (don't cross your legs). . Elevate your arm to heart level on a table or a desk. . Use the proper sized cuff. It should fit smoothly and snugly around your bare upper arm. There should be enough room to slip a fingertip under the cuff. The bottom edge of the cuff should be 1 inch above the crease of the elbow. . Ideally, take 3 measurements at one sitting and record the average.

## 2017-07-02 NOTE — Patient Instructions (Signed)

## 2017-07-02 NOTE — Progress Notes (Signed)
   Procedure: Dysport injection Diagnosis: Cervical dystonia G 24.3 Dilution: 500 units in 1 mL sterile preservative-free normal saline  Informed consent was obtained after describing risks and benefits of the procedure with patient including bleeding bruising infection as well as the potential side effects of the medication itself. Patient elects to proceed and has given written consent.  Patient placed in a seated position Areas were marked and prepped with Betadine  Needle: 27-gauge 1 inch needle electrode connected to EMG amplifier  Right trapezius: 50 units Left trapezius: 100 units Left longissimus: 100 units Left splenius capitis: 150 units Left levator scapula: 100 units  All injections done after negative drawback for blood. Appropriate EMG activity.

## 2017-07-17 ENCOUNTER — Telehealth: Payer: Self-pay | Admitting: Family Medicine

## 2017-07-17 ENCOUNTER — Other Ambulatory Visit: Payer: Self-pay | Admitting: Family Medicine

## 2017-07-17 ENCOUNTER — Encounter: Payer: Self-pay | Admitting: Pharmacist

## 2017-07-17 MED ORDER — ALBUTEROL SULFATE (2.5 MG/3ML) 0.083% IN NEBU: 2.5000 mg | INHALATION_SOLUTION | Freq: Four times a day (QID) | RESPIRATORY_TRACT | 1 refills | Status: DC | PRN

## 2017-07-17 MED ORDER — BENZONATATE 100 MG PO CAPS: 100.0000 mg | ORAL_CAPSULE | Freq: Two times a day (BID) | ORAL | 0 refills | Status: DC | PRN

## 2017-07-17 NOTE — Telephone Encounter (Signed)
Pt contacted and informed of new rx for breathing treatment to pharmacy, as well as something for cough. I informed pt her breathing treatments should not change color and to discard. Pt verbalized understanding.

## 2017-07-17 NOTE — Telephone Encounter (Signed)
It should not change color. Might have expired. I have escribed new meds including tessalon for cough. If cough persist have her see me soon.

## 2017-07-17 NOTE — Telephone Encounter (Signed)
Is the medicine used in breathing treatments suppose to turn from clear to white?  Pt cannot stop coughing.  Please advise

## 2017-07-23 ENCOUNTER — Telehealth: Payer: Self-pay | Admitting: Family Medicine

## 2017-07-23 ENCOUNTER — Other Ambulatory Visit: Payer: Self-pay | Admitting: Family Medicine

## 2017-07-23 MED ORDER — ALBUTEROL SULFATE (2.5 MG/3ML) 0.083% IN NEBU: 2.5000 mg | INHALATION_SOLUTION | Freq: Four times a day (QID) | RESPIRATORY_TRACT | 1 refills | Status: DC | PRN

## 2017-07-23 MED ORDER — BENZONATATE 100 MG PO CAPS: 100.0000 mg | ORAL_CAPSULE | Freq: Two times a day (BID) | ORAL | 0 refills | Status: DC | PRN

## 2017-07-23 NOTE — Addendum Note (Signed)
Addended by: Andrena Mews T on: 07/23/2017 10:06 AM   Modules accepted: Orders

## 2017-07-23 NOTE — Telephone Encounter (Signed)
Pt contacted to inform of the need for tessalon. Pt stated she is coughing and would like this medication called in. Pt has not been seen in our office since April, so I advised of need for apt. Pt agreed and is scheduled for 8/7 @10am  to see pcp.

## 2017-07-23 NOTE — Telephone Encounter (Signed)
Pt is calling because she was expecting to get her medication  a couple of days ago. We faxed her prescription to the [facility]. She said that this is suppose to go to [facility] in [facility]. She is in desperate need of her medication. Can we call this over this morning so that she can have them deliver this today, jw

## 2017-07-23 NOTE — Telephone Encounter (Signed)
Medications resent [facility]. Dmiya Malphrus,CMA

## 2017-07-24 ENCOUNTER — Encounter: Payer: Self-pay | Admitting: Family Medicine

## 2017-07-24 ENCOUNTER — Ambulatory Visit (INDEPENDENT_AMBULATORY_CARE_PROVIDER_SITE_OTHER): Payer: Medicare Other | Admitting: Family Medicine

## 2017-07-24 VITALS — BP 118/60 | HR 70 | Temp 98.8°F | Ht 61.0 in | Wt 194.0 lb

## 2017-07-24 DIAGNOSIS — J45901 Unspecified asthma with (acute) exacerbation: Secondary | ICD-10-CM | POA: Diagnosis not present

## 2017-07-24 DIAGNOSIS — Z0000 Encounter for general adult medical examination without abnormal findings: Secondary | ICD-10-CM

## 2017-07-24 DIAGNOSIS — J4541 Moderate persistent asthma with (acute) exacerbation: Secondary | ICD-10-CM

## 2017-07-24 MED ORDER — PREDNISONE 20 MG PO TABS: 20.0000 mg | ORAL_TABLET | Freq: Every day | ORAL | 0 refills | Status: AC

## 2017-07-24 MED ORDER — ALBUTEROL SULFATE (2.5 MG/3ML) 0.083% IN NEBU
2.5000 mg | INHALATION_SOLUTION | Freq: Once | RESPIRATORY_TRACT | Status: AC
  Administered 2017-07-24: 2.5 mg via RESPIRATORY_TRACT

## 2017-07-24 MED ORDER — BENZONATATE 100 MG PO CAPS: 100.0000 mg | ORAL_CAPSULE | Freq: Two times a day (BID) | ORAL | 0 refills | Status: DC | PRN

## 2017-07-24 MED ORDER — METHYLPREDNISOLONE SODIUM SUCC 125 MG IJ SOLR
125.0000 mg | Freq: Once | INTRAMUSCULAR | Status: AC
  Administered 2017-07-24: 125 mg via INTRAMUSCULAR

## 2017-07-24 MED ORDER — IPRATROPIUM BROMIDE 0.02 % IN SOLN
0.5000 mg | Freq: Once | RESPIRATORY_TRACT | Status: AC
  Administered 2017-07-24: 0.5 mg via RESPIRATORY_TRACT
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
## 2017-07-24 NOTE — Assessment & Plan Note (Addendum)
Mild to moderate exacerbation with global expiratory wheeze. No sign suggestive of Pneumonia. Likely triggered by non-selective beta-blocker (Coreg) I gave 125 mg solumedrol today and she received Duoneb treatment. Patient improved some after treatment. Prednisone 20 mg qd prescribed for 7 days. Tessalon refilled prn cough. Continue home albuterol prn. Return precaution discussed. Patient advised to talk with her cardiologist regarding changing her Beta-blocker to a cardioselective one if appropriate. She agreed with plan.

## 2017-07-24 NOTE — Patient Instructions (Signed)
Asthma Attack Prevention, Adult Although you may not be able to control the fact that you have asthma, you can take actions to prevent episodes of asthma (asthma attacks). These actions include:  Creating a written plan for managing and treating your asthma attacks (asthma action plan).  Monitoring your asthma.  Avoiding things that can irritate your airways or make your asthma symptoms worse (asthma triggers).  Taking your medicines as directed.  Acting quickly if you have signs or symptoms of an asthma attack.  What are some ways to prevent an asthma attack? Create a plan Work with your health care provider to create an asthma action plan. This plan should include:  A list of your asthma triggers and how to avoid them.  A list of symptoms that you experience during an asthma attack.  Information about when to take medicine and how much medicine to take.  Information to help you understand your peak flow measurements.  Contact information for your health care providers.  Daily actions that you can take to control asthma.  Monitor your asthma  To monitor your asthma:  Use your peak flow meter every morning and every evening for 2-3 weeks. Record the results in a journal. A drop in your peak flow numbers on one or more days may mean that you are starting to have an asthma attack, even if you are not having symptoms.  When you have asthma symptoms, write them down in a journal.  Avoid asthma triggers  Work with your health care provider to find out what your asthma triggers are. This can be done by:  Being tested for allergies.  Keeping a journal that notes when asthma attacks occur and what may have contributed to them.  Asking your health care provider whether other medical conditions make your asthma worse.  Common asthma triggers include:  Dust.  Smoke. This includes campfire smoke and secondhand smoke from tobacco products.  Pet dander.  Trees, grasses or  pollens.  Very cold, dry, or humid air.  Mold.  Foods that contain high amounts of sulfites.  Strong smells.  Engine exhaust and air pollution.  Aerosol sprays and fumes from household cleaners.  Household pests and their droppings, including dust mites and cockroaches.  Certain medicines, including NSAIDs.  Once you have determined your asthma triggers, take steps to avoid them. Depending on your triggers, you may be able to reduce the chance of an asthma attack by:  Keeping your home clean. Have someone dust and vacuum your home for you 1 or 2 times a week. If possible, have them use a high-efficiency particulate arrestance (HEPA) vacuum.  Washing your sheets weekly in hot water.  Using allergy-proof mattress covers and casings on your bed.  Keeping pets out of your home.  Taking care of mold and water problems in your home.  Avoiding areas where people smoke.  Avoiding using strong perfumes or odor sprays.  Avoid spending a lot of time outdoors when pollen counts are high and on very windy days.  Talking with your health care provider before stopping or starting any new medicines.  Medicines Take over-the-counter and prescription medicines only as told by your health care provider. Many asthma attacks can be prevented by carefully following your medicine schedule. Taking your medicines correctly is especially important when you cannot avoid certain asthma triggers. Even if you are doing well, do not stop taking your medicine and do not take less medicine. Act quickly If an asthma attack happens, acting quickly   it is and how long it lasts. Take these actions:  Pay attention to your symptoms. If you are coughing, wheezing, or having difficulty breathing, do not wait to see if your symptoms go away on their own. Follow your asthma action plan.  If you have followed your asthma action plan and your symptoms are not improving, call your health care  provider or seek immediate medical care at the nearest hospital. It is important to write down how often you need to use your fast-acting rescue inhaler. You can track how often you use an inhaler in your journal. If you are using your rescue inhaler more often, it may mean that your asthma is not under control. Adjusting your asthma treatment plan may help you to prevent future asthma attacks and help you to gain better control of your condition. How can I prevent an asthma attack when I exercise?   Exercise is a common asthma trigger. To prevent asthma attacks during exercise:  Follow advice from your health care provider about whether you should use your fast-acting inhaler before exercising. Many people with asthma experience exercise-induced bronchoconstriction (EIB). This condition often worsens during vigorous exercise in cold, humid, or dry environments. Usually, people with EIB can stay very active by using a fast-acting inhaler before exercising.  Avoid exercising outdoors in very cold or humid weather.  Avoid exercising outdoors when pollen counts are high.  Warm up and cool down when exercising.  Stop exercising right away if asthma symptoms start. Consider taking part in exercises that are less likely to cause asthma symptoms such as:  Indoor swimming.  Biking.  Walking.  Hiking.  Playing football. This information is not intended to replace advice given to you by your health care provider. Make sure you discuss any questions you have with your health care provider. Document Released: 11/22/2009 Document Revised: 08/04/2016 Document Reviewed: 05/20/2016 Elsevier Interactive Patient Education  2017 [employer]vier Inc.  

## 2017-07-25 ENCOUNTER — Telehealth: Payer: Self-pay | Admitting: Cardiovascular Disease

## 2017-07-25 NOTE — Telephone Encounter (Signed)
She is on Coreg for BP control. She can stop it and follow up in primary care for further titration of her BP medications. Maria Griffith

## 2017-07-25 NOTE — Telephone Encounter (Signed)
New message    Pt c/o medication issue:  1. Name of Medication: carvedilol (COREG) 6.25 MG tablet  2. How are you currently taking this medication (dosage and times per day)? 6.25MG   3. Are you having a reaction (difficulty breathing--STAT)? Difficulty breathing  4. What is your medication issue? Pt states her PCP said that this medication could have some effect on her breathing and could be the cause of her asthma. She is now concerned and requests a call back.

## 2017-07-25 NOTE — Telephone Encounter (Signed)
Pt was started on carvedilol at cardiology visit in April 2018. If pt symptoms have worsened acutely, agree she should follow up with her PCP for asthma follow up. If she has noticed symptoms gradually over the past few months and believes side effects may be from her carvedilol (should be less likely since it's a non-selective beta blocker), ok to schedule pt in HTN clinic for further BP management.

## 2017-07-25 NOTE — Telephone Encounter (Signed)
I spoke with pt who reports shortness of breath has been gradually worsening.  I scheduled her for appointment in the hypertension clinic on 08/01/17 at 10:30.

## 2017-07-25 NOTE — Telephone Encounter (Signed)
Left message to call back  

## 2017-07-25 NOTE — Telephone Encounter (Signed)
Reviewed with Dr. Angelena Form who recommends review by pharmacist in our office as pt has recently been followed in our hypertension clinic.

## 2017-07-27 ENCOUNTER — Other Ambulatory Visit: Payer: Self-pay | Admitting: Family Medicine

## 2017-07-27 DIAGNOSIS — Z1231 Encounter for screening mammogram for malignant neoplasm of breast: Secondary | ICD-10-CM

## 2017-07-30 ENCOUNTER — Encounter: Payer: Worker's Compensation | Attending: Registered Nurse | Admitting: Registered Nurse

## 2017-07-30 ENCOUNTER — Telehealth: Payer: Self-pay | Admitting: Registered Nurse

## 2017-07-30 ENCOUNTER — Encounter: Payer: Self-pay | Admitting: Registered Nurse

## 2017-07-30 VITALS — BP 147/93 | HR 66

## 2017-07-30 DIAGNOSIS — J45909 Unspecified asthma, uncomplicated: Secondary | ICD-10-CM | POA: Insufficient documentation

## 2017-07-30 DIAGNOSIS — M791 Myalgia: Secondary | ICD-10-CM | POA: Diagnosis not present

## 2017-07-30 DIAGNOSIS — G894 Chronic pain syndrome: Secondary | ICD-10-CM | POA: Diagnosis not present

## 2017-07-30 DIAGNOSIS — M4722 Other spondylosis with radiculopathy, cervical region: Secondary | ICD-10-CM | POA: Diagnosis not present

## 2017-07-30 DIAGNOSIS — M5412 Radiculopathy, cervical region: Secondary | ICD-10-CM

## 2017-07-30 DIAGNOSIS — Z760 Encounter for issue of repeat prescription: Secondary | ICD-10-CM | POA: Insufficient documentation

## 2017-07-30 DIAGNOSIS — M7918 Myalgia, other site: Secondary | ICD-10-CM

## 2017-07-30 DIAGNOSIS — M545 Low back pain: Secondary | ICD-10-CM | POA: Diagnosis not present

## 2017-07-30 DIAGNOSIS — F419 Anxiety disorder, unspecified: Secondary | ICD-10-CM | POA: Diagnosis not present

## 2017-07-30 DIAGNOSIS — G249 Dystonia, unspecified: Secondary | ICD-10-CM | POA: Insufficient documentation

## 2017-07-30 DIAGNOSIS — I10 Essential (primary) hypertension: Secondary | ICD-10-CM | POA: Diagnosis not present

## 2017-07-30 DIAGNOSIS — Z79899 Other long term (current) drug therapy: Secondary | ICD-10-CM

## 2017-07-30 DIAGNOSIS — F329 Major depressive disorder, single episode, unspecified: Secondary | ICD-10-CM | POA: Diagnosis not present

## 2017-07-30 DIAGNOSIS — Z5181 Encounter for therapeutic drug level monitoring: Secondary | ICD-10-CM | POA: Diagnosis not present

## 2017-07-30 DIAGNOSIS — G4700 Insomnia, unspecified: Secondary | ICD-10-CM | POA: Insufficient documentation

## 2017-07-30 DIAGNOSIS — M961 Postlaminectomy syndrome, not elsewhere classified: Secondary | ICD-10-CM | POA: Diagnosis not present

## 2017-07-30 DIAGNOSIS — G8928 Other chronic postprocedural pain: Secondary | ICD-10-CM | POA: Diagnosis not present

## 2017-07-30 DIAGNOSIS — K5900 Constipation, unspecified: Secondary | ICD-10-CM | POA: Diagnosis not present

## 2017-07-30 MED ORDER — PREGABALIN 100 MG PO CAPS: 100.0000 mg | ORAL_CAPSULE | Freq: Three times a day (TID) | ORAL | 3 refills | Status: DC

## 2017-07-30 MED ORDER — TIZANIDINE HCL 4 MG PO TABS: 4.0000 mg | ORAL_TABLET | Freq: Three times a day (TID) | ORAL | 2 refills | Status: DC

## 2017-07-30 MED ORDER — TRAZODONE HCL 150 MG PO TABS: 150.0000 mg | ORAL_TABLET | Freq: Every day | ORAL | 2 refills | Status: DC

## 2017-07-30 MED ORDER — POLYETHYLENE GLYCOL 3350 17 GM/SCOOP PO POWD: ORAL | 1 refills | Status: DC

## 2017-07-30 MED ORDER — SENNOSIDES-DOCUSATE SODIUM 8.6-50 MG PO TABS: 1.0000 | ORAL_TABLET | Freq: Every day | ORAL | 5 refills | Status: DC

## 2017-07-30 MED ORDER — DICLOFENAC SODIUM 1 % TD GEL: TRANSDERMAL | 2 refills | Status: DC

## 2017-07-30 MED ORDER — HYDROCODONE-ACETAMINOPHEN 7.5-325 MG PO TABS: 1.0000 | ORAL_TABLET | Freq: Four times a day (QID) | ORAL | 0 refills | Status: DC | PRN

## 2017-07-30 NOTE — Telephone Encounter (Signed)
On 07/30/2017 the  [place] was reviewed no conflict was seen on the [place] with multiple prescribers. Ms. Hingle has a signed narcotic contract with our office. If there were any discrepancies this would have been reported to her physician.
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
## 2017-08-01 ENCOUNTER — Other Ambulatory Visit: Payer: Self-pay | Admitting: Family Medicine

## 2017-08-01 ENCOUNTER — Ambulatory Visit: Payer: Self-pay

## 2017-08-01 ENCOUNTER — Other Ambulatory Visit: Payer: Self-pay | Admitting: Registered Nurse

## 2017-08-01 ENCOUNTER — Telehealth: Payer: Self-pay | Admitting: Family Medicine

## 2017-08-01 DIAGNOSIS — N3281 Overactive bladder: Secondary | ICD-10-CM

## 2017-08-01 DIAGNOSIS — R059 Cough, unspecified: Secondary | ICD-10-CM

## 2017-08-01 DIAGNOSIS — R05 Cough: Secondary | ICD-10-CM

## 2017-08-01 MED ORDER — OXYBUTYNIN CHLORIDE ER 10 MG PO TB24: 20.0000 mg | ORAL_TABLET | Freq: Every day | ORAL | 1 refills | Status: DC

## 2017-08-01 NOTE — Telephone Encounter (Signed)
Please advise patient that this medication should be refilled by her OB/Gyn

## 2017-08-01 NOTE — Telephone Encounter (Signed)
Patient is aware of this and will plan to go to [facility] for this xray. Aften Lipsey,CMA

## 2017-08-01 NOTE — Telephone Encounter (Signed)
I spoke with patient and told her her Oxybutynin has been refilled. I also advised her to go to radiology today to get an xray of her chest to r/o infection. She stated she will go as soon as she gets a ride. I will call her with her xray result. F/U as planned or sooner if symptoms worsens.

## 2017-08-01 NOTE — Telephone Encounter (Signed)
Ok. I will go ahead and refill it. I will also order chest xray, she can go get this before coming in to see me. Thanks.

## 2017-08-01 NOTE — Addendum Note (Signed)
Addended by: Andrena Mews T on: 08/01/2017 11:28 AM   Modules accepted: Orders

## 2017-08-01 NOTE — Telephone Encounter (Signed)
Spoke with patient and she is unsure of who she saw for her gynecology care so she made an appointment on 08-07-17 to discuss this concern with PCP.  Also patient states that she is still coughing and has run out of her tessalon perles.  She still feels like there is mucus in her chest but she just can't get it up.  Will forward to MD to advise if patient can have refills for this medication or if she needs to try something different.

## 2017-08-03 ENCOUNTER — Ambulatory Visit: Payer: Self-pay

## 2017-08-03 ENCOUNTER — Ambulatory Visit (INDEPENDENT_AMBULATORY_CARE_PROVIDER_SITE_OTHER): Payer: Medicare Other | Admitting: Pharmacist

## 2017-08-03 ENCOUNTER — Ambulatory Visit
Admission: RE | Admit: 2017-08-03 | Discharge: 2017-08-03 | Disposition: A | Discharge status: Home / Self Care | Payer: Medicare Other | Source: Ambulatory Visit | Attending: Family Medicine | Admitting: Family Medicine

## 2017-08-03 ENCOUNTER — Encounter: Payer: Self-pay | Admitting: Pharmacist

## 2017-08-03 VITALS — BP 134/58 | HR 73

## 2017-08-03 DIAGNOSIS — R05 Cough: Secondary | ICD-10-CM | POA: Diagnosis not present

## 2017-08-03 DIAGNOSIS — Z1231 Encounter for screening mammogram for malignant neoplasm of breast: Secondary | ICD-10-CM

## 2017-08-03 DIAGNOSIS — R059 Cough, unspecified: Secondary | ICD-10-CM

## 2017-08-03 DIAGNOSIS — I10 Essential (primary) hypertension: Secondary | ICD-10-CM | POA: Diagnosis not present

## 2017-08-03 MED ORDER — SPIRONOLACTONE 25 MG PO TABS: 12.5000 mg | ORAL_TABLET | Freq: Every day | ORAL | 1 refills | Status: DC

## 2017-08-03 NOTE — Patient Instructions (Addendum)
It was good seeing you today.   Your blood pressure was above your goal. Because of your breathing problems, we will gradually stop carvedilol.   Take carvedilol 6.25 mg 1 tablet twice daily for ONE week. The next week, take carvedilol 6.25 mg 1/2 tablet twice daily for ONE week. Then STOP taking carvedilol completely.   Stop taking hydrochlorothiazide 25 mg and potassium 20 meq tablets.  Start taking spironolactone 25 mg 1/2 tablet by mouth once daily.   You will get lab work done on 08/13/17, anytime after 7:30 AM.   Follow up in clinic on 08/27/17 at 1:00 PM.
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
## 2017-08-06 ENCOUNTER — Telehealth: Payer: Self-pay | Admitting: Family Medicine

## 2017-08-06 MED ORDER — AZITHROMYCIN 250 MG PO TABS: ORAL_TABLET | ORAL | 0 refills | Status: AC

## 2017-08-06 NOTE — Telephone Encounter (Signed)
I called patient about her test result/chest xray to be precise which is sugesstive of pulmonary congestion. She might benefit from Lasix but as discussed with her, I will defer to her cardiology. She will call their office today to discuss xray result and need for follow-up with them.  She stated she is still coughing and she is producing thick sputum, not blood stained. She is also losing her voice from excessive coughing. All these might be multifactorial, cardiac and Asthma related symptoms. She was prescribed steroid during last visit, I will go ahead and prescribe A/B given worsening of her sputum production. Strict return precaution discussed. She stated she will cancel her appointment for tomorrow since I already spoke with her today and she will return as needed.

## 2017-08-07 ENCOUNTER — Ambulatory Visit: Payer: Self-pay | Admitting: Family Medicine

## 2017-08-13 ENCOUNTER — Ambulatory Visit (HOSPITAL_COMMUNITY)
Admission: EM | Admit: 2017-08-13 | Discharge: 2017-08-13 | Disposition: A | Discharge status: Home / Self Care | Payer: Medicare Other | Attending: Family Medicine | Admitting: Family Medicine

## 2017-08-13 ENCOUNTER — Other Ambulatory Visit: Payer: Medicare Other | Admitting: *Deleted

## 2017-08-13 ENCOUNTER — Encounter (HOSPITAL_COMMUNITY): Payer: Self-pay | Admitting: Emergency Medicine

## 2017-08-13 DIAGNOSIS — H578 Other specified disorders of eye and adnexa: Secondary | ICD-10-CM | POA: Diagnosis not present

## 2017-08-13 DIAGNOSIS — I10 Essential (primary) hypertension: Secondary | ICD-10-CM

## 2017-08-13 DIAGNOSIS — H5789 Other specified disorders of eye and adnexa: Secondary | ICD-10-CM

## 2017-08-13 MED ORDER — TETRACAINE HCL 0.5 % OP SOLN
OPHTHALMIC | Status: AC
  Filled 2017-08-13: qty 4

## 2017-08-13 MED ORDER — TETRACAINE HCL 0.5 % OP SOLN
1.0000 [drp] | Freq: Once | OPHTHALMIC | Status: AC
  Administered 2017-08-13: 1 [drp] via OPHTHALMIC
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
## 2017-08-13 NOTE — ED Notes (Signed)
Patient reports vision is still blurry but it is clearer then on arrival. Patient is able to open up eye lid at this time without force. Patient reports mild discomfort.   Patient and I discussed signs and symptoms of immediate revaluation. Verbalized understanding.

## 2017-08-13 NOTE — Discharge Instructions (Signed)
I recommend follow up with an eye doctor if not showing significant improvement within 48 hours. You may use artificial tears as needed.

## 2017-08-13 NOTE — ED Triage Notes (Signed)
PT accidentally put bactosheild CHG 4% surgical scrub in her right eye. She thought it was contact solution. This happened at 3pm yesterday. PT did flush eye with water.

## 2017-08-14 DIAGNOSIS — S0501XA Injury of conjunctiva and corneal abrasion without foreign body, right eye, initial encounter: Secondary | ICD-10-CM | POA: Diagnosis not present

## 2017-08-14 LAB — BASIC METABOLIC PANEL
BUN / CREAT RATIO: 17 (ref 9–23)
BUN: 16 mg/dL (ref 6–24)
CO2: 24 mmol/L (ref 20–29)
CREATININE: 0.96 mg/dL (ref 0.57–1.00)
Calcium: 9.2 mg/dL (ref 8.7–10.2)
Chloride: 101 mmol/L (ref 96–106)
GFR calc non Af Amer: 67 mL/min/{1.73_m2} (ref >59)
GFR, EST AFRICAN AMERICAN: 77 mL/min/{1.73_m2} (ref >59)
GLUCOSE: 95 mg/dL (ref 65–99)
Potassium: 4.1 mmol/L (ref 3.5–5.2)
SODIUM: 140 mmol/L (ref 134–144)

## 2017-08-15 DIAGNOSIS — S0501XA Injury of conjunctiva and corneal abrasion without foreign body, right eye, initial encounter: Secondary | ICD-10-CM | POA: Diagnosis not present

## 2017-08-22 ENCOUNTER — Other Ambulatory Visit: Payer: Self-pay | Admitting: Cardiovascular Disease

## 2017-08-27 ENCOUNTER — Ambulatory Visit: Payer: Medicare Other
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
## 2017-08-29 ENCOUNTER — Encounter: Payer: Self-pay | Admitting: Registered Nurse

## 2017-08-29 ENCOUNTER — Encounter: Payer: Worker's Compensation | Attending: Registered Nurse | Admitting: Registered Nurse

## 2017-08-29 VITALS — BP 150/92 | HR 73

## 2017-08-29 DIAGNOSIS — F329 Major depressive disorder, single episode, unspecified: Secondary | ICD-10-CM | POA: Diagnosis not present

## 2017-08-29 DIAGNOSIS — F419 Anxiety disorder, unspecified: Secondary | ICD-10-CM | POA: Diagnosis not present

## 2017-08-29 DIAGNOSIS — G248 Other dystonia: Secondary | ICD-10-CM | POA: Diagnosis not present

## 2017-08-29 DIAGNOSIS — G4700 Insomnia, unspecified: Secondary | ICD-10-CM | POA: Diagnosis not present

## 2017-08-29 DIAGNOSIS — G8929 Other chronic pain: Secondary | ICD-10-CM | POA: Insufficient documentation

## 2017-08-29 DIAGNOSIS — M961 Postlaminectomy syndrome, not elsewhere classified: Secondary | ICD-10-CM | POA: Insufficient documentation

## 2017-08-29 DIAGNOSIS — Y839 Surgical procedure, unspecified as the cause of abnormal reaction of the patient, or of later complication, without mention of misadventure at the time of the procedure: Secondary | ICD-10-CM | POA: Insufficient documentation

## 2017-08-29 DIAGNOSIS — I10 Essential (primary) hypertension: Secondary | ICD-10-CM | POA: Diagnosis not present

## 2017-08-29 DIAGNOSIS — G894 Chronic pain syndrome: Secondary | ICD-10-CM

## 2017-08-29 DIAGNOSIS — Z760 Encounter for issue of repeat prescription: Secondary | ICD-10-CM | POA: Insufficient documentation

## 2017-08-29 DIAGNOSIS — Z79899 Other long term (current) drug therapy: Secondary | ICD-10-CM

## 2017-08-29 DIAGNOSIS — G8928 Other chronic postprocedural pain: Secondary | ICD-10-CM | POA: Diagnosis present

## 2017-08-29 DIAGNOSIS — K5900 Constipation, unspecified: Secondary | ICD-10-CM | POA: Insufficient documentation

## 2017-08-29 DIAGNOSIS — M4722 Other spondylosis with radiculopathy, cervical region: Secondary | ICD-10-CM | POA: Diagnosis not present

## 2017-08-29 DIAGNOSIS — M791 Myalgia: Secondary | ICD-10-CM | POA: Diagnosis not present

## 2017-08-29 DIAGNOSIS — M62838 Other muscle spasm: Secondary | ICD-10-CM | POA: Insufficient documentation

## 2017-08-29 DIAGNOSIS — M5412 Radiculopathy, cervical region: Secondary | ICD-10-CM | POA: Diagnosis not present

## 2017-08-29 DIAGNOSIS — Z5181 Encounter for therapeutic drug level monitoring: Secondary | ICD-10-CM

## 2017-08-29 DIAGNOSIS — M7918 Myalgia, other site: Secondary | ICD-10-CM

## 2017-08-29 MED ORDER — HYDROCODONE-ACETAMINOPHEN 7.5-325 MG PO TABS: 1.0000 | ORAL_TABLET | Freq: Four times a day (QID) | ORAL | 0 refills | Status: DC | PRN
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
## 2017-09-06 ENCOUNTER — Telehealth: Payer: Self-pay | Admitting: Cardiovascular Disease

## 2017-09-06 MED ORDER — SPIRONOLACTONE 25 MG PO TABS: 25.0000 mg | ORAL_TABLET | Freq: Every day | ORAL | 11 refills | Status: DC

## 2017-09-06 NOTE — Telephone Encounter (Signed)
New message   [facility] calling with questions regarding directions on the medication below  Pt c/o medication issue:  1. Name of Medication: spironolactone (ALDACTONE) 25 MG tablet  2. How are you currently taking this medication (dosage and times per day)? 25mg   3. Are you having a reaction (difficulty breathing--STAT)? NO  4. What is your medication issue? The pt called in refill and it was too early on insurance. Pt is taking 1 tablet daily. On script it says .5 daily. Pt states Dr states to take 1 tablet daily but they dont have the script that says that.

## 2017-09-06 NOTE — Telephone Encounter (Signed)
Recent medications have been adjusted in hypertension clinic.  Will forward to pharmacist

## 2017-09-06 NOTE — Telephone Encounter (Signed)
Pt was seen by Tana Coast PharmD in HTN clinic on 8/17 and was started on spironolactone 12.5mg  daily. Her prescription is correct at 12.5mg  daily, not sure why patient has been taking 25mg  daily.   Called pt who states she has been taking spironolactone 25mg  each day since it was prescribed. F/u BMET was stable (assumed she was on 12.5mg  instead of 25mg ). Since she reports BP has been controlled, will continue spironolactone 25mg  and I sent in a new prescription for the higher dose. Pt will keep f/u appt in HTN clinic next week.

## 2017-09-12 ENCOUNTER — Ambulatory Visit (INDEPENDENT_AMBULATORY_CARE_PROVIDER_SITE_OTHER): Payer: Medicare Other

## 2017-09-12 VITALS — BP 148/80 | HR 74

## 2017-09-12 DIAGNOSIS — I10 Essential (primary) hypertension: Secondary | ICD-10-CM | POA: Diagnosis not present
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
## 2017-09-12 NOTE — Patient Instructions (Signed)
It was nice to meet you today!  We sent your spironolactone 25 mg daily to your pharmacy. You can take that in the morning with your lisinopril.   Continue to take your blood pressures at home and continue to watch your sodium in your diet. Follow the plate method and see if it assists you in your weight loss journey. Keep up the yoga classes!  We will see you for your follow-up appointment in 3-4 weeks.
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
## 2017-10-01 ENCOUNTER — Ambulatory Visit: Payer: Self-pay | Admitting: Physical Medicine & Rehabilitation

## 2017-10-01 ENCOUNTER — Ambulatory Visit: Payer: Medicare Other

## 2017-10-02 ENCOUNTER — Encounter: Payer: Worker's Compensation | Attending: Physical Medicine & Rehabilitation

## 2017-10-02 ENCOUNTER — Encounter: Payer: Self-pay | Admitting: Physical Medicine & Rehabilitation

## 2017-10-02 ENCOUNTER — Ambulatory Visit (HOSPITAL_BASED_OUTPATIENT_CLINIC_OR_DEPARTMENT_OTHER): Payer: Worker's Compensation | Admitting: Physical Medicine & Rehabilitation

## 2017-10-02 VITALS — BP 120/84 | HR 71

## 2017-10-02 DIAGNOSIS — Z79899 Other long term (current) drug therapy: Secondary | ICD-10-CM | POA: Insufficient documentation

## 2017-10-02 DIAGNOSIS — G243 Spasmodic torticollis: Secondary | ICD-10-CM

## 2017-10-02 DIAGNOSIS — G894 Chronic pain syndrome: Secondary | ICD-10-CM | POA: Insufficient documentation

## 2017-10-02 DIAGNOSIS — Z5181 Encounter for therapeutic drug level monitoring: Secondary | ICD-10-CM

## 2017-10-02 MED ORDER — HYDROCODONE-ACETAMINOPHEN 7.5-325 MG PO TABS: 1.0000 | ORAL_TABLET | Freq: Four times a day (QID) | ORAL | 0 refills | Status: DC | PRN

## 2017-10-02 MED ORDER — SENNOSIDES-DOCUSATE SODIUM 8.6-50 MG PO TABS: 1.0000 | ORAL_TABLET | Freq: Every day | ORAL | 5 refills | Status: DC

## 2017-10-02 MED ORDER — TRAZODONE HCL 150 MG PO TABS: 150.0000 mg | ORAL_TABLET | Freq: Every day | ORAL | 2 refills | Status: DC

## 2017-10-02 NOTE — Progress Notes (Signed)
   Procedure: Dysport injection Diagnosis: Cervical dystonia G 24.3 Dilution: 500 units in 1 mL sterile preservative-free normal saline  Informed consent was obtained after describing risks and benefits of the procedure with patient including bleeding bruising infection as well as the potential side effects of the medication itself. Patient elects to proceed and has given written consent.  Patient placed in a seated position Areas were marked and prepped with Betadine  Needle: 27-gauge 1 inch needle electrode connected to EMG amplifier  Right trapezius: 50 units Left trapezius: 100 units Left longissimus: 100 units Left splenius capitis: 150 units Left levator scapula: 100 units  All injections done after negative drawback for blood. Appropriate EMG activity.  Consider reduction in dose No right trapezius needed. Reduce left trapezius to 50 reduce left levator to 50

## 2017-10-02 NOTE — Patient Instructions (Signed)

## 2017-10-06 LAB — DRUG TOX MONITOR 1 W/CONF, ORAL FLD
AMPHETAMINES: NEGATIVE ng/mL (ref <10)
BARBITURATES: NEGATIVE ng/mL (ref <10)
BENZODIAZEPINES: NEGATIVE ng/mL (ref <0.50)
BUPRENORPHINE: NEGATIVE ng/mL (ref <0.025)
CODEINE: NEGATIVE ng/mL (ref <2.5)
Cocaine: NEGATIVE ng/mL (ref <2.5)
DIHYDROCODEINE: 2.7 ng/mL — AB (ref <2.5)
Fentanyl: NEGATIVE ng/mL (ref <0.10)
HYDROMORPHONE: NEGATIVE ng/mL (ref <2.5)
Heroin Metabolite: NEGATIVE ng/mL (ref <1.0)
Hydrocodone: 25.7 ng/mL — ABNORMAL HIGH (ref <2.5)
MARIJUANA: NEGATIVE ng/mL (ref <2.5)
MDMA: NEGATIVE ng/mL (ref <10)
METHADONE: NEGATIVE ng/mL (ref <5.0)
MORPHINE: NEGATIVE ng/mL (ref <2.5)
Meperidine: NEGATIVE ng/mL (ref <5.0)
Meprobamate: NEGATIVE ng/mL (ref <2.5)
Nicotine Metabolite: NEGATIVE ng/mL (ref <5.0)
Norhydrocodone: NEGATIVE ng/mL (ref <2.5)
Noroxycodone: NEGATIVE ng/mL (ref <2.5)
Opiates: POSITIVE ng/mL — AB (ref <2.5)
Oxycodone: NEGATIVE ng/mL (ref <2.5)
Oxymorphone: NEGATIVE ng/mL (ref <2.5)
PROPOXYPHENE: NEGATIVE ng/mL (ref <5.0)
Phencyclidine: NEGATIVE ng/mL (ref <10)
TAPENTADOL: NEGATIVE ng/mL (ref <5.0)
TRAMADOL: NEGATIVE ng/mL (ref <5.0)
ZOLPIDEM: NEGATIVE ng/mL (ref <5.0)

## 2017-10-06 LAB — DRUG TOX ALC METAB W/CON, ORAL FLD: ALCOHOL METABOLITE: NEGATIVE ng/mL (ref <25)
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
## 2017-10-08 ENCOUNTER — Telehealth: Payer: Self-pay | Admitting: *Deleted

## 2017-10-08 ENCOUNTER — Ambulatory Visit: Payer: Medicare Other | Admitting: Pharmacist

## 2017-10-08 NOTE — Telephone Encounter (Signed)
Oral swab drug screen was consistent for prescribed medications.  ?

## 2017-10-31 ENCOUNTER — Encounter: Payer: Medicare Other | Attending: Registered Nurse | Admitting: Registered Nurse

## 2017-10-31 ENCOUNTER — Other Ambulatory Visit: Payer: Self-pay

## 2017-10-31 ENCOUNTER — Encounter: Payer: Self-pay | Admitting: Registered Nurse

## 2017-10-31 VITALS — BP 158/115 | HR 66

## 2017-10-31 DIAGNOSIS — F419 Anxiety disorder, unspecified: Secondary | ICD-10-CM | POA: Insufficient documentation

## 2017-10-31 DIAGNOSIS — Z981 Arthrodesis status: Secondary | ICD-10-CM | POA: Diagnosis not present

## 2017-10-31 DIAGNOSIS — G894 Chronic pain syndrome: Secondary | ICD-10-CM | POA: Diagnosis not present

## 2017-10-31 DIAGNOSIS — M4722 Other spondylosis with radiculopathy, cervical region: Secondary | ICD-10-CM

## 2017-10-31 DIAGNOSIS — G8928 Other chronic postprocedural pain: Secondary | ICD-10-CM | POA: Diagnosis not present

## 2017-10-31 DIAGNOSIS — Z8249 Family history of ischemic heart disease and other diseases of the circulatory system: Secondary | ICD-10-CM | POA: Diagnosis not present

## 2017-10-31 DIAGNOSIS — M961 Postlaminectomy syndrome, not elsewhere classified: Secondary | ICD-10-CM | POA: Diagnosis not present

## 2017-10-31 DIAGNOSIS — Z79899 Other long term (current) drug therapy: Secondary | ICD-10-CM | POA: Diagnosis not present

## 2017-10-31 DIAGNOSIS — K5900 Constipation, unspecified: Secondary | ICD-10-CM | POA: Diagnosis not present

## 2017-10-31 DIAGNOSIS — I10 Essential (primary) hypertension: Secondary | ICD-10-CM | POA: Diagnosis not present

## 2017-10-31 DIAGNOSIS — M7918 Myalgia, other site: Secondary | ICD-10-CM | POA: Diagnosis not present

## 2017-10-31 DIAGNOSIS — M5412 Radiculopathy, cervical region: Secondary | ICD-10-CM | POA: Diagnosis not present

## 2017-10-31 DIAGNOSIS — G4709 Other insomnia: Secondary | ICD-10-CM

## 2017-10-31 DIAGNOSIS — G4700 Insomnia, unspecified: Secondary | ICD-10-CM | POA: Insufficient documentation

## 2017-10-31 DIAGNOSIS — Z5329 Procedure and treatment not carried out because of patient's decision for other reasons: Secondary | ICD-10-CM | POA: Diagnosis not present

## 2017-10-31 DIAGNOSIS — G249 Dystonia, unspecified: Secondary | ICD-10-CM | POA: Insufficient documentation

## 2017-10-31 DIAGNOSIS — F329 Major depressive disorder, single episode, unspecified: Secondary | ICD-10-CM | POA: Insufficient documentation

## 2017-10-31 DIAGNOSIS — Z79891 Long term (current) use of opiate analgesic: Secondary | ICD-10-CM | POA: Insufficient documentation

## 2017-10-31 DIAGNOSIS — Z5181 Encounter for therapeutic drug level monitoring: Secondary | ICD-10-CM | POA: Diagnosis not present

## 2017-10-31 DIAGNOSIS — G8929 Other chronic pain: Secondary | ICD-10-CM | POA: Diagnosis present

## 2017-10-31 DIAGNOSIS — J45909 Unspecified asthma, uncomplicated: Secondary | ICD-10-CM | POA: Insufficient documentation

## 2017-10-31 MED ORDER — POLYETHYLENE GLYCOL 3350 17 GM/SCOOP PO POWD: ORAL | 2 refills | Status: DC

## 2017-10-31 MED ORDER — PREGABALIN 100 MG PO CAPS: 100.0000 mg | ORAL_CAPSULE | Freq: Three times a day (TID) | ORAL | 3 refills | Status: DC

## 2017-10-31 MED ORDER — TIZANIDINE HCL 4 MG PO TABS: 4.0000 mg | ORAL_TABLET | Freq: Three times a day (TID) | ORAL | 2 refills | Status: DC

## 2017-10-31 MED ORDER — DICLOFENAC SODIUM 1 % TD GEL: TRANSDERMAL | 2 refills | Status: DC

## 2017-10-31 MED ORDER — HYDROCODONE-ACETAMINOPHEN 7.5-325 MG PO TABS: 1.0000 | ORAL_TABLET | Freq: Four times a day (QID) | ORAL | 0 refills | Status: DC | PRN
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
## 2017-11-01 ENCOUNTER — Other Ambulatory Visit: Payer: Self-pay | Admitting: Registered Nurse

## 2017-11-05 ENCOUNTER — Other Ambulatory Visit: Payer: Self-pay | Admitting: Family Medicine

## 2017-11-05 DIAGNOSIS — N3281 Overactive bladder: Secondary | ICD-10-CM

## 2017-11-28 ENCOUNTER — Encounter: Payer: Medicare Other | Admitting: Registered Nurse

## 2017-12-04 ENCOUNTER — Encounter: Payer: Self-pay | Admitting: Student in an Organized Health Care Education/Training Program

## 2017-12-04 ENCOUNTER — Ambulatory Visit (INDEPENDENT_AMBULATORY_CARE_PROVIDER_SITE_OTHER): Payer: Medicare Other | Admitting: Student in an Organized Health Care Education/Training Program

## 2017-12-04 ENCOUNTER — Other Ambulatory Visit: Payer: Self-pay

## 2017-12-04 ENCOUNTER — Encounter: Payer: Self-pay | Admitting: Registered Nurse

## 2017-12-04 ENCOUNTER — Other Ambulatory Visit (HOSPITAL_COMMUNITY)
Admission: RE | Admit: 2017-12-04 | Discharge: 2017-12-04 | Disposition: A | Discharge status: Home / Self Care | Payer: Medicare Other | Source: Ambulatory Visit | Attending: Family Medicine | Admitting: Family Medicine

## 2017-12-04 ENCOUNTER — Encounter: Payer: Medicare Other | Attending: Registered Nurse | Admitting: Registered Nurse

## 2017-12-04 VITALS — BP 124/87 | HR 67

## 2017-12-04 VITALS — BP 126/82 | HR 73 | Temp 98.2°F | Ht 61.0 in | Wt 186.4 lb

## 2017-12-04 DIAGNOSIS — Z79899 Other long term (current) drug therapy: Secondary | ICD-10-CM | POA: Insufficient documentation

## 2017-12-04 DIAGNOSIS — Z8249 Family history of ischemic heart disease and other diseases of the circulatory system: Secondary | ICD-10-CM | POA: Diagnosis not present

## 2017-12-04 DIAGNOSIS — Z23 Encounter for immunization: Secondary | ICD-10-CM | POA: Diagnosis not present

## 2017-12-04 DIAGNOSIS — G8928 Other chronic postprocedural pain: Secondary | ICD-10-CM | POA: Insufficient documentation

## 2017-12-04 DIAGNOSIS — Z113 Encounter for screening for infections with a predominantly sexual mode of transmission: Secondary | ICD-10-CM | POA: Insufficient documentation

## 2017-12-04 DIAGNOSIS — Z5329 Procedure and treatment not carried out because of patient's decision for other reasons: Secondary | ICD-10-CM | POA: Diagnosis not present

## 2017-12-04 DIAGNOSIS — F329 Major depressive disorder, single episode, unspecified: Secondary | ICD-10-CM | POA: Diagnosis not present

## 2017-12-04 DIAGNOSIS — G894 Chronic pain syndrome: Secondary | ICD-10-CM | POA: Diagnosis not present

## 2017-12-04 DIAGNOSIS — N760 Acute vaginitis: Secondary | ICD-10-CM

## 2017-12-04 DIAGNOSIS — N898 Other specified noninflammatory disorders of vagina: Secondary | ICD-10-CM

## 2017-12-04 DIAGNOSIS — M961 Postlaminectomy syndrome, not elsewhere classified: Secondary | ICD-10-CM | POA: Insufficient documentation

## 2017-12-04 DIAGNOSIS — M4722 Other spondylosis with radiculopathy, cervical region: Secondary | ICD-10-CM

## 2017-12-04 DIAGNOSIS — K5900 Constipation, unspecified: Secondary | ICD-10-CM | POA: Diagnosis not present

## 2017-12-04 DIAGNOSIS — R300 Dysuria: Secondary | ICD-10-CM

## 2017-12-04 DIAGNOSIS — B9689 Other specified bacterial agents as the cause of diseases classified elsewhere: Secondary | ICD-10-CM

## 2017-12-04 DIAGNOSIS — Z79891 Long term (current) use of opiate analgesic: Secondary | ICD-10-CM | POA: Diagnosis not present

## 2017-12-04 DIAGNOSIS — M7918 Myalgia, other site: Secondary | ICD-10-CM | POA: Insufficient documentation

## 2017-12-04 DIAGNOSIS — Z5181 Encounter for therapeutic drug level monitoring: Secondary | ICD-10-CM

## 2017-12-04 DIAGNOSIS — G249 Dystonia, unspecified: Secondary | ICD-10-CM | POA: Insufficient documentation

## 2017-12-04 DIAGNOSIS — M5412 Radiculopathy, cervical region: Secondary | ICD-10-CM

## 2017-12-04 DIAGNOSIS — G8929 Other chronic pain: Secondary | ICD-10-CM | POA: Diagnosis present

## 2017-12-04 DIAGNOSIS — R41840 Attention and concentration deficit: Secondary | ICD-10-CM | POA: Diagnosis not present

## 2017-12-04 DIAGNOSIS — J069 Acute upper respiratory infection, unspecified: Secondary | ICD-10-CM | POA: Diagnosis not present

## 2017-12-04 DIAGNOSIS — I10 Essential (primary) hypertension: Secondary | ICD-10-CM | POA: Diagnosis not present

## 2017-12-04 DIAGNOSIS — F419 Anxiety disorder, unspecified: Secondary | ICD-10-CM | POA: Insufficient documentation

## 2017-12-04 DIAGNOSIS — G4700 Insomnia, unspecified: Secondary | ICD-10-CM | POA: Diagnosis not present

## 2017-12-04 DIAGNOSIS — J45909 Unspecified asthma, uncomplicated: Secondary | ICD-10-CM | POA: Diagnosis not present

## 2017-12-04 DIAGNOSIS — G4709 Other insomnia: Secondary | ICD-10-CM

## 2017-12-04 DIAGNOSIS — Z981 Arthrodesis status: Secondary | ICD-10-CM | POA: Insufficient documentation

## 2017-12-04 DIAGNOSIS — Z114 Encounter for screening for human immunodeficiency virus [HIV]: Secondary | ICD-10-CM | POA: Diagnosis not present

## 2017-12-04 LAB — POCT WET PREP (WET MOUNT)
CLUE CELLS WET PREP WHIFF POC: POSITIVE
TRICHOMONAS WET PREP HPF POC: ABSENT

## 2017-12-04 LAB — POCT URINALYSIS DIP (MANUAL ENTRY)
Bilirubin, UA: NEGATIVE
Glucose, UA: NEGATIVE mg/dL
Ketones, POC UA: NEGATIVE mg/dL
Leukocytes, UA: NEGATIVE
Nitrite, UA: NEGATIVE
PH UA: 5.5 (ref 5.0–8.0)
PROTEIN UA: NEGATIVE mg/dL
RBC UA: NEGATIVE
SPEC GRAV UA: 1.025 (ref 1.010–1.025)
UROBILINOGEN UA: 0.2 U/dL

## 2017-12-04 MED ORDER — METRONIDAZOLE 500 MG PO TABS: 500.0000 mg | ORAL_TABLET | Freq: Two times a day (BID) | ORAL | 0 refills | Status: DC

## 2017-12-04 MED ORDER — HYDROCODONE-ACETAMINOPHEN 7.5-325 MG PO TABS: 1.0000 | ORAL_TABLET | Freq: Four times a day (QID) | ORAL | 0 refills | Status: DC | PRN
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
## 2017-12-04 NOTE — Patient Instructions (Signed)
It was a pleasure seeing you today in our clinic. Today we discussed bacterial vaginosis. Here is the treatment plan we have discussed and agreed upon together:  Please take the antibiotic that I prescribed twice daily for 7 days. I expect that her symptoms will gradually improve and resolve.  We drew blood work at today's visit. I will call or send you a letter with these results. If you do not hear from me within the next week, please give our office a call.  Our clinic's number is [phone]. Please call with questions or concerns about what we discussed today.  Be well, Dr. Burr Medico

## 2017-12-04 NOTE — Progress Notes (Signed)
   CC: vaginal discharge  HPI: Maria Griffith is a 56 y.o. female with PMH significant for asthma, HTN, PTSD, MDD who presents to [facility] today with dysuria and vaginal discomfort of several days duration.   Vaginal Discharge Patient reports she has unusual vaginal discharge and vaginal discomfort. She denies bleeding. She does feel she is urinating more frequently though denies dysuria. She denies fevers, nausea, or vomiting. No diarrhea or constipation.She does endorse having one new sexual partner and she reports she would like STI testing. She does request oral swab for gonorrhea and chlamydia. She denies pelvic pain.  URI symptoms Patient endorses recent congestion, cough and throat pain. She denies fevers, N/V/D/C. Denies sick contacts. Symptoms started 2-3 days ago.  Review of Symptoms:  See HPI for ROS.   CC, SH/smoking status, and VS noted.  Objective: BP 126/82   Pulse 73   Temp 98.2 F (36.8 C) (Oral)   Ht 5\' 1"  (1.549 m)   Wt 186 lb 6.4 oz (84.6 kg)   LMP 10/22/2014 (Approximate)   SpO2 94%   BMI 35.22 kg/m  GEN: NAD, alert, cooperative, and pleasant. EYE: no conjunctival injection, pupils equally round and reactive to light ENMT: normal tympanic light reflex, no nasal polyps,no rhinorrhea, no pharyngeal erythema or exudates NECK: full ROM, no thyromegaly RESPIRATORY: clear to auscultation bilaterally with no wheezes, rhonchi or rales, good effort CV: RRR, no m/r/g, no peripheral edema GI: soft, non-tender, non-distended, no hepatosplenomegaly Female genitalia: Vulva: normal appearing vulva with no masses, tenderness or lesions Vagina: normal appearing vagina with normal color and discharge, no lesions Cervix: cervical discharge present - white and cervical motion tenderness absent  Assessment and plan:  Routine screening for STI (sexually transmitted infection) Wet prep negative for trich. Also tested GC/Chla cervical and oral swabs. HIV, RPR drawn. Will follow up  with results with patient.  Bacterial vaginosis Noted on wet prep. Likely cause of patient's presenting symptom with vaginal discomfort and discharge.  UA was negative for indication of urinary infection. - metronidazole 500 mg BID x 7 days - return precautions provided  Acute upper respiratory infection Supportive care with OTC meds if needed, staying hydrated, drinking plenty of water. Return precautions advised.   Orders Placed This Encounter  Procedures  . Flu Vaccine QUAD 36+ mos IM  . RPR  . HIV antibody  . POCT Wet Prep [employer])  . POCT urinalysis dipstick    Meds ordered this encounter  Medications  . metroNIDAZOLE (FLAGYL) 500 MG tablet    Sig: Take 1 tablet (500 mg total) by mouth 2 (two) times daily.    Dispense:  14 tablet    Refill:  0    Everrett Coombe, MD,MS,  PGY2 12/09/2017 7:02 AM

## 2017-12-05 LAB — RPR: RPR: NONREACTIVE

## 2017-12-05 LAB — CERVICOVAGINAL ANCILLARY ONLY
Chlamydia: NEGATIVE
Neisseria Gonorrhea: NEGATIVE

## 2017-12-05 LAB — HIV ANTIBODY (ROUTINE TESTING W REFLEX): HIV Screen 4th Generation wRfx: NONREACTIVE

## 2017-12-06 LAB — CERVICOVAGINAL ANCILLARY ONLY
Chlamydia: NEGATIVE
Neisseria Gonorrhea: NEGATIVE

## 2017-12-09 ENCOUNTER — Encounter: Payer: Self-pay | Admitting: Student in an Organized Health Care Education/Training Program

## 2017-12-09 DIAGNOSIS — J069 Acute upper respiratory infection, unspecified: Secondary | ICD-10-CM | POA: Insufficient documentation

## 2017-12-09 DIAGNOSIS — N760 Acute vaginitis: Principal | ICD-10-CM

## 2017-12-09 DIAGNOSIS — B9689 Other specified bacterial agents as the cause of diseases classified elsewhere: Secondary | ICD-10-CM | POA: Insufficient documentation

## 2017-12-09 NOTE — Assessment & Plan Note (Signed)
Supportive care with OTC meds if needed, staying hydrated, drinking plenty of water. Return precautions advised.

## 2017-12-09 NOTE — Assessment & Plan Note (Signed)
Wet prep negative for trich. Also tested GC/Chla cervical and oral swabs. HIV, RPR drawn. Will follow up with results with patient.

## 2017-12-09 NOTE — Assessment & Plan Note (Addendum)
Noted on wet prep. Likely cause of patient's presenting symptom with vaginal discomfort and discharge.  UA was negative for indication of urinary infection. - metronidazole 500 mg BID x 7 days - return precautions provided

## 2018-01-08 ENCOUNTER — Other Ambulatory Visit: Payer: Self-pay | Admitting: Cardiovascular Disease

## 2018-01-08 ENCOUNTER — Encounter: Payer: Self-pay | Admitting: Physical Medicine & Rehabilitation

## 2018-01-08 ENCOUNTER — Ambulatory Visit (HOSPITAL_BASED_OUTPATIENT_CLINIC_OR_DEPARTMENT_OTHER): Payer: Worker's Compensation | Admitting: Physical Medicine & Rehabilitation

## 2018-01-08 ENCOUNTER — Encounter: Payer: Worker's Compensation | Attending: Registered Nurse

## 2018-01-08 VITALS — BP 172/107 | HR 75 | Resp 14

## 2018-01-08 DIAGNOSIS — I10 Essential (primary) hypertension: Secondary | ICD-10-CM | POA: Diagnosis not present

## 2018-01-08 DIAGNOSIS — Z5329 Procedure and treatment not carried out because of patient's decision for other reasons: Secondary | ICD-10-CM | POA: Insufficient documentation

## 2018-01-08 DIAGNOSIS — M7918 Myalgia, other site: Secondary | ICD-10-CM | POA: Diagnosis not present

## 2018-01-08 DIAGNOSIS — G4700 Insomnia, unspecified: Secondary | ICD-10-CM | POA: Diagnosis not present

## 2018-01-08 DIAGNOSIS — K5900 Constipation, unspecified: Secondary | ICD-10-CM | POA: Insufficient documentation

## 2018-01-08 DIAGNOSIS — J45909 Unspecified asthma, uncomplicated: Secondary | ICD-10-CM | POA: Insufficient documentation

## 2018-01-08 DIAGNOSIS — M961 Postlaminectomy syndrome, not elsewhere classified: Secondary | ICD-10-CM | POA: Diagnosis not present

## 2018-01-08 DIAGNOSIS — G243 Spasmodic torticollis: Secondary | ICD-10-CM | POA: Diagnosis not present

## 2018-01-08 DIAGNOSIS — M5412 Radiculopathy, cervical region: Secondary | ICD-10-CM | POA: Insufficient documentation

## 2018-01-08 DIAGNOSIS — F419 Anxiety disorder, unspecified: Secondary | ICD-10-CM | POA: Diagnosis not present

## 2018-01-08 DIAGNOSIS — G8928 Other chronic postprocedural pain: Secondary | ICD-10-CM | POA: Diagnosis not present

## 2018-01-08 DIAGNOSIS — Z8249 Family history of ischemic heart disease and other diseases of the circulatory system: Secondary | ICD-10-CM | POA: Insufficient documentation

## 2018-01-08 DIAGNOSIS — G249 Dystonia, unspecified: Secondary | ICD-10-CM | POA: Diagnosis not present

## 2018-01-08 DIAGNOSIS — Z79899 Other long term (current) drug therapy: Secondary | ICD-10-CM | POA: Insufficient documentation

## 2018-01-08 DIAGNOSIS — F329 Major depressive disorder, single episode, unspecified: Secondary | ICD-10-CM | POA: Diagnosis not present

## 2018-01-08 DIAGNOSIS — Z981 Arthrodesis status: Secondary | ICD-10-CM | POA: Diagnosis not present

## 2018-01-08 DIAGNOSIS — Z79891 Long term (current) use of opiate analgesic: Secondary | ICD-10-CM | POA: Diagnosis not present

## 2018-01-08 DIAGNOSIS — G8929 Other chronic pain: Secondary | ICD-10-CM | POA: Diagnosis present

## 2018-01-08 MED ORDER — HYDROCODONE-ACETAMINOPHEN 7.5-325 MG PO TABS: 1.0000 | ORAL_TABLET | Freq: Four times a day (QID) | ORAL | 0 refills | Status: DC | PRN

## 2018-01-08 NOTE — Progress Notes (Signed)
   Procedure: Dysport injection Diagnosis: Cervical dystonia G 24.3 Dilution: 500 units in 1 mL sterile preservative-free normal saline  Informed consent was obtained after describing risks and benefits of the procedure with patient including bleeding bruising infection as well as the potential side effects of the medication itself. Patient elects to proceed and has given written consent.  Patient placed in a seated position Areas were marked and prepped with Betadine  Needle: 27-gauge 1 inch needle electrode connected to EMG amplifier  Right trapezius: 50 units Left trapezius: 50 units Left longissimus: 100 units Left splenius capitis: 100 units Lef Levator scapula: 50  All injections done after negative drawback for blood. Appropriate EMG activity.  Next visit 3 mo for repeat with 300 U dysport  Will not inject R trap

## 2018-01-29 ENCOUNTER — Telehealth: Payer: Self-pay | Admitting: Registered Nurse

## 2018-01-29 NOTE — Telephone Encounter (Signed)
Return Maria Griffith call no answer, left message to return the call.

## 2018-01-29 NOTE — Telephone Encounter (Signed)
PATIENT CALLED AND STATES SHE IS CONFUSED ABOUT HER MEDS. THINKS MAYBE DR HEWITT? NOT SURE OF NAME - CALL VERY CONFUSED PLEASE CALL HER [phone]

## 2018-01-31 ENCOUNTER — Telehealth: Payer: Self-pay | Admitting: Registered Nurse

## 2018-01-31 NOTE — Telephone Encounter (Signed)
Return Maria Griffith call, she was requesting for new prescriptions for her heating wrap for neck,  And back also for the [facility]. She stated [name] her case manager no longer works for [employer], she doesn't have a name of her adjustor. She was instructed to obtain a phone number and name of the person she would like for our office to speak to, she verbalizes understanding.

## 2018-01-31 NOTE — Telephone Encounter (Signed)
Maria Griffith - per Case Worker Deidre Ala Knotts he needs a script for neck wrap, [facility] supply[facility] and would need letter of medical necessity for [employer] - because they approved that in the past and she did not use it.  You may fax the documents to [place] [phone]

## 2018-01-31 NOTE — Telephone Encounter (Signed)
Patient called and wants to talk to you - she left phone number but no reason [phone]

## 2018-01-31 NOTE — Telephone Encounter (Signed)
Called Maria Griffith, [name] is PRO-M-200.  Also states she has been going to the [facility] twice a week . We will write a letter of necessity and order the Vivian for her  Neck and Lumbar, they were dispensing a case of each per Maria Griffith.  Maria Griffith aware of the above and verbalizes understanding.

## 2018-02-05 ENCOUNTER — Encounter: Payer: Self-pay | Admitting: Registered Nurse

## 2018-02-05 ENCOUNTER — Encounter: Payer: Worker's Compensation | Attending: Registered Nurse | Admitting: Registered Nurse

## 2018-02-05 ENCOUNTER — Other Ambulatory Visit: Payer: Self-pay

## 2018-02-05 VITALS — BP 137/90 | HR 67

## 2018-02-05 DIAGNOSIS — M62838 Other muscle spasm: Secondary | ICD-10-CM | POA: Diagnosis not present

## 2018-02-05 DIAGNOSIS — J45909 Unspecified asthma, uncomplicated: Secondary | ICD-10-CM | POA: Diagnosis not present

## 2018-02-05 DIAGNOSIS — I10 Essential (primary) hypertension: Secondary | ICD-10-CM | POA: Diagnosis not present

## 2018-02-05 DIAGNOSIS — G8928 Other chronic postprocedural pain: Secondary | ICD-10-CM | POA: Insufficient documentation

## 2018-02-05 DIAGNOSIS — M4722 Other spondylosis with radiculopathy, cervical region: Secondary | ICD-10-CM | POA: Diagnosis not present

## 2018-02-05 DIAGNOSIS — F419 Anxiety disorder, unspecified: Secondary | ICD-10-CM | POA: Insufficient documentation

## 2018-02-05 DIAGNOSIS — G894 Chronic pain syndrome: Secondary | ICD-10-CM

## 2018-02-05 DIAGNOSIS — M5412 Radiculopathy, cervical region: Secondary | ICD-10-CM | POA: Diagnosis not present

## 2018-02-05 DIAGNOSIS — K5900 Constipation, unspecified: Secondary | ICD-10-CM | POA: Diagnosis not present

## 2018-02-05 DIAGNOSIS — G243 Spasmodic torticollis: Secondary | ICD-10-CM

## 2018-02-05 DIAGNOSIS — M7918 Myalgia, other site: Secondary | ICD-10-CM | POA: Diagnosis not present

## 2018-02-05 DIAGNOSIS — G4709 Other insomnia: Secondary | ICD-10-CM | POA: Diagnosis not present

## 2018-02-05 DIAGNOSIS — Z79899 Other long term (current) drug therapy: Secondary | ICD-10-CM

## 2018-02-05 DIAGNOSIS — Z5181 Encounter for therapeutic drug level monitoring: Secondary | ICD-10-CM

## 2018-02-05 DIAGNOSIS — G249 Dystonia, unspecified: Secondary | ICD-10-CM | POA: Insufficient documentation

## 2018-02-05 DIAGNOSIS — G4700 Insomnia, unspecified: Secondary | ICD-10-CM | POA: Diagnosis not present

## 2018-02-05 DIAGNOSIS — Z760 Encounter for issue of repeat prescription: Secondary | ICD-10-CM | POA: Insufficient documentation

## 2018-02-05 DIAGNOSIS — F329 Major depressive disorder, single episode, unspecified: Secondary | ICD-10-CM | POA: Insufficient documentation

## 2018-02-05 DIAGNOSIS — M961 Postlaminectomy syndrome, not elsewhere classified: Secondary | ICD-10-CM | POA: Insufficient documentation

## 2018-02-05 MED ORDER — PREGABALIN 100 MG PO CAPS: 100.0000 mg | ORAL_CAPSULE | Freq: Three times a day (TID) | ORAL | 3 refills | Status: DC

## 2018-02-05 MED ORDER — DICLOFENAC SODIUM 1 % TD GEL: TRANSDERMAL | 2 refills | Status: DC

## 2018-02-05 MED ORDER — SENNOSIDES-DOCUSATE SODIUM 8.6-50 MG PO TABS: 1.0000 | ORAL_TABLET | Freq: Every day | ORAL | 5 refills | Status: DC

## 2018-02-05 MED ORDER — TRAZODONE HCL 150 MG PO TABS: 150.0000 mg | ORAL_TABLET | Freq: Every day | ORAL | 2 refills | Status: DC

## 2018-02-05 MED ORDER — TIZANIDINE HCL 4 MG PO TABS: 4.0000 mg | ORAL_TABLET | Freq: Three times a day (TID) | ORAL | 2 refills | Status: DC | PRN

## 2018-02-05 MED ORDER — HYDROCODONE-ACETAMINOPHEN 7.5-325 MG PO TABS: 1.0000 | ORAL_TABLET | Freq: Four times a day (QID) | ORAL | 0 refills | Status: DC | PRN
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
## 2018-02-06 ENCOUNTER — Telehealth: Payer: Self-pay

## 2018-02-06 DIAGNOSIS — N3281 Overactive bladder: Secondary | ICD-10-CM

## 2018-02-06 NOTE — Telephone Encounter (Signed)
Pts pharmacy sent fax to our office requesting a medication change for Oxybutynin CL ER 10mg  tablet. Pt can not afford this medication.

## 2018-02-08 MED ORDER — OXYBUTYNIN CHLORIDE 5 MG PO TABS: 5.0000 mg | ORAL_TABLET | Freq: Two times a day (BID) | ORAL | 1 refills | Status: DC

## 2018-02-08 NOTE — Addendum Note (Signed)
Addended byDickie La on: 02/08/2018 03:23 PM   Modules accepted: Orders

## 2018-02-08 NOTE — Telephone Encounter (Signed)
Pt contacted and informed of new rx sent to her pharmacy. New prescription details and directions were given.

## 2018-02-08 NOTE — Telephone Encounter (Signed)
[facility] The cost change is probably related to her insurance deductible so everything will be more expensive ntil they have met the deductible. I have sent a rx in for the non-extended relase form of that med--it may be cheaper. It is twice a day though Please let er know and explain  THANKS! Dorcas Mcmurray

## 2018-02-13 ENCOUNTER — Telehealth: Payer: Self-pay | Admitting: Registered Nurse

## 2018-02-13 NOTE — Telephone Encounter (Signed)
Placed a call to [name], she is aware the letter was sent to [employer], she verbalizes understanding.

## 2018-02-22 ENCOUNTER — Other Ambulatory Visit: Payer: Self-pay | Admitting: Registered Nurse

## 2018-02-27 ENCOUNTER — Other Ambulatory Visit: Payer: Self-pay

## 2018-02-28 MED ORDER — ALBUTEROL SULFATE HFA 108 (90 BASE) MCG/ACT IN AERS: INHALATION_SPRAY | RESPIRATORY_TRACT | 3 refills | Status: DC

## 2018-02-28 MED ORDER — CARVEDILOL 6.25 MG PO TABS: 9.3750 mg | ORAL_TABLET | Freq: Two times a day (BID) | ORAL | 0 refills | Status: DC

## 2018-02-28 MED ORDER — FLUTICASONE-SALMETEROL 250-50 MCG/DOSE IN AEPB: 1.0000 | INHALATION_SPRAY | Freq: Two times a day (BID) | RESPIRATORY_TRACT | 2 refills | Status: DC

## 2018-03-01 ENCOUNTER — Other Ambulatory Visit: Payer: Self-pay

## 2018-03-01 MED ORDER — OXYBUTYNIN CHLORIDE 5 MG PO TABS: 5.0000 mg | ORAL_TABLET | Freq: Two times a day (BID) | ORAL | 0 refills | Status: DC

## 2018-03-01 MED ORDER — LISINOPRIL 40 MG PO TABS: 40.0000 mg | ORAL_TABLET | Freq: Every day | ORAL | 0 refills | Status: DC

## 2018-03-01 MED ORDER — SPIRONOLACTONE 25 MG PO TABS: 25.0000 mg | ORAL_TABLET | Freq: Every day | ORAL | 0 refills | Status: DC

## 2018-03-05 ENCOUNTER — Other Ambulatory Visit: Payer: Self-pay

## 2018-03-05 ENCOUNTER — Encounter: Payer: Self-pay | Admitting: Registered Nurse

## 2018-03-05 ENCOUNTER — Encounter: Payer: No Typology Code available for payment source | Attending: Registered Nurse | Admitting: Registered Nurse

## 2018-03-05 VITALS — BP 133/86 | HR 73

## 2018-03-05 DIAGNOSIS — Z760 Encounter for issue of repeat prescription: Secondary | ICD-10-CM | POA: Diagnosis not present

## 2018-03-05 DIAGNOSIS — M5412 Radiculopathy, cervical region: Secondary | ICD-10-CM | POA: Insufficient documentation

## 2018-03-05 DIAGNOSIS — J45909 Unspecified asthma, uncomplicated: Secondary | ICD-10-CM | POA: Diagnosis not present

## 2018-03-05 DIAGNOSIS — I10 Essential (primary) hypertension: Secondary | ICD-10-CM | POA: Diagnosis not present

## 2018-03-05 DIAGNOSIS — G4700 Insomnia, unspecified: Secondary | ICD-10-CM | POA: Diagnosis not present

## 2018-03-05 DIAGNOSIS — M961 Postlaminectomy syndrome, not elsewhere classified: Secondary | ICD-10-CM | POA: Insufficient documentation

## 2018-03-05 DIAGNOSIS — F329 Major depressive disorder, single episode, unspecified: Secondary | ICD-10-CM | POA: Insufficient documentation

## 2018-03-05 DIAGNOSIS — G243 Spasmodic torticollis: Secondary | ICD-10-CM

## 2018-03-05 DIAGNOSIS — Z79891 Long term (current) use of opiate analgesic: Secondary | ICD-10-CM | POA: Diagnosis not present

## 2018-03-05 DIAGNOSIS — G4709 Other insomnia: Secondary | ICD-10-CM

## 2018-03-05 DIAGNOSIS — F419 Anxiety disorder, unspecified: Secondary | ICD-10-CM | POA: Insufficient documentation

## 2018-03-05 DIAGNOSIS — K5900 Constipation, unspecified: Secondary | ICD-10-CM | POA: Diagnosis not present

## 2018-03-05 DIAGNOSIS — M7918 Myalgia, other site: Secondary | ICD-10-CM

## 2018-03-05 DIAGNOSIS — G894 Chronic pain syndrome: Secondary | ICD-10-CM | POA: Diagnosis not present

## 2018-03-05 DIAGNOSIS — Z79899 Other long term (current) drug therapy: Secondary | ICD-10-CM | POA: Diagnosis not present

## 2018-03-05 DIAGNOSIS — G8928 Other chronic postprocedural pain: Secondary | ICD-10-CM | POA: Diagnosis not present

## 2018-03-05 DIAGNOSIS — G249 Dystonia, unspecified: Secondary | ICD-10-CM | POA: Insufficient documentation

## 2018-03-05 DIAGNOSIS — E876 Hypokalemia: Secondary | ICD-10-CM | POA: Insufficient documentation

## 2018-03-05 DIAGNOSIS — M4722 Other spondylosis with radiculopathy, cervical region: Secondary | ICD-10-CM

## 2018-03-05 DIAGNOSIS — Z5181 Encounter for therapeutic drug level monitoring: Secondary | ICD-10-CM

## 2018-03-05 MED ORDER — CARVEDILOL 6.25 MG PO TABS: 9.3750 mg | ORAL_TABLET | Freq: Two times a day (BID) | ORAL | 3 refills | Status: DC

## 2018-03-05 MED ORDER — ALBUTEROL SULFATE (2.5 MG/3ML) 0.083% IN NEBU: INHALATION_SOLUTION | RESPIRATORY_TRACT | 3 refills | Status: DC

## 2018-03-05 MED ORDER — FLUTICASONE-SALMETEROL 250-50 MCG/DOSE IN AEPB: 1.0000 | INHALATION_SPRAY | Freq: Two times a day (BID) | RESPIRATORY_TRACT | 3 refills | Status: DC

## 2018-03-05 MED ORDER — HYDROCODONE-ACETAMINOPHEN 7.5-325 MG PO TABS: 1.0000 | ORAL_TABLET | Freq: Four times a day (QID) | ORAL | 0 refills | Status: DC | PRN

## 2018-03-05 NOTE — Telephone Encounter (Signed)
[facility] called for refills on:\ Albuterol neb Carvedilol Fluticasone  Danley Danker, RN [facility] RN)
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
## 2018-03-10 LAB — TOXASSURE SELECT,+ANTIDEPR,UR

## 2018-03-13 ENCOUNTER — Telehealth: Payer: Self-pay | Admitting: *Deleted

## 2018-03-13 NOTE — Telephone Encounter (Signed)
Urine drug screen for this encounter is consistent for prescribed medication 

## 2018-03-21 ENCOUNTER — Other Ambulatory Visit: Payer: Self-pay | Admitting: Physical Medicine & Rehabilitation

## 2018-04-02 ENCOUNTER — Other Ambulatory Visit: Payer: Self-pay

## 2018-04-02 ENCOUNTER — Encounter: Payer: Self-pay | Admitting: Physical Medicine & Rehabilitation

## 2018-04-02 ENCOUNTER — Ambulatory Visit: Payer: No Typology Code available for payment source | Admitting: Physical Medicine & Rehabilitation

## 2018-04-02 ENCOUNTER — Telehealth: Payer: Self-pay | Admitting: *Deleted

## 2018-04-02 MED ORDER — TIZANIDINE HCL 4 MG PO TABS: 4.0000 mg | ORAL_TABLET | Freq: Three times a day (TID) | ORAL | 2 refills | Status: DC | PRN

## 2018-04-02 MED ORDER — HYDROCODONE-ACETAMINOPHEN 7.5-325 MG PO TABS: 1.0000 | ORAL_TABLET | Freq: Four times a day (QID) | ORAL | 0 refills | Status: DC | PRN

## 2018-04-02 NOTE — Telephone Encounter (Signed)
[name] Hydrocodone e-scribe.  [name] last prescription was picked up on 03/09/2018.

## 2018-04-02 NOTE — Telephone Encounter (Signed)
Unable to have Dysport today. Maria Griffith came in for visit but BP is elevated 169/`08, 170/108, 168/110.  We have cancelled her appt and she will reschedule. I have instructed her to contact her PCP and follow up on her BP with him.  She says she believes she has taken her medications lisinopril and spironolactone. She says she was taken off of her coreg.  Her PCP is Dr Gwendlyn Deutscher with [facility].  We will refill her  Hydrocodone 7.5/325 (last filled 03/09/18 # 120)  Today she had #25  She requested refills on trazodone tizanidine, lyrica and senna, but there are refills available. I called her back on her mobile number to let her know about the medications that show refills available and reminded her to please follow up with her PCP.  She has had pressures elevated in the past similar to today's numbers but it is not typical.

## 2018-04-25 ENCOUNTER — Encounter: Payer: Self-pay | Admitting: Physical Medicine & Rehabilitation

## 2018-04-25 ENCOUNTER — Encounter: Payer: No Typology Code available for payment source | Attending: Registered Nurse

## 2018-04-25 ENCOUNTER — Ambulatory Visit (HOSPITAL_BASED_OUTPATIENT_CLINIC_OR_DEPARTMENT_OTHER): Payer: No Typology Code available for payment source | Admitting: Physical Medicine & Rehabilitation

## 2018-04-25 VITALS — BP 137/93 | HR 73 | Ht 62.0 in | Wt 184.4 lb

## 2018-04-25 DIAGNOSIS — F419 Anxiety disorder, unspecified: Secondary | ICD-10-CM | POA: Insufficient documentation

## 2018-04-25 DIAGNOSIS — G249 Dystonia, unspecified: Secondary | ICD-10-CM | POA: Insufficient documentation

## 2018-04-25 DIAGNOSIS — M5412 Radiculopathy, cervical region: Secondary | ICD-10-CM | POA: Insufficient documentation

## 2018-04-25 DIAGNOSIS — I10 Essential (primary) hypertension: Secondary | ICD-10-CM | POA: Insufficient documentation

## 2018-04-25 DIAGNOSIS — G8928 Other chronic postprocedural pain: Secondary | ICD-10-CM | POA: Diagnosis not present

## 2018-04-25 DIAGNOSIS — G243 Spasmodic torticollis: Secondary | ICD-10-CM | POA: Diagnosis not present

## 2018-04-25 DIAGNOSIS — J45909 Unspecified asthma, uncomplicated: Secondary | ICD-10-CM | POA: Insufficient documentation

## 2018-04-25 DIAGNOSIS — K5900 Constipation, unspecified: Secondary | ICD-10-CM | POA: Insufficient documentation

## 2018-04-25 DIAGNOSIS — F329 Major depressive disorder, single episode, unspecified: Secondary | ICD-10-CM | POA: Insufficient documentation

## 2018-04-25 DIAGNOSIS — Z760 Encounter for issue of repeat prescription: Secondary | ICD-10-CM | POA: Insufficient documentation

## 2018-04-25 DIAGNOSIS — M62838 Other muscle spasm: Secondary | ICD-10-CM | POA: Insufficient documentation

## 2018-04-25 DIAGNOSIS — G4700 Insomnia, unspecified: Secondary | ICD-10-CM | POA: Insufficient documentation

## 2018-04-25 DIAGNOSIS — M961 Postlaminectomy syndrome, not elsewhere classified: Secondary | ICD-10-CM | POA: Diagnosis not present

## 2018-04-25 MED ORDER — HYDROCODONE-ACETAMINOPHEN 7.5-325 MG PO TABS: 1.0000 | ORAL_TABLET | Freq: Four times a day (QID) | ORAL | 0 refills | Status: DC | PRN

## 2018-04-25 NOTE — Progress Notes (Signed)
   Procedure: Dysport injection Diagnosis: Cervical dystonia G 24.3 Dilution: 500 units in 1 mL sterile preservative-free normal saline  Informed consent was obtained after describing risks and benefits of the procedure with patient including bleeding bruising infection as well as the potential side effects of the medication itself. Patient elects to proceed and has given written consent.  Patient placed in a seated position Areas were marked and prepped with Betadine  Needle: 27-gauge 1 inch needle electrode connected to EMG amplifier   Left trapezius: 50 units Left longissimus: 100 units Left splenius capitis: 100 units Lef Levator scapula: 50  All injections done after negative drawback for blood. Appropriate EMG activity.  Next visit 3-4 mo for repeat with 300 U dysport  Nurse practitioner visit in 1 month to refill narcotic analgesic medication and check for compliance.

## 2018-04-25 NOTE — Patient Instructions (Signed)

## 2018-05-06 ENCOUNTER — Other Ambulatory Visit: Payer: Self-pay | Admitting: Family Medicine

## 2018-05-07 ENCOUNTER — Encounter: Payer: Self-pay | Admitting: Family Medicine

## 2018-05-07 NOTE — Progress Notes (Signed)
Patient scheduled appt for 05-14-18 and would like to have UA to check for uti.  Porfirio Bollier,CMA

## 2018-05-07 NOTE — Progress Notes (Signed)
Patient need follow-up for BP management and health maintenance. Please call her to schedule follow-up appointment with me. Thanks.

## 2018-05-14 ENCOUNTER — Telehealth: Payer: Self-pay | Admitting: Physical Medicine & Rehabilitation

## 2018-05-14 ENCOUNTER — Encounter: Payer: Self-pay | Admitting: Family Medicine

## 2018-05-14 ENCOUNTER — Other Ambulatory Visit: Payer: Self-pay

## 2018-05-14 ENCOUNTER — Ambulatory Visit (INDEPENDENT_AMBULATORY_CARE_PROVIDER_SITE_OTHER): Payer: Medicare HMO | Admitting: Family Medicine

## 2018-05-14 ENCOUNTER — Other Ambulatory Visit (HOSPITAL_COMMUNITY)
Admission: RE | Admit: 2018-05-14 | Discharge: 2018-05-14 | Disposition: A | Discharge status: Home / Self Care | Payer: Medicare HMO | Source: Ambulatory Visit | Attending: Family Medicine | Admitting: Family Medicine

## 2018-05-14 VITALS — BP 126/74 | HR 76 | Temp 98.2°F | Ht 62.0 in | Wt 185.0 lb

## 2018-05-14 DIAGNOSIS — N898 Other specified noninflammatory disorders of vagina: Secondary | ICD-10-CM | POA: Diagnosis not present

## 2018-05-14 DIAGNOSIS — I10 Essential (primary) hypertension: Secondary | ICD-10-CM | POA: Diagnosis not present

## 2018-05-14 DIAGNOSIS — Z124 Encounter for screening for malignant neoplasm of cervix: Secondary | ICD-10-CM | POA: Insufficient documentation

## 2018-05-14 DIAGNOSIS — I77810 Thoracic aortic ectasia: Secondary | ICD-10-CM | POA: Diagnosis not present

## 2018-05-14 DIAGNOSIS — I77819 Aortic ectasia, unspecified site: Secondary | ICD-10-CM | POA: Insufficient documentation

## 2018-05-14 DIAGNOSIS — R87618 Other abnormal cytological findings on specimens from cervix uteri: Secondary | ICD-10-CM

## 2018-05-14 DIAGNOSIS — Z202 Contact with and (suspected) exposure to infections with a predominantly sexual mode of transmission: Secondary | ICD-10-CM | POA: Diagnosis not present

## 2018-05-14 DIAGNOSIS — R5383 Other fatigue: Secondary | ICD-10-CM

## 2018-05-14 DIAGNOSIS — I351 Nonrheumatic aortic (valve) insufficiency: Secondary | ICD-10-CM | POA: Diagnosis not present

## 2018-05-14 LAB — POCT WET PREP (WET MOUNT)
Clue Cells Wet Prep Whiff POC: NEGATIVE
TRICHOMONAS WET PREP HPF POC: ABSENT

## 2018-05-14 NOTE — Assessment & Plan Note (Signed)
PAP repeated today. I will call with result.

## 2018-05-14 NOTE — Assessment & Plan Note (Addendum)
Wet prep completed and it's normal. I called her with result. STD screening done. I will contact her with result.

## 2018-05-14 NOTE — Patient Instructions (Signed)

## 2018-05-14 NOTE — Assessment & Plan Note (Signed)
Etiology unclear.  R/O metabolic or endocrine disorder. Cardiac a possibility. Repeat ECHO. Lab drawn. I will contact with result.
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
## 2018-05-14 NOTE — Assessment & Plan Note (Signed)
ECHO: Compared to a prior study in 2016, the LVEF is higher at 60-65%.   There is now moderate LVH. Mild AI persists. The ascending aorta measures 4.2 cm. Previous mild AI.  ?? Related to her fatigue. ECHO repeated.  ED visit if symptomatic. Currently appears stable.

## 2018-05-14 NOTE — Telephone Encounter (Signed)
Pt has hearing on Friday May 31, needs rx signed by MD for: [place] wraps for cervical & lumbar area  Ordered on Feb 13, 2018.  Please fax to # provided...  Thanks, Vilinda Blanks

## 2018-05-14 NOTE — Assessment & Plan Note (Signed)
BP looks good on current regimen. Checked Bmet and FLP

## 2018-05-15 ENCOUNTER — Telehealth: Payer: Self-pay | Admitting: Family Medicine

## 2018-05-15 LAB — LIPID PANEL
CHOLESTEROL TOTAL: 145 mg/dL (ref 100–199)
Chol/HDL Ratio: 3.5 ratio (ref 0.0–4.4)
HDL: 41 mg/dL (ref >39)
LDL CALC: 83 mg/dL (ref 0–99)
TRIGLYCERIDES: 106 mg/dL (ref 0–149)
VLDL Cholesterol Cal: 21 mg/dL (ref 5–40)

## 2018-05-15 LAB — BASIC METABOLIC PANEL
BUN/Creatinine Ratio: 18 (ref 9–23)
BUN: 20 mg/dL (ref 6–24)
CALCIUM: 9 mg/dL (ref 8.7–10.2)
CO2: 24 mmol/L (ref 20–29)
CREATININE: 1.11 mg/dL — AB (ref 0.57–1.00)
Chloride: 104 mmol/L (ref 96–106)
GFR calc Af Amer: 65 mL/min/{1.73_m2} (ref >59)
GFR calc non Af Amer: 56 mL/min/{1.73_m2} — ABNORMAL LOW (ref >59)
GLUCOSE: 90 mg/dL (ref 65–99)
Potassium: 4.5 mmol/L (ref 3.5–5.2)
SODIUM: 142 mmol/L (ref 134–144)

## 2018-05-15 LAB — CBC WITH DIFFERENTIAL/PLATELET
Basophils Absolute: 0 10*3/uL (ref 0.0–0.2)
Basos: 1 %
EOS (ABSOLUTE): 0.1 10*3/uL (ref 0.0–0.4)
EOS: 4 %
HEMOGLOBIN: 11.9 g/dL (ref 11.1–15.9)
Hematocrit: 35.3 % (ref 34.0–46.6)
IMMATURE GRANS (ABS): 0 10*3/uL (ref 0.0–0.1)
Immature Granulocytes: 0 %
LYMPHS: 52 %
Lymphocytes Absolute: 1.2 10*3/uL (ref 0.7–3.1)
MCH: 28.9 pg (ref 26.6–33.0)
MCHC: 33.7 g/dL (ref 31.5–35.7)
MCV: 86 fL (ref 79–97)
MONOCYTES: 8 %
Monocytes Absolute: 0.2 10*3/uL (ref 0.1–0.9)
NEUTROS ABS: 0.8 10*3/uL — AB (ref 1.4–7.0)
Neutrophils: 35 %
Platelets: 263 10*3/uL (ref 150–450)
RBC: 4.12 x10E6/uL (ref 3.77–5.28)
RDW: 14.6 % (ref 12.3–15.4)
WBC: 2.3 10*3/uL — AB (ref 3.4–10.8)

## 2018-05-15 LAB — HIV ANTIBODY (ROUTINE TESTING W REFLEX): HIV Screen 4th Generation wRfx: NONREACTIVE

## 2018-05-15 LAB — VITAMIN B12: VITAMIN B 12: 551 pg/mL (ref 232–1245)

## 2018-05-15 LAB — TSH: TSH: 0.944 u[IU]/mL (ref 0.450–4.500)

## 2018-05-15 LAB — RPR: RPR Ser Ql: NONREACTIVE

## 2018-05-15 LAB — VITAMIN D 25 HYDROXY (VIT D DEFICIENCY, FRACTURES): VIT D 25 HYDROXY: 21.5 ng/mL — AB (ref 30.0–100.0)

## 2018-05-15 NOTE — Telephone Encounter (Signed)
I called and discussed test result at length with patient.  1. Neutropenia: Could be due to Spironolactone.     For her BP she is on Lisinopril 40 mg qd and Spironolactone. Her Coreg was d/c by Cardiologist last year.  She is uncertain how long she had been on the spironolactone but it seems she started sometime last year which was when her WBC level started dropping.  This could be contributing to her fatigue.   Advice: D/C Spironolactone for now. Continue Lisinopril. Continue BP monitoring <140/80 goal, if elevated she will call. F/U in 2 weeks for BP check and repeat lab. I will get blood smear then. If still low with abnormal blood smear, I will refer her to hematologist/oncologist. She agreed with plan.  2. Vitamin D is slightly low ( Insufficient). Start OTC Vitamin D supplement. She verbalized understanding.  3. Mild AKI. We will monitor.  4. Other test results/STD screening still pending at this time.

## 2018-05-16 ENCOUNTER — Telehealth: Payer: Self-pay | Admitting: Family Medicine

## 2018-05-16 LAB — CYTOLOGY - PAP
CHLAMYDIA, DNA PROBE: NEGATIVE
Diagnosis: NEGATIVE
HPV (WINDOPATH): NOT DETECTED
NEISSERIA GONORRHEA: NEGATIVE
Trichomonas: NEGATIVE

## 2018-05-16 NOTE — Telephone Encounter (Signed)
HIPPA compliant call back message left.   Note: GC/Chlamydia are negative PAP is normal. She need repeat in 1 yr.

## 2018-05-16 NOTE — Telephone Encounter (Signed)
Deidre Ala with [employer] wanted script for [employer] and [employer] faxed to him at [phone].  I have reprinted letter that Zella Ball has typed up and sent that along with scripts that were wrote for the above mentioned.

## 2018-05-23 ENCOUNTER — Encounter: Payer: Self-pay | Admitting: Registered Nurse

## 2018-05-23 ENCOUNTER — Encounter: Payer: No Typology Code available for payment source | Attending: Registered Nurse | Admitting: Registered Nurse

## 2018-05-23 ENCOUNTER — Other Ambulatory Visit: Payer: Self-pay

## 2018-05-23 VITALS — BP 121/82 | HR 71 | Ht 61.0 in | Wt 188.2 lb

## 2018-05-23 DIAGNOSIS — M4722 Other spondylosis with radiculopathy, cervical region: Secondary | ICD-10-CM | POA: Diagnosis not present

## 2018-05-23 DIAGNOSIS — M961 Postlaminectomy syndrome, not elsewhere classified: Secondary | ICD-10-CM | POA: Insufficient documentation

## 2018-05-23 DIAGNOSIS — Z79891 Long term (current) use of opiate analgesic: Secondary | ICD-10-CM | POA: Diagnosis not present

## 2018-05-23 DIAGNOSIS — F329 Major depressive disorder, single episode, unspecified: Secondary | ICD-10-CM | POA: Diagnosis not present

## 2018-05-23 DIAGNOSIS — R202 Paresthesia of skin: Secondary | ICD-10-CM | POA: Insufficient documentation

## 2018-05-23 DIAGNOSIS — G249 Dystonia, unspecified: Secondary | ICD-10-CM | POA: Diagnosis not present

## 2018-05-23 DIAGNOSIS — Z8249 Family history of ischemic heart disease and other diseases of the circulatory system: Secondary | ICD-10-CM | POA: Insufficient documentation

## 2018-05-23 DIAGNOSIS — Z981 Arthrodesis status: Secondary | ICD-10-CM | POA: Insufficient documentation

## 2018-05-23 DIAGNOSIS — M25512 Pain in left shoulder: Secondary | ICD-10-CM | POA: Insufficient documentation

## 2018-05-23 DIAGNOSIS — G4709 Other insomnia: Secondary | ICD-10-CM

## 2018-05-23 DIAGNOSIS — G8929 Other chronic pain: Secondary | ICD-10-CM | POA: Diagnosis not present

## 2018-05-23 DIAGNOSIS — Z760 Encounter for issue of repeat prescription: Secondary | ICD-10-CM | POA: Diagnosis present

## 2018-05-23 DIAGNOSIS — G4700 Insomnia, unspecified: Secondary | ICD-10-CM | POA: Diagnosis not present

## 2018-05-23 DIAGNOSIS — G5602 Carpal tunnel syndrome, left upper limb: Secondary | ICD-10-CM | POA: Diagnosis not present

## 2018-05-23 DIAGNOSIS — Z5181 Encounter for therapeutic drug level monitoring: Secondary | ICD-10-CM

## 2018-05-23 DIAGNOSIS — G8928 Other chronic postprocedural pain: Secondary | ICD-10-CM | POA: Insufficient documentation

## 2018-05-23 DIAGNOSIS — F419 Anxiety disorder, unspecified: Secondary | ICD-10-CM | POA: Insufficient documentation

## 2018-05-23 DIAGNOSIS — M7918 Myalgia, other site: Secondary | ICD-10-CM | POA: Insufficient documentation

## 2018-05-23 DIAGNOSIS — Z79899 Other long term (current) drug therapy: Secondary | ICD-10-CM | POA: Diagnosis not present

## 2018-05-23 DIAGNOSIS — K5900 Constipation, unspecified: Secondary | ICD-10-CM | POA: Diagnosis not present

## 2018-05-23 DIAGNOSIS — G243 Spasmodic torticollis: Secondary | ICD-10-CM

## 2018-05-23 DIAGNOSIS — M5412 Radiculopathy, cervical region: Secondary | ICD-10-CM | POA: Insufficient documentation

## 2018-05-23 DIAGNOSIS — G894 Chronic pain syndrome: Secondary | ICD-10-CM

## 2018-05-23 MED ORDER — HYDROCODONE-ACETAMINOPHEN 7.5-325 MG PO TABS: 1.0000 | ORAL_TABLET | Freq: Four times a day (QID) | ORAL | 0 refills | Status: DC | PRN
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
## 2018-05-24 ENCOUNTER — Other Ambulatory Visit: Payer: Self-pay | Admitting: Registered Nurse

## 2018-05-27 ENCOUNTER — Ambulatory Visit (HOSPITAL_COMMUNITY)
Admission: RE | Admit: 2018-05-27 | Discharge: 2018-05-27 | Disposition: A | Discharge status: Home / Self Care | Payer: Medicare HMO | Source: Ambulatory Visit | Attending: Family Medicine | Admitting: Family Medicine

## 2018-05-27 DIAGNOSIS — I351 Nonrheumatic aortic (valve) insufficiency: Secondary | ICD-10-CM | POA: Diagnosis not present

## 2018-05-27 DIAGNOSIS — R5383 Other fatigue: Secondary | ICD-10-CM

## 2018-05-27 DIAGNOSIS — I77810 Thoracic aortic ectasia: Secondary | ICD-10-CM | POA: Diagnosis not present

## 2018-05-27 DIAGNOSIS — I10 Essential (primary) hypertension: Secondary | ICD-10-CM | POA: Insufficient documentation

## 2018-05-27 DIAGNOSIS — I088 Other rheumatic multiple valve diseases: Secondary | ICD-10-CM | POA: Insufficient documentation

## 2018-05-27 NOTE — Progress Notes (Signed)
  Echocardiogram 2D Echocardiogram has been performed.  Maria Griffith M 05/27/2018, 9:47 AM

## 2018-05-28 ENCOUNTER — Ambulatory Visit: Payer: Self-pay | Admitting: Family Medicine

## 2018-05-30 ENCOUNTER — Telehealth: Payer: Self-pay | Admitting: *Deleted

## 2018-05-30 LAB — TOXASSURE SELECT,+ANTIDEPR,UR

## 2018-05-30 NOTE — Telephone Encounter (Signed)
Urine drug screen for this encounter is consistent for prescribed medication 

## 2018-05-31 ENCOUNTER — Ambulatory Visit: Payer: Medicare HMO | Admitting: Family Medicine

## 2018-06-03 ENCOUNTER — Other Ambulatory Visit: Payer: Self-pay | Admitting: Registered Nurse

## 2018-06-04 ENCOUNTER — Encounter: Payer: Self-pay | Admitting: Family Medicine

## 2018-06-04 ENCOUNTER — Ambulatory Visit (INDEPENDENT_AMBULATORY_CARE_PROVIDER_SITE_OTHER): Payer: Medicare HMO | Admitting: Family Medicine

## 2018-06-04 ENCOUNTER — Other Ambulatory Visit: Payer: Self-pay

## 2018-06-04 VITALS — BP 134/84 | HR 63 | Temp 97.8°F | Ht 61.0 in | Wt 188.0 lb

## 2018-06-04 DIAGNOSIS — I10 Essential (primary) hypertension: Secondary | ICD-10-CM

## 2018-06-04 DIAGNOSIS — Z87891 Personal history of nicotine dependence: Secondary | ICD-10-CM

## 2018-06-04 DIAGNOSIS — I77819 Aortic ectasia, unspecified site: Secondary | ICD-10-CM | POA: Diagnosis not present

## 2018-06-04 DIAGNOSIS — D709 Neutropenia, unspecified: Secondary | ICD-10-CM | POA: Insufficient documentation

## 2018-06-04 DIAGNOSIS — D702 Other drug-induced agranulocytosis: Secondary | ICD-10-CM

## 2018-06-04 MED ORDER — FLUTICASONE-SALMETEROL 250-50 MCG/DOSE IN AEPB: 1.0000 | INHALATION_SPRAY | Freq: Two times a day (BID) | RESPIRATORY_TRACT | 1 refills | Status: DC

## 2018-06-04 MED ORDER — ALBUTEROL SULFATE HFA 108 (90 BASE) MCG/ACT IN AERS: INHALATION_SPRAY | RESPIRATORY_TRACT | 1 refills | Status: DC

## 2018-06-04 MED ORDER — ASPIRIN EC 81 MG PO TBEC: 81.0000 mg | DELAYED_RELEASE_TABLET | Freq: Every day | ORAL | 1 refills | Status: DC

## 2018-06-04 MED ORDER — LISINOPRIL 40 MG PO TABS: 40.0000 mg | ORAL_TABLET | Freq: Every day | ORAL | 1 refills | Status: DC

## 2018-06-04 MED ORDER — OXYBUTYNIN CHLORIDE 5 MG PO TABS: 5.0000 mg | ORAL_TABLET | Freq: Two times a day (BID) | ORAL | 1 refills | Status: DC

## 2018-06-04 MED ORDER — LORATADINE 10 MG PO TABS: 10.0000 mg | ORAL_TABLET | Freq: Every day | ORAL | 1 refills | Status: DC | PRN

## 2018-06-04 MED ORDER — ALBUTEROL SULFATE (2.5 MG/3ML) 0.083% IN NEBU: INHALATION_SOLUTION | RESPIRATORY_TRACT | 1 refills | Status: DC

## 2018-06-04 NOTE — Assessment & Plan Note (Signed)
BP looks great on Lisinopril. Continue to hold Aldactone.

## 2018-06-04 NOTE — Assessment & Plan Note (Signed)
Likely due to aldactone. Off meds now. Here for follow-up. CBC and blood smear checked. If abnormal, I will refer her to a hematologist.

## 2018-06-04 NOTE — Patient Instructions (Signed)
Neutropenia Neutropenia is a condition that occurs when you have a lower-than-normal level of a type of white blood cell (neutrophil) in your body. Neutrophils are made in the spongy center of large bones (bone marrow) and they fight infections. Neutrophils are your body's main defense against bacterial and fungal infections. The fewer neutrophils you have and the longer your body remains without them, the greater your risk of getting a severe infection. What are the causes? This condition can occur if your body uses up or destroys neutrophils faster than your bone marrow can make them. This problem may happen because of:  Bacterial or fungal infection.  Allergic disorders.  Reactions to some medicines.  Autoimmune disease.  An enlarged spleen.  This condition can also occur if your bone marrow does not produce enough neutrophils. This problem may be caused by:  Cancer.  Cancer treatments, such as radiation or chemotherapy.  Viral infections.  Medicines, such as phenytoin.  Vitamin B12 deficiency.  Diseases of the bone marrow.  Environmental toxins, such as insecticides.  What are the signs or symptoms? This condition does not usually cause symptoms. If symptoms are present, they are usually caused by an underlying infection. Symptoms of an infection may include:  Fever.  Chills.  Swollen glands.  Oral or anal ulcers.  Cough and shortness of breath.  Rash.  Skin infection.  Fatigue.  How is this diagnosed? Your health care provider may suspect neutropenia if you have:  A condition that may cause neutropenia.  Symptoms of infection, especially fever.  Frequent and unusual infections.  You will have a medical history and physical exam. Tests will also be done, such as:  A complete blood count (CBC).  A procedure to collect a sample of bone marrow for examination (bone marrow biopsy).  A chest X-ray.  A urine culture.  A blood culture.  How is this  treated? Treatment depends on the underlying cause and severity of your condition. Mild neutropenia may not require treatment. Treatment may include medicines, such as:  Antibiotic medicine given through an IV tube.  Antiviral medicines.  Antifungal medicines.  A medicine to increase neutrophil production (colony-stimulating factor). You may get this drug through an IV tube or by injection.  Steroids given through an IV tube.  If an underlying condition is causing neutropenia, you may need treatment for that condition. If medicines you are taking are causing neutropenia, your health care provider may have you stop taking those medicines. Follow these instructions at home: Medicines  Take over-the-counter and prescription medicines only as told by your health care provider.  Get a seasonal flu shot (influenza vaccine). Lifestyle  Do not eat unpasteurized foods.Do not eat unwashed raw fruits or vegetables.  Avoid exposure to groups of people or children.  Avoid being around people who are sick.  Avoid being around dirt or dust, such as in construction areas or gardens.  Do not provide direct care for pets. Avoid animal droppings. Do not clean litter boxes and bird cages. Hygiene   Bathe daily.  Clean the area between the genitals and the anus (perineal area) after you urinate or have a bowel movement. If you are female, wipe from front to back.  Brush your teeth with a soft toothbrush before and after meals.  Do not use a razor that has a blade. Use an electric razor to remove hair.  Wash your hands often. Make sure others who come in contact with you also wash their hands. If soap and water  are not available, use hand sanitizer. General instructions  Do not have sex unless your health care provider has approved.  Take actions to avoid cuts and burns. For example: ? Be cautious when you use knives. Always cut away from yourself. ? Keep knives in protective sheaths or  guards when not in use. ? Use oven mitts when you cook with a hot stove, oven, or grill. ? Stand a safe distance away from open fires.  Avoid people who received a vaccine in the past 30 days if that vaccine contained a live version of the germ (live vaccine). You should not get a live vaccine. Common live vaccines are varicella, measles, mumps, and rubella.  Do not share food utensils.  Do not use tampons, enemas, or rectal suppositories unless your health care provider has approved.  Keep all appointments as told by your health care provider. This is important. Contact a health care provider if:  You have a fever.  You have chills or you start to shake.  You have: ? A sore throat. ? A warm, red, or tender area on your skin. ? A cough. ? Frequent or painful urination. ? Vaginal discharge or itching.  You develop: ? Sores in your mouth or anus. ? Swollen lymph nodes. ? Red streaks on the skin. ? A rash.  You feel: ? Nauseous or you vomit. ? Very fatigued. ? Short of breath. This information is not intended to replace advice given to you by your health care provider. Make sure you discuss any questions you have with your health care provider. Document Released: 05/26/2002 Document Revised: 05/11/2016 Document Reviewed: 06/16/2015 Elsevier Interactive Patient Education  2018 [employer]  
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
## 2018-06-04 NOTE — Assessment & Plan Note (Signed)
ECHO result equivocal. AAA vascular US recommended vs monitor and on ASA. She prefers to proceed with test. [place] ordered.  Continue ASA daily.

## 2018-06-05 ENCOUNTER — Telehealth: Payer: Self-pay | Admitting: Family Medicine

## 2018-06-05 DIAGNOSIS — Z13228 Encounter for screening for other metabolic disorders: Secondary | ICD-10-CM

## 2018-06-05 DIAGNOSIS — Z114 Encounter for screening for human immunodeficiency virus [HIV]: Secondary | ICD-10-CM

## 2018-06-05 DIAGNOSIS — D709 Neutropenia, unspecified: Secondary | ICD-10-CM

## 2018-06-05 DIAGNOSIS — E8889 Other specified metabolic disorders: Secondary | ICD-10-CM

## 2018-06-05 LAB — CBC WITH DIFFERENTIAL/PLATELET
Basophils Absolute: 0 10*3/uL (ref 0.0–0.2)
Basos: 1 %
EOS (ABSOLUTE): 0.1 10*3/uL (ref 0.0–0.4)
EOS: 4 %
HEMOGLOBIN: 11.5 g/dL (ref 11.1–15.9)
Hematocrit: 34.1 % (ref 34.0–46.6)
Immature Grans (Abs): 0 10*3/uL (ref 0.0–0.1)
Immature Granulocytes: 0 %
LYMPHS ABS: 1.4 10*3/uL (ref 0.7–3.1)
Lymphs: 53 %
MCH: 29 pg (ref 26.6–33.0)
MCHC: 33.7 g/dL (ref 31.5–35.7)
MCV: 86 fL (ref 79–97)
MONOCYTES: 9 %
Monocytes Absolute: 0.2 10*3/uL (ref 0.1–0.9)
Neutrophils Absolute: 0.8 10*3/uL — ABNORMAL LOW (ref 1.4–7.0)
Neutrophils: 33 %
Platelets: 218 10*3/uL (ref 150–450)
RBC: 3.97 x10E6/uL (ref 3.77–5.28)
RDW: 14 % (ref 12.3–15.4)
WBC: 2.5 10*3/uL — AB (ref 3.4–10.8)

## 2018-06-05 NOTE — Telephone Encounter (Signed)
Patient informed of white cell count being low and lab appt scheduled for 06-10-18. Maria Griffith,CMA

## 2018-06-05 NOTE — Telephone Encounter (Signed)
HIPPA compliant call back message left.   Note: When she calls back, please let her know that her white cell count is still low and I have placed referral to the hematologist I.e the blood doctor.

## 2018-06-07 ENCOUNTER — Encounter: Payer: Self-pay | Admitting: Hematology

## 2018-06-07 ENCOUNTER — Telehealth: Payer: Self-pay | Admitting: Hematology

## 2018-06-07 ENCOUNTER — Telehealth: Payer: Self-pay | Admitting: Family Medicine

## 2018-06-07 LAB — PATHOLOGIST SMEAR REVIEW
BASOS ABS: 0 10*3/uL (ref 0.0–0.2)
Basos: 0 %
EOS (ABSOLUTE): 0.1 10*3/uL (ref 0.0–0.4)
Eos: 4 %
HEMATOCRIT: 33.9 % — AB (ref 34.0–46.6)
HEMOGLOBIN: 11.4 g/dL (ref 11.1–15.9)
Immature Grans (Abs): 0 10*3/uL (ref 0.0–0.1)
Immature Granulocytes: 0 %
LYMPHS ABS: 1.5 10*3/uL (ref 0.7–3.1)
Lymphs: 56 %
MCH: 28.8 pg (ref 26.6–33.0)
MCHC: 33.6 g/dL (ref 31.5–35.7)
MCV: 86 fL (ref 79–97)
MONOCYTES: 10 %
MONOS ABS: 0.3 10*3/uL (ref 0.1–0.9)
Neutrophils Absolute: 0.8 10*3/uL — ABNORMAL LOW (ref 1.4–7.0)
Neutrophils: 30 %
PATH REV RBC: NORMAL
PLATELETS: 199 10*3/uL (ref 150–450)
Path Rev PLTs: NORMAL
RBC: 3.96 x10E6/uL (ref 3.77–5.28)
RDW: 14.1 % (ref 12.3–15.4)
WBC: 2.6 10*3/uL — AB (ref 3.4–10.8)

## 2018-06-07 NOTE — Telephone Encounter (Signed)
I discussed result with her. I gave her the phone number to the cancer center. They did call her to schedule appointment before.

## 2018-06-07 NOTE — Addendum Note (Signed)
Addended by: Andrena Mews T on: 06/07/2018 09:18 AM   Modules accepted: Orders

## 2018-06-07 NOTE — Telephone Encounter (Signed)
New patient referral from Dr. Gwendlyn Deutscher for dx of neutropenia. Pt has been scheduled to see Dr. Irene Limbo on 7/10 at 10am. Pt aware to arrive 30 minutes early. Letter mailed.

## 2018-06-10 ENCOUNTER — Other Ambulatory Visit: Payer: Medicare HMO

## 2018-06-10 DIAGNOSIS — R7989 Other specified abnormal findings of blood chemistry: Secondary | ICD-10-CM | POA: Diagnosis not present

## 2018-06-10 DIAGNOSIS — I10 Essential (primary) hypertension: Secondary | ICD-10-CM | POA: Diagnosis not present

## 2018-06-10 DIAGNOSIS — R5383 Other fatigue: Secondary | ICD-10-CM

## 2018-06-10 DIAGNOSIS — D709 Neutropenia, unspecified: Secondary | ICD-10-CM

## 2018-06-10 DIAGNOSIS — Z114 Encounter for screening for human immunodeficiency virus [HIV]: Secondary | ICD-10-CM | POA: Diagnosis not present

## 2018-06-10 DIAGNOSIS — E8889 Other specified metabolic disorders: Secondary | ICD-10-CM | POA: Diagnosis not present

## 2018-06-11 LAB — ACUTE HEP PANEL AND HEP B SURFACE AB
HEP A IGM: NEGATIVE
HEP B C IGM: NEGATIVE
HEP B S AG: NEGATIVE

## 2018-06-11 LAB — CMP14+EGFR
A/G RATIO: 1.6 (ref 1.2–2.2)
ALK PHOS: 66 IU/L (ref 39–117)
ALT: 19 IU/L (ref 0–32)
AST: 22 IU/L (ref 0–40)
Albumin: 4 g/dL (ref 3.5–5.5)
BILIRUBIN TOTAL: 0.3 mg/dL (ref 0.0–1.2)
BUN/Creatinine Ratio: 16 (ref 9–23)
BUN: 16 mg/dL (ref 6–24)
CHLORIDE: 105 mmol/L (ref 96–106)
CO2: 23 mmol/L (ref 20–29)
Calcium: 9.1 mg/dL (ref 8.7–10.2)
Creatinine, Ser: 0.97 mg/dL (ref 0.57–1.00)
GFR calc Af Amer: 76 mL/min/{1.73_m2} (ref >59)
GFR calc non Af Amer: 66 mL/min/{1.73_m2} (ref >59)
Globulin, Total: 2.5 g/dL (ref 1.5–4.5)
Glucose: 95 mg/dL (ref 65–99)
Potassium: 4.1 mmol/L (ref 3.5–5.2)
Sodium: 142 mmol/L (ref 134–144)
Total Protein: 6.5 g/dL (ref 6.0–8.5)

## 2018-06-11 LAB — FOLATE: Folate: 19.6 ng/mL (ref >3.0)

## 2018-06-12 ENCOUNTER — Ambulatory Visit (HOSPITAL_COMMUNITY)
Admission: RE | Admit: 2018-06-12 | Discharge: 2018-06-12 | Disposition: A | Discharge status: Home / Self Care | Payer: Medicare HMO | Source: Ambulatory Visit | Attending: Vascular Surgery | Admitting: Vascular Surgery

## 2018-06-12 DIAGNOSIS — I77819 Aortic ectasia, unspecified site: Secondary | ICD-10-CM | POA: Insufficient documentation

## 2018-06-12 DIAGNOSIS — Z87891 Personal history of nicotine dependence: Secondary | ICD-10-CM | POA: Diagnosis not present

## 2018-06-13 ENCOUNTER — Other Ambulatory Visit (HOSPITAL_COMMUNITY): Payer: Self-pay
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
## 2018-06-25 ENCOUNTER — Encounter: Payer: No Typology Code available for payment source | Attending: Registered Nurse | Admitting: Registered Nurse

## 2018-06-25 ENCOUNTER — Encounter: Payer: Self-pay | Admitting: Registered Nurse

## 2018-06-25 VITALS — BP 125/84 | HR 70 | Ht 61.0 in | Wt 190.0 lb

## 2018-06-25 DIAGNOSIS — M961 Postlaminectomy syndrome, not elsewhere classified: Secondary | ICD-10-CM | POA: Diagnosis not present

## 2018-06-25 DIAGNOSIS — G8929 Other chronic pain: Secondary | ICD-10-CM | POA: Diagnosis present

## 2018-06-25 DIAGNOSIS — Z5181 Encounter for therapeutic drug level monitoring: Secondary | ICD-10-CM

## 2018-06-25 DIAGNOSIS — F329 Major depressive disorder, single episode, unspecified: Secondary | ICD-10-CM | POA: Diagnosis not present

## 2018-06-25 DIAGNOSIS — Z8249 Family history of ischemic heart disease and other diseases of the circulatory system: Secondary | ICD-10-CM | POA: Insufficient documentation

## 2018-06-25 DIAGNOSIS — M7918 Myalgia, other site: Secondary | ICD-10-CM | POA: Diagnosis not present

## 2018-06-25 DIAGNOSIS — M5412 Radiculopathy, cervical region: Secondary | ICD-10-CM | POA: Diagnosis not present

## 2018-06-25 DIAGNOSIS — R202 Paresthesia of skin: Secondary | ICD-10-CM | POA: Insufficient documentation

## 2018-06-25 DIAGNOSIS — K5900 Constipation, unspecified: Secondary | ICD-10-CM | POA: Diagnosis not present

## 2018-06-25 DIAGNOSIS — G894 Chronic pain syndrome: Secondary | ICD-10-CM | POA: Diagnosis not present

## 2018-06-25 DIAGNOSIS — G8928 Other chronic postprocedural pain: Secondary | ICD-10-CM | POA: Diagnosis not present

## 2018-06-25 DIAGNOSIS — M4722 Other spondylosis with radiculopathy, cervical region: Secondary | ICD-10-CM

## 2018-06-25 DIAGNOSIS — Z981 Arthrodesis status: Secondary | ICD-10-CM | POA: Insufficient documentation

## 2018-06-25 DIAGNOSIS — M25512 Pain in left shoulder: Secondary | ICD-10-CM | POA: Insufficient documentation

## 2018-06-25 DIAGNOSIS — Z79891 Long term (current) use of opiate analgesic: Secondary | ICD-10-CM | POA: Diagnosis not present

## 2018-06-25 DIAGNOSIS — G243 Spasmodic torticollis: Secondary | ICD-10-CM | POA: Diagnosis not present

## 2018-06-25 DIAGNOSIS — F419 Anxiety disorder, unspecified: Secondary | ICD-10-CM | POA: Diagnosis not present

## 2018-06-25 DIAGNOSIS — G4700 Insomnia, unspecified: Secondary | ICD-10-CM | POA: Insufficient documentation

## 2018-06-25 DIAGNOSIS — G249 Dystonia, unspecified: Secondary | ICD-10-CM | POA: Insufficient documentation

## 2018-06-25 DIAGNOSIS — Z760 Encounter for issue of repeat prescription: Secondary | ICD-10-CM | POA: Diagnosis present

## 2018-06-25 MED ORDER — PREGABALIN 100 MG PO CAPS: 100.0000 mg | ORAL_CAPSULE | Freq: Three times a day (TID) | ORAL | 3 refills | Status: DC

## 2018-06-25 MED ORDER — HYDROCODONE-ACETAMINOPHEN 7.5-325 MG PO TABS: 1.0000 | ORAL_TABLET | Freq: Four times a day (QID) | ORAL | 0 refills | Status: DC | PRN
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
## 2018-06-26 ENCOUNTER — Inpatient Hospital Stay: Payer: Medicare HMO

## 2018-06-26 ENCOUNTER — Ambulatory Visit: Payer: Medicare HMO

## 2018-06-26 ENCOUNTER — Telehealth: Payer: Self-pay | Admitting: Hematology

## 2018-06-26 ENCOUNTER — Inpatient Hospital Stay: Payer: Medicare HMO | Attending: Hematology | Admitting: Hematology

## 2018-06-26 ENCOUNTER — Telehealth: Payer: Self-pay

## 2018-06-26 VITALS — BP 138/92 | HR 72 | Temp 98.1°F | Resp 18 | Ht 61.0 in | Wt 186.6 lb

## 2018-06-26 DIAGNOSIS — R7402 Elevation of levels of lactic acid dehydrogenase (LDH): Secondary | ICD-10-CM

## 2018-06-26 DIAGNOSIS — D709 Neutropenia, unspecified: Secondary | ICD-10-CM | POA: Diagnosis not present

## 2018-06-26 DIAGNOSIS — G4700 Insomnia, unspecified: Secondary | ICD-10-CM | POA: Diagnosis not present

## 2018-06-26 DIAGNOSIS — Z7982 Long term (current) use of aspirin: Secondary | ICD-10-CM | POA: Diagnosis not present

## 2018-06-26 DIAGNOSIS — I10 Essential (primary) hypertension: Secondary | ICD-10-CM | POA: Diagnosis not present

## 2018-06-26 DIAGNOSIS — R740 Nonspecific elevation of levels of transaminase and lactic acid dehydrogenase [LDH]: Secondary | ICD-10-CM

## 2018-06-26 DIAGNOSIS — Z79899 Other long term (current) drug therapy: Secondary | ICD-10-CM | POA: Insufficient documentation

## 2018-06-26 DIAGNOSIS — E876 Hypokalemia: Secondary | ICD-10-CM | POA: Diagnosis not present

## 2018-06-26 DIAGNOSIS — F419 Anxiety disorder, unspecified: Secondary | ICD-10-CM | POA: Insufficient documentation

## 2018-06-26 DIAGNOSIS — J45909 Unspecified asthma, uncomplicated: Secondary | ICD-10-CM | POA: Insufficient documentation

## 2018-06-26 DIAGNOSIS — F329 Major depressive disorder, single episode, unspecified: Secondary | ICD-10-CM | POA: Insufficient documentation

## 2018-06-26 LAB — CBC WITH DIFFERENTIAL/PLATELET
Basophils Absolute: 0 10*3/uL (ref 0.0–0.1)
Basophils Relative: 0 %
Eosinophils Absolute: 0.1 10*3/uL (ref 0.0–0.5)
Eosinophils Relative: 5 %
HEMATOCRIT: 36.7 % (ref 34.8–46.6)
Hemoglobin: 12.2 g/dL (ref 11.6–15.9)
LYMPHS ABS: 1.1 10*3/uL (ref 0.9–3.3)
LYMPHS PCT: 46 %
MCH: 28.9 pg (ref 25.1–34.0)
MCHC: 33.2 g/dL (ref 31.5–36.0)
MCV: 87 fL (ref 79.5–101.0)
MONO ABS: 0.2 10*3/uL (ref 0.1–0.9)
MONOS PCT: 8 %
NEUTROS ABS: 1 10*3/uL — AB (ref 1.5–6.5)
NEUTROS PCT: 41 %
Platelets: 236 10*3/uL (ref 145–400)
RBC: 4.22 MIL/uL (ref 3.70–5.45)
RDW: 13.5 % (ref 11.2–14.5)
WBC: 2.4 10*3/uL — ABNORMAL LOW (ref 3.9–10.3)

## 2018-06-26 LAB — VITAMIN B12: Vitamin B-12: 400 pg/mL (ref 180–914)

## 2018-06-26 LAB — CMP (CANCER CENTER ONLY)
ALT: 26 U/L (ref 0–44)
AST: 21 U/L (ref 15–41)
Albumin: 4.2 g/dL (ref 3.5–5.0)
Alkaline Phosphatase: 80 U/L (ref 38–126)
Anion gap: 7 (ref 5–15)
BILIRUBIN TOTAL: 0.3 mg/dL (ref 0.3–1.2)
BUN: 16 mg/dL (ref 6–20)
CO2: 28 mmol/L (ref 22–32)
CREATININE: 1.09 mg/dL — AB (ref 0.44–1.00)
Calcium: 9.5 mg/dL (ref 8.9–10.3)
Chloride: 104 mmol/L (ref 98–111)
GFR, EST NON AFRICAN AMERICAN: 56 mL/min — AB (ref >60)
GFR, Est AFR Am: >60 mL/min (ref >60)
Glucose, Bld: 95 mg/dL (ref 70–99)
POTASSIUM: 4 mmol/L (ref 3.5–5.1)
Sodium: 139 mmol/L (ref 135–145)
TOTAL PROTEIN: 7.5 g/dL (ref 6.5–8.1)

## 2018-06-26 LAB — SAVE SMEAR

## 2018-06-26 LAB — LACTATE DEHYDROGENASE: LDH: 344 U/L — ABNORMAL HIGH (ref 98–192)

## 2018-06-26 LAB — SEDIMENTATION RATE: SED RATE: 15 mm/h (ref 0–22)

## 2018-06-26 NOTE — Telephone Encounter (Signed)
Appointments scheduled AVS/Calendar printed per 7/10 los °

## 2018-06-26 NOTE — Telephone Encounter (Signed)
Mr. Maria Griffith called yesterday stating that there was some issue with the prescription in the way that it was written and needs a new script for [name] to be emailed to him at [email or web address]

## 2018-06-26 NOTE — Telephone Encounter (Signed)
Prescription was sent to Mr. [name] via scan.  Ms. Deschamps was notified regarding the above no answer

## 2018-06-27 LAB — FOLATE RBC
FOLATE, HEMOLYSATE: 453.4 ng/mL
Folate, RBC: 1219 ng/mL (ref >498)
Hematocrit: 37.2 % (ref 34.0–46.6)

## 2018-06-28 LAB — ANTINUCLEAR ANTIBODIES, IFA: ANA Ab, IFA: NEGATIVE

## 2018-06-28 LAB — COPPER, SERUM: Copper: 111 ug/dL (ref 72–166)

## 2018-07-02 ENCOUNTER — Ambulatory Visit: Payer: Medicare HMO

## 2018-07-30 ENCOUNTER — Encounter: Payer: No Typology Code available for payment source | Attending: Physical Medicine & Rehabilitation

## 2018-07-30 ENCOUNTER — Ambulatory Visit: Payer: No Typology Code available for payment source | Admitting: Physical Medicine & Rehabilitation

## 2018-07-30 ENCOUNTER — Other Ambulatory Visit: Payer: Self-pay | Admitting: Registered Nurse

## 2018-07-30 DIAGNOSIS — G243 Spasmodic torticollis: Secondary | ICD-10-CM

## 2018-07-30 MED ORDER — TRAZODONE HCL 150 MG PO TABS: 150.0000 mg | ORAL_TABLET | Freq: Every day | ORAL | 2 refills | Status: DC

## 2018-07-30 MED ORDER — TIZANIDINE HCL 4 MG PO TABS: ORAL_TABLET | ORAL | 2 refills | Status: DC

## 2018-07-30 MED ORDER — SENNOSIDES-DOCUSATE SODIUM 8.6-50 MG PO TABS: 1.0000 | ORAL_TABLET | Freq: Every day | ORAL | 5 refills | Status: DC

## 2018-07-30 MED ORDER — HYDROCODONE-ACETAMINOPHEN 7.5-325 MG PO TABS: 1.0000 | ORAL_TABLET | Freq: Four times a day (QID) | ORAL | 0 refills | Status: DC | PRN

## 2018-07-30 NOTE — Patient Instructions (Signed)

## 2018-07-30 NOTE — Progress Notes (Signed)
   Procedure: Dysport injection Diagnosis: Cervical dystonia G 24.3 Dilution: 300 units in .6 mL sterile preservative-free normal saline  Informed consent was obtained after describing risks and benefits of the procedure with patient including bleeding bruising infection as well as the potential side effects of the medication itself. Patient elects to proceed and has given written consent.  Patient placed in a seated position Areas were marked and prepped with Betadine  Needle: 27-gauge 1 inch needle electrode connected to EMG amplifier   Left trapezius: 50 units Left longissimus: 100 units Left splenius capitis: 100 units Lef Levator scapula: 50  All injections done after negative drawback for blood. Appropriate EMG activity.  Next visit 3-4 mo for repeat with 300 U dysport Wont inject Left trap may put additional 50 L Splenius  Nurse practitioner visit in 1 month to refill narcotic analgesic medication and check for compliance.

## 2018-08-06 ENCOUNTER — Ambulatory Visit: Payer: Self-pay | Admitting: Registered Nurse

## 2018-08-27 ENCOUNTER — Encounter: Payer: Self-pay | Admitting: Registered Nurse

## 2018-08-27 ENCOUNTER — Encounter
Payer: No Typology Code available for payment source | Attending: Physical Medicine & Rehabilitation | Admitting: Registered Nurse

## 2018-08-27 VITALS — BP 154/100 | HR 65 | Ht 61.0 in | Wt 189.0 lb

## 2018-08-27 DIAGNOSIS — G4709 Other insomnia: Secondary | ICD-10-CM

## 2018-08-27 DIAGNOSIS — I10 Essential (primary) hypertension: Secondary | ICD-10-CM

## 2018-08-27 DIAGNOSIS — G894 Chronic pain syndrome: Secondary | ICD-10-CM

## 2018-08-27 DIAGNOSIS — M4722 Other spondylosis with radiculopathy, cervical region: Secondary | ICD-10-CM | POA: Diagnosis not present

## 2018-08-27 DIAGNOSIS — M961 Postlaminectomy syndrome, not elsewhere classified: Secondary | ICD-10-CM

## 2018-08-27 DIAGNOSIS — M7918 Myalgia, other site: Secondary | ICD-10-CM | POA: Diagnosis not present

## 2018-08-27 DIAGNOSIS — G5602 Carpal tunnel syndrome, left upper limb: Secondary | ICD-10-CM

## 2018-08-27 DIAGNOSIS — M5412 Radiculopathy, cervical region: Secondary | ICD-10-CM

## 2018-08-27 DIAGNOSIS — G243 Spasmodic torticollis: Secondary | ICD-10-CM | POA: Diagnosis present

## 2018-08-27 DIAGNOSIS — Z79891 Long term (current) use of opiate analgesic: Secondary | ICD-10-CM

## 2018-08-27 DIAGNOSIS — Z5181 Encounter for therapeutic drug level monitoring: Secondary | ICD-10-CM

## 2018-08-27 MED ORDER — HYDROCODONE-ACETAMINOPHEN 7.5-325 MG PO TABS: 1.0000 | ORAL_TABLET | Freq: Four times a day (QID) | ORAL | 0 refills | Status: DC | PRN
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
## 2018-08-29 ENCOUNTER — Telehealth: Payer: Self-pay | Admitting: Registered Nurse

## 2018-08-29 NOTE — Telephone Encounter (Signed)
Placed a call to [name] regarding heat wraps prescriptions were faxed to Mr. Maria Griffith, no answer. Left message regarding the above.

## 2018-09-02 ENCOUNTER — Telehealth: Payer: Self-pay | Admitting: Registered Nurse

## 2018-09-02 NOTE — Telephone Encounter (Signed)
Placed a call to Ms. Renato Battles, regarding the approval of heat wraps via workman's compensation. According to email from Mr. [name]. No answer, left message on her voice mail.

## 2018-09-06 ENCOUNTER — Ambulatory Visit: Payer: Self-pay | Admitting: Family Medicine

## 2018-09-06 ENCOUNTER — Ambulatory Visit: Payer: Medicare HMO | Admitting: Family Medicine

## 2018-09-06 ENCOUNTER — Ambulatory Visit: Payer: Medicare HMO

## 2018-09-06 ENCOUNTER — Telehealth: Payer: Self-pay | Admitting: Family Medicine

## 2018-09-06 DIAGNOSIS — I10 Essential (primary) hypertension: Secondary | ICD-10-CM

## 2018-09-06 NOTE — Telephone Encounter (Signed)
Will forward to MD.  Patient has an appointment on 09/27/18.  She missed her appointment this morning. Wenona [place],CMA

## 2018-09-06 NOTE — Progress Notes (Signed)
Pt was late for her apt with PCP this morning and was not able to be seen. Pt stated to the front staff she would like her BP checked since she has been out of her meds for 2 days, I agreed.   Pts BP today in nurse clinic: 152/88. Pt last took her BP meds 2 days ago. Pt stated they have been called in, just waiting for the pharmacy to deliver them. Pt denies any current symptoms, "states I feel alright, just tired."   Pt has been rescheduled with pcp, 10/11.

## 2018-09-06 NOTE — Telephone Encounter (Signed)
I am not sure how quickly I can get to her since I am still working on AM clinic. Please call and advise Urgent care evaluation for pain and irritation since I am unable to treat and evaluate over the telephone.

## 2018-09-06 NOTE — Telephone Encounter (Signed)
Patient request pcp give her a call @ [phone]. Patient has pain and irritation.

## 2018-09-06 NOTE — Telephone Encounter (Signed)
Patient informed and will go to urgent care.  Reminded of her appt with Dr. Gwendlyn Deutscher on 09-27-18.  Kaleisha Bhargava,CMA

## 2018-09-09 ENCOUNTER — Other Ambulatory Visit: Payer: Self-pay | Admitting: Registered Nurse

## 2018-09-17 ENCOUNTER — Encounter: Payer: No Typology Code available for payment source | Attending: Registered Nurse | Admitting: Registered Nurse

## 2018-09-17 ENCOUNTER — Encounter: Payer: Self-pay | Admitting: Registered Nurse

## 2018-09-17 VITALS — BP 143/92 | HR 65 | Ht 61.0 in | Wt 188.2 lb

## 2018-09-17 DIAGNOSIS — M5412 Radiculopathy, cervical region: Secondary | ICD-10-CM

## 2018-09-17 DIAGNOSIS — K5900 Constipation, unspecified: Secondary | ICD-10-CM | POA: Diagnosis not present

## 2018-09-17 DIAGNOSIS — Z79891 Long term (current) use of opiate analgesic: Secondary | ICD-10-CM

## 2018-09-17 DIAGNOSIS — R202 Paresthesia of skin: Secondary | ICD-10-CM | POA: Insufficient documentation

## 2018-09-17 DIAGNOSIS — G4700 Insomnia, unspecified: Secondary | ICD-10-CM | POA: Insufficient documentation

## 2018-09-17 DIAGNOSIS — M7918 Myalgia, other site: Secondary | ICD-10-CM | POA: Diagnosis not present

## 2018-09-17 DIAGNOSIS — G8929 Other chronic pain: Secondary | ICD-10-CM | POA: Insufficient documentation

## 2018-09-17 DIAGNOSIS — F329 Major depressive disorder, single episode, unspecified: Secondary | ICD-10-CM | POA: Diagnosis not present

## 2018-09-17 DIAGNOSIS — Z8249 Family history of ischemic heart disease and other diseases of the circulatory system: Secondary | ICD-10-CM | POA: Insufficient documentation

## 2018-09-17 DIAGNOSIS — Z981 Arthrodesis status: Secondary | ICD-10-CM | POA: Insufficient documentation

## 2018-09-17 DIAGNOSIS — M961 Postlaminectomy syndrome, not elsewhere classified: Secondary | ICD-10-CM

## 2018-09-17 DIAGNOSIS — Z760 Encounter for issue of repeat prescription: Secondary | ICD-10-CM | POA: Diagnosis present

## 2018-09-17 DIAGNOSIS — G5602 Carpal tunnel syndrome, left upper limb: Secondary | ICD-10-CM

## 2018-09-17 DIAGNOSIS — G249 Dystonia, unspecified: Secondary | ICD-10-CM | POA: Insufficient documentation

## 2018-09-17 DIAGNOSIS — M4722 Other spondylosis with radiculopathy, cervical region: Secondary | ICD-10-CM

## 2018-09-17 DIAGNOSIS — G8928 Other chronic postprocedural pain: Secondary | ICD-10-CM | POA: Insufficient documentation

## 2018-09-17 DIAGNOSIS — G894 Chronic pain syndrome: Secondary | ICD-10-CM

## 2018-09-17 DIAGNOSIS — G4709 Other insomnia: Secondary | ICD-10-CM

## 2018-09-17 DIAGNOSIS — Z5181 Encounter for therapeutic drug level monitoring: Secondary | ICD-10-CM

## 2018-09-17 DIAGNOSIS — F419 Anxiety disorder, unspecified: Secondary | ICD-10-CM | POA: Diagnosis not present

## 2018-09-17 DIAGNOSIS — M25512 Pain in left shoulder: Secondary | ICD-10-CM | POA: Diagnosis not present

## 2018-09-17 MED ORDER — PREGABALIN 100 MG PO CAPS: 100.0000 mg | ORAL_CAPSULE | Freq: Three times a day (TID) | ORAL | 3 refills | Status: DC

## 2018-09-17 MED ORDER — HYDROCODONE-ACETAMINOPHEN 7.5-325 MG PO TABS: 1.0000 | ORAL_TABLET | Freq: Four times a day (QID) | ORAL | 0 refills | Status: DC | PRN

## 2018-09-17 MED ORDER — TRAZODONE HCL 150 MG PO TABS: 150.0000 mg | ORAL_TABLET | Freq: Every day | ORAL | 2 refills | Status: DC
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
## 2018-09-27 ENCOUNTER — Encounter: Payer: Self-pay | Admitting: Family Medicine

## 2018-09-27 ENCOUNTER — Ambulatory Visit (INDEPENDENT_AMBULATORY_CARE_PROVIDER_SITE_OTHER): Payer: Medicare HMO | Admitting: Family Medicine

## 2018-09-27 ENCOUNTER — Other Ambulatory Visit: Payer: Self-pay

## 2018-09-27 VITALS — BP 138/90 | HR 70 | Temp 98.3°F | Ht 61.0 in | Wt 189.0 lb

## 2018-09-27 DIAGNOSIS — R300 Dysuria: Secondary | ICD-10-CM

## 2018-09-27 DIAGNOSIS — Z23 Encounter for immunization: Secondary | ICD-10-CM

## 2018-09-27 DIAGNOSIS — I10 Essential (primary) hypertension: Secondary | ICD-10-CM

## 2018-09-27 DIAGNOSIS — N179 Acute kidney failure, unspecified: Secondary | ICD-10-CM

## 2018-09-27 DIAGNOSIS — R399 Unspecified symptoms and signs involving the genitourinary system: Secondary | ICD-10-CM | POA: Diagnosis not present

## 2018-09-27 LAB — POCT URINALYSIS DIP (MANUAL ENTRY)
Bilirubin, UA: NEGATIVE
GLUCOSE UA: NEGATIVE mg/dL
Ketones, POC UA: NEGATIVE mg/dL
Leukocytes, UA: NEGATIVE
Nitrite, UA: NEGATIVE
PROTEIN UA: NEGATIVE mg/dL
RBC UA: NEGATIVE
SPEC GRAV UA: 1.025 (ref 1.010–1.025)
UROBILINOGEN UA: 0.2 U/dL
pH, UA: 5.5 (ref 5.0–8.0)

## 2018-09-27 MED ORDER — LORATADINE 10 MG PO TABS: 10.0000 mg | ORAL_TABLET | Freq: Every day | ORAL | 1 refills | Status: DC | PRN

## 2018-09-27 MED ORDER — ZOSTER VAC RECOMB ADJUVANTED 50 MCG/0.5ML IM SUSR: 0.5000 mL | Freq: Once | INTRAMUSCULAR | 1 refills | Status: AC

## 2018-09-27 NOTE — Addendum Note (Signed)
Addended by: Andrena Mews T on: 09/27/2018 01:49 PM   Modules accepted: Orders

## 2018-09-27 NOTE — Patient Instructions (Signed)

## 2018-09-27 NOTE — Assessment & Plan Note (Signed)
Recheck Bmet.
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
## 2018-09-27 NOTE — Assessment & Plan Note (Signed)
BP elevated. Repeat BP improved. Continue current regimen. F?U in 4 weeks for reassessment.

## 2018-09-28 LAB — BASIC METABOLIC PANEL WITH GFR
BUN/Creatinine Ratio: 19 (ref 9–23)
BUN: 19 mg/dL (ref 6–24)
CO2: 28 mmol/L (ref 20–29)
Calcium: 9.2 mg/dL (ref 8.7–10.2)
Chloride: 102 mmol/L (ref 96–106)
Creatinine, Ser: 1 mg/dL (ref 0.57–1.00)
GFR calc Af Amer: 73 mL/min/1.73 (ref >59)
GFR calc non Af Amer: 63 mL/min/1.73 (ref >59)
Glucose: 88 mg/dL (ref 65–99)
Potassium: 4.1 mmol/L (ref 3.5–5.2)
Sodium: 141 mmol/L (ref 134–144)

## 2018-09-29 LAB — URINE CULTURE

## 2018-09-30 ENCOUNTER — Telehealth: Payer: Self-pay | Admitting: Family Medicine

## 2018-09-30 MED ORDER — FLUCONAZOLE 150 MG PO TABS: 150.0000 mg | ORAL_TABLET | Freq: Once | ORAL | 0 refills | Status: AC

## 2018-09-30 NOTE — Telephone Encounter (Signed)
LMOVM for pt to return call. Fleeger, Jessica Dawn, CMA  

## 2018-09-30 NOTE — Addendum Note (Signed)
Addended by: Andrena Mews T on: 09/30/2018 10:34 AM   Modules accepted: Orders

## 2018-09-30 NOTE — Telephone Encounter (Signed)
I escribed the Shingrix last Friday to [facility] pharmacy. I called and the pharmacist confirmed that they have Shingrix on file for her. Please ensure this is the right pharmacy.  Also, I just escribed her Diflucan. Thanks.

## 2018-09-30 NOTE — Telephone Encounter (Signed)
HIPAA compliant call back message left.   Let her that her result is okay when she calls back.

## 2018-09-30 NOTE — Telephone Encounter (Signed)
Pt calls back She was informed of the message from MD.  She has other questions:  1. Pharmacy never received the shingrix.  Upon review it looks as if the Rx was dc'd.  Will forward to MD for clarification  2. Pt said she spoke with MD about calling in a medication for vaginal itching.  I see nothing in the chart.  Fleeger, Salome Spotted, CMA

## 2018-09-30 NOTE — Telephone Encounter (Signed)
Patient aware. Nasier Thumm, RN ([facility] RN)  

## 2018-10-10 ENCOUNTER — Other Ambulatory Visit: Payer: Self-pay | Admitting: Registered Nurse

## 2018-10-22 ENCOUNTER — Encounter: Payer: No Typology Code available for payment source | Attending: Physical Medicine & Rehabilitation

## 2018-10-22 ENCOUNTER — Ambulatory Visit (HOSPITAL_BASED_OUTPATIENT_CLINIC_OR_DEPARTMENT_OTHER): Payer: No Typology Code available for payment source | Admitting: Physical Medicine & Rehabilitation

## 2018-10-22 ENCOUNTER — Encounter: Payer: Self-pay | Admitting: Physical Medicine & Rehabilitation

## 2018-10-22 VITALS — BP 185/111 | HR 77 | Ht 61.0 in | Wt 186.0 lb

## 2018-10-22 DIAGNOSIS — G243 Spasmodic torticollis: Secondary | ICD-10-CM | POA: Insufficient documentation

## 2018-10-22 MED ORDER — HYDROCODONE-ACETAMINOPHEN 7.5-325 MG PO TABS: 1.0000 | ORAL_TABLET | Freq: Four times a day (QID) | ORAL | 0 refills | Status: DC | PRN

## 2018-10-22 NOTE — Progress Notes (Signed)
   Procedure: Dysport injection Diagnosis: Cervical dystonia G 24.3 Dilution: 300 units in .6 mL sterile preservative-free normal saline  Informed consent was obtained after describing risks and benefits of the procedure with patient including bleeding bruising infection as well as the potential side effects of the medication itself. Patient elects to proceed and has given written consent.  Patient placed in a seated position Areas were marked and prepped with Betadine  Needle: 27-gauge 1 inch needle electrode connected to EMG amplifier    Left longissimus: 100 units Left splenius capitis: 150 units Lef Levator scapula: 50  All injections done after negative drawback for blood. Appropriate EMG activity.    Nurse practitioner visit in 1 month to refill narcotic analgesic medication and check for compliance. PMP aware reviewed today no red flags

## 2018-10-22 NOTE — Patient Instructions (Signed)

## 2018-10-24 ENCOUNTER — Telehealth: Payer: Self-pay | Admitting: Hematology

## 2018-10-24 NOTE — Telephone Encounter (Signed)
GK out 11/12 - per [facility] move lab/fu four weeks out. Spoke with patient re lab/fu 12/11. Per patients appointments on Tuesday in the morning. Gave patient lab/fu for 12/10.

## 2018-10-29 ENCOUNTER — Ambulatory Visit: Payer: Self-pay | Admitting: Hematology

## 2018-10-29 ENCOUNTER — Other Ambulatory Visit: Payer: Self-pay

## 2018-11-11 ENCOUNTER — Other Ambulatory Visit: Payer: Self-pay | Admitting: Physical Medicine & Rehabilitation

## 2018-11-19 ENCOUNTER — Encounter: Payer: Self-pay | Admitting: Registered Nurse

## 2018-11-19 ENCOUNTER — Encounter: Payer: No Typology Code available for payment source | Attending: Registered Nurse | Admitting: Registered Nurse

## 2018-11-19 VITALS — BP 125/83 | HR 67 | Ht 61.0 in | Wt 187.0 lb

## 2018-11-19 DIAGNOSIS — Z5181 Encounter for therapeutic drug level monitoring: Secondary | ICD-10-CM

## 2018-11-19 DIAGNOSIS — M7918 Myalgia, other site: Secondary | ICD-10-CM | POA: Insufficient documentation

## 2018-11-19 DIAGNOSIS — G8929 Other chronic pain: Secondary | ICD-10-CM | POA: Diagnosis present

## 2018-11-19 DIAGNOSIS — M961 Postlaminectomy syndrome, not elsewhere classified: Secondary | ICD-10-CM | POA: Diagnosis not present

## 2018-11-19 DIAGNOSIS — M5412 Radiculopathy, cervical region: Secondary | ICD-10-CM | POA: Diagnosis not present

## 2018-11-19 DIAGNOSIS — G4709 Other insomnia: Secondary | ICD-10-CM

## 2018-11-19 DIAGNOSIS — G8928 Other chronic postprocedural pain: Secondary | ICD-10-CM | POA: Diagnosis not present

## 2018-11-19 DIAGNOSIS — G4700 Insomnia, unspecified: Secondary | ICD-10-CM | POA: Diagnosis not present

## 2018-11-19 DIAGNOSIS — G249 Dystonia, unspecified: Secondary | ICD-10-CM | POA: Insufficient documentation

## 2018-11-19 DIAGNOSIS — Z981 Arthrodesis status: Secondary | ICD-10-CM | POA: Insufficient documentation

## 2018-11-19 DIAGNOSIS — F419 Anxiety disorder, unspecified: Secondary | ICD-10-CM | POA: Insufficient documentation

## 2018-11-19 DIAGNOSIS — Z8249 Family history of ischemic heart disease and other diseases of the circulatory system: Secondary | ICD-10-CM | POA: Insufficient documentation

## 2018-11-19 DIAGNOSIS — F329 Major depressive disorder, single episode, unspecified: Secondary | ICD-10-CM | POA: Diagnosis not present

## 2018-11-19 DIAGNOSIS — G5602 Carpal tunnel syndrome, left upper limb: Secondary | ICD-10-CM

## 2018-11-19 DIAGNOSIS — R202 Paresthesia of skin: Secondary | ICD-10-CM | POA: Diagnosis not present

## 2018-11-19 DIAGNOSIS — M4722 Other spondylosis with radiculopathy, cervical region: Secondary | ICD-10-CM | POA: Diagnosis not present

## 2018-11-19 DIAGNOSIS — Z79891 Long term (current) use of opiate analgesic: Secondary | ICD-10-CM

## 2018-11-19 DIAGNOSIS — Z760 Encounter for issue of repeat prescription: Secondary | ICD-10-CM | POA: Insufficient documentation

## 2018-11-19 DIAGNOSIS — G894 Chronic pain syndrome: Secondary | ICD-10-CM

## 2018-11-19 DIAGNOSIS — K5900 Constipation, unspecified: Secondary | ICD-10-CM | POA: Diagnosis not present

## 2018-11-19 DIAGNOSIS — M25512 Pain in left shoulder: Secondary | ICD-10-CM | POA: Diagnosis not present

## 2018-11-19 MED ORDER — HYDROCODONE-ACETAMINOPHEN 7.5-325 MG PO TABS: 1.0000 | ORAL_TABLET | Freq: Four times a day (QID) | ORAL | 0 refills | Status: DC | PRN

## 2018-11-19 MED ORDER — TIZANIDINE HCL 4 MG PO TABS: ORAL_TABLET | ORAL | 4 refills | Status: DC
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
## 2018-11-26 ENCOUNTER — Inpatient Hospital Stay: Payer: Medicare HMO | Attending: Hematology

## 2018-11-26 ENCOUNTER — Inpatient Hospital Stay (HOSPITAL_BASED_OUTPATIENT_CLINIC_OR_DEPARTMENT_OTHER): Payer: Medicare HMO | Admitting: Hematology

## 2018-11-26 VITALS — BP 118/72 | HR 71 | Temp 97.7°F | Resp 18 | Ht 61.0 in | Wt 188.4 lb

## 2018-11-26 DIAGNOSIS — Z7982 Long term (current) use of aspirin: Secondary | ICD-10-CM | POA: Diagnosis not present

## 2018-11-26 DIAGNOSIS — Z79899 Other long term (current) drug therapy: Secondary | ICD-10-CM | POA: Insufficient documentation

## 2018-11-26 DIAGNOSIS — D709 Neutropenia, unspecified: Secondary | ICD-10-CM

## 2018-11-26 DIAGNOSIS — I10 Essential (primary) hypertension: Secondary | ICD-10-CM | POA: Insufficient documentation

## 2018-11-26 DIAGNOSIS — D72819 Decreased white blood cell count, unspecified: Secondary | ICD-10-CM | POA: Insufficient documentation

## 2018-11-26 DIAGNOSIS — R7402 Elevation of levels of lactic acid dehydrogenase (LDH): Secondary | ICD-10-CM

## 2018-11-26 DIAGNOSIS — R740 Nonspecific elevation of levels of transaminase and lactic acid dehydrogenase [LDH]: Secondary | ICD-10-CM

## 2018-11-26 LAB — CBC WITH DIFFERENTIAL/PLATELET
ABS IMMATURE GRANULOCYTES: 0.01 10*3/uL (ref 0.00–0.07)
BASOS PCT: 0 %
Basophils Absolute: 0 10*3/uL (ref 0.0–0.1)
Eosinophils Absolute: 0.2 10*3/uL (ref 0.0–0.5)
Eosinophils Relative: 6 %
HCT: 36.3 % (ref 36.0–46.0)
HEMOGLOBIN: 11.6 g/dL — AB (ref 12.0–15.0)
IMMATURE GRANULOCYTES: 0 %
LYMPHS ABS: 1.2 10*3/uL (ref 0.7–4.0)
LYMPHS PCT: 46 %
MCH: 27.7 pg (ref 26.0–34.0)
MCHC: 32 g/dL (ref 30.0–36.0)
MCV: 86.6 fL (ref 80.0–100.0)
MONO ABS: 0.2 10*3/uL (ref 0.1–1.0)
MONOS PCT: 8 %
NEUTROS ABS: 1 10*3/uL — AB (ref 1.7–7.7)
NEUTROS PCT: 40 %
PLATELETS: 199 10*3/uL (ref 150–400)
RBC: 4.19 MIL/uL (ref 3.87–5.11)
RDW: 13.6 % (ref 11.5–15.5)
WBC: 2.6 10*3/uL — ABNORMAL LOW (ref 4.0–10.5)
nRBC: 0 % (ref 0.0–0.2)

## 2018-11-26 LAB — LACTATE DEHYDROGENASE: LDH: 320 U/L — AB (ref 98–192)

## 2018-11-27 ENCOUNTER — Ambulatory Visit: Payer: Self-pay | Admitting: Hematology

## 2018-11-27 ENCOUNTER — Other Ambulatory Visit: Payer: Self-pay

## 2018-12-02 ENCOUNTER — Other Ambulatory Visit: Payer: Self-pay | Admitting: Family Medicine

## 2018-12-12 ENCOUNTER — Other Ambulatory Visit: Payer: Self-pay | Admitting: Family Medicine

## 2018-12-14 ENCOUNTER — Other Ambulatory Visit: Payer: Self-pay | Admitting: Family Medicine

## 2018-12-16 NOTE — Telephone Encounter (Signed)
To blue team. 

## 2018-12-17 ENCOUNTER — Encounter (HOSPITAL_BASED_OUTPATIENT_CLINIC_OR_DEPARTMENT_OTHER): Payer: No Typology Code available for payment source | Admitting: Registered Nurse

## 2018-12-17 ENCOUNTER — Encounter: Payer: Self-pay | Admitting: Registered Nurse

## 2018-12-17 VITALS — BP 156/99 | HR 84 | Ht 61.0 in | Wt 183.0 lb

## 2018-12-17 DIAGNOSIS — Z5181 Encounter for therapeutic drug level monitoring: Secondary | ICD-10-CM

## 2018-12-17 DIAGNOSIS — M4722 Other spondylosis with radiculopathy, cervical region: Secondary | ICD-10-CM | POA: Diagnosis not present

## 2018-12-17 DIAGNOSIS — M961 Postlaminectomy syndrome, not elsewhere classified: Secondary | ICD-10-CM | POA: Diagnosis not present

## 2018-12-17 DIAGNOSIS — G5602 Carpal tunnel syndrome, left upper limb: Secondary | ICD-10-CM

## 2018-12-17 DIAGNOSIS — M5416 Radiculopathy, lumbar region: Secondary | ICD-10-CM

## 2018-12-17 DIAGNOSIS — M5412 Radiculopathy, cervical region: Secondary | ICD-10-CM

## 2018-12-17 DIAGNOSIS — Z79891 Long term (current) use of opiate analgesic: Secondary | ICD-10-CM

## 2018-12-17 DIAGNOSIS — F5101 Primary insomnia: Secondary | ICD-10-CM

## 2018-12-17 DIAGNOSIS — M7918 Myalgia, other site: Secondary | ICD-10-CM

## 2018-12-17 DIAGNOSIS — G894 Chronic pain syndrome: Secondary | ICD-10-CM

## 2018-12-17 DIAGNOSIS — G8929 Other chronic pain: Secondary | ICD-10-CM | POA: Diagnosis not present

## 2018-12-17 MED ORDER — HYDROCODONE-ACETAMINOPHEN 7.5-325 MG PO TABS: 1.0000 | ORAL_TABLET | Freq: Four times a day (QID) | ORAL | 0 refills | Status: DC | PRN
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
## 2018-12-19 ENCOUNTER — Other Ambulatory Visit: Payer: Self-pay | Admitting: Family Medicine

## 2018-12-19 ENCOUNTER — Telehealth: Payer: Self-pay | Admitting: Registered Nurse

## 2018-12-19 NOTE — Telephone Encounter (Signed)
Letter written to Mr. Maria Griffith regarding [name] Heat Wraps:  Cervical and Lumbar wraps and Tens Unit Electrodes and wire. She has a Water engineer: Pro-M-[id number] Awaiting on response from Mr, Maria Griffith.  Maria Griffith is aware of the above.

## 2019-01-08 ENCOUNTER — Ambulatory Visit: Payer: Self-pay | Admitting: Physical Medicine & Rehabilitation

## 2019-01-08 ENCOUNTER — Other Ambulatory Visit: Payer: Self-pay | Admitting: Family Medicine

## 2019-01-08 ENCOUNTER — Telehealth: Payer: Self-pay | Admitting: Physical Medicine & Rehabilitation

## 2019-01-08 ENCOUNTER — Ambulatory Visit: Payer: Self-pay | Admitting: Registered Nurse

## 2019-01-08 MED ORDER — HYDROCODONE-ACETAMINOPHEN 7.5-325 MG PO TABS: 1.0000 | ORAL_TABLET | Freq: Four times a day (QID) | ORAL | 0 refills | Status: DC | PRN

## 2019-01-08 NOTE — Telephone Encounter (Signed)
Patient was told to call in about her Hydrocodone.  Her appointment with Dr. Letta Pate is on 01/20/19 for Botox.  Zella Ball told her to call us to get a refill because of her appointment with Dr. Letta Pate isn't until 01/20/19.

## 2019-01-08 NOTE — Telephone Encounter (Signed)
Returned Maria Griffith call, she picked up her Hydrocodone on 12/19/2018, her next fill date will be due on 01/17/2019. PMP was reviewed, Hydrocodone prescription was sent. Maria Griffith is aware of the above.

## 2019-01-20 ENCOUNTER — Ambulatory Visit (HOSPITAL_BASED_OUTPATIENT_CLINIC_OR_DEPARTMENT_OTHER): Payer: No Typology Code available for payment source | Admitting: Physical Medicine & Rehabilitation

## 2019-01-20 ENCOUNTER — Encounter: Payer: Self-pay | Admitting: Physical Medicine & Rehabilitation

## 2019-01-20 ENCOUNTER — Encounter: Payer: No Typology Code available for payment source | Attending: Physical Medicine & Rehabilitation

## 2019-01-20 VITALS — BP 136/91 | HR 75 | Resp 14 | Ht 61.0 in | Wt 184.0 lb

## 2019-01-20 DIAGNOSIS — G243 Spasmodic torticollis: Secondary | ICD-10-CM | POA: Insufficient documentation

## 2019-01-20 DIAGNOSIS — M436 Torticollis: Secondary | ICD-10-CM

## 2019-01-20 NOTE — Progress Notes (Signed)
   Procedure: Dysport injection Diagnosis: Cervical dystonia G 24.3 Dilution: 300 units in .6 mL sterile preservative-free normal saline  Informed consent was obtained after describing risks and benefits of the procedure with patient including bleeding bruising infection as well as the potential side effects of the medication itself. Patient elects to proceed and has given written consent.  Patient placed in a seated position Areas were marked and prepped with Betadine  Needle: 27-gauge 1 inch needle electrode connected to EMG amplifier    Left longissimus: 100 units Left splenius capitis: 150 units Lef Levator scapula: 50  All injections done after negative drawback for blood. Appropriate EMG activity.   Nurse practitioner visit in 1 month to refill narcotic analgesic medication and check for compliance. PDMP reviewed appropriate

## 2019-01-29 ENCOUNTER — Other Ambulatory Visit: Payer: Self-pay | Admitting: Family Medicine

## 2019-02-10 ENCOUNTER — Encounter: Payer: No Typology Code available for payment source | Attending: Registered Nurse | Admitting: Registered Nurse

## 2019-02-10 ENCOUNTER — Telehealth: Payer: Self-pay | Admitting: Cardiovascular Disease

## 2019-02-10 ENCOUNTER — Encounter: Payer: Self-pay | Admitting: Registered Nurse

## 2019-02-10 ENCOUNTER — Other Ambulatory Visit: Payer: Self-pay

## 2019-02-10 VITALS — BP 192/100 | HR 69 | Ht 61.0 in | Wt 185.0 lb

## 2019-02-10 DIAGNOSIS — Z79891 Long term (current) use of opiate analgesic: Secondary | ICD-10-CM | POA: Diagnosis not present

## 2019-02-10 DIAGNOSIS — K5900 Constipation, unspecified: Secondary | ICD-10-CM | POA: Diagnosis not present

## 2019-02-10 DIAGNOSIS — Z760 Encounter for issue of repeat prescription: Secondary | ICD-10-CM | POA: Insufficient documentation

## 2019-02-10 DIAGNOSIS — M961 Postlaminectomy syndrome, not elsewhere classified: Secondary | ICD-10-CM | POA: Diagnosis not present

## 2019-02-10 DIAGNOSIS — J45909 Unspecified asthma, uncomplicated: Secondary | ICD-10-CM | POA: Insufficient documentation

## 2019-02-10 DIAGNOSIS — M25512 Pain in left shoulder: Secondary | ICD-10-CM | POA: Insufficient documentation

## 2019-02-10 DIAGNOSIS — G894 Chronic pain syndrome: Secondary | ICD-10-CM

## 2019-02-10 DIAGNOSIS — M5412 Radiculopathy, cervical region: Secondary | ICD-10-CM | POA: Diagnosis not present

## 2019-02-10 DIAGNOSIS — G4700 Insomnia, unspecified: Secondary | ICD-10-CM | POA: Diagnosis not present

## 2019-02-10 DIAGNOSIS — G5602 Carpal tunnel syndrome, left upper limb: Secondary | ICD-10-CM

## 2019-02-10 DIAGNOSIS — I10 Essential (primary) hypertension: Secondary | ICD-10-CM

## 2019-02-10 DIAGNOSIS — M4722 Other spondylosis with radiculopathy, cervical region: Secondary | ICD-10-CM | POA: Diagnosis not present

## 2019-02-10 DIAGNOSIS — G8928 Other chronic postprocedural pain: Secondary | ICD-10-CM | POA: Diagnosis not present

## 2019-02-10 DIAGNOSIS — Z5181 Encounter for therapeutic drug level monitoring: Secondary | ICD-10-CM | POA: Diagnosis not present

## 2019-02-10 DIAGNOSIS — Z8249 Family history of ischemic heart disease and other diseases of the circulatory system: Secondary | ICD-10-CM | POA: Insufficient documentation

## 2019-02-10 DIAGNOSIS — F5101 Primary insomnia: Secondary | ICD-10-CM

## 2019-02-10 DIAGNOSIS — E669 Obesity, unspecified: Secondary | ICD-10-CM | POA: Insufficient documentation

## 2019-02-10 DIAGNOSIS — R262 Difficulty in walking, not elsewhere classified: Secondary | ICD-10-CM | POA: Diagnosis not present

## 2019-02-10 DIAGNOSIS — M7918 Myalgia, other site: Secondary | ICD-10-CM | POA: Insufficient documentation

## 2019-02-10 DIAGNOSIS — Z6834 Body mass index (BMI) 34.0-34.9, adult: Secondary | ICD-10-CM | POA: Diagnosis not present

## 2019-02-10 DIAGNOSIS — G249 Dystonia, unspecified: Secondary | ICD-10-CM | POA: Diagnosis not present

## 2019-02-10 MED ORDER — HYDROCODONE-ACETAMINOPHEN 7.5-325 MG PO TABS: 1.0000 | ORAL_TABLET | Freq: Four times a day (QID) | ORAL | 0 refills | Status: DC | PRN

## 2019-02-10 MED ORDER — SPIRONOLACTONE 25 MG PO TABS: 25.0000 mg | ORAL_TABLET | Freq: Every day | ORAL | 3 refills | Status: DC
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
## 2019-02-10 NOTE — Telephone Encounter (Signed)
Pt c/o BP issue: STAT if pt c/o blurred vision, one-sided weakness or slurred speech  1. What are your last 5 BP readings? 200/108      205/117      206/118        2. Are you having any other symptoms (ex. Dizziness, headache, blurred vision, passed out)? No   3. What is your BP issue? Patient is a pain at her pain dr., they check her BP and it was elevated they advised her to call her cardiologist.

## 2019-02-10 NOTE — Telephone Encounter (Signed)
Spoke with the patient who states that she is currently at her pain MD, Dr. Danella Sensing and her BP is elevated. She states that it is 206/118 and 200/108. HR 69. She states that she was instructed to be evaluated in the ER but does not want to go. She states that she was instructed to call her cardiologist. The patient is completely asymptomatic and denies having any chest pain, dizziness, HAs, changes in vision, speech, or strength, or any other Sx. Patient does have a Hx of TAA. Patient is only taking lisinopril 40 mg QD. Last BMET on file was normal. Patient was last seen by Dr. Angelena Form on 04/11/2017 and was last seen in the [facility] on 09/12/2017. Patient was suppose to be taking spironolactone 25 mg QD but is no longer taking this. Reviewed with Dr. Angelena Form and patient will start spironolactone 25 mg QD and follow up with APP and BMET in 1 week. Rx sent to preferred pharmacy and appointment made for patient to see Ermalinda Barrios, PA on 02/18/19. We will check a BMET at that time. Strict ER precautions reviewed with the patient. Patient verbalized understanding and thanked me for the call.

## 2019-02-18 ENCOUNTER — Encounter: Payer: Self-pay | Admitting: Physician Assistant

## 2019-02-18 ENCOUNTER — Other Ambulatory Visit: Payer: Self-pay | Admitting: Registered Nurse

## 2019-02-18 ENCOUNTER — Other Ambulatory Visit: Payer: Self-pay | Admitting: Physical Medicine & Rehabilitation

## 2019-02-18 ENCOUNTER — Ambulatory Visit (INDEPENDENT_AMBULATORY_CARE_PROVIDER_SITE_OTHER): Payer: Medicare HMO | Admitting: Physician Assistant

## 2019-02-18 VITALS — BP 140/96 | HR 78 | Ht 61.0 in | Wt 186.6 lb

## 2019-02-18 DIAGNOSIS — I77819 Aortic ectasia, unspecified site: Secondary | ICD-10-CM | POA: Diagnosis not present

## 2019-02-18 DIAGNOSIS — I10 Essential (primary) hypertension: Secondary | ICD-10-CM

## 2019-02-18 DIAGNOSIS — I351 Nonrheumatic aortic (valve) insufficiency: Secondary | ICD-10-CM | POA: Diagnosis not present

## 2019-02-18 MED ORDER — LOSARTAN POTASSIUM 50 MG PO TABS: 50.0000 mg | ORAL_TABLET | Freq: Two times a day (BID) | ORAL | 3 refills | Status: DC

## 2019-02-18 MED ORDER — SPIRONOLACTONE 50 MG PO TABS: 50.0000 mg | ORAL_TABLET | Freq: Every day | ORAL | 3 refills | Status: DC

## 2019-02-18 NOTE — Progress Notes (Signed)
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 freely by anyone on a sodium restricted diet 6. _____ Sodium is present in table salt, food additives and as a natural component of   most foods 7. _____ Table salt is approximately 90% sodium 8. _____ Limiting sodium intake may help prevent excess fluid accumulation in the body 9. _____ On a sodium-restricted diet, seasonings such as bouillon soy sauce, and    cooking wine should be used in place of table salt 10. _____ On an ingredient list, a product which lists monosodium glutamate as the first   ingredient is an appropriate food to include on a low sodium diet  Circle the best answer(s) to the following statements (Hint: there may be more than  one correct answer)  11. On a low-sodium diet, some acceptable snack items are:    A. Olives  F. Bean dip   K. Grapefruit juice    B. Salted Pretzels G. Commercial  Popcorn   L. Canned peaches    C. Carrot Sticks  H. Bouillon   M. Unsalted nuts   D. [place] fries  I. Peanut butter crackers N. Salami   E. Sweet pickles J. Tomato Juice   O. Pizza  12.  Seasonings that may be used freely on a reduced - sodium diet include   A. Lemon wedges F.Monosodium glutamate K. Celery seed    B.Soysauce   G. Pepper   L. Mustard powder   C. Sea salt  H. Cooking wine  M. Onion flakes   D. Vinegar  E. Prepared horseradish N. Salsa   E. Sage   J. Worcestershire sauce  O. Chutney       Signed, Ermalinda Barrios, PA-C  02/18/2019 2:51 PM    [facility] [place], [place],   [id number] Phone: [phone]; Fax: [phone]

## 2019-02-18 NOTE — Patient Instructions (Addendum)
Medication Instructions:  Your physician has recommended you make the following change in your medication:   1. STOP: lisinopril  2. INCREASE: spironolactone to 50 mg daily  3. START: losartan 50 mg twice a day  Lab work: TODAY: BMET  We will recheck a BMET at your next appointment  If you have labs (blood work) drawn today and your tests are completely normal, you will receive your results only by: Marland Kitchen MyChart Message (if you have MyChart) OR . A paper copy in the mail If you have any lab test that is abnormal or we need to change your treatment, we will call you to review the results.  Testing/Procedures: None ordered  Follow-Up: . Follow up with Ermalinda Barrios, PA on  03/12/19 at 8:30 AM  Any Other Special Instructions Will Be Listed Below (If Applicable).  Two Gram Sodium Diet 2000 mg  What is Sodium? Sodium is a mineral found naturally in many foods. The most significant source of sodium in the diet is table salt, which is about 40% sodium.  Processed, convenience, and preserved foods also contain a large amount of sodium.  The body needs only 500 mg of sodium daily to function,  A normal diet provides more than enough sodium even if you do not use salt.  Why Limit Sodium? A build up of sodium in the body can cause thirst, increased blood pressure, shortness of breath, and water retention.  Decreasing sodium in the diet can reduce edema and risk of heart attack or stroke associated with high blood pressure.  Keep in mind that there are many other factors involved in these health problems.  Heredity, obesity, lack of exercise, cigarette smoking, stress and what you eat all play a role.  General Guidelines:  Do not add salt at the table or in cooking.  One teaspoon of salt contains over 2 grams of sodium.  Read food labels  Avoid processed and convenience foods  Ask your dietitian before eating any foods not dicussed in the menu planning guidelines  Consult your physician if  you wish to use a salt substitute or a sodium containing medication such as antacids.  Limit milk and milk products to 16 oz (2 cups) per day.  Shopping Hints:  READ LABELS!! "Dietetic" does not necessarily mean low sodium.  Salt and other sodium ingredients are often added to foods during processing.   Menu Planning Guidelines Food Group Choose More Often Avoid  Beverages (see also the milk group All fruit juices, low-sodium, salt-free vegetables juices, low-sodium carbonated beverages Regular vegetable or tomato juices, commercially softened water used for drinking or cooking  Breads and Cereals Enriched white, wheat, rye and pumpernickel bread, hard rolls and dinner rolls; muffins, cornbread and waffles; most dry cereals, cooked cereal without added salt; unsalted crackers and breadsticks; low sodium or homemade bread crumbs Bread, rolls and crackers with salted tops; quick breads; instant hot cereals; pancakes; commercial bread stuffing; self-rising flower and biscuit mixes; regular bread crumbs or cracker crumbs  Desserts and Sweets Desserts and sweets mad with mild should be within allowance Instant pudding mixes and cake mixes  Fats Butter or margarine; vegetable oils; unsalted salad dressings, regular salad dressings limited to 1 Tbs; light, sour and heavy cream Regular salad dressings containing bacon fat, bacon bits, and salt pork; snack dips made with instant soup mixes or processed cheese; salted nuts  Fruits Most fresh, frozen and canned fruits Fruits processed with salt or sodium-containing ingredient (some dried fruits are processed with  sodium sulfites        Vegetables Fresh, frozen vegetables and low- sodium canned vegetables Regular canned vegetables, sauerkraut, pickled vegetables, and others prepared in brine; frozen vegetables in sauces; vegetables seasoned with ham, bacon or salt pork  Condiments, Sauces, Miscellaneous  Salt substitute with physician's approval; pepper,  herbs, spices; vinegar, lemon or lime juice; hot pepper sauce; garlic powder, onion powder, low sodium soy sauce (1 Tbs.); low sodium condiments (ketchup, chili sauce, mustard) in limited amounts (1 tsp.) fresh ground horseradish; unsalted tortilla chips, pretzels, potato chips, popcorn, salsa (1/4 cup) Any seasoning made with salt including garlic salt, celery salt, onion salt, and seasoned salt; sea salt, rock salt, kosher salt; meat tenderizers; monosodium glutamate; mustard, regular soy sauce, barbecue, sauce, chili sauce, teriyaki sauce, steak sauce, Worcestershire sauce, and most flavored vinegars; canned gravy and mixes; regular condiments; salted snack foods, olives, picles, relish, horseradish sauce, catsup   Food preparation: Try these seasonings Meats:    Pork Sage, onion Serve with applesauce  Chicken Poultry seasoning, thyme, parsley Serve with cranberry sauce  Lamb Curry powder, rosemary, garlic, thyme Serve with mint sauce or jelly  Veal Marjoram, basil Serve with current jelly, cranberry sauce  Beef Pepper, bay leaf Serve with dry mustard, unsalted chive butter  Fish [place] leaf, dill Serve with unsalted lemon butter, unsalted parsley butter  Vegetables:    Asparagus Lemon juice   Broccoli Lemon juice   Carrots Mustard dressing parsley, mint, nutmeg, glazed with unsalted butter and sugar   Green beans Marjoram, lemon juice, nutmeg,dill seed   Tomatoes Basil, marjoram, onion   Spice /blend for [employer]" 4 tsp ground thyme 1 tsp ground sage 3 tsp ground rosemary 4 tsp ground marjoram   Test your knowledge 1. A product that says "Salt Free" may still contain sodium. True or False 2. Garlic Powder and Hot Pepper Sauce an be used as alternative seasonings.True or False 3. Processed foods have more sodium than fresh foods.  True or False 4. Canned Vegetables have less sodium than froze True or False  WAYS TO DECREASE YOUR SODIUM INTAKE 1. Avoid the use of added salt in cooking  and at the table.  Table salt (and other prepared seasonings which contain salt) is probably one of the greatest sources of sodium in the diet.  Unsalted foods can gain flavor from the sweet, sour, and butter taste sensations of herbs and spices.  Instead of using salt for seasoning, try the following seasonings with the foods listed.  Remember: how you use them to enhance natural food flavors is limited only by your creativity... Allspice-Meat, fish, eggs, fruit, peas, red and yellow vegetables Almond Extract-Fruit baked goods Anise Seed-Sweet breads, fruit, carrots, beets, cottage cheese, cookies (tastes like licorice) Basil-Meat, fish, eggs, vegetables, rice, vegetables salads, soups, sauces Bay Leaf-Meat, fish, stews, poultry Burnet-Salad, vegetables (cucumber-like flavor) Caraway Seed-Bread, cookies, cottage cheese, meat, vegetables, cheese, rice Cardamon-Baked goods, fruit, soups Celery Powder or seed-Salads, salad dressings, sauces, meatloaf, soup, bread.Do not use  celery salt Chervil-Meats, salads, fish, eggs, vegetables, cottage cheese (parsley-like flavor) Chili Power-Meatloaf, chicken cheese, corn, eggplant, egg dishes Chives-Salads cottage cheese, egg dishes, soups, vegetables, sauces Cilantro-Salsa, casseroles Cinnamon-Baked goods, fruit, pork, lamb, chicken, carrots Cloves-Fruit, baked goods, fish, pot roast, green beans, beets, carrots Coriander-Pastry, cookies, meat, salads, cheese (lemon-orange flavor) Cumin-Meatloaf, fish,cheese, eggs, cabbage,fruit pie (caraway flavor) Curry Powder-Meat, fruit, eggs, fish, poultry, cottage cheese, vegetables Dill Seed-Meat, cottage cheese, poultry, vegetables, fish, salads, bread Fennel Seed-Bread, cookies, apples, pork, eggs,  fish, beets, cabbage, cheese, Licorice-like flavor Garlic-(buds or powder) Salads, meat, poultry, fish, bread, butter, vegetables, potatoes.Do not  use garlic salt Ginger-Fruit, vegetables, baked goods, meat, fish,  poultry Horseradish Root-Meet, vegetables, butter Lemon Juice or Extract-Vegetables, fruit, tea, baked goods, fish salads Mace-Baked goods fruit, vegetables, fish, poultry (taste like nutmeg) Maple Extract-Syrups Marjoram-Meat, chicken, fish, vegetables, breads, green salads (taste like Sage) Mint-Tea, lamb, sherbet, vegetables, desserts, carrots, cabbage Mustard, Dry or Seed-Cheese, eggs, meats, vegetables, poultry Nutmeg-Baked goods, fruit, chicken, eggs, vegetables, desserts Onion Powder-Meat, fish, poultry, vegetables, cheese, eggs, bread, rice salads (Do not use   Onion salt) Orange Extract-Desserts, baked goods Oregano-Pasta, eggs, cheese, onions, pork, lamb, fish, chicken, vegetables, green salads Paprika-Meat, fish, poultry, eggs, cheese, vegetables Parsley Flakes-Butter, vegetables, meat fish, poultry, eggs, bread, salads (certain forms may   Contain sodium Pepper-Meat fish, poultry, vegetables, eggs Peppermint Extract-Desserts, baked goods Poppy Seed-Eggs, bread, cheese, fruit dressings, baked goods, noodles, vegetables, cottage  [employer], poultry, meat, fish, cauliflower, turnips,eggs bread Saffron-Rice, bread, veal, chicken, fish, eggs Sage-Meat, fish, poultry, onions, eggplant, tomateos, pork, stews Savory-Eggs, salads, poultry, meat, rice, vegetables, soups, pork Tarragon-Meat, poultry, fish, eggs, butter, vegetables (licorice-like flavor)  Thyme-Meat, poultry, fish, eggs, vegetables, (clover-like flavor), sauces, soups Tumeric-Salads, butter, eggs, fish, rice, vegetables (saffron-like flavor) Vanilla Extract-Baked goods, candy Vinegar-Salads, vegetables, meat marinades Walnut Extract-baked goods, candy  2. Choose your Foods Wisely   The following is a list of foods to avoid which are high in sodium:  Meats-Avoid all smoked, canned, salt cured, dried and kosher meat and fish as well as Anchovies   [employer]  meats:Bologna, Liverwurst, Pastrami Canned meat or fish  Marinated herring Caviar    Pepperoni Corned Beef   Pizza Dried chipped beef  Salami Frozen breaded fish or meat Salt pork Frankfurters or hot dogs  Sardines Gefilte fish   Sausage Ham (boiled ham, Proscuitto Smoked butt    spiced ham)   Spam      TV Dinners Vegetables Canned vegetables (Regular) Relish Canned mushrooms  Sauerkraut Olives    Tomato juice Pickles  Bakery and Dessert Products Canned puddings  Cream pies Cheesecake   Decorated cakes Cookies  Beverages/Juices Tomato juice, regular  Gatorade   V-8 vegetable juice, regular  Breads and Cereals Biscuit mixes   Salted potato chips, corn chips, pretzels Bread stuffing mixes  Salted crackers and rolls Pancake and waffle mixes Self-rising flour  Seasonings Accent    Meat sauces Barbecue sauce  Meat tenderizer Catsup    Monosodium glutamate (MSG) Celery salt   Onion salt Chili sauce   Prepared mustard Garlic salt   Salt, seasoned salt, sea salt Gravy mixes   Soy sauce Horseradish   Steak sauce Ketchup   Tartar sauce Lite salt    Teriyaki sauce Marinade mixes   Worcestershire sauce  Others Baking powder   Cocoa and cocoa mixes Baking soda   Commercial casserole mixes Candy-caramels, chocolate  Dehydrated soups    Bars, fudge,nougats  Instant rice and pasta mixes Canned broth or soup  Maraschino cherries Cheese, aged and processed cheese and cheese spreads  Learning Assessment Quiz  Indicated T (for True) or F (for False) for each of the following statements:  1. _____ Fresh fruits and vegetables and unprocessed grains are generally low in sodium 2. _____ Water may contain a considerable amount of sodium, depending on the source 3. _____ You can always tell if a food is high in sodium by tasting it 4.  _____ Certain laxatives my be high in sodium and should be avoided unless prescribed   by a physician or pharmacist 5. _____ Salt substitutes may be  used freely by anyone on a sodium restricted diet 6. _____ Sodium is present in table salt, food additives and as a natural component of   most foods 7. _____ Table salt is approximately 90% sodium 8. _____ Limiting sodium intake may help prevent excess fluid accumulation in the body 9. _____ On a sodium-restricted diet, seasonings such as bouillon soy sauce, and    cooking wine should be used in place of table salt 10. _____ On an ingredient list, a product which lists monosodium glutamate as the first   ingredient is an appropriate food to include on a low sodium diet  Circle the best answer(s) to the following statements (Hint: there may be more than one correct answer)  11. On a low-sodium diet, some acceptable snack items are:    A. Olives  F. Bean dip   K. Grapefruit juice    B. Salted Pretzels G. Commercial Popcorn   L. Canned peaches    C. Carrot Sticks  H. Bouillon   M. Unsalted nuts   D. [place] fries  I. Peanut butter crackers N. Salami   E. Sweet pickles J. Tomato Juice   O. Pizza  12.  Seasonings that may be used freely on a reduced - sodium diet include   A. Lemon wedges F.Monosodium glutamate K. Celery seed    B.Soysauce   G. Pepper   L. Mustard powder   C. Sea salt  H. Cooking wine  M. Onion flakes   D. Vinegar  E. Prepared horseradish N. Salsa   E. Sage   J. Worcestershire sauce  O. Chutney

## 2019-02-19 LAB — BASIC METABOLIC PANEL
BUN/Creatinine Ratio: 22 (ref 9–23)
BUN: 21 mg/dL (ref 6–24)
CALCIUM: 9.2 mg/dL (ref 8.7–10.2)
CO2: 25 mmol/L (ref 20–29)
Chloride: 101 mmol/L (ref 96–106)
Creatinine, Ser: 0.95 mg/dL (ref 0.57–1.00)
GFR calc Af Amer: 77 mL/min/{1.73_m2} (ref >59)
GFR calc non Af Amer: 67 mL/min/{1.73_m2} (ref >59)
GLUCOSE: 78 mg/dL (ref 65–99)
Potassium: 4.2 mmol/L (ref 3.5–5.2)
Sodium: 139 mmol/L (ref 134–144)

## 2019-02-24 ENCOUNTER — Encounter: Payer: Self-pay | Admitting: Family Medicine

## 2019-02-25 ENCOUNTER — Encounter: Payer: Self-pay | Admitting: Family Medicine

## 2019-02-25 ENCOUNTER — Other Ambulatory Visit: Payer: Self-pay

## 2019-02-25 ENCOUNTER — Other Ambulatory Visit: Payer: Self-pay | Admitting: Family Medicine

## 2019-02-25 ENCOUNTER — Ambulatory Visit (INDEPENDENT_AMBULATORY_CARE_PROVIDER_SITE_OTHER): Payer: Medicare HMO | Admitting: Family Medicine

## 2019-02-25 VITALS — BP 152/94 | HR 81 | Temp 97.8°F | Ht 61.0 in | Wt 184.0 lb

## 2019-02-25 DIAGNOSIS — J029 Acute pharyngitis, unspecified: Secondary | ICD-10-CM | POA: Insufficient documentation

## 2019-02-25 DIAGNOSIS — F331 Major depressive disorder, recurrent, moderate: Secondary | ICD-10-CM

## 2019-02-25 DIAGNOSIS — L52 Erythema nodosum: Secondary | ICD-10-CM

## 2019-02-25 DIAGNOSIS — Z1231 Encounter for screening mammogram for malignant neoplasm of breast: Secondary | ICD-10-CM

## 2019-02-25 DIAGNOSIS — D709 Neutropenia, unspecified: Secondary | ICD-10-CM | POA: Diagnosis not present

## 2019-02-25 DIAGNOSIS — L989 Disorder of the skin and subcutaneous tissue, unspecified: Secondary | ICD-10-CM

## 2019-02-25 DIAGNOSIS — R5383 Other fatigue: Secondary | ICD-10-CM | POA: Diagnosis not present

## 2019-02-25 DIAGNOSIS — J020 Streptococcal pharyngitis: Secondary | ICD-10-CM

## 2019-02-25 DIAGNOSIS — I10 Essential (primary) hypertension: Secondary | ICD-10-CM | POA: Diagnosis not present

## 2019-02-25 LAB — POCT RAPID STREP A (OFFICE): Rapid Strep A Screen: POSITIVE — AB

## 2019-02-25 MED ORDER — LOSARTAN POTASSIUM 100 MG PO TABS: 100.0000 mg | ORAL_TABLET | Freq: Every day | ORAL | 0 refills | Status: DC

## 2019-02-25 MED ORDER — LORATADINE 10 MG PO TABS: 10.0000 mg | ORAL_TABLET | Freq: Every day | ORAL | 1 refills | Status: DC | PRN

## 2019-02-25 MED ORDER — PENICILLIN V POTASSIUM 500 MG PO TABS: 500.0000 mg | ORAL_TABLET | Freq: Two times a day (BID) | ORAL | 0 refills | Status: DC

## 2019-02-25 MED ORDER — AZITHROMYCIN 250 MG PO TABS: ORAL_TABLET | ORAL | 0 refills | Status: DC

## 2019-02-25 NOTE — Assessment & Plan Note (Addendum)
BP elevated. Repeat BP improved. Cards started her on Aldactone and Losartan 50 mg BID. Unclear why Losartan was prescribed as twice daily medication for her. Pharmacy resident saw her for medication reconciliation and counseling. I recommended Losartan 100 mg qd instead of 50 mg BID. She will take 2 of her 50 mg at the same time. We will refill at 100 mg. Continue Aldactone 50 mg qd. K+ level checked today. Monitor BP at home. Return in 2 weeks for recheck. F?U with Cards as planned. I will message Cards about her meds.

## 2019-02-25 NOTE — Assessment & Plan Note (Signed)
Rapid strep positive. Penicillin avoided due to EN. Zithromax prescribed instead. F/U soon if there is no improvement.

## 2019-02-25 NOTE — Progress Notes (Signed)
Patient ID: Maria Griffith, female   DOB: 1961-01-13, 58 y.o.   MRN: [id number] Note: I called pharmacy and spoke with Barnett Applebaum to cancel her Penicillin. Barnett Applebaum confirmed cancellation . She is also aware to pick up Zpak.

## 2019-02-25 NOTE — Assessment & Plan Note (Signed)
Looks like erythema nodosum. Could be secondary to her strep throat vs sarcoidosis. See ST management. ACE level checked for sarcoidosis. Information about EN and management discussed. F?U soon if it worsen.

## 2019-02-25 NOTE — Assessment & Plan Note (Signed)
I discussed hematologist's recommendation to decrease her Lyrica. This was started by her pain specialist for chronic pain. She will discuss with them at next visit to adjust her pain regimen. F/U with oncology as planned.

## 2019-02-25 NOTE — Addendum Note (Signed)
Addended by: Andrena Mews T on: 02/25/2019 03:03 PM   Modules accepted: Orders

## 2019-02-25 NOTE — Assessment & Plan Note (Signed)
Currently on Trazodone qhs. I recommended medication adjustment and Psychology counseling. She declined both. She stated that she is handling this well with her current Trazodone regimen. Monitor closely. ED precaution in case of suicidal ideation. She is not dangerous to self or other currently.
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
## 2019-02-25 NOTE — Patient Instructions (Addendum)
Erythema Nodosum Erythema nodosum is a skin condition in which patches of fat under the skin of the lower legs become inflamed. This causes painful bumps (nodules) to form. What are the causes? This condition may be caused by:  Infections. Strep throat (pharyngitis) is a common cause.  Certain medicines, especially birth control pills, penicillin, and sulfa medicines.  Sarcoidosis.  Pregnancy.  Certain inflammatory conditions, such as Crohn's disease.  Certain cancers. In some cases, the cause may not be known. What increases the risk? This condition is more likely to develop in young adult women. What are the signs or symptoms? The nodules from erythema nodosum:  Look like raised bruises and are tender to the touch.  Usually appear on the front of the lower legs (shins) and may also appear on the arms or the abdomen.  Gradually change in color from pink to brown.  Leave a dark mark that fades away after several months. Other symptoms besides nodules may include:  Fever.  Fatigue.  Joint pain. How is this diagnosed? This condition is often diagnosed based on your symptoms. You may have a physical exam, X-rays, and blood tests to help find the cause of your erythema nodosum. A skin sample may be removed (skin biopsy) to be examined by a specialist (pathologist). How is this treated? Treatment for this condition depends on the cause. The nodules usually go away after the underlying cause is treated, such as after medicine is used to fight infection. Treatment may also include:  NSAIDs for pain and inflammation.  Potassium iodide supplements.  Steroid medicines.  Rest. Follow these instructions at home: Activity  Rest and return to your normal activities as told by your health care provider. Ask your health care provider what activities are safe for you.  Avoid very intense (vigorous) exercise until your symptoms go away. General instructions   Take  over-the-counter and prescription medicines only as told by your health care provider. If you were prescribed antibiotic medicine, take or apply it as told by your health care provider. Do not stop using the antibiotic even if you start to feel better  If directed, put ice on affected areas to help relieve pain or inflammation: ? Put ice in a plastic bag. ? Place a towel between your skin and the bag. ? Apply ice 2-3 times a day. Do not apply ice for more than 20 minutes at a time.  If possible, raise (elevate) the affected area above the level of your heart when you are sitting or lying down. This may help with pain and inflammation.  Avoid scratching your skin.  To relieve itchiness, make a paste with dry oatmeal and warm water, then put the paste on itchy areas. Let the paste dry, remove it, and then apply moisturizer. You may do this 2-3 times a day, or as needed.  Keep all follow-up visits as told by your health care provider. This is important. Contact a health care provider if you:  Have symptoms that do not get better with treatment and home care.  Have a fever that does not go away. Get help right away if you:  Have pain that gets worse.  Have a sore throat.  Vomit more than one time. Summary  Erythema nodosum is a skin condition that causes painful bumps (nodules) to form.  The bumps usually appear on the front of the lower legs (shins).  Avoid very intense (vigorous) exercise until your symptoms go away.  Contact a health care provider if   Vomit more than one time.  Summary   Erythema nodosum is a skin condition that causes painful bumps (nodules) to form.   The bumps usually appear on the front of the lower legs (shins).   Avoid very intense (vigorous) exercise until your symptoms go away.   Contact a health care provider if you have a fever or if your symptoms do not improve.  This information is not intended to replace advice given to you by your health care provider. Make sure you discuss any questions you have with your health care provider.  Document Released: 01/11/2005 Document Revised: 10/09/2017 Document Reviewed: 10/09/2017  Elsevier Interactive Patient Education  2019 [employer]

## 2019-02-25 NOTE — Assessment & Plan Note (Signed)
Chronic recurrent. Likely multifactorial. (Medications, stress, depression, strep pharyngitis) Check labs today. Recent ANA and TSH were normal. Graded exercise recommended. MVI to improve appetite and energy. F/U as needed.

## 2019-02-26 ENCOUNTER — Telehealth: Payer: Self-pay | Admitting: Family Medicine

## 2019-02-26 LAB — CMP14+EGFR
ALBUMIN: 4.1 g/dL (ref 3.8–4.9)
ALT: 19 IU/L (ref 0–32)
AST: 20 IU/L (ref 0–40)
Albumin/Globulin Ratio: 1.4 (ref 1.2–2.2)
Alkaline Phosphatase: 84 IU/L (ref 39–117)
BUN/Creatinine Ratio: 19 (ref 9–23)
BUN: 19 mg/dL (ref 6–24)
Bilirubin Total: 0.2 mg/dL (ref 0.0–1.2)
CO2: 24 mmol/L (ref 20–29)
Calcium: 9.3 mg/dL (ref 8.7–10.2)
Chloride: 101 mmol/L (ref 96–106)
Creatinine, Ser: 1.01 mg/dL — ABNORMAL HIGH (ref 0.57–1.00)
GFR calc Af Amer: 72 mL/min/{1.73_m2} (ref >59)
GFR calc non Af Amer: 62 mL/min/{1.73_m2} (ref >59)
Globulin, Total: 2.9 g/dL (ref 1.5–4.5)
Glucose: 94 mg/dL (ref 65–99)
Potassium: 4.8 mmol/L (ref 3.5–5.2)
Sodium: 138 mmol/L (ref 134–144)
Total Protein: 7 g/dL (ref 6.0–8.5)

## 2019-02-26 LAB — CBC WITH DIFFERENTIAL/PLATELET
Basophils Absolute: 0 10*3/uL (ref 0.0–0.2)
Basos: 1 %
EOS (ABSOLUTE): 0.2 10*3/uL (ref 0.0–0.4)
EOS: 6 %
HEMATOCRIT: 35.8 % (ref 34.0–46.6)
Hemoglobin: 11.9 g/dL (ref 11.1–15.9)
Immature Grans (Abs): 0 10*3/uL (ref 0.0–0.1)
Immature Granulocytes: 0 %
Lymphocytes Absolute: 1.4 10*3/uL (ref 0.7–3.1)
Lymphs: 39 %
MCH: 28.5 pg (ref 26.6–33.0)
MCHC: 33.2 g/dL (ref 31.5–35.7)
MCV: 86 fL (ref 79–97)
Monocytes Absolute: 0.3 10*3/uL (ref 0.1–0.9)
Monocytes: 7 %
Neutrophils Absolute: 1.7 10*3/uL (ref 1.4–7.0)
Neutrophils: 47 %
Platelets: 240 10*3/uL (ref 150–450)
RBC: 4.17 x10E6/uL (ref 3.77–5.28)
RDW: 13.7 % (ref 11.7–15.4)
WBC: 3.6 10*3/uL (ref 3.4–10.8)

## 2019-02-26 LAB — ANGIOTENSIN CONVERTING ENZYME: Angio Convert Enzyme: 63 U/L (ref 14–82)

## 2019-02-26 NOTE — Telephone Encounter (Signed)
Spoke with pt regarding message left by Dr. Gwendlyn Deutscher. Let pt know that her lab work was fine, and that her other test were still pending and that Dr. Gwendlyn Deutscher would notify her  once those results come in. Pt also stated that the Rx that was written for her strep throat was only for 5 days and was suppose to be for 10 days. She wanted Dr. Gwendlyn Deutscher to see if she could send in the rest? Salvatore Marvel, CMA

## 2019-02-26 NOTE — Telephone Encounter (Signed)
Pt is returning Dr. Macario Golds call and would like for someone to call her with the results.

## 2019-02-26 NOTE — Telephone Encounter (Signed)
Patient informed of results. Ashe Gago,CMA  

## 2019-02-26 NOTE — Telephone Encounter (Signed)
HIPAA compliant callback message left.  Please let her know that her lab looks fine. The Sarcoidosis test is pending. I will call with the result.

## 2019-02-26 NOTE — Telephone Encounter (Signed)
Also let her know that the test for Sarcoidosis came back fine. Thanks.

## 2019-02-26 NOTE — Telephone Encounter (Signed)
Please let her know that Zithromax is for 5 days. Thanks.

## 2019-02-28 ENCOUNTER — Encounter: Payer: Self-pay | Admitting: Physician Assistant

## 2019-03-05 ENCOUNTER — Telehealth: Payer: Self-pay | Admitting: Physician Assistant

## 2019-03-05 NOTE — Telephone Encounter (Signed)
Called patient about her upcoming appointment 03/12/2019.  Left a message for her to call us back to see how she is doing and if she is keeping track of her blood pressures at home and if she needs to come in for follow-up.  Saw that she did have labs by PCP that were stable.  She may need blood pressure medication adjustment though.

## 2019-03-06 ENCOUNTER — Telehealth: Payer: Self-pay

## 2019-03-06 ENCOUNTER — Telehealth: Payer: Self-pay | Admitting: Physician Assistant

## 2019-03-06 ENCOUNTER — Other Ambulatory Visit: Payer: Self-pay | Admitting: Family Medicine

## 2019-03-06 MED ORDER — BENZONATATE 100 MG PO CAPS: 100.0000 mg | ORAL_CAPSULE | Freq: Three times a day (TID) | ORAL | 0 refills | Status: DC | PRN

## 2019-03-06 NOTE — Telephone Encounter (Signed)
Per pt call - stated the new BP is making her cough even more and would like to speake to someone about this concern.  Pt would like to know if she should keep her appt on the 03/11/19.  Pt is coughing a lot.

## 2019-03-06 NOTE — Telephone Encounter (Signed)
Pt called nurse line stating her throat feels better, however now she is experiencing a dr cough. Pt coughed a lot while on the phone. Pt stated her cardiologist changed her BP meds to Losartan. Pt stated the cough started around that time, however could be a coincidence. Pt denies any fevers, chills, or SOB. I see in epic where she has sent similar message to her cardiologist. Will forward to PCP.

## 2019-03-06 NOTE — Telephone Encounter (Addendum)
Please let patient know that since she is just getting over a viral illness as well as strep pharyngitis, she could be having post viral cough. However, it could also be due to her Losartan. Since Cardiologist recently changed this medication for her, I will defer recommendations to them for now. Please have her contact Cards office today. I know she has follow-up with them in few days. In the meantime, I will escribe Tessalone pearle for cough. If there is no improvement with Tessalon, she may benefit from changing Losartan to another regimen. She may come in soon if necessary for evaluation.

## 2019-03-06 NOTE — Telephone Encounter (Signed)
Called and spoke to patient. Patient states that her SBP has been fluctuating between 150-200s. Patient denies having any HAs, lightheadedness, dizziness, or any other Sx other than a cough. Patient states that she was on lisinopril in the past and was switched to losartan due to cough. She states that she continues to have a cough and heard that losartan could cause this too. Made patient aware that it is less likely to cause a cough.   Patient is coughing every other word. Patient denies being in contact with someone that was recently sick with fever/cough or confirmed to have the [place] virus. Patient denies having  fever (100.4 or greater) or shortness of breath. Patient recently diagnosed with Strep Throat.   Patient is currently scheduled to see Ermalinda Barrios, PA on 3/25. Discussed with Ermalinda Barrios, PA and we will cancel patient's appointment and have her start hydralazine 25 mg TID and she will continue taking her losartan 100 mg QD and spironolactone 50 mg QD. Patient will monitor BP x 1 week and call back and let us know the readings. Patient has been instructed to reach out to PCP regarding her cough. Patient will follow up in 6 weeks. Appointment made for 4/29.

## 2019-03-07 ENCOUNTER — Telehealth: Payer: Self-pay | Admitting: Family Medicine

## 2019-03-07 NOTE — Telephone Encounter (Signed)
Spoke with pt regarding her cough. Pt stated that she did call her cardiologist about her cough, and that they ended up putting her on a 3rd BP medication. I informed her that Dr. Gwendlyn Deutscher sent in a RX for Tessalone pearle to see if that will help with her cough. I also suggested that she let her Cardiologist know about the Tessalone perales and to call them as well if since they were the ones that put her on the BP meds. I informed Dr. Gwendlyn Deutscher of this , and she wants pt to follow up with Cardiologist as well, but wants pt to give her call if she feels she needs to be seen. Salvatore Marvel, CMA

## 2019-03-07 NOTE — Telephone Encounter (Signed)
Maria Griffith informed me that she spoke with [name], who advised her that her Cardiologist wanted her to continue Losartan and started her on a third BP medication. She wants to know if she should start the third medication.  Upon reviewing Card's note, it seems she had told them that her BP is running high in the 950-722V systolic, hence the reason they added a third regimen.  I advised her that it is unsafe to have two different providers take care of medication management of the same health problem. If the Cardiologist is currently managing her BP, I will defer management and plan to her.  I did advise her to monitor her BP closely by checking three times daily and keep a log of it on her current regimen. Use Tessalon as instructed. If no improvement in her symptoms or she will like me to manage her BP going forward, she should contact me soon.  She agreed with the plan. For now, she will follow-up with her Cardiologist.

## 2019-03-11 ENCOUNTER — Encounter: Payer: No Typology Code available for payment source | Admitting: Registered Nurse

## 2019-03-12 ENCOUNTER — Ambulatory Visit: Payer: Medicare HMO | Admitting: Physician Assistant

## 2019-03-19 ENCOUNTER — Other Ambulatory Visit: Payer: Self-pay | Admitting: Family Medicine

## 2019-03-21 ENCOUNTER — Ambulatory Visit: Payer: Self-pay

## 2019-03-26 NOTE — Telephone Encounter (Signed)
Called patient to follow up with patient regarding her BP. She states that her BP is better but she did not have any readings with her to report. Patient will call back if her BP becomes elevated and will keep her f/u appt. Visit has been transitioned to VIDEO Visit.
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
## 2019-04-16 ENCOUNTER — Telehealth (INDEPENDENT_AMBULATORY_CARE_PROVIDER_SITE_OTHER): Payer: Medicare HMO | Admitting: Physician Assistant

## 2019-04-16 ENCOUNTER — Other Ambulatory Visit: Payer: Self-pay

## 2019-04-16 ENCOUNTER — Encounter: Payer: Self-pay | Admitting: Physician Assistant

## 2019-04-16 VITALS — BP 144/99 | Ht 61.0 in | Wt 184.0 lb

## 2019-04-16 DIAGNOSIS — I351 Nonrheumatic aortic (valve) insufficiency: Secondary | ICD-10-CM

## 2019-04-16 DIAGNOSIS — I10 Essential (primary) hypertension: Secondary | ICD-10-CM | POA: Diagnosis not present

## 2019-04-16 DIAGNOSIS — I77819 Aortic ectasia, unspecified site: Secondary | ICD-10-CM

## 2019-04-16 MED ORDER — HYDRALAZINE HCL 25 MG PO TABS: 25.0000 mg | ORAL_TABLET | Freq: Three times a day (TID) | ORAL | 3 refills | Status: DC

## 2019-04-16 MED ORDER — LOSARTAN POTASSIUM 100 MG PO TABS: 100.0000 mg | ORAL_TABLET | Freq: Every day | ORAL | 3 refills | Status: DC

## 2019-04-16 NOTE — Patient Instructions (Signed)
Medication Instructions:  Your physician has recommended you make the following change in your medication:   TAKE: hydralazine 25 mg three times a day  TAKE: losartan 100 mg once a day  Lab work: None Ordered  If you have labs (blood work) drawn today and your tests are completely normal, you will receive your results only by: Marland Kitchen MyChart Message (if you have MyChart) OR . A paper copy in the mail If you have any lab test that is abnormal or we need to change your treatment, we will call you to review the results.  Testing/Procedures: None ordered  Follow-Up: . Follow up with Maria Barrios, PA  Any Other Special Instructions Will Be Listed Below (If Applicable).

## 2019-04-17 ENCOUNTER — Encounter: Payer: Self-pay | Admitting: Family Medicine

## 2019-04-17 ENCOUNTER — Other Ambulatory Visit: Payer: Self-pay

## 2019-04-17 ENCOUNTER — Telehealth (INDEPENDENT_AMBULATORY_CARE_PROVIDER_SITE_OTHER): Payer: Medicare HMO | Admitting: Family Medicine

## 2019-04-17 DIAGNOSIS — I10 Essential (primary) hypertension: Secondary | ICD-10-CM | POA: Diagnosis not present

## 2019-04-17 DIAGNOSIS — R499 Unspecified voice and resonance disorder: Secondary | ICD-10-CM | POA: Insufficient documentation

## 2019-04-17 DIAGNOSIS — R5383 Other fatigue: Secondary | ICD-10-CM

## 2019-04-17 DIAGNOSIS — R635 Abnormal weight gain: Secondary | ICD-10-CM

## 2019-04-17 NOTE — Assessment & Plan Note (Signed)
We will reassess during her office visit.

## 2019-04-17 NOTE — Assessment & Plan Note (Signed)
Seems to have BP discrepancy in her arms. I recommended in office visit to check her BP. Continue current regimen and close home BP monitoring. Appointment made for her to come in next Tuesday.

## 2019-04-17 NOTE — Assessment & Plan Note (Signed)
Evaluated multiple times in the past. ?? Medication side effects. No anemia per lab work. She was previously neutropenic but that has since improved and she was also evaluated by the hematologist. Continue monitoring vitals closely at home. I will readdress during her visit next Tuesday.

## 2019-04-17 NOTE — Assessment & Plan Note (Signed)
Likely allergies. Continue Loratadine. Consider ENT eval if it persists. She agreed with the plan.

## 2019-04-17 NOTE — Progress Notes (Signed)
[facility] Telemedicine Visit  Patient consented to have virtual visit. Method of visit: Video was attempted, but technology challenges prevented patient from using video, so visit was conducted via telephone.  Encounter participants: Patient: Maria Griffith - located at home Provider: Andrena Mews - located at office Others (if applicable): NA  Chief Complaint: F/U BP, Fatigue  HPI: HTN: She is compliant with all her meds as instructed by her cardiologist. She checks her BP at home regularly.  Fatigue:Still feels fatigues intermittently. Apart from medication changes in the last few months, she denies any change in diet regimen.  Weight gain:She feels like she has gained some weight in the last few weeks. She did not weigh herself at home. Horse voice:C/O voice change due to excessive cough while she was on Lisinopril. Her voice improved after she completed treatment for her strep throat. However, she coughs intermittently which aggravates the change in her voice. No SOB, she chokes at times whenever she coughs. No other GI or GU symptoms. HM:Due for mammogram. She stated the breast center called to cancel her appointment due to COVID-19.  ROS: per HPI  Pertinent PMHx:Problem list reviewed  Exam:  Respiratory: No resp distress  Assessment/Plan:  Essential hypertension Seems to have BP discrepancy in her arms. I recommended in office visit to check her BP. Continue current regimen and close home BP monitoring. Appointment made for her to come in next Tuesday.  Fatigue Evaluated multiple times in the past. ?? Medication side effects. No anemia per lab work. She was previously neutropenic but that has since improved and she was also evaluated by the hematologist. Continue monitoring vitals closely at home. I will readdress during her visit next Tuesday.  Weight gain We will reassess during her office visit.  Hoarseness or changing voice Likely  allergies. Continue Loratadine. Consider ENT eval if it persists. She agreed with the plan.   She is advised to contact breast imaging center to reschedule her mammogram appointment soon.  Time spent during visit with patient: 25 minutes

## 2019-04-21 ENCOUNTER — Other Ambulatory Visit: Payer: Self-pay | Admitting: Registered Nurse

## 2019-04-21 NOTE — Telephone Encounter (Signed)
Placed a call to [name] regarding her Hydrocodone, she cancelled her appointment last month due to COVID-19. She never called for re-fill. She states she was trying to stretch out her Hydrocodone, and couldn't remember if she called our office for refill. Also reports she has been ill and has a follow up appointment with her PCP. No telephone encounter noted. Hydrocodone was refilled, She verbalizes understanding.

## 2019-04-22 ENCOUNTER — Ambulatory Visit: Payer: Medicare HMO | Admitting: Family Medicine

## 2019-04-23 ENCOUNTER — Ambulatory Visit (INDEPENDENT_AMBULATORY_CARE_PROVIDER_SITE_OTHER): Payer: Medicare HMO | Admitting: Family Medicine

## 2019-04-23 ENCOUNTER — Other Ambulatory Visit: Payer: Self-pay

## 2019-04-23 ENCOUNTER — Encounter: Payer: Self-pay | Admitting: Family Medicine

## 2019-04-23 ENCOUNTER — Ambulatory Visit: Payer: Medicare HMO | Admitting: Licensed Clinical Social Worker

## 2019-04-23 VITALS — BP 150/80 | HR 99 | Ht 61.0 in | Wt 182.5 lb

## 2019-04-23 DIAGNOSIS — F331 Major depressive disorder, recurrent, moderate: Secondary | ICD-10-CM

## 2019-04-23 DIAGNOSIS — R635 Abnormal weight gain: Secondary | ICD-10-CM

## 2019-04-23 DIAGNOSIS — E559 Vitamin D deficiency, unspecified: Secondary | ICD-10-CM | POA: Diagnosis not present

## 2019-04-23 DIAGNOSIS — I10 Essential (primary) hypertension: Secondary | ICD-10-CM | POA: Diagnosis not present

## 2019-04-23 DIAGNOSIS — E8889 Other specified metabolic disorders: Secondary | ICD-10-CM

## 2019-04-23 DIAGNOSIS — R5383 Other fatigue: Secondary | ICD-10-CM | POA: Diagnosis not present

## 2019-04-23 DIAGNOSIS — Z79899 Other long term (current) drug therapy: Secondary | ICD-10-CM | POA: Diagnosis not present

## 2019-04-23 NOTE — Assessment & Plan Note (Signed)
Patient preferred no pharmacologic intervention. She declined increasing her Trazodone dose today. I connected her with our [facility] for counseling and Psych referral. F/U in 4 weeks. She is not a danger to self or others.

## 2019-04-23 NOTE — BH Specialist Note (Addendum)
   Consult Note for Brief IBH Introduction  04/23/2019 Name: Maria Griffith MRN: [id number] DOB: 12-17-1961  Referred by: Kinnie [name], MD Reason for referral : Care Coordination (connect with psychiatry and ongoing counseling )  Maria Griffith is a 58 y.o. year old female who sees Kinnie [name], MD for primary care.  LCSW received consult to assist patient with the above referral.  Patient is pleasant and engaged in conversation.  Intervention:. LCSW provided client with information about local psychiatry and counseling resources.   Follow Up Plan:  1. Patient will call providers on the list provided 2. LCSW will F/U with patient in 1 week to make sure she is connected to resources.  Casimer Lanius, LCSW [facility]   [phone] 10:59 AM

## 2019-04-23 NOTE — Assessment & Plan Note (Signed)
Her weight has actually been stable over the last few months. Monitor closely. Continue healthy diet and exercise as tolerated,

## 2019-04-23 NOTE — Assessment & Plan Note (Signed)
BP borderline high with no reading discrepancy in both arms. Continue med adjustment per Cards.
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
## 2019-04-23 NOTE — Assessment & Plan Note (Signed)
Likely multifactorial. Pharmacologic agents (Aldactone, Trazodone, Narco, Tizanidine) vs MDD vs Vitamin deficiency. She has hx of Vit D deficiency. We will recheck levels today including B12. Recent TSH was normal. Monitor closely with graded exercise.

## 2019-04-23 NOTE — Patient Instructions (Signed)

## 2019-04-24 ENCOUNTER — Telehealth: Payer: Self-pay | Admitting: *Deleted

## 2019-04-24 LAB — VITAMIN B12: Vitamin B-12: 601 pg/mL (ref 232–1245)

## 2019-04-24 LAB — VITAMIN D 25 HYDROXY (VIT D DEFICIENCY, FRACTURES): Vit D, 25-Hydroxy: 20.6 ng/mL — ABNORMAL LOW (ref 30.0–100.0)

## 2019-04-24 NOTE — Telephone Encounter (Signed)
-----   Message from Kinnie [name], MD sent at 04/24/2019  2:27 PM EDT ----- Please advise patient that her Vitamin D level is still low. We will start her on Vit D supplement once weekly. I have escribed her meds.  Also, Vitamin B12 is normal.

## 2019-04-24 NOTE — Telephone Encounter (Signed)
Patient informed of results.  She will begin the vitamin D supplement.  Jazmin Hartsell,CMA

## 2019-04-25 ENCOUNTER — Encounter: Payer: Self-pay | Admitting: Physical Medicine & Rehabilitation

## 2019-04-25 ENCOUNTER — Encounter
Payer: No Typology Code available for payment source | Attending: Physical Medicine & Rehabilitation | Admitting: Physical Medicine & Rehabilitation

## 2019-04-25 ENCOUNTER — Other Ambulatory Visit: Payer: Self-pay | Admitting: Family Medicine

## 2019-04-25 ENCOUNTER — Other Ambulatory Visit: Payer: Self-pay

## 2019-04-25 VITALS — BP 150/80 | Ht 61.0 in | Wt 182.0 lb

## 2019-04-25 DIAGNOSIS — M961 Postlaminectomy syndrome, not elsewhere classified: Secondary | ICD-10-CM

## 2019-04-25 DIAGNOSIS — G243 Spasmodic torticollis: Secondary | ICD-10-CM

## 2019-04-25 MED ORDER — VITAMIN D (ERGOCALCIFEROL) 1.25 MG (50000 UNIT) PO CAPS: 50000.0000 [IU] | ORAL_CAPSULE | ORAL | 1 refills | Status: DC

## 2019-04-25 MED ORDER — TIZANIDINE HCL 4 MG PO TABS: ORAL_TABLET | ORAL | 2 refills | Status: DC

## 2019-04-25 MED ORDER — PREGABALIN 100 MG PO CAPS: 100.0000 mg | ORAL_CAPSULE | Freq: Three times a day (TID) | ORAL | 3 refills | Status: DC

## 2019-04-25 NOTE — Telephone Encounter (Signed)
Pt never received Vit D supplement.  Will forward to Md to advise. Christen Bame, CMA

## 2019-04-25 NOTE — Telephone Encounter (Signed)
Pt informed.  Jessica Fleeger, CMA  

## 2019-04-25 NOTE — Telephone Encounter (Signed)
I have escribed med to her [facility]. Thanks.
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
## 2019-04-30 ENCOUNTER — Telehealth: Payer: Self-pay | Admitting: Licensed Clinical Social Worker

## 2019-04-30 ENCOUNTER — Encounter: Payer: Self-pay | Admitting: Physician Assistant

## 2019-04-30 ENCOUNTER — Telehealth: Payer: Self-pay

## 2019-04-30 ENCOUNTER — Other Ambulatory Visit: Payer: Self-pay

## 2019-04-30 ENCOUNTER — Other Ambulatory Visit: Payer: Self-pay | Admitting: Family Medicine

## 2019-04-30 ENCOUNTER — Telehealth (INDEPENDENT_AMBULATORY_CARE_PROVIDER_SITE_OTHER): Payer: Medicare HMO | Admitting: Physician Assistant

## 2019-04-30 VITALS — BP 221/127 | HR 84 | Ht 61.0 in | Wt 180.0 lb

## 2019-04-30 DIAGNOSIS — I10 Essential (primary) hypertension: Secondary | ICD-10-CM | POA: Diagnosis not present

## 2019-04-30 DIAGNOSIS — I351 Nonrheumatic aortic (valve) insufficiency: Secondary | ICD-10-CM

## 2019-04-30 DIAGNOSIS — I77810 Thoracic aortic ectasia: Secondary | ICD-10-CM

## 2019-04-30 MED ORDER — LOSARTAN POTASSIUM 100 MG PO TABS: 100.0000 mg | ORAL_TABLET | Freq: Every day | ORAL | 3 refills | Status: DC

## 2019-04-30 MED ORDER — HYDRALAZINE HCL 50 MG PO TABS: 50.0000 mg | ORAL_TABLET | Freq: Three times a day (TID) | ORAL | 3 refills | Status: DC

## 2019-04-30 MED ORDER — SPIRONOLACTONE 50 MG PO TABS: 50.0000 mg | ORAL_TABLET | Freq: Every day | ORAL | 3 refills | Status: DC

## 2019-04-30 NOTE — Patient Instructions (Addendum)
Medication Instructions:  Your physician has recommended you make the following change in your medication:   INCREASE: Hydralazine to 50 mg three times a day   Lab work: None Ordered  If you have labs (blood work) drawn today and your tests are completely normal, you will receive your results only by: Marland Kitchen MyChart Message (if you have MyChart) OR . A paper copy in the mail If you have any lab test that is abnormal or we need to change your treatment, we will call you to review the results.  Testing/Procedures: None ordered  Follow-Up: . WE ARE [place] OUT TO CHECK YOUR BLOOD PRESSURE. FOLLOW-UP WILL BE DETERMINED BASED ON READING.  Any Other Special Instructions Will Be Listed Below (If Applicable).

## 2019-04-30 NOTE — Telephone Encounter (Signed)
CSW referred to assist patient with obtaining a BP cuff. CSW contacted patient to inform cuff will be delivered to home next week. Patient grateful for support and assistance. CSW available as needed. Jackie Savva Beamer, LCSW, CCSW-MCS [phone]  

## 2019-04-30 NOTE — Telephone Encounter (Signed)
Called and spoke to patient. Heather RN from [facility] in the home. She states that patient's BP is down at 136/92 manual, 130/90 automatic, 134/87 with patient's wrist cuff. [name] states that she will perform an assessment, med reconciliation, and will check on patient tomorrow. Made Ermalinda Barrios, PA aware and she is in agreement with the plan.
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
## 2019-04-30 NOTE — Telephone Encounter (Signed)
Called and spoke to patient. She states that the home health nurse called her to come out to check her BP and she told her that her son was taking [facility] to [facility] to get [facility]. Patient states that [facility] was 197/122 which is down from 221/157 at her virtual visit. Instructed patient to go to the ER as she is at risk for stroke with her BP that high. Patient states that she does not want to go to the ER that she took her second hydralazine about an hour ago and she wants to wait to see if it comes back down. Patient admits to having a HA, but denies any other Sx (ie. changes in vision, speech, or strength). Advised the patient again to go to the ER as her BP is too high and she is at risk for stroke. Patient states that her son has already gone and she wants to lie down and see if it comes down. Offered to call EMS and patient refused. Patient asks that I call her back to see if comes down. Made patient aware that she needs to be seen in the ER. I will call and check on patient again at 5:00 PM.

## 2019-05-01 ENCOUNTER — Telehealth: Payer: Self-pay | Admitting: Licensed Clinical Social Worker

## 2019-05-01 ENCOUNTER — Telehealth: Payer: Self-pay

## 2019-05-01 MED ORDER — HYDRALAZINE HCL 50 MG PO TABS: 25.0000 mg | ORAL_TABLET | Freq: Three times a day (TID) | ORAL | 3 refills | Status: DC

## 2019-05-01 NOTE — Telephone Encounter (Signed)
Called and spoke to patient. Made her aware that Ermalinda Barrios, PA wants to decrease her Hydralazine back to 25 mg TID and we will continue to monitor. [name], [facility] nurse will be going out tomorrow to recheck. Patient verbalized understanding and thanked me for the call.

## 2019-05-01 NOTE — Telephone Encounter (Signed)
[facility] [place] nurse called and states that the patient's BP after her meds this morning is 90/56 manual and automatic. Wrist cuff 130/73 but does not correlate with other cuffs. Patient's BP was 140/94 before her meds this morning. She took losartan 50 mg, spironolactone 50 mg, and hydralazine 50 mg. She has not eaten anything and has only had a cup of juice. Patient denies dizziness, lightheadedness, or any other Sx. [name] states that the patient is not orthostatic and that she obtained a rhythm strip and the patient was NSR in the 80s. Instructed for patient to hold her hydralazine if her SBP<110. [name] states that she will go back out and check on patient tomorrow.Will forward to Ermalinda Barrios, [place] for review.

## 2019-05-01 NOTE — Telephone Encounter (Signed)
Ok, that's really low compared to what we thought it was running. Decrease hydralazine to 25 mg tid and we will continue to monitor. Thanks

## 2019-05-01 NOTE — Telephone Encounter (Signed)
Care Coordination  F/U Telephone Outreach Note 05/01/2019 Name: Maria Griffith MRN: [id number] DOB: [birth year]  Referred by: PCP, Dr.Eniola during office visit for resources to connect with psychiatry and counseling.  F/U call to [name] for brief IBH intro in office Confirmed patient's address: Yes    Confirmed patient's date of birth  Yes   Patient is pleasant and engaged in conversation.  She has not been able to call resources provided due to concerns with her blood pressure.  Reports her physical health is her main focus at this time. Patients states emotionally, she is feeling better .  Prior to covid-19 she was doing Yoga which seems to help with symptoms of depression.  Intervention:Client interviewed and appropriate assessments performed. Discussed community resources, previous and current coping skills and benefits of relaxed breathing. Mailed client information about relaxed breathing and sleep hygiene. Other interventions include: Problem-solving teaching/coping strategies and Psychoeducation as well as emotional support.   Plan:  1. Patient would like to hold off on connecting with psychiatry at this time due to concerns with her physical health.   2. Patient will work on relaxed breathing and sleep hygiene 3. LCSW will F/U with patient in 2 weeks.  Kinnie [name], MD has been notified of this outreach and Maria Griffith.   Casimer Lanius, LCSW [facility]   [phone] 4:31 PM

## 2019-05-06 ENCOUNTER — Other Ambulatory Visit: Payer: Self-pay

## 2019-05-06 ENCOUNTER — Ambulatory Visit
Admission: RE | Admit: 2019-05-06 | Discharge: 2019-05-06 | Disposition: A | Discharge status: Home / Self Care | Payer: Medicare HMO | Source: Ambulatory Visit | Attending: Family Medicine | Admitting: Family Medicine

## 2019-05-06 DIAGNOSIS — R69 Illness, unspecified: Secondary | ICD-10-CM | POA: Diagnosis not present

## 2019-05-06 DIAGNOSIS — Z1231 Encounter for screening mammogram for malignant neoplasm of breast: Secondary | ICD-10-CM | POA: Diagnosis not present

## 2019-05-16 NOTE — Telephone Encounter (Signed)
   Phone Outreach Note  05/16/2019 Name: Maria Griffith MRN: [id number] DOB: 22-Jul-1961  Referred by: Kinnie [name], MD Reason for referral : Care Coordination (IBH F/U call) F/U to patient.  She is unable to talk and would like to schedule a phone appointment with LCSW.  Follow Up Plan: phone appointment scheduled 05/20/2019 at 3:30 with LCSW.  Casimer Lanius, [facility]   [phone] 12:26 PM

## 2019-05-19 ENCOUNTER — Other Ambulatory Visit: Payer: Self-pay | Admitting: Registered Nurse

## 2019-05-20 ENCOUNTER — Telehealth: Payer: Self-pay | Admitting: Licensed Clinical Social Worker

## 2019-05-20 ENCOUNTER — Encounter
Payer: No Typology Code available for payment source | Attending: Physical Medicine & Rehabilitation | Admitting: Physical Medicine & Rehabilitation

## 2019-05-20 ENCOUNTER — Encounter: Payer: Self-pay | Admitting: Physical Medicine & Rehabilitation

## 2019-05-20 ENCOUNTER — Ambulatory Visit: Payer: Medicare HMO

## 2019-05-20 ENCOUNTER — Other Ambulatory Visit: Payer: Self-pay

## 2019-05-20 VITALS — BP 126/78 | HR 86 | Temp 98.3°F | Ht 61.0 in | Wt 180.0 lb

## 2019-05-20 DIAGNOSIS — G243 Spasmodic torticollis: Secondary | ICD-10-CM | POA: Insufficient documentation

## 2019-05-20 MED ORDER — TRAZODONE HCL 150 MG PO TABS: 150.0000 mg | ORAL_TABLET | Freq: Every day | ORAL | 2 refills | Status: DC

## 2019-05-20 MED ORDER — HYDROCODONE-ACETAMINOPHEN 7.5-325 MG PO TABS: ORAL_TABLET | ORAL | 0 refills | Status: DC

## 2019-05-20 NOTE — Telephone Encounter (Signed)
Care Coordination  Telephone Outreach Note 05/20/2019 Name: Maria Griffith MRN: [id number] DOB: [birth year]  LCSW called Maria Griffith ref. Scheduled F/U phone appointment for today.  Patient states in pain and today is not a good day for her as she is leaving the [place] office. Plan:  1. Patient will call LCSW to reschedule phone appointment.   2. LCSW will wait for patient to call and reschedule phone appointment.  Casimer Lanius, [place]   [phone] 3:42 PM

## 2019-05-20 NOTE — Progress Notes (Signed)
   Procedure: Dysport injection Diagnosis: Cervical dystonia G 24.3 Dilution: 300 units in 1.5 mL sterile preservative-free normal saline  Informed consent was obtained after describing risks and benefits of the procedure with patient including bleeding bruising infection as well as the potential side effects of the medication itself. Patient elects to proceed and has given written consent.  Patient placed in a seated position Areas were marked and prepped with Betadine  Needle: 27-gauge 1 inch needle electrode connected to EMG amplifier    Left longissimus: 100 units Left splenius capitis: 150 units Left Levator scapula: 50  All injections done after negative drawback for blood. Appropriate EMG activity.   Nurse practitioner visit in 1 month to refill narcotic analgesic medication and check for compliance. PDMP reviewed appropriate

## 2019-05-27 ENCOUNTER — Telehealth: Payer: Self-pay

## 2019-05-27 NOTE — Telephone Encounter (Signed)
Virtual Visit Pre-Appointment Phone Call TELEPHONE CALL NOTE  Maria Griffith has been deemed a candidate for a follow-up tele-health visit to limit community exposure during the Covid-19 pandemic. I spoke with the patient via phone to ensure availability of phone/video source, confirm preferred email & phone number, and discuss instructions and expectations.  I reminded Maria Griffith to be prepared with any vital sign and/or heart rhythm information that could potentially be obtained via home monitoring, at the time of her visit. I reminded Maria Griffith to expect a phone call prior to her visit.  Patient agrees to consent below.  Cleon Gustin, RN 05/27/2019 12:32 PM  FULL LENGTH CONSENT FOR TELE-HEALTH VISIT   I hereby voluntarily request, consent and authorize [facility] and its employed or contracted physicians, physician assistants, nurse practitioners or other licensed health care professionals (the Practitioner), to provide me with telemedicine health care services (the "Services") as deemed necessary by the treating Practitioner. I acknowledge and consent to receive the Services by the Practitioner via telemedicine. I understand that the telemedicine visit will involve communicating with the Practitioner through live audiovisual communication technology and the disclosure of certain medical information by electronic transmission. I acknowledge that I have been given the opportunity to request an in-person assessment or other available alternative prior to the telemedicine visit and am voluntarily participating in the telemedicine visit.  I understand that I have the right to withhold or withdraw my consent to the use of telemedicine in the course of my care at any time, without affecting my right to future care or treatment, and that the Practitioner or I may terminate the telemedicine visit at any time. I understand that I have the right to inspect all information obtained  and/or recorded in the course of the telemedicine visit and may receive copies of available information for a reasonable fee.  I understand that some of the potential risks of receiving the Services via telemedicine include:  Marland Kitchen Delay or interruption in medical evaluation due to technological equipment failure or disruption; . Information transmitted may not be sufficient (e.g. poor resolution of images) to allow for appropriate medical decision making by the Practitioner; and/or  . In rare instances, security protocols could fail, causing a breach of personal health information.  Furthermore, I acknowledge that it is my responsibility to provide information about my medical history, conditions and care that is complete and accurate to the best of my ability. I acknowledge that Practitioner's advice, recommendations, and/or decision may be based on factors not within their control, such as incomplete or inaccurate data provided by me or distortions of diagnostic images or specimens that may result from electronic transmissions. I understand that the practice of medicine is not an exact science and that Practitioner makes no warranties or guarantees regarding treatment outcomes. I acknowledge that I will receive a copy of this consent concurrently upon execution via email to the email address I last provided but may also request a printed copy by calling the office of [place].    I understand that my insurance will be billed for this visit.   I have read or had this consent read to me. . I understand the contents of this consent, which adequately explains the benefits and risks of the Services being provided via telemedicine.  . I have been provided ample opportunity to ask questions regarding this consent and the Services and have had my questions answered to my satisfaction. . I give my  informed consent for the services to be provided through the use of telemedicine in my medical care  By  participating in this telemedicine visit I agree to the above.

## 2019-05-28 ENCOUNTER — Telehealth (INDEPENDENT_AMBULATORY_CARE_PROVIDER_SITE_OTHER): Payer: No Typology Code available for payment source | Admitting: Physician Assistant

## 2019-05-28 ENCOUNTER — Other Ambulatory Visit: Payer: Self-pay

## 2019-05-28 ENCOUNTER — Encounter: Payer: Self-pay | Admitting: Physician Assistant

## 2019-05-28 VITALS — BP 112/91 | HR 93 | Ht 61.0 in | Wt 175.0 lb

## 2019-05-28 DIAGNOSIS — I119 Hypertensive heart disease without heart failure: Secondary | ICD-10-CM | POA: Diagnosis not present

## 2019-05-28 DIAGNOSIS — I351 Nonrheumatic aortic (valve) insufficiency: Secondary | ICD-10-CM

## 2019-05-28 DIAGNOSIS — I77810 Thoracic aortic ectasia: Secondary | ICD-10-CM

## 2019-05-28 MED ORDER — LOSARTAN POTASSIUM 100 MG PO TABS: 100.0000 mg | ORAL_TABLET | Freq: Every day | ORAL | 3 refills | Status: DC

## 2019-05-28 MED ORDER — SPIRONOLACTONE 50 MG PO TABS: 25.0000 mg | ORAL_TABLET | Freq: Every day | ORAL | 3 refills | Status: DC
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
## 2019-05-28 NOTE — Patient Instructions (Addendum)
Medication Instructions:  Your physician has recommended you make the following change in your medication:   1. DECREASE: spironolactone (aldactone) 50 mg tablet: Take 1/2 tablet (25 mg total) once a day  2. TAKE: losartan (cozaar) 100 mg tablet: Take 1 tablet by mouth once a day  Lab work: None Ordered  If you have labs (blood work) drawn today and your tests are completely normal, you will receive your results only by: Marland Kitchen MyChart Message (if you have MyChart) OR . A paper copy in the mail If you have any lab test that is abnormal or we need to change your treatment, we will call you to review the results.  Testing/Procedures: None ordered  Follow-Up: . Follow up with Ermalinda Barrios, PA via VIDEO Visit on 06/11/19 at 2:00 PM  Any Other Special Instructions Will Be Listed Below (If Applicable).
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
## 2019-05-29 ENCOUNTER — Inpatient Hospital Stay: Payer: Medicare HMO | Attending: Hematology

## 2019-05-29 ENCOUNTER — Inpatient Hospital Stay (HOSPITAL_BASED_OUTPATIENT_CLINIC_OR_DEPARTMENT_OTHER): Payer: Medicare HMO | Admitting: Hematology

## 2019-05-29 ENCOUNTER — Other Ambulatory Visit: Payer: Self-pay

## 2019-05-29 VITALS — BP 104/68 | HR 89 | Temp 99.6°F | Resp 18 | Ht 61.0 in | Wt 183.6 lb

## 2019-05-29 DIAGNOSIS — R740 Nonspecific elevation of levels of transaminase and lactic acid dehydrogenase [LDH]: Secondary | ICD-10-CM | POA: Diagnosis not present

## 2019-05-29 DIAGNOSIS — Z79899 Other long term (current) drug therapy: Secondary | ICD-10-CM | POA: Insufficient documentation

## 2019-05-29 DIAGNOSIS — D709 Neutropenia, unspecified: Secondary | ICD-10-CM

## 2019-05-29 LAB — CMP (CANCER CENTER ONLY)
ALT: 22 U/L (ref 0–44)
AST: 21 U/L (ref 15–41)
Albumin: 4 g/dL (ref 3.5–5.0)
Alkaline Phosphatase: 69 U/L (ref 38–126)
Anion gap: 7 (ref 5–15)
BUN: 19 mg/dL (ref 6–20)
CO2: 25 mmol/L (ref 22–32)
Calcium: 8.9 mg/dL (ref 8.9–10.3)
Chloride: 105 mmol/L (ref 98–111)
Creatinine: 1.53 mg/dL — ABNORMAL HIGH (ref 0.44–1.00)
GFR, Est AFR Am: 44 mL/min — ABNORMAL LOW (ref >60)
GFR, Estimated: 38 mL/min — ABNORMAL LOW (ref >60)
Glucose, Bld: 84 mg/dL (ref 70–99)
Potassium: 4 mmol/L (ref 3.5–5.1)
Sodium: 137 mmol/L (ref 135–145)
Total Bilirubin: 0.4 mg/dL (ref 0.3–1.2)
Total Protein: 7.4 g/dL (ref 6.5–8.1)

## 2019-05-29 LAB — CBC WITH DIFFERENTIAL/PLATELET
Abs Immature Granulocytes: 0.01 10*3/uL (ref 0.00–0.07)
Basophils Absolute: 0 10*3/uL (ref 0.0–0.1)
Basophils Relative: 1 %
Eosinophils Absolute: 0.1 10*3/uL (ref 0.0–0.5)
Eosinophils Relative: 5 %
HCT: 35.8 % — ABNORMAL LOW (ref 36.0–46.0)
Hemoglobin: 11.8 g/dL — ABNORMAL LOW (ref 12.0–15.0)
Immature Granulocytes: 0 %
Lymphocytes Relative: 48 %
Lymphs Abs: 1.2 10*3/uL (ref 0.7–4.0)
MCH: 29.3 pg (ref 26.0–34.0)
MCHC: 33 g/dL (ref 30.0–36.0)
MCV: 88.8 fL (ref 80.0–100.0)
Monocytes Absolute: 0.2 10*3/uL (ref 0.1–1.0)
Monocytes Relative: 9 %
Neutro Abs: 0.9 10*3/uL — ABNORMAL LOW (ref 1.7–7.7)
Neutrophils Relative %: 37 %
Platelets: 265 10*3/uL (ref 150–400)
RBC: 4.03 MIL/uL (ref 3.87–5.11)
RDW: 13.9 % (ref 11.5–15.5)
WBC: 2.6 10*3/uL — ABNORMAL LOW (ref 4.0–10.5)
nRBC: 0 % (ref 0.0–0.2)

## 2019-05-30 ENCOUNTER — Telehealth: Payer: Self-pay | Admitting: Hematology

## 2019-05-30 NOTE — Telephone Encounter (Signed)
Scheduled appt per 6/11 los. A calendar will be mailed out. °

## 2019-06-04 ENCOUNTER — Telehealth: Payer: Self-pay

## 2019-06-04 NOTE — Telephone Encounter (Signed)
Patient called today requesting a refill/letter for heat wrap and tens units pads.  Noticed a letter written in January of 2020 requesting similar items:  Letter written to Mr. [name] regarding Ms. Renato Battles Heat Wraps:  Cervical and Lumbar wraps and Tens Unit Electrodes and wire. She has a Water engineer: [id number] Awaiting on response from Mr, [name].  Ms. Boudreau is aware of the above

## 2019-06-05 ENCOUNTER — Telehealth: Payer: Self-pay | Admitting: Physical Medicine & Rehabilitation

## 2019-06-05 NOTE — Telephone Encounter (Signed)
Return Ms. Renato Battles call,no answer. Left message to return the call.

## 2019-06-05 NOTE — Telephone Encounter (Signed)
Pt returned your call. Please call her back.  

## 2019-06-06 NOTE — Telephone Encounter (Signed)
Patient has returned call, requesting call back please.

## 2019-06-10 ENCOUNTER — Telehealth: Payer: Self-pay | Admitting: Registered Nurse

## 2019-06-10 NOTE — Telephone Encounter (Signed)
Letter printed for [name] to Mr. [name]. [employer].

## 2019-06-10 NOTE — Telephone Encounter (Signed)
Return Ms. Renato Battles call, will send letter and prescription for heat wraps and elctode wires  and electrode patches. She verbalizes understanding.

## 2019-06-11 ENCOUNTER — Encounter: Payer: Self-pay | Admitting: Physician Assistant

## 2019-06-11 ENCOUNTER — Telehealth (INDEPENDENT_AMBULATORY_CARE_PROVIDER_SITE_OTHER): Payer: Medicare HMO | Admitting: Physician Assistant

## 2019-06-11 ENCOUNTER — Other Ambulatory Visit: Payer: Self-pay

## 2019-06-11 VITALS — BP 171/109 | HR 82 | Ht 61.0 in | Wt 181.0 lb

## 2019-06-11 DIAGNOSIS — I77810 Thoracic aortic ectasia: Secondary | ICD-10-CM

## 2019-06-11 DIAGNOSIS — I351 Nonrheumatic aortic (valve) insufficiency: Secondary | ICD-10-CM

## 2019-06-11 DIAGNOSIS — I10 Essential (primary) hypertension: Secondary | ICD-10-CM

## 2019-06-11 MED ORDER — HYDRALAZINE HCL 50 MG PO TABS: 50.0000 mg | ORAL_TABLET | Freq: Three times a day (TID) | ORAL | 3 refills | Status: DC
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
## 2019-06-11 NOTE — Patient Instructions (Addendum)
Medication Instructions:  Your physician has recommended you make the following change in your medication:   INCREASE: hydralazine to 50 mg three times a day   Lab work: None Ordered  If you have labs (blood work) drawn today and your tests are completely normal, you will receive your results only by: Marland Kitchen MyChart Message (if you have MyChart) OR . A paper copy in the mail If you have any lab test that is abnormal or we need to change your treatment, we will call you to review the results.  Testing/Procedures: Your physician has requested that you have a renal artery duplex. During this test, an ultrasound is used to evaluate blood flow to the kidneys. Allow one hour for this exam. Do not eat after midnight the day before and avoid carbonated beverages. Take your medications as you usually do.   Follow-Up: . Follow up with Ermalinda Barrios, PA on 07/01/19 on 1:00 PM  Any Other Special Instructions Will Be Listed Below (If Applicable).

## 2019-06-13 ENCOUNTER — Telehealth: Payer: Self-pay | Admitting: Registered Nurse

## 2019-06-13 NOTE — Telephone Encounter (Signed)
Placed a call to [name], asking her to change her appointment to 2:00, no answer left message to return the call.

## 2019-06-17 ENCOUNTER — Encounter: Payer: No Typology Code available for payment source | Attending: Registered Nurse | Admitting: Registered Nurse

## 2019-06-17 ENCOUNTER — Other Ambulatory Visit: Payer: Self-pay

## 2019-06-17 ENCOUNTER — Encounter: Payer: Self-pay | Admitting: Registered Nurse

## 2019-06-17 VITALS — BP 122/76 | HR 86 | Temp 97.7°F | Ht 61.0 in | Wt 184.0 lb

## 2019-06-17 DIAGNOSIS — Z79891 Long term (current) use of opiate analgesic: Secondary | ICD-10-CM | POA: Insufficient documentation

## 2019-06-17 DIAGNOSIS — Z5181 Encounter for therapeutic drug level monitoring: Secondary | ICD-10-CM | POA: Diagnosis present

## 2019-06-17 MED ORDER — HYDROCODONE-ACETAMINOPHEN 7.5-325 MG PO TABS: ORAL_TABLET | ORAL | 0 refills | Status: DC
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
## 2019-06-19 LAB — TOXASSURE SELECT,+ANTIDEPR,UR

## 2019-06-24 ENCOUNTER — Telehealth: Payer: Self-pay | Admitting: *Deleted

## 2019-06-24 NOTE — Telephone Encounter (Signed)
Urine drug screen for this encounter is consistent for prescribed medication. She is taking trazodone.

## 2019-06-26 ENCOUNTER — Telehealth: Payer: Self-pay | Admitting: Physician Assistant

## 2019-06-26 NOTE — Telephone Encounter (Signed)
New Message           Patient would like a call back to verify what she need to do for her test at [facility], pls call and advise.

## 2019-06-26 NOTE — Telephone Encounter (Signed)
Left message for patient to call back  

## 2019-06-27 ENCOUNTER — Ambulatory Visit (HOSPITAL_COMMUNITY): Payer: Medicare HMO

## 2019-06-30 ENCOUNTER — Telehealth: Payer: Self-pay | Admitting: Physician Assistant

## 2019-06-30 NOTE — Telephone Encounter (Signed)
Answered patient's questions re: Renal US on 7/20. Patient has an appointment with Ermalinda Barrios, PA in the office tomorrow. Patient answers no to all screening questions below. Patient understands that there are no visitors allowed. Patient understands not to arrive more than 15 minutes prior to the scheduled appointment time and that a mask will be required for the visit.      COVID-19 Pre-Screening Questions:  . In the past 7 to 10 days have you had a cough,  shortness of breath, headache, congestion, fever (100 or greater) body aches, chills, sore throat, or sudden loss of taste or sense of smell? NO . Have you been around anyone with known Covid 19? NO . Have you been around anyone who is awaiting Covid 19 test results in the past 7 to 10 days? NO . Have you been around anyone who has been exposed to Covid 19, or has mentioned symptoms of Covid 19 within the past 7 to 10 days? NO

## 2019-06-30 NOTE — Telephone Encounter (Signed)
New Message  ° ° ° °Left Message to confirm appt and answer covid questions  °

## 2019-07-01 ENCOUNTER — Ambulatory Visit (INDEPENDENT_AMBULATORY_CARE_PROVIDER_SITE_OTHER): Payer: Medicare HMO | Admitting: Physician Assistant

## 2019-07-01 ENCOUNTER — Encounter: Payer: Self-pay | Admitting: Physician Assistant

## 2019-07-01 ENCOUNTER — Other Ambulatory Visit: Payer: Self-pay

## 2019-07-01 VITALS — BP 138/86 | HR 105 | Ht 61.0 in | Wt 183.1 lb

## 2019-07-01 DIAGNOSIS — I77819 Aortic ectasia, unspecified site: Secondary | ICD-10-CM | POA: Diagnosis not present

## 2019-07-01 DIAGNOSIS — I351 Nonrheumatic aortic (valve) insufficiency: Secondary | ICD-10-CM | POA: Diagnosis not present

## 2019-07-01 DIAGNOSIS — I10 Essential (primary) hypertension: Secondary | ICD-10-CM
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
## 2019-07-01 NOTE — Patient Instructions (Signed)
Medication Instructions:   Your physician recommends that you continue on your current medications as directed. Please refer to the Current Medication list given to you today.  If you need a refill on your cardiac medications before your next appointment, please call your pharmacy.   Lab work:  None ordered today  If you have labs (blood work) drawn today and your tests are completely normal, you will receive your results only by: [facility] MyChart Message (if you have MyChart) OR . A paper copy in the mail If you have any lab test that is abnormal or we need to change your treatment, we will call you to review the results.  Testing/Procedures:  None ordered today  Follow-Up:  On 10/08/19 at 9AM with Lauree Chandler, MD  Any Other Special Instructions Will Be Listed Below (If Applicable).  Decrease your caffeine intake

## 2019-07-08 ENCOUNTER — Ambulatory Visit (HOSPITAL_COMMUNITY)
Admission: RE | Admit: 2019-07-08 | Discharge: 2019-07-08 | Disposition: A | Discharge status: Home / Self Care | Payer: Medicare HMO | Source: Ambulatory Visit | Attending: Cardiovascular Disease | Admitting: Cardiovascular Disease

## 2019-07-08 ENCOUNTER — Other Ambulatory Visit: Payer: Self-pay

## 2019-07-08 DIAGNOSIS — I10 Essential (primary) hypertension: Secondary | ICD-10-CM | POA: Diagnosis not present

## 2019-07-16 ENCOUNTER — Other Ambulatory Visit: Payer: Self-pay

## 2019-07-16 ENCOUNTER — Encounter: Payer: Self-pay | Admitting: Registered Nurse

## 2019-07-16 ENCOUNTER — Encounter
Payer: No Typology Code available for payment source | Attending: Physical Medicine & Rehabilitation | Admitting: Registered Nurse

## 2019-07-16 VITALS — BP 108/76 | HR 92 | Temp 98.7°F | Resp 12 | Ht 61.0 in | Wt 180.0 lb

## 2019-07-16 DIAGNOSIS — D649 Anemia, unspecified: Secondary | ICD-10-CM | POA: Insufficient documentation

## 2019-07-16 DIAGNOSIS — J45909 Unspecified asthma, uncomplicated: Secondary | ICD-10-CM | POA: Insufficient documentation

## 2019-07-16 DIAGNOSIS — G249 Dystonia, unspecified: Secondary | ICD-10-CM | POA: Diagnosis not present

## 2019-07-16 DIAGNOSIS — K5900 Constipation, unspecified: Secondary | ICD-10-CM | POA: Insufficient documentation

## 2019-07-16 DIAGNOSIS — R110 Nausea: Secondary | ICD-10-CM | POA: Diagnosis not present

## 2019-07-16 DIAGNOSIS — M542 Cervicalgia: Secondary | ICD-10-CM | POA: Diagnosis not present

## 2019-07-16 DIAGNOSIS — R262 Difficulty in walking, not elsewhere classified: Secondary | ICD-10-CM | POA: Insufficient documentation

## 2019-07-16 DIAGNOSIS — M961 Postlaminectomy syndrome, not elsewhere classified: Secondary | ICD-10-CM

## 2019-07-16 DIAGNOSIS — F419 Anxiety disorder, unspecified: Secondary | ICD-10-CM | POA: Insufficient documentation

## 2019-07-16 DIAGNOSIS — G4700 Insomnia, unspecified: Secondary | ICD-10-CM | POA: Diagnosis not present

## 2019-07-16 DIAGNOSIS — Z79891 Long term (current) use of opiate analgesic: Secondary | ICD-10-CM | POA: Diagnosis not present

## 2019-07-16 DIAGNOSIS — F5101 Primary insomnia: Secondary | ICD-10-CM

## 2019-07-16 DIAGNOSIS — M62838 Other muscle spasm: Secondary | ICD-10-CM | POA: Diagnosis not present

## 2019-07-16 DIAGNOSIS — F329 Major depressive disorder, single episode, unspecified: Secondary | ICD-10-CM | POA: Diagnosis not present

## 2019-07-16 DIAGNOSIS — Z5181 Encounter for therapeutic drug level monitoring: Secondary | ICD-10-CM | POA: Diagnosis not present

## 2019-07-16 DIAGNOSIS — G5602 Carpal tunnel syndrome, left upper limb: Secondary | ICD-10-CM | POA: Diagnosis not present

## 2019-07-16 DIAGNOSIS — M7918 Myalgia, other site: Secondary | ICD-10-CM

## 2019-07-16 DIAGNOSIS — G894 Chronic pain syndrome: Secondary | ICD-10-CM

## 2019-07-16 DIAGNOSIS — Z79899 Other long term (current) drug therapy: Secondary | ICD-10-CM | POA: Insufficient documentation

## 2019-07-16 DIAGNOSIS — Z760 Encounter for issue of repeat prescription: Secondary | ICD-10-CM | POA: Diagnosis not present

## 2019-07-16 DIAGNOSIS — M4722 Other spondylosis with radiculopathy, cervical region: Secondary | ICD-10-CM | POA: Diagnosis not present

## 2019-07-16 DIAGNOSIS — M5412 Radiculopathy, cervical region: Secondary | ICD-10-CM

## 2019-07-16 MED ORDER — SENNOSIDES-DOCUSATE SODIUM 8.6-50 MG PO TABS: 1.0000 | ORAL_TABLET | Freq: Every day | ORAL | 5 refills | Status: DC

## 2019-07-16 MED ORDER — TIZANIDINE HCL 4 MG PO TABS: ORAL_TABLET | ORAL | 2 refills | Status: DC

## 2019-07-16 MED ORDER — HYDROCODONE-ACETAMINOPHEN 7.5-325 MG PO TABS: ORAL_TABLET | ORAL | 0 refills | Status: DC

## 2019-07-16 MED ORDER — TRAZODONE HCL 150 MG PO TABS: 150.0000 mg | ORAL_TABLET | Freq: Every day | ORAL | 2 refills | Status: DC
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
## 2019-07-24 DIAGNOSIS — I10 Essential (primary) hypertension: Secondary | ICD-10-CM | POA: Diagnosis not present

## 2019-07-24 DIAGNOSIS — H5200 Hypermetropia, unspecified eye: Secondary | ICD-10-CM | POA: Diagnosis not present

## 2019-07-29 ENCOUNTER — Telehealth: Payer: Self-pay

## 2019-07-29 NOTE — Telephone Encounter (Signed)
Patient called stating she needs a doctor note to go to the [facility] because its appts only. Also she would like her script for the TENS unit to be faxed to her attorney at [phone] Attn: Ms. Judson Roch the attorney can work on getting it for her since she has not gotten it yet.

## 2019-07-31 NOTE — Telephone Encounter (Signed)
Return Ms. Renato Battles call for clarification of her request. Will speak with Zorita Pang Manager  regarding Ms. Renato Battles request for the [employer] Ms. Borman is aware of the above and verbalizes understanding. This provider will place a call to the [facility] to inquire regarding the letter request, she verbalizes understanding.

## 2019-08-15 ENCOUNTER — Telehealth: Payer: Self-pay | Admitting: Registered Nurse

## 2019-08-15 ENCOUNTER — Encounter: Payer: No Typology Code available for payment source | Admitting: Registered Nurse

## 2019-08-15 MED ORDER — HYDROCODONE-ACETAMINOPHEN 7.5-325 MG PO TABS: ORAL_TABLET | ORAL | 0 refills | Status: DC

## 2019-08-15 NOTE — Telephone Encounter (Signed)
PMP was reviewed, last refill was on 07/17/2019. She had transportation issues and her appointment was rescheduled for Monday 08/18/2019.

## 2019-08-15 NOTE — Telephone Encounter (Signed)
Patient had to reschedule today's visit due to transportation. She rescheduled for Monday. She said she needed refills today.

## 2019-08-18 ENCOUNTER — Encounter
Payer: No Typology Code available for payment source | Attending: Physical Medicine & Rehabilitation | Admitting: Registered Nurse

## 2019-08-18 ENCOUNTER — Other Ambulatory Visit: Payer: Self-pay | Admitting: Physical Medicine & Rehabilitation

## 2019-08-18 ENCOUNTER — Other Ambulatory Visit: Payer: Self-pay

## 2019-08-18 VITALS — BP 110/70 | HR 79 | Temp 98.2°F | Ht 61.0 in | Wt 179.4 lb

## 2019-08-18 DIAGNOSIS — G894 Chronic pain syndrome: Secondary | ICD-10-CM

## 2019-08-18 DIAGNOSIS — G5602 Carpal tunnel syndrome, left upper limb: Secondary | ICD-10-CM | POA: Insufficient documentation

## 2019-08-18 DIAGNOSIS — G4700 Insomnia, unspecified: Secondary | ICD-10-CM | POA: Diagnosis not present

## 2019-08-18 DIAGNOSIS — R110 Nausea: Secondary | ICD-10-CM | POA: Insufficient documentation

## 2019-08-18 DIAGNOSIS — Z5181 Encounter for therapeutic drug level monitoring: Secondary | ICD-10-CM

## 2019-08-18 DIAGNOSIS — M961 Postlaminectomy syndrome, not elsewhere classified: Secondary | ICD-10-CM | POA: Diagnosis not present

## 2019-08-18 DIAGNOSIS — Z79891 Long term (current) use of opiate analgesic: Secondary | ICD-10-CM | POA: Diagnosis not present

## 2019-08-18 DIAGNOSIS — K5900 Constipation, unspecified: Secondary | ICD-10-CM | POA: Diagnosis not present

## 2019-08-18 DIAGNOSIS — M4722 Other spondylosis with radiculopathy, cervical region: Secondary | ICD-10-CM | POA: Diagnosis not present

## 2019-08-18 DIAGNOSIS — Z79899 Other long term (current) drug therapy: Secondary | ICD-10-CM | POA: Insufficient documentation

## 2019-08-18 DIAGNOSIS — R262 Difficulty in walking, not elsewhere classified: Secondary | ICD-10-CM | POA: Insufficient documentation

## 2019-08-18 DIAGNOSIS — F329 Major depressive disorder, single episode, unspecified: Secondary | ICD-10-CM | POA: Insufficient documentation

## 2019-08-18 DIAGNOSIS — M542 Cervicalgia: Secondary | ICD-10-CM

## 2019-08-18 DIAGNOSIS — G249 Dystonia, unspecified: Secondary | ICD-10-CM | POA: Diagnosis not present

## 2019-08-18 DIAGNOSIS — M5412 Radiculopathy, cervical region: Secondary | ICD-10-CM

## 2019-08-18 DIAGNOSIS — M62838 Other muscle spasm: Secondary | ICD-10-CM | POA: Insufficient documentation

## 2019-08-18 DIAGNOSIS — Z760 Encounter for issue of repeat prescription: Secondary | ICD-10-CM | POA: Insufficient documentation

## 2019-08-18 DIAGNOSIS — J45909 Unspecified asthma, uncomplicated: Secondary | ICD-10-CM | POA: Diagnosis not present

## 2019-08-18 DIAGNOSIS — D649 Anemia, unspecified: Secondary | ICD-10-CM | POA: Insufficient documentation

## 2019-08-18 DIAGNOSIS — F5101 Primary insomnia: Secondary | ICD-10-CM | POA: Diagnosis not present

## 2019-08-18 DIAGNOSIS — M7918 Myalgia, other site: Secondary | ICD-10-CM

## 2019-08-18 DIAGNOSIS — F419 Anxiety disorder, unspecified: Secondary | ICD-10-CM | POA: Diagnosis not present
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
## 2019-08-19 ENCOUNTER — Other Ambulatory Visit: Payer: Self-pay | Admitting: Physical Medicine & Rehabilitation

## 2019-08-25 ENCOUNTER — Encounter: Payer: Self-pay | Admitting: Registered Nurse

## 2019-09-11 ENCOUNTER — Telehealth: Payer: Self-pay

## 2019-09-11 ENCOUNTER — Encounter: Payer: No Typology Code available for payment source | Admitting: Registered Nurse

## 2019-09-11 NOTE — Telephone Encounter (Signed)
Ptn called in severe pain - has 8 pills left cannot come in also coughly heavily please call and advise if she can wait 30 days or must come back within 7 -- no new appt made

## 2019-09-12 NOTE — Telephone Encounter (Signed)
Return [name] call, she states she has a cough and swelling in her lower extremities. Also instructed to follow up PCP and Cardiologist. Her appointment was changed to 09/15/2019 for 2:00 appointment, she verbalizes understanding.

## 2019-09-15 ENCOUNTER — Encounter: Payer: No Typology Code available for payment source | Attending: Registered Nurse | Admitting: Registered Nurse

## 2019-09-15 ENCOUNTER — Other Ambulatory Visit: Payer: Self-pay

## 2019-09-15 ENCOUNTER — Encounter: Payer: Self-pay | Admitting: Registered Nurse

## 2019-09-15 VITALS — BP 136/65 | HR 82 | Ht 61.0 in | Wt 178.0 lb

## 2019-09-15 DIAGNOSIS — Z79891 Long term (current) use of opiate analgesic: Secondary | ICD-10-CM | POA: Diagnosis present

## 2019-09-15 DIAGNOSIS — Z5181 Encounter for therapeutic drug level monitoring: Secondary | ICD-10-CM | POA: Diagnosis present

## 2019-09-15 DIAGNOSIS — M542 Cervicalgia: Secondary | ICD-10-CM

## 2019-09-15 DIAGNOSIS — G894 Chronic pain syndrome: Secondary | ICD-10-CM | POA: Diagnosis present

## 2019-09-15 DIAGNOSIS — M5412 Radiculopathy, cervical region: Secondary | ICD-10-CM | POA: Diagnosis not present

## 2019-09-15 DIAGNOSIS — M4722 Other spondylosis with radiculopathy, cervical region: Secondary | ICD-10-CM

## 2019-09-15 DIAGNOSIS — M5416 Radiculopathy, lumbar region: Secondary | ICD-10-CM | POA: Diagnosis present

## 2019-09-15 DIAGNOSIS — M7918 Myalgia, other site: Secondary | ICD-10-CM

## 2019-09-15 DIAGNOSIS — F5101 Primary insomnia: Secondary | ICD-10-CM

## 2019-09-15 DIAGNOSIS — M961 Postlaminectomy syndrome, not elsewhere classified: Secondary | ICD-10-CM | POA: Diagnosis present

## 2019-09-15 MED ORDER — TIZANIDINE HCL 4 MG PO TABS: ORAL_TABLET | ORAL | 3 refills | Status: DC

## 2019-09-15 MED ORDER — HYDROCODONE-ACETAMINOPHEN 7.5-325 MG PO TABS: ORAL_TABLET | ORAL | 0 refills | Status: DC

## 2019-09-15 MED ORDER — TRAZODONE HCL 150 MG PO TABS: 150.0000 mg | ORAL_TABLET | Freq: Every day | ORAL | 3 refills | Status: DC
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
## 2019-09-15 NOTE — Patient Instructions (Addendum)
I Danella Sensing sent a email to Mr. [name] on 09/15/2019: 2:30 PM  Mr. [name],  I have  Seen Ms. Angeleena Duffus DOB 08/23/2061, on 09/15/2019. I need clarification on what Workers Compensation is asking from me in regards to her Cervical and Lumbar Heat Wraps and Tens Unit supply. I put on the prescription refills and to dispense a case of the heat wraps she is only receiving three individual wraps, she needs 30  heat wraps for Cervical and 30 heat wraps for Lumbar for a month. . She is not receiving the adequate supply, please help in this matter.  In regarding to the Tens Unit supply she hasn't receive any supplies, I have already faxed this information to your office can you please explain the delay. The above is affecting my patient's quality of life, I need your assistance in this matter.  Danella Sensing ANP-C

## 2019-09-30 ENCOUNTER — Other Ambulatory Visit: Payer: Self-pay | Admitting: Family Medicine

## 2019-10-01 ENCOUNTER — Telehealth: Payer: Self-pay | Admitting: Registered Nurse

## 2019-10-01 NOTE — Telephone Encounter (Signed)
Received a call from [place] from : [facility]. Regarding Ms. Dromgoole Cervical/ Lumbar Wraps and TENS Unit electrodes.  [employer] Telephone Number: [phone] Email: [email or web address].  Placed a call to Ms. Renato Battles regarding the above no answer, left message to return the call.

## 2019-10-02 ENCOUNTER — Telehealth: Payer: Self-pay | Admitting: Registered Nurse

## 2019-10-02 NOTE — Telephone Encounter (Signed)
Spoke with [name] regarding the call from [place], she verbalizes understanding.

## 2019-10-07 ENCOUNTER — Encounter: Payer: No Typology Code available for payment source | Admitting: Physical Medicine & Rehabilitation

## 2019-10-08 ENCOUNTER — Ambulatory Visit: Payer: Medicare HMO | Admitting: Cardiovascular Disease

## 2019-10-17 ENCOUNTER — Ambulatory Visit: Payer: No Typology Code available for payment source | Admitting: Physical Medicine & Rehabilitation

## 2019-10-21 ENCOUNTER — Telehealth: Payer: Self-pay

## 2019-10-21 ENCOUNTER — Encounter: Payer: No Typology Code available for payment source | Admitting: Physical Medicine & Rehabilitation

## 2019-10-21 MED ORDER — HYDROCODONE-ACETAMINOPHEN 7.5-325 MG PO TABS: ORAL_TABLET | ORAL | 0 refills | Status: DC

## 2019-10-21 NOTE — Telephone Encounter (Signed)
Maria Griffith -- AK has asked that you refill her RX as she cannot have procedure today

## 2019-10-21 NOTE — Telephone Encounter (Signed)
PMP was reviewed. Last Hydrocodone was filled on 09/15/2019. Hydrocodone e- scribed today. Placed a call to Ms. Renato Battles regarding the above, voicemail full. Office staff let Ms. Kois know earlier we would be sending a prescription in.

## 2019-10-28 ENCOUNTER — Other Ambulatory Visit: Payer: Self-pay | Admitting: Family Medicine

## 2019-10-28 DIAGNOSIS — F329 Major depressive disorder, single episode, unspecified: Secondary | ICD-10-CM

## 2019-10-28 DIAGNOSIS — J45909 Unspecified asthma, uncomplicated: Secondary | ICD-10-CM

## 2019-10-28 DIAGNOSIS — F4310 Post-traumatic stress disorder, unspecified: Secondary | ICD-10-CM

## 2019-10-28 DIAGNOSIS — I10 Essential (primary) hypertension: Secondary | ICD-10-CM

## 2019-10-31 ENCOUNTER — Telehealth: Payer: Self-pay | Admitting: Family Medicine

## 2019-10-31 NOTE — Chronic Care Management (AMB) (Signed)
  Care Management   Note  10/31/2019 Name: DIJONNA GERGES MRN: [id number] DOB: 02/26/1961  Corey Harold Wigand is a 58 y.o. year old female who is a primary care patient of Eniola, Phill Myron, MD. I reached out to Baruch Gouty by phone today in response to a referral sent by Ms. Kabao M Montag's health plan.    Ms. Szuch was given information about care management services today including:  1. Care management services include personalized support from designated clinical staff supervised by her physician, including individualized plan of care and coordination with other care providers 2. 24/7 contact phone numbers for assistance for urgent and routine care needs. 3. The patient may stop care management services at any time by phone call to the office staff.  Patient agreed to services and verbal consent obtained.   Follow up plan: Telephone appointment with CCM team member scheduled for:11/04/2019  Glenna Durand LPN Nurse Health Advisor . [place]  ??[email or web address] ??[phone]

## 2019-11-04 ENCOUNTER — Ambulatory Visit: Payer: Medicare HMO

## 2019-11-04 ENCOUNTER — Other Ambulatory Visit: Payer: Self-pay

## 2019-11-04 NOTE — Chronic Care Management (AMB) (Signed)
  Care Management   Outreach Note  11/04/2019 Name: Maria Griffith MRN: [id number] DOB: 1961/11/29  Referred by: Kinnie [name], MD Reason for referral : Care Coordination (New Referral HTN/Asthma)   An unsuccessful telephone outreach was attempted today. The patient was referred to the case management team by for assistance with care management and care coordination.   Follow Up Plan: The care management team will reach out to the patient again over the next 7 days.   Lazaro Arms RN, BSN, [facility] Care Management Coordinator [place] Phone: [phone] I Office: [phone] Fax: [phone]

## 2019-11-05 ENCOUNTER — Other Ambulatory Visit: Payer: Self-pay | Admitting: Registered Nurse

## 2019-11-08 ENCOUNTER — Other Ambulatory Visit: Payer: Self-pay | Admitting: Family Medicine

## 2019-11-11 ENCOUNTER — Ambulatory Visit: Payer: Self-pay

## 2019-11-11 ENCOUNTER — Other Ambulatory Visit: Payer: Self-pay

## 2019-11-11 ENCOUNTER — Ambulatory Visit: Payer: Medicare HMO

## 2019-11-11 NOTE — Chronic Care Management (AMB) (Signed)
  Care Management   Outreach Note  11/11/2019 Name: Maria Griffith MRN: [id number] DOB: April 12, 1961  Referred by: Kinnie [name], MD Reason for referral : Care Coordination (Care Management and Education)   Another unsuccessful  telephone outreach was attempted today. The patient was referred to the case management team for assistance with care management and care coordination.  Patient called to the office.  I was sent a message notifying me she called.  I returned the called no answer and unable to leave a message due to the mailbox box full.  Follow Up Plan: The care management team will reach out to the patient again over the next 7 days.   Lazaro Arms RN, BSN, [facility] Care Management Coordinator [place] Phone: [phone] I Office: [phone] Fax: [phone]

## 2019-11-11 NOTE — Chronic Care Management (AMB) (Signed)
  Care Management   Outreach Note  11/11/2019 Name: MEUY BINETTI MRN: [id number] DOB: Oct 24, 1961  Referred by: Kinnie [name], MD Reason for referral : Care Coordination (Care Management and Education)   A second unsuccessful telephone outreach was attempted today. The patient was referred to the case management team for assistance with care management and care coordination.   Follow Up Plan: The care management team will reach out to the patient again over the next 7 days.  The patient has been provided with contact information for the care management team and has been advised to call with any health related questions or concerns.   Lazaro Arms RN, BSN, [facility] [facility] Care Management Coordinator [place] Phone: [phone] I Office: [phone] Fax: [phone]

## 2019-11-12 ENCOUNTER — Telehealth: Payer: Self-pay

## 2019-11-12 NOTE — Telephone Encounter (Signed)
Ptn called and left voicemail that her attorney said they spoke with work comp [employer] - I told her no documents on file anyone spoke to work comp or recd fax from work comp.  I told her we can treat her but as far as we know she is a self pay because they will not acknowledge  Coverage of [place].

## 2019-11-18 ENCOUNTER — Other Ambulatory Visit: Payer: Self-pay | Admitting: Family Medicine

## 2019-11-18 ENCOUNTER — Ambulatory Visit: Payer: Medicare HMO

## 2019-11-18 ENCOUNTER — Ambulatory Visit: Payer: Self-pay

## 2019-11-18 MED ORDER — OXYBUTYNIN CHLORIDE 5 MG PO TABS: 5.0000 mg | ORAL_TABLET | Freq: Two times a day (BID) | ORAL | 1 refills | Status: DC

## 2019-11-18 NOTE — Chronic Care Management (AMB) (Signed)
  Care Management   Outreach Note  11/18/2019 Name: Maria Griffith MRN: [id number] DOB: July 02, 1961  Referred by: Kinnie [name], MD Reason for referral : Care Coordination (Care Management 3rd attempt)   Third unsuccessful telephone outreach was attempted today. The patient was referred to the case management team for assistance with care management and care coordination. The patient's primary care provider has been notified of our unsuccessful attempts to make or maintain contact with the patient. The care management team is pleased to engage with this patient at any time in the future should he/she be interested in assistance from the care management team.   Follow Up Plan: The care management team is available to follow up with the patient after provider conversation with the patient regarding recommendation for care management engagement and subsequent re-referral to the care management team.   Lazaro Arms RN, BSN, [place] Phone: [phone] I Office: [phone] Fax: [phone]

## 2019-11-19 ENCOUNTER — Telehealth: Payer: Self-pay

## 2019-11-19 ENCOUNTER — Other Ambulatory Visit: Payer: Self-pay | Admitting: Physical Medicine & Rehabilitation

## 2019-11-19 NOTE — Telephone Encounter (Signed)
Pt hasn't been physically seen since September. Needs OV for more medication.

## 2019-11-19 NOTE — Telephone Encounter (Signed)
Patient called requesting refill on pain medication. Last filled Hydrocodone on 10/21/2019, no future appts scheduled. 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

## 2019-11-20 NOTE — Telephone Encounter (Signed)
I spoke with Maria Griffith and informed her we cannot fill medication without a visit.

## 2019-11-20 NOTE — Patient Instructions (Signed)
Visit Information  Goals Addressed            This Visit's Progress   . " I have been having irritation in my pubic area" (pt-stated)                        Current Barriers:  Marland Kitchen Knowledge Deficits related to self management of irritation in pubic area  Nurse Case Manager Clinical Goal(s):  Marland Kitchen Over the next 5 days, patient will attend all scheduled medical appointments:  Interventions: >  Continue medication oxybutynin 5mg  >   Keep are dry >   Avoid any scent soaps or lotions >    Keep appointment schedule 11-25-19. >    Collaborated with the PCP  Patient Self Care Activities:  . Patient verbalizes understanding of plan  . Self administers medications as prescribed . Calls provider office for new concerns or questions . Unable to independently Self manage irritation of pubic area  Initial goal documentation     . "My blood pressures are doing ok" (pt-stated)       Current Barriers:  . Chronic Disease Management support, education, and care coordination needs related to HTN  Clinical Goal(s) related to HTN:  Over the next 90 days, patient will:  . Work with the care management team to address educational, disease management, and care coordination needs  . Begin or continue self health monitoring activities as directed today Call provider office for new or worsened signs and symptoms  . Call care management team with questions or concerns . Verbalize basic understanding of patient centered plan of care established today  Interventions related to HTN:  . Evaluation of current treatment plans and patient's adherence to plan as established by provider . Assessed patient understanding of disease states . Assessed patient's education and care coordination needs . Provided disease specific education to patient  . Collaborated with appropriate clinical care team members regarding patient needs  Patient Self Care Activities related to HTN:  . Patient is unable to independently  self-manage chronic health conditions  Initial goal documentation     . "When I cough I have thick phlegm to fly out of my mouth" (pt-stated)       Current Barriers:  . Chronic Disease Management support, education, and care coordination needs related to  Asthma  Clinical Goal(s) related to  Asthma :  Over the next 90 days, patient will:  . Work with the care management team to address educational, disease management, and care coordination needs  . Begin or continue self health monitoring activities as directed today  . Call provider office for new or worsened signs and symptoms  . Call care management team with questions or concerns . Verbalize basic understanding of patient centered plan of care established today  Interventions related to  Asthma :  . Evaluation of current treatment plans and patient's adherence to plan as established by provider . Assessed patient understanding of disease states . Assessed patient's education and care coordination needs . Provided disease specific education to patient  . Keep humidifier in the home . Take medications as prescribed . Drink hot liquids i.e...like tea . Collaborated with appropriate clinical care team members regarding patient needs  Patient Self Care Activities related to  Astma :  . Patient is unable to independently self-manage chronic health conditions  Initial goal documentation        Ms. Carringer was given information about Care Management services today including:  1.  Care Management services include personalized support from designated clinical staff supervised by her physician, including individualized plan of care and coordination with other care providers 2. 24/7 contact phone numbers for assistance for urgent and routine care needs. 3. The patient may stop CCM services at any time (effective at the end of the month) by phone call to the office staff.  Patient agreed to services and verbal consent obtained.   The  patient verbalized understanding of instructions provided today and declined a print copy of patient instruction materials.   The care management team will reach out to the patient again over the next 14 days.  The patient has been provided with contact information for the care management team and has been advised to call with any health related questions or concerns.   Lazaro Arms RN, BSN, [facility] Care Management Coordinator [place] Phone: [phone] I Office: [phone] Fax: [phone]

## 2019-11-20 NOTE — Chronic Care Management (AMB) (Signed)
Care Management   Initial Visit Note  11/20/2019 Name: Maria Griffith MRN: [id number] DOB: Mar 16, 1961  Subjective:   Objective:  Assessment: Maria Griffith is a 58 y.o. year old female who sees Kinnie [name], MD for primary care. The care management team was consulted for assistance with care management and care coordination needs related to Disease Management Educational Needs for HTN and Asthma.   Review of patient status, including review of consultants reports, relevant laboratory and other test results, and collaboration with appropriate care team members and the patient's provider was performed as part of comprehensive patient evaluation and provision of care management services.    SDOH (Social Determinants of Health) screening performed today: None. See Care Plan for related entries.    Outpatient Encounter Medications as of 11/18/2019  Medication Sig  . albuterol (PROVENTIL) (2.5 MG/3ML) 0.083% nebulizer solution INHALE THE CONTENTS OF 1 VIAL VIA NEBULIZER EVERY 6 HOURS AS NEEDED FOR WHEEZING OR SHORTNESS OF BREATH  . albuterol (VENTOLIN HFA) 108 (90 Base) MCG/ACT inhaler INHALE 2 PUFFS into THE lungs EVERY 6 HOURS AS NEEDED FOR WHEEZING  . benzonatate (TESSALON) 100 MG capsule TAKE 1 CAPSULE BY MOUTH 3 TIMES DAILY AS NEEDED FOR COUGH  . diclofenac sodium (VOLTAREN) 1 % GEL APPLY 2 grams topically 4 TIMES DAILY  . Fluticasone-Salmeterol (ADVAIR DISKUS) 250-50 MCG/DOSE AEPB Inhale 1 puff into the lungs 2 (two) times daily.  . hydrALAZINE (APRESOLINE) 50 MG tablet Take 1 tablet (50 mg total) by mouth 3 (three) times daily.  Marland Kitchen HYDROcodone-acetaminophen (NORCO) 7.5-325 MG tablet TAKE 1 TABLET BY MOUTH EVERY 6 HOURS AS NEEDED FOR MODERATE PAIN  . loratadine (CLARITIN) 10 MG tablet TAKE 1 TABLET BY MOUTH EVERY DAY AS NEEDED FOR allergies  . losartan (COZAAR) 100 MG tablet Take 1 tablet (100 mg total) by mouth daily.  . polyethylene glycol powder (GAVILAX) powder Mix 17 grams IN  EIGHT ounces OF liquid AND drink EVERY DAY AS DIRECTED  . pregabalin (LYRICA) 100 MG capsule TAKE 1 CAPSULE BY MOUTH 3 TIMES DAILY  . senna-docusate (GNP STOOL SOFTENER/LAXATIVE) 8.6-50 MG tablet Take 1 tablet by mouth at bedtime.  Marland Kitchen tiZANidine (ZANAFLEX) 4 MG tablet TAKE 1 TABLET BY MOUTH 3 TIMES DAILY  . traZODone (DESYREL) 150 MG tablet TAKE 1 TABLET BY MOUTH AT BEDTIME  . Vitamin D, Ergocalciferol, (DRISDOL) 1.25 MG (50000 UT) CAPS capsule Take 1 capsule (50,000 Units total) by mouth every 7 (seven) days.  . [DISCONTINUED] oxybutynin (DITROPAN) 5 MG tablet TAKE 1 TABLET TWICE DAILY  . [DISCONTINUED] benzonatate (TESSALON) 100 MG capsule TAKE 1 CAPSULE BY MOUTH 3 TIMES DAILY AS NEEDED FOR COUGH   No facility-administered encounter medications on file as of 11/18/2019.    Goals Addressed            This Visit's Progress   . " I have been having irritation in my pubic area" (pt-stated)                        Current Barriers:  Marland Kitchen Knowledge Deficits related to self management of irritation in pubic area  Nurse Case Manager Clinical Goal(s):  Marland Kitchen Over the next 5 days, patient will attend all scheduled medical appointments:  Interventions: >  Continue medication oxybutynin 5mg  >   Keep are dry >   Avoid any scent soaps or lotions >    Keep appointment schedule 11-25-19. >    Collaborated with the PCP  Patient Self Care  Activities:  . Patient verbalizes understanding of plan  . Self administers medications as prescribed . Calls provider office for new concerns or questions . Unable to independently Self manage irritation of pubic area  Initial goal documentation     . "My blood pressures are doing ok" (pt-stated)       Current Barriers:  . Chronic Disease Management support, education, and care coordination needs related to HTN  Clinical Goal(s) related to HTN:  Over the next 90 days, patient will:  . Work with the care management team to address educational, disease management,  and care coordination needs  . Begin or continue self health monitoring activities as directed today Call provider office for new or worsened signs and symptoms  . Call care management team with questions or concerns . Verbalize basic understanding of patient centered plan of care established today  Interventions related to HTN:  . Evaluation of current treatment plans and patient's adherence to plan as established by provider . Assessed patient understanding of disease states . Assessed patient's education and care coordination needs . Provided disease specific education to patient  . Collaborated with appropriate clinical care team members regarding patient needs  Patient Self Care Activities related to HTN:  . Patient is unable to independently self-manage chronic health conditions  Initial goal documentation     . "When I cough I have thick phlegm to fly out of my mouth" (pt-stated)       Current Barriers:  . Chronic Disease Management support, education, and care coordination needs related to  Asthma  Clinical Goal(s) related to  Asthma :  Over the next 90 days, patient will:  . Work with the care management team to address educational, disease management, and care coordination needs  . Begin or continue self health monitoring activities as directed today  . Call provider office for new or worsened signs and symptoms  . Call care management team with questions or concerns . Verbalize basic understanding of patient centered plan of care established today  Interventions related to  Asthma :  . Evaluation of current treatment plans and patient's adherence to plan as established by provider . Assessed patient understanding of disease states . Assessed patient's education and care coordination needs . Provided disease specific education to patient  . Keep humidifier in the home . Take medications as prescribed . Drink hot liquids i.e...like tea . Collaborated with appropriate  clinical care team members regarding patient needs  Patient Self Care Activities related to  Astma :  . Patient is unable to independently self-manage chronic health conditions  Initial goal documentation          Follow up plan:  The care management team will reach out to the patient again over the next 14 days.  The patient has been provided with contact information for the care management team and has been advised to call with any health related questions or concerns.   Ms. Tweet was given information about Care Management services today including:  1. Care Management services include personalized support from designated clinical staff supervised by a physician, including individualized plan of care and coordination with other care providers 2. 24/7 contact phone numbers for assistance for urgent and routine care needs. 3. The patient may stop Care Management services at any time (effective at the end of the month) by phone call to the office staff.  Patient agreed to services and verbal consent obtained.  Lazaro Arms RN, BSN, [facility] Care Management Coordinator [facility]  [place] Phone: [phone] I Office: [phone] Fax: [phone]

## 2019-11-25 ENCOUNTER — Ambulatory Visit (INDEPENDENT_AMBULATORY_CARE_PROVIDER_SITE_OTHER): Payer: Medicare HMO | Admitting: Family Medicine

## 2019-11-25 ENCOUNTER — Other Ambulatory Visit: Payer: Self-pay

## 2019-11-25 ENCOUNTER — Encounter: Payer: Self-pay | Admitting: Physical Medicine & Rehabilitation

## 2019-11-25 ENCOUNTER — Encounter
Payer: No Typology Code available for payment source | Attending: Registered Nurse | Admitting: Physical Medicine & Rehabilitation

## 2019-11-25 ENCOUNTER — Other Ambulatory Visit (HOSPITAL_COMMUNITY)
Admission: RE | Admit: 2019-11-25 | Discharge: 2019-11-25 | Disposition: A | Discharge status: Home / Self Care | Payer: Medicare HMO | Source: Ambulatory Visit | Attending: Family Medicine | Admitting: Family Medicine

## 2019-11-25 ENCOUNTER — Encounter: Payer: Self-pay | Admitting: Family Medicine

## 2019-11-25 VITALS — BP 151/106 | HR 84 | Temp 98.1°F | Ht 62.0 in | Wt 176.0 lb

## 2019-11-25 VITALS — BP 172/92 | HR 79 | Wt 176.0 lb

## 2019-11-25 DIAGNOSIS — M4722 Other spondylosis with radiculopathy, cervical region: Secondary | ICD-10-CM | POA: Diagnosis present

## 2019-11-25 DIAGNOSIS — Z79891 Long term (current) use of opiate analgesic: Secondary | ICD-10-CM | POA: Diagnosis present

## 2019-11-25 DIAGNOSIS — M5416 Radiculopathy, lumbar region: Secondary | ICD-10-CM | POA: Insufficient documentation

## 2019-11-25 DIAGNOSIS — M62838 Other muscle spasm: Secondary | ICD-10-CM | POA: Diagnosis not present

## 2019-11-25 DIAGNOSIS — Z5181 Encounter for therapeutic drug level monitoring: Secondary | ICD-10-CM | POA: Diagnosis present

## 2019-11-25 DIAGNOSIS — M7918 Myalgia, other site: Secondary | ICD-10-CM | POA: Insufficient documentation

## 2019-11-25 DIAGNOSIS — Z202 Contact with and (suspected) exposure to infections with a predominantly sexual mode of transmission: Secondary | ICD-10-CM

## 2019-11-25 DIAGNOSIS — M961 Postlaminectomy syndrome, not elsewhere classified: Secondary | ICD-10-CM | POA: Diagnosis not present

## 2019-11-25 DIAGNOSIS — F5101 Primary insomnia: Secondary | ICD-10-CM | POA: Diagnosis present

## 2019-11-25 DIAGNOSIS — G894 Chronic pain syndrome: Secondary | ICD-10-CM | POA: Insufficient documentation

## 2019-11-25 DIAGNOSIS — M542 Cervicalgia: Secondary | ICD-10-CM | POA: Diagnosis present

## 2019-11-25 DIAGNOSIS — G243 Spasmodic torticollis: Secondary | ICD-10-CM | POA: Diagnosis not present

## 2019-11-25 DIAGNOSIS — M5412 Radiculopathy, cervical region: Secondary | ICD-10-CM | POA: Diagnosis present

## 2019-11-25 LAB — POCT WET PREP (WET MOUNT)
Clue Cells Wet Prep Whiff POC: POSITIVE
Trichomonas Wet Prep HPF POC: ABSENT

## 2019-11-25 MED ORDER — METRONIDAZOLE 500 MG PO TABS: 500.0000 mg | ORAL_TABLET | Freq: Two times a day (BID) | ORAL | 0 refills | Status: DC

## 2019-11-25 MED ORDER — HYDROCODONE-ACETAMINOPHEN 7.5-325 MG PO TABS: ORAL_TABLET | ORAL | 0 refills | Status: DC

## 2019-11-25 MED ORDER — PREGABALIN 100 MG PO CAPS: 100.0000 mg | ORAL_CAPSULE | Freq: Three times a day (TID) | ORAL | 3 refills | Status: DC

## 2019-11-25 NOTE — Patient Instructions (Signed)
It was great to meet you today! Thank you for letting me participate in your care!  Today, we discussed your lower leg muscle spasms. I got blood work from you today and will be running several tests, if anything is abnormal I will call you, if everything is normal you will get a letter.  If anything is abnormal with the other test we did today I will call you as well.  Be well, Harolyn Rutherford, DO PGY-3, [facility]

## 2019-11-25 NOTE — Progress Notes (Signed)
   Procedure: Dysport injection Diagnosis: Cervical dystonia G 24.3 Dilution: 300 units in 1.5 mL sterile preservative-free normal saline  Informed consent was obtained after describing risks and benefits of the procedure with patient including bleeding bruising infection as well as the potential side effects of the medication itself. Patient elects to proceed and has given written consent.  Patient placed in a seated position Areas were marked and prepped with Betadine  Needle: 27-gauge 1 inch needle electrode connected to EMG amplifier    Left longissimus: 100 units Left splenius capitis: 150 units Left Levator scapula: 50  All injections done after negative drawback for blood. Appropriate EMG activity.   Nurse practitioner visit in 1 month to refill narcotic analgesic medication and check for compliance. PDMP reviewed appropriate

## 2019-11-25 NOTE — Assessment & Plan Note (Signed)
Unprotected sex with a new partner with subsequent itching and small amount of non-odorous discharge - Wet prep - GC/CC - RPR and HIV

## 2019-11-25 NOTE — Progress Notes (Signed)
Subjective: Chief Complaint  Patient presents with  . Vaginal Itching   HPI: Maria Griffith is a 58 y.o. presenting to clinic today to discuss the following:  Vaginal Discharge Patient endorses a few days of severe itching in her vaginal area with a small amount of white discharge that has no odor. She has recently had unprotected sex with a new partner. She has no abdominal pain, fever, nausea, vomiting. She has no rashes or skin changes. No pain at all, just the itching.  Bilateral Leg "spasms/cramps" Patient endorses 5 months of intermittent bilateral leg "spasms" where she gets a "pins and needles" sensation along both her legs that start in the thighs and go all the way down to her feet. She says it sometimes hurts as well and feels like her muscles are "tight" and cramp up so bad she is in tears.  ROS noted in HPI.    Social History   Tobacco Use  Smoking Status Never Smoker  Smokeless Tobacco Never Used   Objective: BP (!) 172/92   Pulse 79   Wt 176 lb (79.8 kg)   LMP 10/22/2014 (Approximate)   SpO2 99%   BMI 32.19 kg/m  Vitals and nursing notes reviewed  Physical Exam Vitals signs reviewed. Exam conducted with a chaperone present.  Constitutional:      Appearance: Normal appearance. She is not ill-appearing.  Genitourinary:    General: Normal vulva.     Exam position: Supine.     Pubic Area: No rash.      Labia:        Right: No rash, tenderness, lesion or injury.        Left: No tenderness, lesion or injury.      Vagina: No signs of injury. Vaginal discharge present. No erythema, tenderness, bleeding or lesions.     Cervix: Discharge present. No lesion, erythema or cervical bleeding.     Uterus: Normal.      Results for orders placed or performed in visit on 11/25/19 (from the past 72 hour(s))  POCT Wet Prep [facility])     Status: Abnormal   Collection Time: 11/25/19  3:25 PM  Result Value Ref Range   Source Wet Prep POC VAG    WBC, Wet Prep  HPF POC NONE    Bacteria Wet Prep HPF POC Few Few   Clue Cells Wet Prep HPF POC Few (A) None   Clue Cells Wet Prep Whiff POC Positive Whiff    Yeast Wet Prep HPF POC None None   KOH Wet Prep POC None None   Trichomonas Wet Prep HPF POC Absent Absent    Assessment/Plan:  Muscle spasms of both lower extremities Unclear etiology but could be from low potassium. Unlikely to be due to elevated CK given no history of injury or trauma and no high intensity exercise but will check to rule out. - Check ESR and CRP to rule out any acute inflammatory/autoimmune process - Check BMP for potassium - If all labs are normal consider EMG as next step for definitive answer if she has nerve conduction issues in her legs  Possible exposure to STD Unprotected sex with a new partner with subsequent itching and small amount of non-odorous discharge - Wet prep - GC/CC - RPR and HIV Wet Prep positive for BV. Sending Metronidazole 590m BID for 7 days. Will have patient notified that her prescription is at the pharmacy.  PATIENT EDUCATION PROVIDED: See AVS    Diagnosis and  plan along with any newly prescribed medication(s) were discussed in detail with this patient today. The patient verbalized understanding and agreed with the plan. Patient advised if symptoms worsen return to clinic or ER.    Orders Placed This Encounter  Procedures  . Sedimentation Rate  . C-reactive protein  . Basic Metabolic Panel  . CK  . HIV antibody (with reflex)  . RPR  . POCT Wet Prep [facility])    Meds ordered this encounter  Medications  . metroNIDAZOLE (FLAGYL) 500 MG tablet    Sig: Take 1 tablet (500 mg total) by mouth 2 (two) times daily.    Dispense:  21 tablet    Refill:  0     Harolyn Rutherford, DO 11/25/2019, 3:17 PM PGY-3 [place]

## 2019-11-25 NOTE — Patient Instructions (Signed)

## 2019-11-25 NOTE — Assessment & Plan Note (Signed)
Unclear etiology but could be from low potassium. Unlikely to be due to elevated CK given no history of injury or trauma and no high intensity exercise but will check to rule out. - Check ESR and CRP to rule out any acute inflammatory/autoimmune process - Check BMP for potassium - If all labs are normal consider EMG as next step for definitive answer if she has nerve conduction issues in her legs

## 2019-11-26 ENCOUNTER — Telehealth: Payer: Self-pay | Admitting: Physician Assistant

## 2019-11-26 ENCOUNTER — Encounter: Payer: Self-pay | Admitting: Family Medicine

## 2019-11-26 LAB — BASIC METABOLIC PANEL
BUN/Creatinine Ratio: 16 (ref 9–23)
BUN: 16 mg/dL (ref 6–24)
CO2: 23 mmol/L (ref 20–29)
Calcium: 9.5 mg/dL (ref 8.7–10.2)
Chloride: 103 mmol/L (ref 96–106)
Creatinine, Ser: 0.99 mg/dL (ref 0.57–1.00)
GFR calc Af Amer: 73 mL/min/{1.73_m2} (ref >59)
GFR calc non Af Amer: 63 mL/min/{1.73_m2} (ref >59)
Glucose: 84 mg/dL (ref 65–99)
Potassium: 4.4 mmol/L (ref 3.5–5.2)
Sodium: 140 mmol/L (ref 134–144)

## 2019-11-26 LAB — CERVICOVAGINAL ANCILLARY ONLY
Chlamydia: NEGATIVE
Comment: NEGATIVE
Comment: NORMAL
Neisseria Gonorrhea: NEGATIVE

## 2019-11-26 LAB — SEDIMENTATION RATE: Sed Rate: 29 mm/hr (ref 0–40)

## 2019-11-26 LAB — RPR: RPR Ser Ql: NONREACTIVE

## 2019-11-26 LAB — HIV ANTIBODY (ROUTINE TESTING W REFLEX): HIV Screen 4th Generation wRfx: NONREACTIVE

## 2019-11-26 LAB — CK: Total CK: 266 U/L — ABNORMAL HIGH (ref 32–182)

## 2019-11-26 LAB — C-REACTIVE PROTEIN: CRP: <1 mg/L (ref 0–10)

## 2019-11-26 NOTE — Telephone Encounter (Signed)
New message   Patient calling to verify medication list, unsure of the BP medication she should be taking

## 2019-11-26 NOTE — Telephone Encounter (Signed)
Dr. Angelena Form pt last seen by Ermalinda Barrios, PAC. Pt has appt with Dr. Angelena Form 12/18/19 @ 10 am.

## 2019-11-26 NOTE — Telephone Encounter (Signed)
Called and spoke to patient. She is calling to confirm which BP meds she should be taking. She states that her BP has been running high (172/109 and 172/92) for the past few days. She states that she accidentally put her spironolactone bottle with the medicines that she takes instead of her hydralazine. For the past several days she has taken spironolactone 50 mg and not her hydralazine 50 mg TID. Last dose of spironoalctone this AM. She took her afternoon dose of hydralazine earlier and her BP has come down to 154/97. Made patient aware that her spironolactone was d/c'd due to hair loss. Patient asymptomatic at this time. Instructed patient to stop her spironolactone and starting tomorrow she should be taking losartan 100 mg QD and hydralazine 50 mg TID. Instructed patient to check her BP later today and if SBP > 110 then she can take the hydralazine. Instructed patient to monitor her BP for a few days and let us know if it does not normalize or if she becomes symptomatic. Patient verbalized understanding and thanked me for the call.

## 2019-11-26 NOTE — Progress Notes (Signed)
Normal lab results, sending to patient

## 2019-11-27 NOTE — Telephone Encounter (Signed)
Agree. Thanks

## 2019-11-28 ENCOUNTER — Other Ambulatory Visit: Payer: Self-pay

## 2019-11-28 DIAGNOSIS — Z20822 Contact with and (suspected) exposure to covid-19: Secondary | ICD-10-CM

## 2019-11-30 LAB — NOVEL CORONAVIRUS, NAA: SARS-CoV-2, NAA: NOT DETECTED

## 2019-12-01 ENCOUNTER — Inpatient Hospital Stay: Payer: Medicare HMO | Admitting: Hematology

## 2019-12-01 ENCOUNTER — Inpatient Hospital Stay: Payer: Medicare HMO

## 2019-12-15 DIAGNOSIS — U071 COVID-19: Secondary | ICD-10-CM

## 2019-12-15 DIAGNOSIS — I10 Essential (primary) hypertension: Secondary | ICD-10-CM | POA: Diagnosis not present

## 2019-12-15 HISTORY — DX: COVID-19: U07.1

## 2019-12-16 ENCOUNTER — Other Ambulatory Visit: Payer: Self-pay | Admitting: Family Medicine

## 2019-12-16 ENCOUNTER — Ambulatory Visit: Payer: Self-pay

## 2019-12-16 ENCOUNTER — Other Ambulatory Visit: Payer: Self-pay

## 2019-12-16 ENCOUNTER — Ambulatory Visit: Payer: Medicare HMO | Admitting: Pharmacist

## 2019-12-16 DIAGNOSIS — J45909 Unspecified asthma, uncomplicated: Secondary | ICD-10-CM

## 2019-12-16 MED ORDER — FLUTICASONE-SALMETEROL 250-50 MCG/DOSE IN AEPB: 1.0000 | INHALATION_SPRAY | Freq: Two times a day (BID) | RESPIRATORY_TRACT | 1 refills | Status: DC

## 2019-12-16 NOTE — Progress Notes (Signed)
Chronic Care Management   Initial Visit Note  12/16/2019 Name: Maria Griffith MRN: [id number] DOB: [birth year]  Referred by: Kinnie [name], MD Reason for referral : Chronic Care Management   Maria Griffith is a 58 y.o. year old female who is a primary care patient of Kinnie [name], MD. The CCM team was consulted for assistance with chronic disease management and care coordination needs related to asthma  Review of patient status, including review of consultants reports, relevant laboratory and other test results, and collaboration with appropriate care team members and the patient's provider was performed as part of comprehensive patient evaluation and provision of chronic care management services.    I spoke with Maria Griffith by telephone today.  Medications: Outpatient Encounter Medications as of 12/16/2019  Medication Sig  . albuterol (PROVENTIL) (2.5 MG/3ML) 0.083% nebulizer solution INHALE THE CONTENTS OF 1 VIAL VIA NEBULIZER EVERY 6 HOURS AS NEEDED FOR WHEEZING OR SHORTNESS OF BREATH  . albuterol (VENTOLIN HFA) 108 (90 Base) MCG/ACT inhaler INHALE 2 PUFFS into THE lungs EVERY 6 HOURS AS NEEDED FOR WHEEZING  . benzonatate (TESSALON) 100 MG capsule TAKE 1 CAPSULE BY MOUTH 3 TIMES DAILY AS NEEDED FOR COUGH  . diclofenac sodium (VOLTAREN) 1 % GEL APPLY 2 grams topically 4 TIMES DAILY  . hydrALAZINE (APRESOLINE) 50 MG tablet Take 1 tablet (50 mg total) by mouth 3 (three) times daily.  Marland Kitchen HYDROcodone-acetaminophen (NORCO) 7.5-325 MG tablet TAKE 1 TABLET BY MOUTH EVERY 6 HOURS AS NEEDED FOR MODERATE PAIN  . loratadine (CLARITIN) 10 MG tablet TAKE 1 TABLET BY MOUTH EVERY DAY AS NEEDED FOR allergies  . losartan (COZAAR) 100 MG tablet Take 1 tablet (100 mg total) by mouth daily.  . metroNIDAZOLE (FLAGYL) 500 MG tablet Take 1 tablet (500 mg total) by mouth 2 (two) times daily.  Marland Kitchen oxybutynin (DITROPAN) 5 MG tablet Take 1 tablet (5 mg total) by mouth 2 (two) times daily.  . polyethylene  glycol powder (GAVILAX) powder Mix 17 grams IN EIGHT ounces OF liquid AND drink EVERY DAY AS DIRECTED  . pregabalin (LYRICA) 100 MG capsule Take 1 capsule (100 mg total) by mouth 3 (three) times daily.  Marland Kitchen senna-docusate (GNP STOOL SOFTENER/LAXATIVE) 8.6-50 MG tablet Take 1 tablet by mouth at bedtime.  Marland Kitchen tiZANidine (ZANAFLEX) 4 MG tablet TAKE 1 TABLET BY MOUTH 3 TIMES DAILY  . traZODone (DESYREL) 150 MG tablet TAKE 1 TABLET BY MOUTH AT BEDTIME  . Vitamin D, Ergocalciferol, (DRISDOL) 1.25 MG (50000 UT) CAPS capsule Take 1 capsule (50,000 Units total) by mouth every 7 (seven) days.  . [DISCONTINUED] benzonatate (TESSALON) 100 MG capsule TAKE 1 CAPSULE BY MOUTH 3 TIMES DAILY AS NEEDED FOR COUGH  . [DISCONTINUED] Fluticasone-Salmeterol (ADVAIR DISKUS) 250-50 MCG/DOSE AEPB Inhale 1 puff into the lungs 2 (two) times daily.   No facility-administered encounter medications on file as of 12/16/2019.     Objective:   Goals Addressed            This Visit's Progress     Patient Stated   . I would like my breathing to be better (pt-stated)       Current Barriers:  . Asthma; complex patient with multiple comorbidities including asthma, HTN . Patient does not like to take a lot of medications . COVID 19 positive as of 12/15/19 . Nebulizer not working  Pharmacist Clinical Goal(s):  Marland Kitchen Over the next 90 days, patient will work with PharmD and provider towards optimized medication management of asthma/chronic conditions  Interventions: . Call placed to [place] to ensure albuterol inhaler and tessalon delivered (completed) . Patient was able to use albuterol while on the phone with me (2 puffs) . Encouraged patient to go to the ED if worsening SOB/wheezing . Encouraged compliance with Advair and albuterol (advair called in) . CCM RN CM, Lazaro Arms, is working on getting patient new nebulizer--current nebulizer isn't working per patient . Continue current management: o Albuterol PRN  (inhaler) o Advair BID o Tessalon PRN  Patient Self Care Activities:  . Patient will take medications as prescribed . Patient will focus on improved adherence by continuing to use inhalers as prescribed.  Avoiding triggers--cold, stay at home safe practices  Initial goal documentation         Plan:   The care management team will reach out to the patient again over the next 5 days.   Provider Signature Regina Eck, PharmD, BCPS Clinical Pharmacist, [facility] [place]: [phone]

## 2019-12-16 NOTE — Chronic Care Management (AMB) (Signed)
   RN Case Manager Care Management Consultation  12/16/2019 Name: Maria Griffith MRN: [id number] DOB: [birth year]   Maria Griffith is a 58 y.o. year old female who sees Kinnie [name], MD for primary care. RN Case Manager was consulted by Gaspar Garbe D for information /resources to assistance patient with  Care Coordination for new Nebulizer. Patient was not seen during this encounter however RN Case Manager reviewed chart, notes, insurance etc .     Recommendation: After consultation with provider it is determined that patient may benefit from new DME equipment.    Intervention:RN Case Manager requested an order from PCP so the information can be  faxed to [facility] for a new nebulizer.   Plan:  RN Case Manager will call the patient and inform her of the plan and have the information faxed into [employer].  Lazaro Arms RN, BSN, [facility] Care Management Coordinator [place] Phone: [phone] Office: [phone] Fax: [phone] ]

## 2019-12-17 ENCOUNTER — Ambulatory Visit: Payer: Self-pay

## 2019-12-18 ENCOUNTER — Ambulatory Visit: Payer: Medicare HMO | Admitting: Cardiovascular Disease

## 2019-12-23 ENCOUNTER — Other Ambulatory Visit: Payer: Self-pay

## 2019-12-23 ENCOUNTER — Telehealth: Payer: Medicare HMO

## 2019-12-23 NOTE — Chronic Care Management (AMB) (Signed)
  Care Management   Follow Up Note   12/23/2019 Name: Maria Griffith MRN: [id number] DOB: February 07, 1961  Referred by: Kinnie [name], MD Reason for referral : Care Coordination (Care Management RNCM)   Maria Griffith is a 59 y.o. year old female who is a primary care patient of Kinnie [name], MD. The care management team was consulted for assistance with care management and care coordination needs.    Review of patient status, including review of consultants reports, relevant laboratory and other test results, and collaboration with appropriate care team members and the patient's provider was performed as part of comprehensive patient evaluation and provision of chronic care management services.    SDOH (Social Determinants of Health) screening performed today: None. See Care Plan for related entries.   Advanced Directives: See Care Plan and [employer] application for related entries.   Goals Addressed            This Visit's Progress   . I would like my breathing to be better (pt-stated)       Current Barriers:  . Asthma; complex patient with multiple comorbidities including asthma, HTN . Patient does not like to take a lot of medications . COVID 19 positive as of 12/15/19 . Nebulizer not working  Pharmacist Clinical Goal(s):  Marland Kitchen Over the next 90 days, patient will work with PharmD and provider towards optimized medication management of asthma/chronic conditions  Interventions: . Call placed to [place] to ensure albuterol inhaler and tessalon delivered (completed) . Patient was able to use albuterol while on the phone with me (2 puffs) . Encouraged patient to go to the ED if worsening SOB/wheezing . Encouraged compliance with Advair and albuterol (advair called in-message routed to PCP for updates) . CCM RN CM, Lazaro Arms, is working on getting patient new nebulizer--current nebulizer isn't working per patient RNCM had paper work sent in to [employer] for new  nebulizer.  Called the patient at 415 pm to inform her of the information. Went through checking her nebulizer while on the phone connections, tubing, and cup for instillation of medication and nothing helped.  Then I asked the patient did she have any more tubing and she did.  After replacing the tubing, the nebulizer worked and she was able to perform a breathing treatment.  . Continue current management: o Albuterol PRN (inhaler) o Advair BID o Tessalon PRN  Patient Self Care Activities:  . Patient will take medications as prescribed . Patient will focus on improved adherence by continuing to use inhalers as prescribed.  Avoiding triggers--cold, stay at home safe practices  Initial goal documentation         Telephone follow up appointment with care management team member scheduled for:12-24-19 The patient has been provided with contact information for the care management team and has been advised to call with any health related questions or concerns.   Lazaro Arms RN, BSN, [facility] Care Management Coordinator [place] Phone: [phone] Office: [phone] Fax: [phone]

## 2019-12-23 NOTE — Patient Instructions (Signed)
Visit Information  Goals Addressed            This Visit's Progress   . I would like my breathing to be better (pt-stated)       Current Barriers:  . Asthma; complex patient with multiple comorbidities including asthma, HTN . Patient does not like to take a lot of medications . COVID 19 positive as of 12/15/19 . Nebulizer not working  Pharmacist Clinical Goal(s):  Marland Kitchen Over the next 90 days, patient will work with PharmD and provider towards optimized medication management of asthma/chronic conditions  Interventions: . Call placed to [employer] to ensure albuterol inhaler and tessalon delivered (completed) . Patient was able to use albuterol while on the phone with me (2 puffs) . Encouraged patient to go to the ED if worsening SOB/wheezing . Encouraged compliance with Advair and albuterol (advair called in-message routed to PCP for updates) . CCM RN CM, Lazaro Arms, is working on getting patient new nebulizer--current nebulizer isn't working per patient RNCM had paper work sent in to [facility] for new nebulizer.  Called the patient at 415 pm to inform her of the information. Went through checking her nebulizer while on the phone connections, tubing, and cup for instillation of medication and nothing helped.  Then I asked the patient did she have any more tubing and she did.  After replacing the tubing, the nebulizer worked and she was able to perform a breathing treatment.  . Continue current management: o Albuterol PRN (inhaler) o Advair BID o Tessalon PRN  Patient Self Care Activities:  . Patient will take medications as prescribed . Patient will focus on improved adherence by continuing to use inhalers as prescribed.  Avoiding triggers--cold, stay at home safe practices  Initial goal documentation        [name] was given information about Care Management services today including:  1. Care Management services include personalized support from designated clinical  staff supervised by her physician, including individualized plan of care and coordination with other care providers 2. 24/7 contact phone numbers for assistance for urgent and routine care needs. 3. The patient may stop CCM services at any time (effective at the end of the month) by phone call to the office staff.  Patient agreed to services and verbal consent obtained.   The patient verbalized understanding of instructions provided today and declined a print copy of patient instruction materials.   Telephone follow up appointment with care management team member scheduled for:12/24/19 The patient has been provided with contact information for the care management team and has been advised to call with any health related questions or concerns.   Lazaro Arms RN, BSN, [facility] Care Management Coordinator [place] Phone: [phone] Office: [phone] Fax: [phone]

## 2019-12-24 ENCOUNTER — Ambulatory Visit: Payer: Medicare HMO

## 2019-12-24 NOTE — Patient Instructions (Signed)
Visit Information  Goals Addressed            This Visit's Progress   . "My blood pressures are doing ok" (pt-stated)       Current Barriers:  . Chronic Disease Management support, education, and care coordination needs related to HTN  Clinical Goal(s) related to HTN:  Over the next 90 days, patient will:  . Work with the care management team to address educational, disease management, and care coordination needs  . Begin or continue self health monitoring activities as directed today Call provider office for new or worsened signs and symptoms  . Call care management team with questions or concerns . Verbalize basic understanding of patient centered plan of care established today  Interventions related to HTN:  . Evaluation of current treatment plans and patient's adherence to plan as established by provider . Assessed patient understanding of disease states . Assessed patient's education and care coordination needs . Provided disease specific education to patient  . Collaborated with appropriate clinical care team members regarding patient needs  . 12/24/19 . Patient has not been feeling well enough to check her blood pressures daily . She checked her blood pressure while on the phone with me 117/74 . Patient states that she has been .     Patient Self Care Activities related to HTN:  . Patient is unable to independently self-manage chronic health conditions  Initial goal documentation     . I would like my breathing to be better (pt-stated)       Current Barriers:  . Asthma; complex patient with multiple comorbidities including asthma, HTN . Patient does not like to take a lot of medications . COVID 19 positive as of 12/15/19 . Nebulizer not working  Pharmacist Clinical Goal(s):  Marland Kitchen Over the next 90 days, patient will work with PharmD and provider towards optimized medication management of asthma/chronic conditions  Interventions: . Call placed to [place]  to ensure albuterol inhaler and tessalon delivered (completed) . Patient was able to use albuterol while on the phone with me (2 puffs) . Encouraged patient to go to the ED if worsening SOB/wheezing . Encouraged compliance with Advair and albuterol (advair called in-message routed to PCP for updates) . CCM RN CM, Lazaro Arms, is working on getting patient new nebulizer--current nebulizer isn't working per patient RNCM had paper work sent in to [facility] for new nebulizer.  Called the patient at 415 pm to inform her of the information. Went through checking her nebulizer while on the phone connections, tubing, and cup for instillation of medication and nothing helped.  Then I asked the patient did she have any more tubing and she did.  After replacing the tubing, the nebulizer worked and she was able to perform a breathing treatment. 12/24/19 . Patient states that she has been able to hold down food since yesterday . She is breathing better and using her nebulizer and albuterol inhaler . She states that she does not have the Adviar Diskus 250-50 due to cost . Collaborated with Lottie Stickley Pharm D and Nicholas Lose to get patient samples . Medication Samples have been provided to the patient. .  . Drug name: Adair Patter     Strength: 200/25   mcg     Qty: 2  LOT: [id number]  Exp.Date: [facility] 2021 .  Marland Kitchen Dosing instructions: 1 inhalation daily .  Marland Kitchen The patient has been instructed regarding the correct time, dose, and frequency of taking this medication,  including desired effects and most common side effects.  Marland Kitchen  Lazaro Arms . 2:40 PM . 12/24/2019 . Patient has been notified Daughter will pick up her medication.  Daughter will be given instruction on how to use.  . Daughter picked up samples at 315 pm. Given instruction about inhaler and told about paperwork for med assistance coming tin her mother.   . Continue current management: o Albuterol PRN (inhaler) o Advair BID o Tessalon PRN  Patient Self  Care Activities:  . Patient will take medications as prescribed . Patient will focus on improved adherence by continuing to use inhalers as prescribed.  Avoiding triggers--cold, stay at home safe practices  Initial goal documentation        Ms. Maclean was given information about Care Management services today including:  1. Care Management services include personalized support from designated clinical staff supervised by her physician, including individualized plan of care and coordination with other care providers 2. 24/7 contact phone numbers for assistance for urgent and routine care needs. 3. The patient may stop CCM services at any time (effective at the end of the month) by phone call to the office staff.  Patient agreed to services and verbal consent obtained.   The patient verbalized understanding of instructions provided today and declined a print copy of patient instruction materials.   Telephone follow up appointment with care management team member scheduled for:01/09/20  The patient has been provided with contact information for the care management team and has been advised to call with any health related questions or concerns.   Lazaro Arms RN, BSN, [facility] Care Management Coordinator [place] Phone: [phone] Fax: [phone]

## 2019-12-24 NOTE — Chronic Care Management (AMB) (Signed)
Care Management   Follow Up Note   12/24/2019 Name: Maria Griffith MRN: [id number] DOB: Oct 30, 1961  Referred by: Kinnie [name], MD Reason for referral : Care Coordination (Care Management HTN/ Asthma)   Maria Griffith is a 59 y.o. year old female who is a primary care patient of Kinnie [name], MD. The care management team was consulted for assistance with care management and care coordination needs.    Review of patient status, including review of consultants reports, relevant laboratory and other test results, and collaboration with appropriate care team members and the patient's provider was performed as part of comprehensive patient evaluation and provision of chronic care management services.    SDOH (Social Determinants of Health) screening performed today: None. See Care Plan for related entries.   Advanced Directives: See Care Plan and Vynca application for related entries.   Goals Addressed            This Visit's Progress   . "My blood pressures are doing ok" (pt-stated)       Current Barriers:  . Chronic Disease Management support, education, and care coordination needs related to HTN  Clinical Goal(s) related to HTN:  Over the next 90 days, patient will:  . Work with the care management team to address educational, disease management, and care coordination needs  . Begin or continue self health monitoring activities as directed today Call provider office for new or worsened signs and symptoms  . Call care management team with questions or concerns . Verbalize basic understanding of patient centered plan of care established today  Interventions related to HTN:  . Evaluation of current treatment plans and patient's adherence to plan as established by provider . Assessed patient understanding of disease states . Assessed patient's education and care coordination needs . Provided disease specific education to patient  . Collaborated with appropriate clinical  care team members regarding patient needs  . 12/24/19 . Patient has not been feeling well enough to check her blood pressures daily . She checked her blood pressure while on the phone with me 117/74 . Patient states that she has been .     Patient Self Care Activities related to HTN:  . Patient is unable to independently self-manage chronic health conditions  Initial goal documentation     . I would like my breathing to be better (pt-stated)       Current Barriers:  . Asthma; complex patient with multiple comorbidities including asthma, HTN . Patient does not like to take a lot of medications . COVID 19 positive as of 12/15/19 . Nebulizer not working  Pharmacist Clinical Goal(s):  Marland Kitchen Over the next 90 days, patient will work with PharmD and provider towards optimized medication management of asthma/chronic conditions  Interventions: . Call placed to [place] to ensure albuterol inhaler and tessalon delivered (completed) . Patient was able to use albuterol while on the phone with me (2 puffs) . Encouraged patient to go to the ED if worsening SOB/wheezing . Encouraged compliance with Advair and albuterol (advair called in-message routed to PCP for updates) . CCM RN CM, Lazaro Arms, is working on getting patient new nebulizer--current nebulizer isn't working per patient RNCM had paper work sent in to [facility] for new nebulizer.  Called the patient at 415 pm to inform her of the information. Went through checking her nebulizer while on the phone connections, tubing, and cup for instillation of medication and nothing helped.  Then I asked the patient did  she have any more tubing and she did.  After replacing the tubing, the nebulizer worked and she was able to perform a breathing treatment. 12/24/19 . Patient states that she has been able to hold down food since yesterday . She is breathing better and using her nebulizer and albuterol inhaler . She states that she does not have  the Adviar Diskus 250-50 due to cost . Collaborated with Lottie Ceasar Pharm D and Nicholas Lose to get patient samples . Medication Samples have been provided to the patient. .  . Drug name: Adair Patter     Strength: 200/25   mcg     Qty: 2  LOT: [id number]  Exp.Date: [facility] 2021 .  Marland Kitchen Dosing instructions: 1 inhalation daily .  Marland Kitchen The patient has been instructed regarding the correct time, dose, and frequency of taking this medication, including desired effects and most common side effects.  Marland Kitchen  Lazaro Arms . 2:40 PM . 12/24/2019 . Patient has been notified Daughter will pick up her medication.  Daughter will be given instruction on how to use.  . Daughter picked up samples at 315 pm. Given instruction about inhaler and told about paperwork for med assistance coming tin her mother.   . Continue current management: o Albuterol PRN (inhaler) o Advair BID o Tessalon PRN  Patient Self Care Activities:  . Patient will take medications as prescribed . Patient will focus on improved adherence by continuing to use inhalers as prescribed.  Avoiding triggers--cold, stay at home safe practices  Initial goal documentation         Telephone follow up appointment with care management team member scheduled for:01/09/20 The patient has been provided with contact information for the care management team and has been advised to call with any health related questions or concerns.   Lazaro Arms RN, BSN, [facility] Care Management Coordinator [place] Phone: [phone] Fax: [phone]

## 2019-12-25 ENCOUNTER — Ambulatory Visit: Payer: Self-pay | Admitting: Pharmacist

## 2019-12-25 DIAGNOSIS — J45909 Unspecified asthma, uncomplicated: Secondary | ICD-10-CM

## 2019-12-26 ENCOUNTER — Telehealth: Payer: Self-pay

## 2019-12-26 ENCOUNTER — Ambulatory Visit: Payer: Medicare HMO | Admitting: Physical Medicine & Rehabilitation

## 2019-12-26 MED ORDER — SENNOSIDES-DOCUSATE SODIUM 8.6-50 MG PO TABS: 1.0000 | ORAL_TABLET | Freq: Every day | ORAL | 2 refills | Status: DC

## 2019-12-26 MED ORDER — TIZANIDINE HCL 4 MG PO TABS: ORAL_TABLET | ORAL | 2 refills | Status: DC

## 2019-12-26 MED ORDER — TRAZODONE HCL 150 MG PO TABS: 150.0000 mg | ORAL_TABLET | Freq: Every day | ORAL | 2 refills | Status: DC

## 2019-12-26 MED ORDER — PREGABALIN 100 MG PO CAPS: 100.0000 mg | ORAL_CAPSULE | Freq: Three times a day (TID) | ORAL | 3 refills | Status: DC

## 2019-12-26 MED ORDER — HYDROCODONE-ACETAMINOPHEN 7.5-325 MG PO TABS: ORAL_TABLET | ORAL | 0 refills | Status: DC

## 2019-12-26 MED ORDER — DICLOFENAC SODIUM 1 % EX GEL: 2.0000 g | Freq: Four times a day (QID) | CUTANEOUS | 2 refills | Status: DC

## 2019-12-26 MED ORDER — POLYETHYLENE GLYCOL 3350 17 GM/SCOOP PO POWD: ORAL | 2 refills | Status: DC

## 2019-12-26 NOTE — Telephone Encounter (Signed)
Patient called today stating is running out on all clinically prescribed medications and is out also of her pain medications as well.  Noted that her insurance is workman's comp so no prior auth needed by [place] at this time. Checked PMP to verify last dispensing of medication for controlled meds.  Fill Date Written             Drug                                      Qty  Prescriber 11/25/2019 11/25/2019 Hydrocodone-Acetamin 7.5-325   120.00 An Kir  Have refilled all clinically prescribed non-controlled medications via clinical protocol. Will forward the two controlled medications to [facility] or Dr. Letta Pate.

## 2019-12-26 NOTE — Telephone Encounter (Signed)
PMP was Reviewed: Lyrica and Hydrocodone was e-scribed. Will have Bruce CMA call [name] regarding the above.

## 2019-12-30 ENCOUNTER — Ambulatory Visit: Payer: Medicare HMO | Admitting: Pharmacist

## 2019-12-30 DIAGNOSIS — J45909 Unspecified asthma, uncomplicated: Secondary | ICD-10-CM

## 2019-12-30 DIAGNOSIS — Z09 Encounter for follow-up examination after completed treatment for conditions other than malignant neoplasm: Secondary | ICD-10-CM | POA: Diagnosis not present

## 2019-12-30 DIAGNOSIS — Z8616 Personal history of COVID-19: Secondary | ICD-10-CM | POA: Diagnosis not present

## 2019-12-30 NOTE — Progress Notes (Signed)
Chronic Care Management   Visit Note  12/25/2019 Name: Maria Griffith MRN: [id number] DOB: June 08, 1961  Referred by: Kinnie [name], MD Reason for referral : Chronic Care Management   Maria Griffith is a 59 y.o. year old female who is a primary care patient of Kinnie [name], MD. The CCM team was consulted for assistance with chronic disease management and care coordination needs related to Asthma  Review of patient status, including review of consultants reports, relevant laboratory and other test results, and collaboration with appropriate care team members and the patient's provider was performed as part of comprehensive patient evaluation and provision of chronic care management services.     Medications: Outpatient Encounter Medications as of 12/25/2019  Medication Sig  . albuterol (PROVENTIL) (2.5 MG/3ML) 0.083% nebulizer solution INHALE THE CONTENTS OF 1 VIAL VIA NEBULIZER EVERY 6 HOURS AS NEEDED FOR WHEEZING OR SHORTNESS OF BREATH  . albuterol (VENTOLIN HFA) 108 (90 Base) MCG/ACT inhaler INHALE 2 PUFFS into THE lungs EVERY 6 HOURS AS NEEDED FOR WHEEZING  . benzonatate (TESSALON) 100 MG capsule TAKE 1 CAPSULE BY MOUTH 3 TIMES DAILY AS NEEDED FOR COUGH  . Fluticasone-Salmeterol (ADVAIR DISKUS) 250-50 MCG/DOSE AEPB Inhale 1 puff into the lungs 2 (two) times daily.  . hydrALAZINE (APRESOLINE) 50 MG tablet Take 1 tablet (50 mg total) by mouth 3 (three) times daily.  Marland Kitchen loratadine (CLARITIN) 10 MG tablet TAKE 1 TABLET BY MOUTH EVERY DAY AS NEEDED FOR allergies  . losartan (COZAAR) 100 MG tablet Take 1 tablet (100 mg total) by mouth daily.  . metroNIDAZOLE (FLAGYL) 500 MG tablet Take 1 tablet (500 mg total) by mouth 2 (two) times daily.  Marland Kitchen oxybutynin (DITROPAN) 5 MG tablet Take 1 tablet (5 mg total) by mouth 2 (two) times daily.  . Vitamin D, Ergocalciferol, (DRISDOL) 1.25 MG (50000 UT) CAPS capsule Take 1 capsule (50,000 Units total) by mouth every 7 (seven) days.  . [DISCONTINUED]  benzonatate (TESSALON) 100 MG capsule TAKE 1 CAPSULE BY MOUTH 3 TIMES DAILY AS NEEDED FOR COUGH  . [DISCONTINUED] diclofenac sodium (VOLTAREN) 1 % GEL APPLY 2 grams topically 4 TIMES DAILY  . [DISCONTINUED] HYDROcodone-acetaminophen ([place]) 7.5-325 MG tablet TAKE 1 TABLET BY MOUTH EVERY 6 HOURS AS NEEDED FOR MODERATE PAIN  . [DISCONTINUED] polyethylene glycol powder (GAVILAX) powder Mix 17 grams IN EIGHT ounces OF liquid AND drink EVERY DAY AS DIRECTED  . [DISCONTINUED] pregabalin (LYRICA) 100 MG capsule Take 1 capsule (100 mg total) by mouth 3 (three) times daily.  . [DISCONTINUED] senna-docusate (GNP STOOL SOFTENER/LAXATIVE) 8.6-50 MG tablet Take 1 tablet by mouth at bedtime.  . [DISCONTINUED] tiZANidine (ZANAFLEX) 4 MG tablet TAKE 1 TABLET BY MOUTH 3 TIMES DAILY  . [DISCONTINUED] traZODone (DESYREL) 150 MG tablet TAKE 1 TABLET BY MOUTH AT BEDTIME   No facility-administered encounter medications on file as of 12/25/2019.     Objective:   Goals Addressed            This Visit's Progress     Patient Stated   . I would like my asthma to better controlled (pt-stated)       Current Barriers:  . Asthma; complex patient with multiple comorbidities including asthma, HTN . Patient does not like to take a lot of medications . COVID 19 positive on 12/15/19, slowly recovering  Pharmacist Clinical Goal(s):  Marland Kitchen Over the next 90 days, patient will work with PharmD and provider towards optimized medication management of asthma/chronic conditions  Interventions: . Encouraged patient to  go to the ED if worsening SOB/wheezing . Encouraged compliance with BreoEllipta and albuterol (advair called in-message routed to PCP for updates)--can't afford, will obtain patient assistance . CCM RN CM, Lazaro Arms, is working on getting patient new nebulizer--current nebulizer isn't working per patient RNCM had paper work sent in to [facility] for new nebulizer.  Called the patient at 415 pm to inform her of the  information. Went through checking her nebulizer while on the phone connections, tubing, and cup for instillation of medication and nothing helped.  Then I asked the patient did she have any more tubing and she did.  After replacing the tubing, the nebulizer worked and she was able to perform a breathing treatment. 12/25/19 . Patient states that she has been able to hold down food since yesterday . She is breathing better and using her nebulizer and albuterol inhaler . She states that she does not have the Adviar Diskus 250-50 due to cost o Will attempt to apply for [employer] ([place])--patient does not qualify for [employer] or astra zeneca inhalers.  Goal is to obtain ICS,[place] . Collaborated with Lottie [place] Pharm D and Nicholas Lose to get patient samples o Medication Samples have been provided to the patient Lourena Simmonds) o Drug name: Adair Patter     Strength: 200/25   mcg     Qty: 2  LOT: [id number]  Exp.Date: [facility] 2021 o Dosing instructions: 1 inhalation daily o The patient has been instructed regarding the correct time, dose, and frequency of taking this medication, including desired effects and most common side effects.  o Patient has been notified Daughter will pick up her medication.  Daughter will be given instruction on how to use.  o Daughter picked up samples at 315 pm. Given instruction about inhaler and told about paperwork for med assistance coming tin her mother.   . Continue current management: o Albuterol PRN (inhaler) o BREOellipta QD o Tessalon PRN o Nebulized albuterol as needed  Patient Self Care Activities:  . Patient will take medications as prescribed . Patient will focus on improved adherence by continuing to use inhalers as prescribed.  Avoiding triggers--cold, stay at home safe practices  Initial goal documentation         Plan:   The care management team will reach out to the patient again over the next 30 days.   Provider Signature Regina Eck, PharmD, BCPS  Clinical Pharmacist, [place] Name: [phone]

## 2019-12-30 NOTE — Progress Notes (Signed)
  Chronic Care Management   Outreach Note  12/30/2019 Name: Maria Griffith MRN: [id number] DOB: 11/30/1961  Referred by: Kinnie [name], MD Reason for referral : Chronic Care Management   An unsuccessful telephone outreach was attempted today. The patient was referred to the case management team by for assistance with care management and care coordination.   Follow Up Plan: The care management team will reach out to the patient again over the next 5-7  days.  Received recording: patient is not accepting calls at this time.  Regina Eck, PharmD, BCPS Clinical Pharmacist, [place]: [phone]

## 2020-01-06 ENCOUNTER — Other Ambulatory Visit: Payer: Medicare HMO

## 2020-01-06 ENCOUNTER — Ambulatory Visit: Payer: Medicare HMO | Admitting: Hematology

## 2020-01-08 ENCOUNTER — Other Ambulatory Visit: Payer: Self-pay

## 2020-01-08 ENCOUNTER — Ambulatory Visit: Payer: Medicare HMO | Admitting: Pharmacist

## 2020-01-08 NOTE — Progress Notes (Signed)
  Chronic Care Management   Outreach Note  01/08/2020 Name: Maria Griffith MRN: [id number] DOB: [birth year]  Referred by: Kinnie [name], MD Reason for referral : Chronic Care Management (COPD)   A second unsuccessful telephone outreach was attempted today. The patient was referred to the case management team for assistance with care management and care coordination.   Follow Up Plan: A HIPPA compliant phone message was left for the patient providing contact information and requesting a return call.  The care management team will reach out to the patient again over the next 7-10 days.   SIGNATURE Regina Eck, PharmD, BCPS Clinical Pharmacist, [place]: [phone]

## 2020-01-09 ENCOUNTER — Other Ambulatory Visit: Payer: Self-pay

## 2020-01-09 ENCOUNTER — Ambulatory Visit: Payer: Medicare HMO

## 2020-01-09 NOTE — Chronic Care Management (AMB) (Signed)
  Care Management   Outreach Note  01/09/2020 Name: Maria Griffith MRN: [id number] DOB: 1961-03-25  Referred by: Kinnie [name], MD Reason for referral : Care Coordination (Care Management RNCM HTN/Asthma)   An unsuccessful telephone outreach was attempted today. The patient was referred to the case management team by for assistance with care management and care coordination.   Follow Up Plan: Phone rang 6 times unable to leave a messsage no voice mail pickup.  The care management team will reach out to the patient again over the next 14 days.   Lazaro Arms RN, BSN, [facility] Care Management Coordinator [place] Phone: [phone] Fax: [phone]

## 2020-01-12 ENCOUNTER — Telehealth: Payer: Self-pay | Admitting: Physical Medicine & Rehabilitation

## 2020-01-12 NOTE — Telephone Encounter (Signed)
Looks ok repeat Botox after 02/23/2020

## 2020-01-12 NOTE — Telephone Encounter (Signed)
The patient called and requested to cancel  the Trigerpoint injections 01/16/20 and schedule another appointment Botox Injection.  Advised it would have to be scheduled after March 8

## 2020-01-12 NOTE — Telephone Encounter (Signed)
Routed message to Renae to schedule.

## 2020-01-13 ENCOUNTER — Telehealth: Payer: Medicare HMO

## 2020-01-13 ENCOUNTER — Other Ambulatory Visit: Payer: Self-pay

## 2020-01-15 ENCOUNTER — Ambulatory Visit: Payer: Medicare HMO

## 2020-01-15 ENCOUNTER — Other Ambulatory Visit: Payer: Self-pay

## 2020-01-15 DIAGNOSIS — J45909 Unspecified asthma, uncomplicated: Secondary | ICD-10-CM

## 2020-01-16 ENCOUNTER — Encounter: Payer: No Typology Code available for payment source | Admitting: Physical Medicine & Rehabilitation

## 2020-01-16 NOTE — Progress Notes (Signed)
  Chronic Care Management   Outreach Note  01/15/2020 Name: Maria Griffith MRN: [id number] DOB: 09/07/1961  Referred by: Kinnie [name], MD Reason for referral : Chronic Care Management (Asthma)   A second unsuccessful telephone outreach was attempted today. The patient was referred to the case management team for assistance with care management and care coordination.   Follow Up Plan: A HIPPA compliant phone message was left for the patient providing contact information and requesting a return call.  The care management team will reach out to the patient again over the next 5-7 days.   SIGNATURE Regina Eck, PharmD, BCPS Clinical Pharmacist, [place]: [phone]

## 2020-01-20 ENCOUNTER — Ambulatory Visit: Payer: Medicare HMO

## 2020-01-20 ENCOUNTER — Other Ambulatory Visit: Payer: Self-pay

## 2020-01-20 NOTE — Chronic Care Management (AMB) (Signed)
  Care Management   Outreach Note  01/20/2020 Name: Maria Griffith MRN: [id number] DOB: 01-18-1961  Referred by: Kinnie [name], MD Reason for referral : Care Coordination (Care Management RNCM HTN/Asthma)   A second unsuccessful telephone outreach was attempted today. The patient was referred to the case management team for assistance with care management and care coordination.   Follow Up Plan: A HIPPA compliant phone message was left for the patient providing contact information and requesting a return call.  The care management team will reach out to the patient again over the next 14 days.   Lazaro Arms RN, BSN, [facility] Care Management Coordinator [place] Phone: [phone] Fax: [phone]

## 2020-01-22 ENCOUNTER — Encounter: Payer: No Typology Code available for payment source | Attending: Registered Nurse | Admitting: Registered Nurse

## 2020-01-22 ENCOUNTER — Other Ambulatory Visit: Payer: Self-pay

## 2020-01-22 ENCOUNTER — Telehealth: Payer: Medicare HMO

## 2020-01-22 ENCOUNTER — Encounter: Payer: Self-pay | Admitting: Registered Nurse

## 2020-01-22 DIAGNOSIS — G894 Chronic pain syndrome: Secondary | ICD-10-CM | POA: Diagnosis present

## 2020-01-22 DIAGNOSIS — M961 Postlaminectomy syndrome, not elsewhere classified: Secondary | ICD-10-CM | POA: Diagnosis present

## 2020-01-22 DIAGNOSIS — G243 Spasmodic torticollis: Secondary | ICD-10-CM

## 2020-01-22 DIAGNOSIS — M5416 Radiculopathy, lumbar region: Secondary | ICD-10-CM | POA: Insufficient documentation

## 2020-01-22 DIAGNOSIS — M4722 Other spondylosis with radiculopathy, cervical region: Secondary | ICD-10-CM | POA: Diagnosis present

## 2020-01-22 DIAGNOSIS — Z79891 Long term (current) use of opiate analgesic: Secondary | ICD-10-CM | POA: Diagnosis present

## 2020-01-22 DIAGNOSIS — M5412 Radiculopathy, cervical region: Secondary | ICD-10-CM | POA: Diagnosis present

## 2020-01-22 DIAGNOSIS — M7918 Myalgia, other site: Secondary | ICD-10-CM | POA: Diagnosis present

## 2020-01-22 DIAGNOSIS — Z5181 Encounter for therapeutic drug level monitoring: Secondary | ICD-10-CM | POA: Diagnosis present

## 2020-01-22 DIAGNOSIS — M542 Cervicalgia: Secondary | ICD-10-CM | POA: Diagnosis present

## 2020-01-22 DIAGNOSIS — F5101 Primary insomnia: Secondary | ICD-10-CM

## 2020-01-22 MED ORDER — HYDROCODONE-ACETAMINOPHEN 7.5-325 MG PO TABS: ORAL_TABLET | ORAL | 0 refills | Status: DC
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
## 2020-01-22 NOTE — Telephone Encounter (Signed)
Last injection was Dysport 300 units....patient seen by Maria Griffith today.  Was asking if upcoming injection was going to be dysport again  Or will it be botox

## 2020-01-23 NOTE — Telephone Encounter (Signed)
Plan is for Dysport

## 2020-01-26 LAB — TOXASSURE SELECT,+ANTIDEPR,UR

## 2020-01-26 NOTE — Telephone Encounter (Signed)
Attempted to call patient with current information, no answer, left voicemail to return call.

## 2020-01-30 ENCOUNTER — Telehealth: Payer: Self-pay

## 2020-01-30 NOTE — Telephone Encounter (Signed)
UDS RESULTS CONSISTENT WITH MEDICATIONS ON FILE  

## 2020-02-03 ENCOUNTER — Ambulatory Visit: Payer: Self-pay | Admitting: Pharmacist

## 2020-02-03 DIAGNOSIS — J45909 Unspecified asthma, uncomplicated: Secondary | ICD-10-CM

## 2020-02-03 NOTE — Progress Notes (Signed)
Chronic Care Management   Visit Note  02/03/2020 Name: Maria Griffith MRN: [id number] DOB: 09/08/1961  Referred by: Kinnie [name], MD Reason for referral : Chronic Care Management   Maria Griffith is a 59 y.o. year old female who is a primary care patient of Kinnie [name], MD. The CCM team was consulted for assistance with chronic disease management and care coordination needs related to asthma  Review of patient status, including review of consultants reports, relevant laboratory and other test results, and collaboration with appropriate care team members and the patient's provider was performed as part of comprehensive patient evaluation and provision of chronic care management services.    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 See Care Plan for related entries.   Medications: Outpatient Encounter Medications as of 02/03/2020  Medication Sig  . albuterol (PROVENTIL) (2.5 MG/3ML) 0.083% nebulizer solution INHALE THE CONTENTS OF 1 VIAL VIA NEBULIZER EVERY 6 HOURS AS NEEDED FOR WHEEZING OR SHORTNESS OF BREATH  . albuterol (VENTOLIN HFA) 108 (90 Base) MCG/ACT inhaler INHALE 2 PUFFS into THE lungs EVERY 6 HOURS AS NEEDED FOR WHEEZING  . benzonatate (TESSALON) 100 MG capsule TAKE 1 CAPSULE BY MOUTH 3 TIMES DAILY AS NEEDED FOR COUGH  . diclofenac Sodium (VOLTAREN) 1 % GEL Apply 2 g topically 4 (four) times daily.  . Fluticasone-Salmeterol (ADVAIR DISKUS) 250-50 MCG/DOSE AEPB Inhale 1 puff into the lungs 2 (two) times daily.  . hydrALAZINE (APRESOLINE) 50 MG tablet Take 1 tablet (50 mg total) by mouth 3 (three) times daily.  Marland Kitchen HYDROcodone-acetaminophen (NORCO) 7.5-325 MG tablet TAKE 1 TABLET BY MOUTH EVERY 6 HOURS AS NEEDED FOR MODERATE PAIN  . loratadine (CLARITIN) 10 MG tablet TAKE 1 TABLET BY MOUTH EVERY DAY AS NEEDED FOR allergies  . losartan (COZAAR) 100 MG tablet Take 1 tablet (100 mg total) by mouth daily.  .  metroNIDAZOLE (FLAGYL) 500 MG tablet Take 1 tablet (500 mg total) by mouth 2 (two) times daily.  Marland Kitchen oxybutynin (DITROPAN) 5 MG tablet Take 1 tablet (5 mg total) by mouth 2 (two) times daily.  . polyethylene glycol powder (GAVILAX) 17 GM/SCOOP powder Mix 17 grams IN EIGHT ounces OF liquid AND drink EVERY DAY AS DIRECTED  . pregabalin (LYRICA) 100 MG capsule Take 1 capsule (100 mg total) by mouth 3 (three) times daily.  Marland Kitchen senna-docusate (GNP STOOL SOFTENER/LAXATIVE) 8.6-50 MG tablet Take 1 tablet by mouth at bedtime.  Marland Kitchen tiZANidine (ZANAFLEX) 4 MG tablet TAKE 1 TABLET BY MOUTH 3 TIMES DAILY  . traZODone (DESYREL) 150 MG tablet Take 1 tablet (150 mg total) by mouth at bedtime.  . Vitamin D, Ergocalciferol, (DRISDOL) 1.25 MG (50000 UT) CAPS capsule Take 1 capsule (50,000 Units total) by mouth every 7 (seven) days.  . [DISCONTINUED] benzonatate (TESSALON) 100 MG capsule TAKE 1 CAPSULE BY MOUTH 3 TIMES DAILY AS NEEDED FOR COUGH   No facility-administered encounter medications on file as of 02/03/2020.     Objective:   Goals Addressed            This Visit's Progress     Patient Stated   . I would like my asthma to better controlled (pt-stated)       Current Barriers:  . Asthma; complex patient with multiple comorbidities including asthma, HTN . Patient does not like to take a lot of medications . COVID 19 positive on 12/15/19, slowly recovering  Pharmacist Clinical Goal(s):  Marland Kitchen Over the next 90 days, patient will  work with PharmD and provider towards optimized medication management of asthma/chronic conditions  Interventions: . Encouraged patient to go to the ED if worsening SOB/wheezing . Encouraged compliance with BreoEllipta and albuterol rescue . CCM PharmD Clinic visit scheduled for 02/10/20 at 12pm  02/03/20 . Patient states that she has been able to hold down food since yesterday . She is breathing better and using her nebulizer and albuterol inhaler . She states that she does not  have the Adviar Diskus 250-50 due to cost o Will attempt to apply for Merck (Proventil) and astra zeneca (symbicort) inhalers.  Goal is to obtain ICS,laba therapy . Collaborated with Lottie Debellis Pharm D and Nicholas Lose to get patient samples o Medication Samples have been provided to the patient Lourena Simmonds) o Drug name: Adair Patter     Strength: 200/25   mcg     Qty: 2  LOT: [id number]  Exp.Date: [facility] 2021 o Dosing instructions: 1 inhalation daily o The patient has been instructed regarding the correct time, dose, and frequency of taking this medication, including desired effects and most common side effects.  o Patient has been notified Daughter will pick up her medication.  Daughter will be given instruction on how to use.  o Daughter picked up samples at 315 pm. Given instruction about inhaler and told about paperwork for med assistance coming tin her mother.   . Continue current management: o Albuterol PRN (inhaler) o BREOellipta QD o Tessalon PRN o Nebulized albuterol as needed  Patient Self Care Activities:  . Patient will take medications as prescribed . Patient will focus on improved adherence by continuing to use inhalers as prescribed.  Avoiding triggers--cold, stay at home safe practices           Plan:   The care management team will reach out to the patient again over the next 7 days.   Provider Signature   Regina Eck, PharmD, BCPS Clinical Pharmacist, [place]: [phone]

## 2020-02-10 ENCOUNTER — Other Ambulatory Visit: Payer: Self-pay | Admitting: Family Medicine

## 2020-02-10 ENCOUNTER — Other Ambulatory Visit: Payer: Self-pay

## 2020-02-10 ENCOUNTER — Ambulatory Visit: Payer: Medicare Other

## 2020-02-10 ENCOUNTER — Other Ambulatory Visit: Payer: Self-pay | Admitting: Physical Medicine & Rehabilitation

## 2020-02-10 DIAGNOSIS — J45909 Unspecified asthma, uncomplicated: Secondary | ICD-10-CM

## 2020-02-19 NOTE — Progress Notes (Signed)
Chronic Care Management   Visit Note  02/10/2020 Name: Maria Griffith MRN: [id number] DOB: 1961-06-06  Referred by: Kinnie [name], MD Reason for referral : Chronic Care Management (Asthma)   Maria Griffith is a 59 y.o. year old female who is a primary care patient of Kinnie [name], MD. The CCM team was consulted for assistance with chronic disease management and care coordination needs related to asthma  Review of patient status, including review of consultants reports, relevant laboratory and other test results, and collaboration with appropriate care team members and the patient's provider was performed as part of comprehensive patient evaluation and provision of chronic care management services.    SDOH (Social Determinants of Health) assessments performed: No See Care Plan activities for detailed interventions related to SDOH)     Medications: Outpatient Encounter Medications as of 02/10/2020  Medication Sig  . budesonide-formoterol (SYMBICORT) 160-4.5 MCG/ACT inhaler Inhale 2 puffs into the lungs 2 (two) times daily.  Marland Kitchen albuterol (PROVENTIL) (2.5 MG/3ML) 0.083% nebulizer solution INHALE THE CONTENTS OF 1 VIAL VIA NEBULIZER EVERY 6 HOURS AS NEEDED FOR WHEEZING OR SHORTNESS OF BREATH  . albuterol (VENTOLIN HFA) 108 (90 Base) MCG/ACT inhaler INHALE 2 PUFFS into THE lungs EVERY 6 HOURS AS NEEDED FOR WHEEZING  . benzonatate (TESSALON) 100 MG capsule TAKE 1 CAPSULE BY MOUTH 3 TIMES DAILY AS NEEDED FOR COUGH  . diclofenac Sodium (VOLTAREN) 1 % GEL Apply 2 g topically 4 (four) times daily.  . hydrALAZINE (APRESOLINE) 50 MG tablet Take 1 tablet (50 mg total) by mouth 3 (three) times daily.  Marland Kitchen HYDROcodone-acetaminophen (NORCO) 7.5-325 MG tablet TAKE 1 TABLET BY MOUTH EVERY 6 HOURS AS NEEDED FOR MODERATE PAIN  . loratadine (CLARITIN) 10 MG tablet TAKE 1 TABLET BY MOUTH EVERY DAY AS NEEDED FOR allergies  . losartan (COZAAR) 100 MG tablet Take 1 tablet (100 mg total) by mouth daily.  .  metroNIDAZOLE (FLAGYL) 500 MG tablet Take 1 tablet (500 mg total) by mouth 2 (two) times daily.  Marland Kitchen oxybutynin (DITROPAN) 5 MG tablet Take 1 tablet (5 mg total) by mouth 2 (two) times daily.  . polyethylene glycol powder (GAVILAX) 17 GM/SCOOP powder Mix 17 grams IN EIGHT ounces OF liquid AND drink EVERY DAY AS DIRECTED  . pregabalin (LYRICA) 100 MG capsule Take 1 capsule (100 mg total) by mouth 3 (three) times daily.  Marland Kitchen senna-docusate (GNP STOOL SOFTENER/LAXATIVE) 8.6-50 MG tablet Take 1 tablet by mouth at bedtime.  Marland Kitchen tiZANidine (ZANAFLEX) 4 MG tablet TAKE 1 TABLET BY MOUTH 3 TIMES DAILY  . traZODone (DESYREL) 150 MG tablet TAKE 1 TABLET BY MOUTH AT BEDTIME  . [DISCONTINUED] benzonatate (TESSALON) 100 MG capsule TAKE 1 CAPSULE BY MOUTH 3 TIMES DAILY AS NEEDED FOR COUGH  . [DISCONTINUED] Fluticasone-Salmeterol (ADVAIR DISKUS) 250-50 MCG/DOSE AEPB Inhale 1 puff into the lungs 2 (two) times daily.  . [DISCONTINUED] Vitamin D, Ergocalciferol, (DRISDOL) 1.25 MG (50000 UT) CAPS capsule Take 1 capsule (50,000 Units total) by mouth every 7 (seven) days.   No facility-administered encounter medications on file as of 02/10/2020.     Objective:   Goals Addressed            This Visit's Progress     Patient Stated   . I would like my asthma to better controlled (pt-stated)       Current Barriers:  . Asthma; complex patient with multiple comorbidities including asthma, HTN . Patient does not like to take a lot of medications . COVID  19 positive on 12/15/19, slowly recovering  Pharmacist Clinical Goal(s):  Marland Kitchen Over the next 90 days, patient will work with PharmD and provider towards optimized medication management of asthma/chronic conditions  Interventions: . Encouraged patient to go to the ED if worsening SOB/wheezing . Encouraged compliance with BreoEllipta and albuterol rescue . CCM PharmD Clinic visit completed on 02/10/20   . Patient states that she has been able to hold down food since  yesterday . She is breathing better and using her nebulizer and albuterol inhaler . She states that she does not have the Adviar Diskus 250-50 due to cost o Application submitted for Merck and astra zeneca (symbicort) inhalers.  Goal is to obtain ICS, laba therapy for asthma patient . Patient sample given o Medication Samples have been provided to the patient Lourena Simmonds) o Drug name: Breo  Ellipta     Strength: 100/25   mcg     Qty: 1  LOT: [id number]  Exp.Date: 03/17/2020 o Dosing instructions: 1 inhalation daily o The patient has been instructed regarding the correct time, dose, and frequency of taking this medication, including desired effects and most common side effects.    . Continue current management: o Albuterol PRN (inhaler) o BREOellipta QD o Tessalon PRN o Nebulized albuterol as needed  Patient Self Care Activities:  . Patient will take medications as prescribed . Patient will focus on improved adherence by continuing to use inhalers as prescribed.  Avoiding triggers--cold, stay at home safe practices          Provider Signature Regina Eck, PharmD, BCPS Clinical Pharmacist, [place]: [phone]

## 2020-02-24 ENCOUNTER — Encounter: Payer: Self-pay | Admitting: Physical Medicine & Rehabilitation

## 2020-02-24 ENCOUNTER — Other Ambulatory Visit: Payer: Self-pay

## 2020-02-24 ENCOUNTER — Encounter
Payer: No Typology Code available for payment source | Attending: Physical Medicine & Rehabilitation | Admitting: Physical Medicine & Rehabilitation

## 2020-02-24 ENCOUNTER — Other Ambulatory Visit: Payer: Self-pay | Admitting: Physical Medicine & Rehabilitation

## 2020-02-24 VITALS — BP 185/102 | HR 75 | Temp 98.9°F | Ht 61.0 in | Wt 170.0 lb

## 2020-02-24 DIAGNOSIS — M436 Torticollis: Secondary | ICD-10-CM

## 2020-02-24 MED ORDER — HYDROCODONE-ACETAMINOPHEN 7.5-325 MG PO TABS: ORAL_TABLET | ORAL | 0 refills | Status: DC

## 2020-02-24 NOTE — Patient Instructions (Signed)

## 2020-02-24 NOTE — Progress Notes (Signed)
Please contact this patient to help schedule follow-up for BP management. Thanks.

## 2020-02-24 NOTE — Progress Notes (Signed)
   Procedure: Dysport injection Diagnosis: Cervical dystonia G 24.3 Dilution: 300 units in .6 mL sterile preservative-free normal saline  Informed consent was obtained after describing risks and benefits of the procedure with patient including bleeding bruising infection as well as the potential side effects of the medication itself. Patient elects to proceed and has given written consent.  Patient placed in a seated position Areas were marked and prepped with Betadine  Needle: 27-gauge 1 inch needle electrode connected to EMG amplifier    Left longissimus: 100 units Left splenius capitis: 150 units Left Levator scapula: 50  All injections done after negative drawback for blood. Appropriate EMG activity.   Nurse practitioner visit in 1 month to refill narcotic analgesic medication and check for compliance. PDMP reviewed appropriate

## 2020-02-25 ENCOUNTER — Other Ambulatory Visit: Payer: Self-pay | Admitting: *Deleted

## 2020-02-25 ENCOUNTER — Telehealth: Payer: Self-pay | Admitting: Family Medicine

## 2020-02-25 MED ORDER — HYDRALAZINE HCL 50 MG PO TABS: 50.0000 mg | ORAL_TABLET | Freq: Three times a day (TID) | ORAL | 0 refills | Status: DC

## 2020-02-25 MED ORDER — TRAZODONE HCL 150 MG PO TABS: 150.0000 mg | ORAL_TABLET | Freq: Every day | ORAL | 1 refills | Status: DC

## 2020-02-25 MED ORDER — LORATADINE 10 MG PO TABS: 10.0000 mg | ORAL_TABLET | Freq: Every day | ORAL | 1 refills | Status: DC

## 2020-02-25 NOTE — Telephone Encounter (Signed)
Patient has an appt on 03-05-2020 to discuss leg cramps.  I added bp to note line to discuss.  Jonavin Seder,CMA

## 2020-02-25 NOTE — Telephone Encounter (Signed)
It seems this patient HTN med refill is done mostly by her cardiologist and no recent f/u with me. I have refilled her Hydralazine with no additional refills.  Advise her to see me soon for BP management or if she prefers, to f/u with her cardiologist for evaluation and medication management.  Having two providers refill her meds can lead to confusion and is unsafe.

## 2020-03-03 ENCOUNTER — Ambulatory Visit: Payer: Self-pay | Admitting: Pharmacist

## 2020-03-03 ENCOUNTER — Other Ambulatory Visit: Payer: Self-pay | Admitting: Pharmacy Technician

## 2020-03-03 DIAGNOSIS — J45909 Unspecified asthma, uncomplicated: Secondary | ICD-10-CM

## 2020-03-03 NOTE — Patient Outreach (Signed)
[facility]) Care Management  03/03/2020  TAVI DOREN 05/25/1961 [id number]   Received patient and provider portion(s) of patient assistance application(s) for Symbicort. Faxed completed application and required documents into [place].  Will follow up with company(ies) in 7-10 business days to check status of application(s).  Maud Deed Chana Bode [place] Certified Pharmacy Technician [place] Management Direct [phone]

## 2020-03-05 ENCOUNTER — Ambulatory Visit (INDEPENDENT_AMBULATORY_CARE_PROVIDER_SITE_OTHER): Payer: Medicare Other | Admitting: Family Medicine

## 2020-03-05 ENCOUNTER — Encounter: Payer: Self-pay | Admitting: Family Medicine

## 2020-03-05 ENCOUNTER — Other Ambulatory Visit: Payer: Self-pay

## 2020-03-05 ENCOUNTER — Ambulatory Visit: Payer: Medicare HMO | Admitting: Family Medicine

## 2020-03-05 VITALS — BP 128/62 | HR 103 | Ht 61.0 in | Wt 177.8 lb

## 2020-03-05 DIAGNOSIS — I77819 Aortic ectasia, unspecified site: Secondary | ICD-10-CM

## 2020-03-05 DIAGNOSIS — I10 Essential (primary) hypertension: Secondary | ICD-10-CM

## 2020-03-05 DIAGNOSIS — Z23 Encounter for immunization: Secondary | ICD-10-CM | POA: Diagnosis not present

## 2020-03-05 DIAGNOSIS — R202 Paresthesia of skin: Secondary | ICD-10-CM

## 2020-03-05 DIAGNOSIS — R252 Cramp and spasm: Secondary | ICD-10-CM | POA: Diagnosis not present

## 2020-03-05 DIAGNOSIS — E559 Vitamin D deficiency, unspecified: Secondary | ICD-10-CM

## 2020-03-05 DIAGNOSIS — J4541 Moderate persistent asthma with (acute) exacerbation: Secondary | ICD-10-CM

## 2020-03-05 NOTE — Assessment & Plan Note (Signed)
BP improved. Continue current regimen.

## 2020-03-05 NOTE — Progress Notes (Signed)
    SUBJECTIVE:   CHIEF COMPLAINT / HPI:  Muscle cramps: C/O B/L LL cramping pain which is worse at night. She denies any known trigger. Pain is associated with tingling and numbness of her right lateral thigh. No limb weakness. She uses heating pad to relieve her pain. Symptoms has been ongoing for year, however, it worsened in the last few months. Her tingling sensation started about 1 month ago. HTN: She is compliant with her meds. No concerns. Asthma:Compliant with her Symbicort. No SOB. PAP due: here for f/u.  PERTINENT  PMH / PSH: PMX reviewed  OBJECTIVE:   BP 128/62   Pulse (!) 103   Ht 5\' 1"  (1.549 m)   Wt 177 lb 12.8 oz (80.6 kg)   LMP 10/22/2014 (Approximate)   SpO2 99%   BMI 33.60 kg/m   Vitals:   03/05/20 1001  BP: 128/62  Pulse: (!) 103  SpO2: 99%  Weight: 177 lb 12.8 oz (80.6 kg)  Height: 5\' 1"  (1.549 m)   Physical Exam Vitals reviewed.  Cardiovascular:     Rate and Rhythm: Normal rate and regular rhythm.     Heart sounds: Normal heart sounds. No murmur.  Pulmonary:     Effort: Pulmonary effort is normal. No respiratory distress.     Breath sounds: Normal breath sounds. No wheezing or rhonchi.  Abdominal:     General: Bowel sounds are normal. There is no distension.     Palpations: Abdomen is soft. There is no mass.  Musculoskeletal:     Right lower leg: No edema.     Left lower leg: No edema.     Comments: Reduced lumbar ROM due to pain (Chronic). Mild tenderness with deep palpation of her thigh muscles B/L. ?? Reduced sensation on her right lateral thigh. No swelling, no erythema.  Neurological:     General: No focal deficit present.     Cranial Nerves: Cranial nerves are intact.     Coordination: Coordination is intact.     Comments: Slow ambulation with a walking cane (not new)      ASSESSMENT/PLAN:   Muscle cramping Chronic recurrent. Check electrolytes and muscle enzymes. Concern for paresthesia of her right thigh. Lateral Meralgia  Paresthetica. NSC/EMG recommended. She agreed with neuro referral for this. Continue Pregabalin at current dose.  Essential hypertension BP improved. Continue current regimen.  Asthma, moderate persistent Stable on current regimen.   Flu shot given.  Advised to reschedule for PAP. She came pretty late >15 min today. Feedback provided.  Andrena Mews, MD [place]

## 2020-03-05 NOTE — Assessment & Plan Note (Signed)
Stable on current regimen   

## 2020-03-05 NOTE — Progress Notes (Signed)
Patient ID: Maria Griffith, female   DOB: August 26, 1961, 59 y.o.   MRN: [id number] She stated that she has switched to [place]. CMA will update

## 2020-03-05 NOTE — Patient Instructions (Signed)
It was nice seeing you today. We will need you to schedule PAP in the next few weeks.

## 2020-03-05 NOTE — Assessment & Plan Note (Signed)
Chronic recurrent. Check electrolytes and muscle enzymes. Concern for paresthesia of her right thigh. Lateral Meralgia Paresthetica. NSC/EMG recommended. She agreed with neuro referral for this. Continue Pregabalin at current dose.

## 2020-03-06 ENCOUNTER — Telehealth: Payer: Self-pay | Admitting: Family Medicine

## 2020-03-06 LAB — MYOGLOBIN, SERUM: Myoglobin: 61 ng/mL — ABNORMAL HIGH (ref 25–58)

## 2020-03-06 LAB — BASIC METABOLIC PANEL
BUN/Creatinine Ratio: 19 (ref 9–23)
BUN: 20 mg/dL (ref 6–24)
CO2: 26 mmol/L (ref 20–29)
Calcium: 9.4 mg/dL (ref 8.7–10.2)
Chloride: 105 mmol/L (ref 96–106)
Creatinine, Ser: 1.04 mg/dL — ABNORMAL HIGH (ref 0.57–1.00)
GFR calc Af Amer: 69 mL/min/{1.73_m2} (ref >59)
GFR calc non Af Amer: 60 mL/min/{1.73_m2} (ref >59)
Glucose: 70 mg/dL (ref 65–99)
Potassium: 4.4 mmol/L (ref 3.5–5.2)
Sodium: 141 mmol/L (ref 134–144)

## 2020-03-06 LAB — CK: Total CK: 230 U/L — ABNORMAL HIGH (ref 32–182)

## 2020-03-06 LAB — MAGNESIUM: Magnesium: 2 mg/dL (ref 1.6–2.3)

## 2020-03-06 LAB — VITAMIN D 25 HYDROXY (VIT D DEFICIENCY, FRACTURES): Vit D, 25-Hydroxy: 24.7 ng/mL — ABNORMAL LOW (ref 30.0–100.0)

## 2020-03-06 NOTE — Telephone Encounter (Signed)
Test discussed with the patient.  Low vitamin D: Will refill her supplement. Mild AKI: Hydration recommended. Very mild elevation of CK/Myo. Improved from last checked. Could be due to Lyrica vs dehydration. Hydration recommended.  I advised her to continue Zanaflex prn muscle spasm which helps some. May consider increasing dose to 6 mg in the future. She agreed with the plan. F/U with neurologist for nerve stimulation testing.

## 2020-03-08 ENCOUNTER — Other Ambulatory Visit: Payer: Self-pay

## 2020-03-08 MED ORDER — DICLOFENAC SODIUM 1 % EX GEL: CUTANEOUS | 2 refills | Status: DC

## 2020-03-08 MED ORDER — ALBUTEROL SULFATE (2.5 MG/3ML) 0.083% IN NEBU: INHALATION_SOLUTION | RESPIRATORY_TRACT | 5 refills | Status: DC

## 2020-03-08 MED ORDER — BUDESONIDE-FORMOTEROL FUMARATE 160-4.5 MCG/ACT IN AERO: 2.0000 | INHALATION_SPRAY | Freq: Two times a day (BID) | RESPIRATORY_TRACT | 3 refills | Status: DC

## 2020-03-08 MED ORDER — OXYBUTYNIN CHLORIDE 5 MG PO TABS: 5.0000 mg | ORAL_TABLET | Freq: Two times a day (BID) | ORAL | 2 refills | Status: DC

## 2020-03-08 MED ORDER — ALBUTEROL SULFATE HFA 108 (90 BASE) MCG/ACT IN AERS: INHALATION_SPRAY | RESPIRATORY_TRACT | 3 refills | Status: DC

## 2020-03-08 MED ORDER — LOSARTAN POTASSIUM 100 MG PO TABS: 100.0000 mg | ORAL_TABLET | Freq: Every day | ORAL | 2 refills | Status: DC

## 2020-03-08 NOTE — Progress Notes (Signed)
Chronic Care Management   Visit Note  03/03/2020 Name: Maria Griffith MRN: [id number] DOB: 1961/08/23  Referred by: Kinnie [name], MD Reason for referral : Asthma   Maria Griffith is a 59 y.o. year old female who is a primary care patient of Kinnie [name], MD. The CCM team was consulted for assistance with chronic disease management and care coordination needs related to asthma  Review of patient status, including review of consultants reports, relevant laboratory and other test results, and collaboration with appropriate care team members and the patient's provider was performed as part of comprehensive patient evaluation and provision of chronic care management services.    SDOH (Social Determinants of Health) assessments performed: No See Care Plan activities for detailed interventions related to SDOH     Medications: Outpatient Encounter Medications as of 03/03/2020  Medication Sig  . albuterol (PROVENTIL) (2.5 MG/3ML) 0.083% nebulizer solution INHALE THE CONTENTS OF 1 VIAL VIA NEBULIZER EVERY 6 HOURS AS NEEDED FOR WHEEZING OR SHORTNESS OF BREATH (Patient not taking: Reported on 03/05/2020)  . albuterol (VENTOLIN HFA) 108 (90 Base) MCG/ACT inhaler INHALE 2 PUFFS into THE lungs EVERY 6 HOURS AS NEEDED FOR WHEEZING (Patient not taking: Reported on 03/05/2020)  . benzonatate (TESSALON) 100 MG capsule TAKE 1 CAPSULE BY MOUTH 3 TIMES DAILY AS NEEDED FOR COUGH (Patient not taking: Reported on 03/05/2020)  . budesonide-formoterol (SYMBICORT) 160-4.5 MCG/ACT inhaler Inhale 2 puffs into the lungs 2 (two) times daily.  . diclofenac Sodium (VOLTAREN) 1 % GEL APPLY 2 grams topically 4 TIMES DAILY  . hydrALAZINE (APRESOLINE) 50 MG tablet Take 1 tablet (50 mg total) by mouth 3 (three) times daily.  Marland Kitchen HYDROcodone-acetaminophen (NORCO) 7.5-325 MG tablet TAKE 1 TABLET BY MOUTH EVERY 6 HOURS AS NEEDED FOR MODERATE PAIN  . loratadine (CLARITIN) 10 MG tablet Take 1 tablet (10 mg total) by mouth  daily.  Marland Kitchen losartan (COZAAR) 100 MG tablet Take 1 tablet (100 mg total) by mouth daily.  Marland Kitchen oxybutynin (DITROPAN) 5 MG tablet Take 1 tablet (5 mg total) by mouth 2 (two) times daily.  . polyethylene glycol powder (GAVILAX) 17 GM/SCOOP powder Mix 17 grams IN EIGHT ounces OF liquid AND drink EVERY DAY AS DIRECTED (Patient not taking: Reported on 03/05/2020)  . pregabalin (LYRICA) 100 MG capsule Take 1 capsule (100 mg total) by mouth 3 (three) times daily.  Marland Kitchen senna-docusate (GNP STOOL SOFTENER/LAXATIVE) 8.6-50 MG tablet Take 1 tablet by mouth at bedtime.  Marland Kitchen tiZANidine (ZANAFLEX) 4 MG tablet TAKE 1 TABLET BY MOUTH 3 TIMES DAILY  . traZODone (DESYREL) 150 MG tablet Take 1 tablet (150 mg total) by mouth at bedtime.  . Vitamin D, Ergocalciferol, (DRISDOL) 1.25 MG (50000 UNIT) CAPS capsule TAKE 1 CAPSULE BY MOUTH EVERY 7 DAYS (Patient not taking: Reported on 03/05/2020)  . [DISCONTINUED] benzonatate (TESSALON) 100 MG capsule TAKE 1 CAPSULE BY MOUTH 3 TIMES DAILY AS NEEDED FOR COUGH  . [DISCONTINUED] metroNIDAZOLE (FLAGYL) 500 MG tablet Take 1 tablet (500 mg total) by mouth 2 (two) times daily.   No facility-administered encounter medications on file as of 03/03/2020.     Objective:   Goals Addressed            This Visit's Progress     Patient Stated   . I would like my asthma to better controlled (pt-stated)       Current Barriers:  . Asthma; complex patient with multiple comorbidities including asthma, HTN . Patient does not like to take a lot  of medications . COVID 19 positive on 12/15/19, slowly recovering  Pharmacist Clinical Goal(s):  Marland Kitchen Over the next 90 days, patient will work with PharmD and provider towards optimized medication management of asthma/chronic conditions  Interventions: . Encouraged patient to go to the ED if worsening SOB/wheezing . Encouraged compliance with BreoEllipta and albuterol rescue . Applications mailed and faxed for Symbicort and Proventil (via [employer]) on  03/03/20.  [facility] CPhT, Etter Sjogren following  . Patient states that she has been able to hold down food since yesterday . She is breathing better and using her nebulizer and albuterol inhaler . She states that she does not have the Adviar Diskus 250-50 due to cost o Application submitted for [employer] and astra zeneca (symbicort) inhalers.  Goal is to obtain ICS, laba therapy for asthma patient . Patient sample given on 02/10/20 o Medication Samples have been provided to the patient Lourena Simmonds) o Drug name: Adair Patter     Strength: 100/25   mcg     Qty: 1  LOT: [id number]  Exp.Date: 03/17/2020 o Dosing instructions: 1 inhalation daily o The patient has been instructed regarding the correct time, dose, and frequency of taking this medication, including desired effects and most common side effects.    . Continue current management: o Albuterol PRN (inhaler) o BREOellipta QD o Tessalon PRN o Nebulized albuterol as needed  Patient Self Care Activities:  . Patient will take medications as prescribed . Patient will focus on improved adherence by continuing to use inhalers as prescribed.  Avoiding triggers--cold, stay at home safe practices          Provider Signature Regina Eck, PharmD, BCPS Clinical Pharmacist, [place]: [phone]

## 2020-03-23 ENCOUNTER — Encounter: Payer: No Typology Code available for payment source | Attending: Registered Nurse | Admitting: Registered Nurse

## 2020-03-23 ENCOUNTER — Other Ambulatory Visit: Payer: Self-pay

## 2020-03-23 ENCOUNTER — Ambulatory Visit: Payer: Medicare Other | Admitting: Family Medicine

## 2020-03-23 ENCOUNTER — Encounter: Payer: Self-pay | Admitting: Registered Nurse

## 2020-03-23 VITALS — BP 179/99 | HR 78 | Temp 97.7°F | Ht 61.0 in | Wt 178.3 lb

## 2020-03-23 DIAGNOSIS — G5602 Carpal tunnel syndrome, left upper limb: Secondary | ICD-10-CM | POA: Insufficient documentation

## 2020-03-23 DIAGNOSIS — M436 Torticollis: Secondary | ICD-10-CM | POA: Insufficient documentation

## 2020-03-23 DIAGNOSIS — M4722 Other spondylosis with radiculopathy, cervical region: Secondary | ICD-10-CM | POA: Insufficient documentation

## 2020-03-23 DIAGNOSIS — F419 Anxiety disorder, unspecified: Secondary | ICD-10-CM | POA: Insufficient documentation

## 2020-03-23 DIAGNOSIS — M542 Cervicalgia: Secondary | ICD-10-CM | POA: Diagnosis present

## 2020-03-23 DIAGNOSIS — G894 Chronic pain syndrome: Secondary | ICD-10-CM

## 2020-03-23 DIAGNOSIS — J45909 Unspecified asthma, uncomplicated: Secondary | ICD-10-CM | POA: Insufficient documentation

## 2020-03-23 DIAGNOSIS — G8928 Other chronic postprocedural pain: Secondary | ICD-10-CM | POA: Insufficient documentation

## 2020-03-23 DIAGNOSIS — M5412 Radiculopathy, cervical region: Secondary | ICD-10-CM | POA: Diagnosis present

## 2020-03-23 DIAGNOSIS — M7918 Myalgia, other site: Secondary | ICD-10-CM | POA: Diagnosis present

## 2020-03-23 DIAGNOSIS — Y92009 Unspecified place in unspecified non-institutional (private) residence as the place of occurrence of the external cause: Secondary | ICD-10-CM | POA: Diagnosis present

## 2020-03-23 DIAGNOSIS — F5101 Primary insomnia: Secondary | ICD-10-CM

## 2020-03-23 DIAGNOSIS — W19XXXA Unspecified fall, initial encounter: Secondary | ICD-10-CM | POA: Diagnosis present

## 2020-03-23 DIAGNOSIS — M961 Postlaminectomy syndrome, not elsewhere classified: Secondary | ICD-10-CM | POA: Diagnosis present

## 2020-03-23 DIAGNOSIS — Z79891 Long term (current) use of opiate analgesic: Secondary | ICD-10-CM | POA: Diagnosis present

## 2020-03-23 DIAGNOSIS — M62838 Other muscle spasm: Secondary | ICD-10-CM | POA: Insufficient documentation

## 2020-03-23 DIAGNOSIS — K5900 Constipation, unspecified: Secondary | ICD-10-CM | POA: Insufficient documentation

## 2020-03-23 DIAGNOSIS — Z5181 Encounter for therapeutic drug level monitoring: Secondary | ICD-10-CM

## 2020-03-23 DIAGNOSIS — M5416 Radiculopathy, lumbar region: Secondary | ICD-10-CM | POA: Diagnosis present

## 2020-03-23 DIAGNOSIS — G4700 Insomnia, unspecified: Secondary | ICD-10-CM | POA: Insufficient documentation

## 2020-03-23 DIAGNOSIS — Z8249 Family history of ischemic heart disease and other diseases of the circulatory system: Secondary | ICD-10-CM | POA: Insufficient documentation

## 2020-03-23 DIAGNOSIS — F329 Major depressive disorder, single episode, unspecified: Secondary | ICD-10-CM | POA: Insufficient documentation

## 2020-03-23 MED ORDER — PREGABALIN 100 MG PO CAPS: 100.0000 mg | ORAL_CAPSULE | Freq: Three times a day (TID) | ORAL | 3 refills | Status: DC

## 2020-03-23 MED ORDER — HYDROCODONE-ACETAMINOPHEN 7.5-325 MG PO TABS: ORAL_TABLET | ORAL | 0 refills | Status: DC

## 2020-03-23 MED ORDER — TIZANIDINE HCL 4 MG PO TABS: ORAL_TABLET | ORAL | 2 refills | Status: DC
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
## 2020-03-23 NOTE — Patient Instructions (Signed)
I Fax Mr. [name]: 01/23/2020

## 2020-04-01 ENCOUNTER — Encounter: Payer: Self-pay | Admitting: Family Medicine

## 2020-04-02 ENCOUNTER — Other Ambulatory Visit: Payer: Self-pay

## 2020-04-02 ENCOUNTER — Telehealth: Payer: Self-pay

## 2020-04-02 ENCOUNTER — Encounter: Payer: Self-pay | Admitting: Family Medicine

## 2020-04-02 ENCOUNTER — Other Ambulatory Visit (HOSPITAL_COMMUNITY)
Admission: RE | Admit: 2020-04-02 | Discharge: 2020-04-02 | Disposition: A | Discharge status: Home / Self Care | Payer: Medicare Other | Source: Ambulatory Visit | Attending: Family Medicine | Admitting: Family Medicine

## 2020-04-02 ENCOUNTER — Ambulatory Visit (INDEPENDENT_AMBULATORY_CARE_PROVIDER_SITE_OTHER): Payer: Medicare Other | Admitting: Family Medicine

## 2020-04-02 VITALS — BP 140/72 | HR 100 | Ht 61.0 in | Wt 180.1 lb

## 2020-04-02 DIAGNOSIS — Z124 Encounter for screening for malignant neoplasm of cervix: Secondary | ICD-10-CM

## 2020-04-02 DIAGNOSIS — I110 Hypertensive heart disease with heart failure: Secondary | ICD-10-CM | POA: Diagnosis not present

## 2020-04-02 DIAGNOSIS — R87611 Atypical squamous cells cannot exclude high grade squamous intraepithelial lesion on cytologic smear of cervix (ASC-H): Secondary | ICD-10-CM | POA: Insufficient documentation

## 2020-04-02 DIAGNOSIS — D709 Neutropenia, unspecified: Secondary | ICD-10-CM | POA: Diagnosis not present

## 2020-04-02 DIAGNOSIS — Z1151 Encounter for screening for human papillomavirus (HPV): Secondary | ICD-10-CM | POA: Insufficient documentation

## 2020-04-02 DIAGNOSIS — N898 Other specified noninflammatory disorders of vagina: Secondary | ICD-10-CM | POA: Diagnosis present

## 2020-04-02 DIAGNOSIS — Z780 Asymptomatic menopausal state: Secondary | ICD-10-CM | POA: Insufficient documentation

## 2020-04-02 DIAGNOSIS — F331 Major depressive disorder, recurrent, moderate: Secondary | ICD-10-CM

## 2020-04-02 DIAGNOSIS — R202 Paresthesia of skin: Secondary | ICD-10-CM | POA: Insufficient documentation

## 2020-04-02 LAB — POCT WET PREP (WET MOUNT)
Clue Cells Wet Prep Whiff POC: NEGATIVE
Trichomonas Wet Prep HPF POC: ABSENT

## 2020-04-02 NOTE — Assessment & Plan Note (Signed)
Likely lateral myalgia paresthesia. She is uncertain of her appointment date. I reviewed record and informed her of her appointment date and time.

## 2020-04-02 NOTE — Progress Notes (Addendum)
     SUBJECTIVE:   CHIEF COMPLAINT / HPI:   PAP: Here for f/u. No GU concerns. Denies recent sexual activity, but will like GC/Chlamydia checked.  Depression: She is compliant and doing well on Trazodone. Denies SI or HI. Neutropenia: Yet to f/u with her hematologist. CHF: No SOB. Paresthesia: Still C/O lateral tingling and numbness of her right thigh. She has neuro appointment for next week.  PERTINENT  PMH / PSH: PMX reviewed  OBJECTIVE:   BP 140/72 Comment: provider infomed  Pulse 100   Ht 5\' 1"  (1.549 m)   Wt 180 lb 2 oz (81.7 kg)   LMP 10/22/2014 (Approximate)   SpO2 98%   BMI 34.03 kg/m    Physical Exam Vitals and nursing note reviewed. Exam conducted with a chaperone present Cherrie Distance Legette).  Cardiovascular:     Rate and Rhythm: Normal rate and regular rhythm.     Pulses: Normal pulses.     Heart sounds: Normal heart sounds. No murmur.  Pulmonary:     Effort: Pulmonary effort is normal. No respiratory distress.     Breath sounds: Normal breath sounds.  Abdominal:     General: Abdomen is flat. Bowel sounds are normal. There is no distension.     Palpations: Abdomen is soft. There is no mass.     Tenderness: There is no abdominal tenderness.  Genitourinary:    Exam position: Lithotomy position.     Vagina: Vaginal discharge present.     Cervix: No cervical motion tenderness.     Uterus: Normal.      Adnexa: Right adnexa normal.  Musculoskeletal:     Right lower leg: No edema.     Left lower leg: No edema.  Neurological:     Mental Status: She is alert.      ASSESSMENT/PLAN:   Gyn exam: PAP Completed today. I will call with her result Wet prep completed as well as GC/Chlamydia.  I still don't have her wet prep report. I will call with result.   MDD (major depressive disorder), recurrent episode, moderate (HCC) No acute change. Compliant with Trazodone.  Neutropenia ([place]) Advised f/u with hematologist. She will call for an  appointment.  Hypertensive heart disease with congestive heart failure ([place]) I reviewed record from her cardiologist from July 2020. She appears stable and his doing well on her current regimen. BP looks great today. Monitor closely.  Paresthesia Likely lateral myalgia paresthesia. She is uncertain of her appointment date. I reviewed record and informed her of her appointment date and time. I discussed increasing Lyrica to 150 mg TID. She agreed.    Andrena Mews, MD [place]   Addendum: Completed after her neuro visit. I noticed that neuro adjusted her regimen with Lamictal in addition to Lyrica up to 400 mg qd. I will not fill Lyrica 150 mg TID for now. I will reassess her pain based on neuro's recommendation at her follow-up.

## 2020-04-02 NOTE — Patient Instructions (Signed)
Preventing Health Risks of Being Overweight Maintaining a healthy body weight is an important part of your overall health. Your healthy body weight depends on your age, gender, and height. Being overweight puts you at risk for many health problems, including:  Heart disease.  Diabetes.  Problems sleeping.  Joint problems. You can make changes to your diet and lifestyle to prevent these risks. Consider working with a health care provider or a dietitian to make these changes. What nutrition changes can be made?   Eat only as much as your body needs. In most cases, this is about 2,000 calories a day, but the amount varies depending on your height, gender, and activity level. Ask your health care provider how many calories you should have each day. Eating more than your body needs on a regular basis can cause you to become overweight or obese.  Eat slowly, and stop eating when you feel full.  Choose healthy foods, including: ? Fruits and vegetables. ? Lean meats. ? Low-fat dairy products. ? High-fiber foods, such as whole grains and beans. ? Healthy snacks like vegetable sticks, a piece of fruit, or a small amount of yogurt or cheese.  Avoid foods and drinks that are high in sugar, salt (sodium), saturated fat, or trans fat. This includes: ? Many desserts such as candy, cookies, and ice cream. ? Soda. ? Fried foods. ? Processed meats such as hot dogs or lunch meats. ? Prepackaged snack foods. What lifestyle changes can be made?   Exercise for at least 150 minutes a week to prevent weight gain, or as often as recommended by your health care provider. Do moderate-intensity exercise, such as brisk walking. ? Spread it out by exercising for 30 minutes 5 days a week, or in short 10-minute bursts several times a day.  Find other ways to stay active and burn calories, such as yard work or a hobby that involves physical activity.  Get at least 8 hours of sleep each night. When you are  well-rested, you are more likely to be active and make healthy choices during the day. To sleep better: ? Try to go to bed and wake up at about the same time every day. ? Keep your bedroom dark, quiet, and cool. ? Make sure that your bed is comfortable. ? Avoid stimulating activities, such as watching television or exercising, for at least one hour before bedtime. Why are these changes important? Eating healthy and being active helps you lose weight and prevent health problems caused by being overweight. Making these changes can also help you manage stress, feel better mentally, and connect with friends and family. What can happen if changes are not made? Being overweight can affect you for your entire life. You may develop joint or bone problems that make it painful or difficult for you to play sports or do activities you enjoy. Being overweight puts stress on your heart and lungs and can lead to medical problems like diabetes, heart disease, and sleeping problems. Where to find support You can get support for preventing health risks of being overweight from:  Your health care provider or a dietitian. They can provide guidance about healthy eating and healthy lifestyle choices.  Weight loss support groups, online or in-person. Where to find more information  MyPlate: FormerBoss.no ? This an online tool that provides personalized recommendations about foods to eat each day.  The Centers for Disease Control and Prevention: http://sharp-hammond.biz/ ? This resource gives tips for managing weight and having an active lifestyle.  Summary  To prevent unhealthy weight gain, it is important to maintain a healthy diet high in vegetables and whole grains, exercise regularly, and get at least 8 hours of sleep each night.  Making these changes helps prevent many long-term (chronic) health conditions that can shorten your life, such as diabetes, heart disease, and stroke. This information is  not intended to replace advice given to you by your health care provider. Make sure you discuss any questions you have with your health care provider. Document Revised: 08/27/2019 Document Reviewed: 10/31/2017 Elsevier Patient Education  [employer].

## 2020-04-02 NOTE — Assessment & Plan Note (Signed)
I reviewed record from her cardiologist from July 2020. She appears stable and his doing well on her current regimen. BP looks great today. Monitor closely.

## 2020-04-02 NOTE — Telephone Encounter (Signed)
Left message for pt to call the office on Monday to give [place] the [facility]. Salvatore Marvel, CMA

## 2020-04-02 NOTE — Progress Notes (Signed)
Left message for pt to call the office to give [place] the name of the [facility] she uses. Salvatore Marvel, CMA

## 2020-04-02 NOTE — Assessment & Plan Note (Signed)
No acute change. Compliant with Trazodone.

## 2020-04-02 NOTE — Assessment & Plan Note (Signed)
Advised f/u with hematologist. She will call for an appointment.

## 2020-04-05 ENCOUNTER — Ambulatory Visit: Payer: Self-pay

## 2020-04-05 ENCOUNTER — Encounter: Payer: Self-pay | Admitting: Neurology

## 2020-04-05 ENCOUNTER — Ambulatory Visit: Payer: Medicare Other | Admitting: Neurology

## 2020-04-05 ENCOUNTER — Other Ambulatory Visit: Payer: Self-pay | Admitting: Family Medicine

## 2020-04-05 ENCOUNTER — Telehealth: Payer: Self-pay | Admitting: Family Medicine

## 2020-04-05 ENCOUNTER — Other Ambulatory Visit: Payer: Self-pay

## 2020-04-05 ENCOUNTER — Ambulatory Visit: Payer: Medicare Other

## 2020-04-05 VITALS — BP 130/78 | HR 76 | Temp 97.0°F | Ht 61.0 in | Wt 178.0 lb

## 2020-04-05 DIAGNOSIS — R269 Unspecified abnormalities of gait and mobility: Secondary | ICD-10-CM | POA: Diagnosis not present

## 2020-04-05 DIAGNOSIS — G959 Disease of spinal cord, unspecified: Secondary | ICD-10-CM | POA: Insufficient documentation

## 2020-04-05 DIAGNOSIS — G5711 Meralgia paresthetica, right lower limb: Secondary | ICD-10-CM | POA: Insufficient documentation

## 2020-04-05 DIAGNOSIS — R208 Other disturbances of skin sensation: Secondary | ICD-10-CM | POA: Diagnosis not present

## 2020-04-05 MED ORDER — LIDOCAINE 5 % EX PTCH: 1.0000 | MEDICATED_PATCH | CUTANEOUS | 1 refills | Status: DC

## 2020-04-05 MED ORDER — LAMOTRIGINE 100 MG PO TABS: 100.0000 mg | ORAL_TABLET | Freq: Two times a day (BID) | ORAL | 5 refills | Status: DC

## 2020-04-05 NOTE — Telephone Encounter (Signed)
Patient called back and pharmacy has been updated.  Will forward to MD to send medications over that were discussed at last encounter.  Stoy Fenn,CMA
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
## 2020-04-05 NOTE — Addendum Note (Signed)
Addended by: Andrena Mews T on: 04/05/2020 06:59 PM   Modules accepted: Orders

## 2020-04-05 NOTE — Chronic Care Management (AMB) (Signed)
  Care Management   Outreach Note  04/05/2020 Name: Maria Griffith MRN: [id number] DOB: Sep 09, 1961  Referred by: Kinnie [name], MD Reason for referral : Care Coordination (Care Management Asthma)   An unsuccessful telephone outreach was attempted today. The patient was referred to the case management team for assistance with care management and care coordination.   Follow Up Plan: A HIPPA compliant phone message was left for the patient providing contact information and requesting a return call.  The care management team will reach out to the patient again over the next 5-7 days.   Lazaro Arms RN, BSN, [facility] Care Management Coordinator [place] Phone: [phone] Fax: [phone]

## 2020-04-05 NOTE — Patient Instructions (Signed)
Lateral femoral cutaneous neuropathy  The pharmacy has the prescription for lamotrigine 100 mg tablets. For 5 days, just take one half pill a day. For the next 5 days, take one half pill twice a day. For the next 5 days, take one half pill 3 times a day Then start taking one pill twice a day from this point on.    In the future, we may increase the dose further.  If you get a rash, need to stop the medication and not take it again.

## 2020-04-05 NOTE — Telephone Encounter (Signed)
Pharmacy is [facility] °

## 2020-04-06 ENCOUNTER — Other Ambulatory Visit: Payer: Self-pay

## 2020-04-06 ENCOUNTER — Other Ambulatory Visit: Payer: Self-pay | Admitting: Family Medicine

## 2020-04-06 LAB — CYTOLOGY - PAP
Chlamydia: NEGATIVE
Comment: NEGATIVE
Comment: NEGATIVE
Comment: NORMAL
Diagnosis: HIGH — AB
High risk HPV: POSITIVE — AB
Neisseria Gonorrhea: NEGATIVE

## 2020-04-06 MED ORDER — DICLOFENAC SODIUM 1 % EX GEL: CUTANEOUS | 2 refills | Status: DC

## 2020-04-06 MED ORDER — ALBUTEROL SULFATE HFA 108 (90 BASE) MCG/ACT IN AERS: INHALATION_SPRAY | RESPIRATORY_TRACT | 3 refills | Status: DC

## 2020-04-06 MED ORDER — LOSARTAN POTASSIUM 100 MG PO TABS: 100.0000 mg | ORAL_TABLET | Freq: Every day | ORAL | 2 refills | Status: DC

## 2020-04-06 MED ORDER — VITAMIN D3 75 MCG (3000 UT) PO TABS: 1.0000 | ORAL_TABLET | Freq: Every day | ORAL | 2 refills | Status: AC

## 2020-04-06 MED ORDER — OXYBUTYNIN CHLORIDE 5 MG PO TABS: 5.0000 mg | ORAL_TABLET | Freq: Two times a day (BID) | ORAL | 2 refills | Status: DC

## 2020-04-06 MED ORDER — BUDESONIDE-FORMOTEROL FUMARATE 160-4.5 MCG/ACT IN AERO: 2.0000 | INHALATION_SPRAY | Freq: Two times a day (BID) | RESPIRATORY_TRACT | 1 refills | Status: DC

## 2020-04-06 MED ORDER — ALBUTEROL SULFATE (2.5 MG/3ML) 0.083% IN NEBU: INHALATION_SOLUTION | RESPIRATORY_TRACT | 5 refills | Status: DC

## 2020-04-06 MED ORDER — TRAZODONE HCL 150 MG PO TABS: 150.0000 mg | ORAL_TABLET | Freq: Every day | ORAL | 2 refills | Status: DC

## 2020-04-06 NOTE — Telephone Encounter (Signed)
PAP report discussed. Based on ASCCP recommendation, Colposcopy is recommended.  However, given persistent HPV and patient's anxiety, I will check with OB/Gyn if this will been an appropriate referral for treatment.  I will let the patient know once I hear back from OB.  In the meantime, I scheduled a colpo appointment for her.

## 2020-04-06 NOTE — Telephone Encounter (Signed)
I called to clarify BP regimen. She again confirmed compliance with Losartan and Hydralazine. BP holding up fine. Med refilled.

## 2020-04-08 ENCOUNTER — Telehealth: Payer: Self-pay | Admitting: Family Medicine

## 2020-04-08 DIAGNOSIS — R87611 Atypical squamous cells cannot exclude high grade squamous intraepithelial lesion on cytologic smear of cervix (ASC-H): Secondary | ICD-10-CM

## 2020-04-08 NOTE — Telephone Encounter (Signed)
-----   Message from Kinnie [name], MD sent at 04/08/2020  7:30 AM EDT ----- Regarding: PAP

## 2020-04-08 NOTE — Telephone Encounter (Signed)
I called to f/u on PAP report. Did not hear back from Gyn yet.   - Atypical squamous cells, cannot exclude high grade squamousAbnormal   + HPV   Recommendation is Colposcopy or Treatment.  At this point, I discussed Gyn referral for treatment option vs colpo. She agreed with the plan.   She also asked for handicap DMV form.  She is advised to let us know when she will like to come in to pick it up so we can get it ready for her.

## 2020-04-09 ENCOUNTER — Encounter: Payer: Self-pay | Admitting: Obstetrics and Gynecology

## 2020-04-12 ENCOUNTER — Telehealth: Payer: Self-pay | Admitting: *Deleted

## 2020-04-12 ENCOUNTER — Ambulatory Visit: Payer: Medicare Other

## 2020-04-12 ENCOUNTER — Other Ambulatory Visit: Payer: Self-pay

## 2020-04-12 NOTE — Telephone Encounter (Signed)
Initiated PA for Lidocaine patches on CMM. KeyIE:[id number]. In process of completing.

## 2020-04-13 NOTE — Telephone Encounter (Addendum)
Request Reference Number: [id number]. LIDOCAINE PAD 5% is approved through 12/17/2020. Your patient may now fill this prescription and it will be covered.  Sent approval info to [employer] at [phone]. Received fax confirmation.

## 2020-04-13 NOTE — Telephone Encounter (Signed)
Submitted PA. Waiting on determination from [employer] Medicare Part D.

## 2020-04-15 ENCOUNTER — Other Ambulatory Visit: Payer: Self-pay

## 2020-04-15 ENCOUNTER — Telehealth (INDEPENDENT_AMBULATORY_CARE_PROVIDER_SITE_OTHER): Payer: Medicare Other | Admitting: Family Medicine

## 2020-04-15 VITALS — Ht 61.0 in

## 2020-04-15 DIAGNOSIS — J302 Other seasonal allergic rhinitis: Secondary | ICD-10-CM | POA: Diagnosis not present

## 2020-04-15 DIAGNOSIS — H1032 Unspecified acute conjunctivitis, left eye: Secondary | ICD-10-CM | POA: Diagnosis not present

## 2020-04-15 MED ORDER — FLUTICASONE PROPIONATE 50 MCG/ACT NA SUSP: 2.0000 | Freq: Every day | NASAL | 6 refills | Status: DC

## 2020-04-15 MED ORDER — POLYMYXIN B-TRIMETHOPRIM 10000-0.1 UNIT/ML-% OP SOLN: 2.0000 [drp] | OPHTHALMIC | 0 refills | Status: AC

## 2020-04-15 NOTE — Assessment & Plan Note (Signed)
Patient sniffling throughout encounter.  States that she has been taking Claritin.  We will also send a prescription for Flonase to assist with her allergic rhinitis.

## 2020-04-15 NOTE — Progress Notes (Signed)
[facility] Tele[facility]icine Visit  Patient consented to have virtual visit and was identified by name and date of birth. Method of visit: Video  Encounter participants: Patient: Maria Griffith - located at home Provider: Cleophas Dunker - located at [facility] Others (if applicable): None  Chief Complaint: Left redness and drainage  HPI:  Left Eye redness and discharge Started 2 days ago Redness, irritated, light makes it worse Doesn't think she got anything in her eye Having a little discharge in her eyes that is watery and cloudy In the mornings it is matted and warm compresses help Has a watery discharge throughout the day Denies fevers, chills, congestion, cough Cannot feel any swollen lymph nodes on her neck No known sick contacts Vision seems blurry  Has allergies as well and thinks that they are acting up Took allergy medication yesterday (claritin) and it didn't help  ROS: per HPI  Pertinent PMHx: Allergic rhinitis, asthma, CHF, HTN  Exam:  Ht 5\' 1"  (1.549 m)   LMP 10/22/2014 (Approximate)   BMI 33.63 kg/m   Respiratory: Speaking in complete sentences, no evidence of respiratory distress over video, sniffing throughout encounter  Assessment/Plan:  Acute bacterial conjunctivitis of left eye Given that patient is having milky discharge, will opt to treat for bacterial conjunctivitis.  Will use Polytrim drops 4 times daily x5 days.  Patient advised to return to care if she does not have improvement, has worsening in symptoms, especially changes in vision or pain.  Patient reports understanding.  Also advised to continue using warm compresses.  Seasonal allergic rhinitis Patient sniffling throughout encounter.  States that she has been taking Claritin.  We will also send a prescription for Flonase to assist with her allergic rhinitis.    Time spent during visit with patient: 13 minutes

## 2020-04-15 NOTE — Assessment & Plan Note (Signed)
Given that patient is having milky discharge, will opt to treat for bacterial conjunctivitis.  Will use Polytrim drops 4 times daily x5 days.  Patient advised to return to care if she does not have improvement, has worsening in symptoms, especially changes in vision or pain.  Patient reports understanding.  Also advised to continue using warm compresses.

## 2020-04-19 ENCOUNTER — Encounter: Payer: No Typology Code available for payment source | Attending: Registered Nurse | Admitting: Registered Nurse

## 2020-04-19 ENCOUNTER — Other Ambulatory Visit: Payer: Self-pay

## 2020-04-19 ENCOUNTER — Encounter: Payer: Self-pay | Admitting: Registered Nurse

## 2020-04-19 VITALS — BP 135/75 | HR 77 | Temp 98.7°F | Ht 61.0 in | Wt 179.8 lb

## 2020-04-19 DIAGNOSIS — M5412 Radiculopathy, cervical region: Secondary | ICD-10-CM

## 2020-04-19 DIAGNOSIS — M7918 Myalgia, other site: Secondary | ICD-10-CM | POA: Insufficient documentation

## 2020-04-19 DIAGNOSIS — F5101 Primary insomnia: Secondary | ICD-10-CM | POA: Diagnosis present

## 2020-04-19 DIAGNOSIS — G894 Chronic pain syndrome: Secondary | ICD-10-CM | POA: Insufficient documentation

## 2020-04-19 DIAGNOSIS — Z79891 Long term (current) use of opiate analgesic: Secondary | ICD-10-CM | POA: Diagnosis present

## 2020-04-19 DIAGNOSIS — M4722 Other spondylosis with radiculopathy, cervical region: Secondary | ICD-10-CM | POA: Insufficient documentation

## 2020-04-19 DIAGNOSIS — Z5181 Encounter for therapeutic drug level monitoring: Secondary | ICD-10-CM | POA: Diagnosis present

## 2020-04-19 DIAGNOSIS — M5416 Radiculopathy, lumbar region: Secondary | ICD-10-CM | POA: Insufficient documentation

## 2020-04-19 DIAGNOSIS — M961 Postlaminectomy syndrome, not elsewhere classified: Secondary | ICD-10-CM | POA: Insufficient documentation

## 2020-04-19 DIAGNOSIS — M542 Cervicalgia: Secondary | ICD-10-CM

## 2020-04-19 MED ORDER — HYDROCODONE-ACETAMINOPHEN 7.5-325 MG PO TABS: ORAL_TABLET | ORAL | 0 refills | Status: DC
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
## 2020-04-20 ENCOUNTER — Other Ambulatory Visit: Payer: Self-pay | Admitting: Pharmacy Technician

## 2020-04-20 NOTE — Patient Instructions (Signed)
Visit Information  Goals Addressed            This Visit's Progress   . "My blood pressures are doing ok" (pt-stated)       Current Barriers:  . Chronic Disease Management support, education, and care coordination needs related to HTN  Clinical Goal(s) related to HTN:  Over the next 90 days, patient will:  . Work with the care management team to address educational, disease management, and care coordination needs  . Begin or continue self health monitoring activities as directed today Call provider office for new or worsened signs and symptoms  . Call care management team with questions or concerns . Verbalize basic understanding of patient centered plan of care established today  Interventions related to HTN:  . Evaluation of current treatment plans and patient's adherence to plan as established by provider . Assessed patient understanding of disease states . Assessed patient's education and care coordination needs . Provided disease specific education to patient  . Collaborated with appropriate clinical care team members regarding patient needs  . 12/24/19 . Patient has not been feeling well enough to check her blood pressures daily . She checked her blood pressure while on the phone with me 117/74 . Patient states that she has been . 04/05/20 . Spoke with the patient and she states that she has been doing fair .  She had an appointment with Dr Gwendlyn Deutscher last week on the 16th.  She states she has a new insurance with [employer] and her medications go to [place] now.  She is waiting on some inhalers . She states that she her BP has been good the last time she checked it was 130/78 her weight was 178 lbs. . She states that she has seen a specialist for her r thigh area and has right lateral femoral cutaneous neuropathy.  It started last February and it is a round patch that is sensitive to touch it feels like pens and needles.     Patient Self Care Activities related to HTN:  . Patient is  unable to independently self-manage chronic health conditions  Please see past updates related to this goal by clicking on the "Past Updates" button in the selected goal         Maria Griffith was given information about Care Management services today including:  1. Care Management services include personalized support from designated clinical staff supervised by her physician, including individualized plan of care and coordination with other care providers 2. 24/7 contact phone numbers for assistance for urgent and routine care needs. 3. The patient may stop CCM services at any time (effective at the end of the month) by phone call to the office staff.  Patient agreed to services and verbal consent obtained.   The patient verbalized understanding of instructions provided today and declined a print copy of patient instruction materials.   The patient has been provided with contact information for the care management team and has been advised to call with any health related questions or concerns.   Lazaro Arms RN, BSN, [facility] Care Management Coordinator [place] Phone: [phone] Fax: [phone]

## 2020-04-20 NOTE — Chronic Care Management (AMB) (Signed)
  Care Management   Follow Up Note   04/20/2020 Name: Maria Griffith MRN: [id number] DOB: [birth year]  Referred by: Kinnie [name], MD Reason for referral : Care Coordination (Care Management RNCM Asthma / HTN )   Maria Griffith is a 59 y.o. year old female who is a primary care patient of Kinnie [name], MD. The care management team was consulted for assistance with care management and care coordination needs.    Review of patient status, including review of consultants reports, relevant laboratory and other test results, and collaboration with appropriate care team members and the patient's provider was performed as part of comprehensive patient evaluation and provision of chronic care management services.    SDOH (Social Determinants of Health) assessments performed: No See Care Plan activities for detailed interventions related to [facility])     Advanced Directives: See Care Plan and Vynca application for related entries.   Goals Addressed            This Visit's Progress   . "My blood pressures are doing ok" (pt-stated)       Current Barriers:  . Chronic Disease Management support, education, and care coordination needs related to HTN  Clinical Goal(s) related to HTN:  Over the next 90 days, patient will:  . Work with the care management team to address educational, disease management, and care coordination needs  . Begin or continue self health monitoring activities as directed today Call provider office for new or worsened signs and symptoms  . Call care management team with questions or concerns . Verbalize basic understanding of patient centered plan of care established today  Interventions related to HTN:  . Evaluation of current treatment plans and patient's adherence to plan as established by provider . Assessed patient understanding of disease states . Assessed patient's education and care coordination needs . Provided disease specific education to patient   . Collaborated with appropriate clinical care team members regarding patient needs  . 12/24/19 . Patient has not been feeling well enough to check her blood pressures daily . She checked her blood pressure while on the phone with me 117/74 . Patient states that she has been . 04/05/20 . Spoke with the patient and she states that she has been doing fair .  She had an appointment with Dr Gwendlyn Deutscher last week on the 16th.  She states she has a new insurance with [employer] and her medications go to [place] now.  She is waiting on some inhalers . She states that she her BP has been good the last time she checked it was 130/78 her weight was 178 lbs. . She states that she has seen a specialist for her r thigh area and has right lateral femoral cutaneous neuropathy.  It started last February and it is a round patch that is sensitive to touch it feels like pens and needles.     Patient Self Care Activities related to HTN:  . Patient is unable to independently self-manage chronic health conditions  Please see past updates related to this goal by clicking on the "Past Updates" button in the selected goal          The patient has been provided with contact information for the care management team and has been advised to call with any health related questions or concerns.   Lazaro Arms RN, BSN, [facility] Care Management Coordinator [place] Phone: [phone] Fax: [phone]

## 2020-04-20 NOTE — Patient Outreach (Signed)
[place] [facility])  04/20/2020  [place] 1961-04-29 [id number]    Follow up call placed to [employer] regarding patient assistance application(s) for Symbicort , Dorothea Ogle confirms patient has been approved as of 03/25/2020 until 03/24/2021!!. Medication delivered to patients home on 03/30/20.  Follow up:  -Will remove myself from care team  Maud Deed. Chana Bode, [place] Certified Pharmacy Technician [employer]

## 2020-04-22 ENCOUNTER — Ambulatory Visit: Payer: Medicare Other

## 2020-04-22 ENCOUNTER — Ambulatory Visit: Payer: Medicare Other | Admitting: Obstetrics and Gynecology

## 2020-04-23 ENCOUNTER — Other Ambulatory Visit: Payer: Self-pay

## 2020-04-26 ENCOUNTER — Ambulatory Visit: Payer: Medicare Other | Admitting: Obstetrics and Gynecology

## 2020-04-26 NOTE — Patient Instructions (Signed)
Visit Information  Goals Addressed            This Visit's Progress   . "My blood pressures are doing ok" (pt-stated)       Current Barriers:  . Chronic Disease Management support, education, and care coordination needs related to HTN  Clinical Goal(s) related to HTN:  Over the next 90 days, patient will:  . Work with the care management team to address educational, disease management, and care coordination needs  . Begin or continue self health monitoring activities as directed today Call provider office for new or worsened signs and symptoms  . Call care management team with questions or concerns . Verbalize basic understanding of patient centered plan of care established today  Interventions related to HTN:  . Evaluation of current treatment plans and patient's adherence to plan as established by provider . Assessed patient understanding of disease states . Assessed patient's education and care coordination needs . Provided disease specific education to patient  . Collaborated with appropriate clinical care team members regarding patient needs  . 12/24/19 . Patient has not been feeling well enough to check her blood pressures daily . She checked her blood pressure while on the phone with me 117/74 . Patient states that she has been . 04/05/20 . Spoke with the patient and she states that she has been doing fair .  She had an appointment with Dr Gwendlyn Deutscher last week on the 16th.  She states she has a new insurance with [employer] and her medications go to [place] now.  She is waiting on some inhalers . She states that she her BP has been good the last time she checked it was 130/78 her weight was 178 lbs. . She states that she has seen a specialist for her r thigh area and has right lateral femoral cutaneous neuropathy.  It started last February and it is a round patch that is sensitive to touch it feels like pens and needles.  . 04/12/20 . Spoke with the patient and she stated that she met  with Dr Felecia Shelling her nerve specialist and he started her her on lamotrigine 100 mg bid. . Patient has an appointment on 5/10 with Waldo Laine    Patient Self Care Activities related to HTN:  . Patient is unable to independently self-manage chronic health conditions  Please see past updates related to this goal by clicking on the "Past Updates" button in the selected goal         Ms. Garmany was given information about Care Management services today including:  1. Care Management services include personalized support from designated clinical staff supervised by her physician, including individualized plan of care and coordination with other care providers 2. 24/7 contact phone numbers for assistance for urgent and routine care needs. 3. The patient may stop CCM services at any time (effective at the end of the month) by phone call to the office staff.  Patient agreed to services and verbal consent obtained.   The patient verbalized understanding of instructions provided today and declined a print copy of patient instruction materials.   The patient has been provided with contact information for the care management team and has been advised to call with any health related questions or concerns.    Lazaro Arms RN, BSN, [facility] Care Management Coordinator [place] Phone: [phone] Fax: [phone]

## 2020-04-26 NOTE — Chronic Care Management (AMB) (Signed)
Care Management   Follow Up Note   04/26/2020 Name: Maria Griffith MRN: [id number] DOB: Jan 29, 1961  Referred by: Maria [name], MD Reason for referral : Care Coordination (Care management RNCM F/U )   Maria Griffith is a 59 y.o. year old female who is a primary care patient of Maria [name], MD. The care management team was consulted for assistance with care management and care coordination needs.    Review of patient status, including review of consultants reports, relevant laboratory and other test results, and collaboration with appropriate care team members and the patient's provider was performed as part of comprehensive patient evaluation and provision of chronic care management services.    SDOH (Social Determinants of Health) assessments performed: No See Care Plan activities for detailed interventions related to [facility])     Advanced Directives: See Care Plan and Vynca application for related entries.   Goals Addressed            This Visit's Progress   . "My blood pressures are doing ok" (pt-stated)       Current Barriers:  . Chronic Disease Management support, education, and care coordination needs related to HTN  Clinical Goal(s) related to HTN:  Over the next 90 days, patient will:  . Work with the care management team to address educational, disease management, and care coordination needs  . Begin or continue self health monitoring activities as directed today Call provider office for new or worsened signs and symptoms  . Call care management team with questions or concerns . Verbalize basic understanding of patient centered plan of care established today  Interventions related to HTN:  . Evaluation of current treatment plans and patient's adherence to plan as established by provider . Assessed patient understanding of disease states . Assessed patient's education and care coordination needs . Provided disease specific education to patient   . Collaborated with appropriate clinical care team members regarding patient needs  . 12/24/19 . Patient has not been feeling well enough to check her blood pressures daily . She checked her blood pressure while on the phone with me 117/74 . Patient states that she has been . 04/05/20 . Spoke with the patient and she states that she has been doing fair .  She had an appointment with Maria Griffith last week on the 16th.  She states she has a new insurance with [employer] and her medications go to [facility] now.  She is waiting on some inhalers . She states that she her BP has been good the last time she checked it was 130/78 her weight was 178 lbs. . She states that she has seen a specialist for her r thigh area and has right lateral femoral cutaneous neuropathy.  It started last February and it is a round patch that is sensitive to touch it feels like pens and needles.  . 04/12/20 . Spoke with the patient and she stated that she met with Maria Maria Griffith her nerve specialist and he started her her on lamotrigine 100 mg bid. . Patient has an appointment on 5/10 with Maria Griffith    Patient Self Care Activities related to HTN:  . Patient is unable to independently self-manage chronic health conditions  Please see past updates related to this goal by clicking on the "Past Updates" button in the selected goal          The patient has been provided with contact information for the care management team and has been advised to  call with any health related questions or concerns.   Maria Arms RN, BSN, [facility] Care Management Coordinator [place] Phone: [phone] Fax: [phone]

## 2020-04-27 ENCOUNTER — Ambulatory Visit (INDEPENDENT_AMBULATORY_CARE_PROVIDER_SITE_OTHER): Payer: Medicare Other | Admitting: Obstetrics and Gynecology

## 2020-04-27 ENCOUNTER — Ambulatory Visit: Payer: Medicare Other

## 2020-04-27 ENCOUNTER — Other Ambulatory Visit: Payer: Self-pay

## 2020-04-27 ENCOUNTER — Encounter: Payer: Self-pay | Admitting: Obstetrics and Gynecology

## 2020-04-27 VITALS — BP 136/76 | HR 77 | Temp 98.1°F | Ht 62.0 in | Wt 172.0 lb

## 2020-04-27 DIAGNOSIS — N882 Stricture and stenosis of cervix uteri: Secondary | ICD-10-CM | POA: Diagnosis not present

## 2020-04-27 DIAGNOSIS — N898 Other specified noninflammatory disorders of vagina: Secondary | ICD-10-CM

## 2020-04-27 DIAGNOSIS — R87611 Atypical squamous cells cannot exclude high grade squamous intraepithelial lesion on cytologic smear of cervix (ASC-H): Secondary | ICD-10-CM | POA: Diagnosis not present

## 2020-04-27 DIAGNOSIS — B977 Papillomavirus as the cause of diseases classified elsewhere: Secondary | ICD-10-CM | POA: Diagnosis not present

## 2020-04-27 NOTE — Patient Instructions (Signed)
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
## 2020-04-27 NOTE — Chronic Care Management (AMB) (Signed)
  Care Management   Outreach Note  04/27/2020 Name: Maria Griffith MRN: [id number] DOB: 10-19-1961  Referred by: Kinnie [name], MD Reason for referral : Care Coordination (Care Management RNCM F/U )  RNCM reached out to the patient but she states that she was unavailable to talk this morning because she has an appointment that she needs to go to.  She asked If I could call her tomorrow at 1 pm.  Follow Up Plan: The care management team will reach out to the patient again over the next 3 days.  The patient has been provided with contact information for the care management team and has been advised to call with any health related questions or concerns.   Lazaro Arms RN, BSN, [facility] Care Management Coordinator [place] Phone: [phone] Fax: [phone]

## 2020-04-28 ENCOUNTER — Ambulatory Visit: Payer: Medicare Other

## 2020-04-28 ENCOUNTER — Encounter: Payer: Self-pay | Admitting: Obstetrics and Gynecology

## 2020-04-28 NOTE — Chronic Care Management (AMB) (Signed)
  Care Management   Outreach Note  04/28/2020 Name: Maria Griffith MRN: [id number] DOB: 1961-05-23  Referred by: Kinnie [name], MD Reason for referral : Care Coordination (Care Management RNCM 2 nd attemp F/U)   A second unsuccessful telephone outreach was attempted today. The patient was referred to the case management team for assistance with care management and care coordination.   Follow Up Plan: The care management team will reach out to the patient again over the next 7-14 days.  The patient has been provided with contact information for the care management team and has been advised to call with any health related questions or concerns.   Lazaro Arms RN, BSN, [facility] Care Management Coordinator [place] Phone: [phone] Fax: [phone]

## 2020-04-29 ENCOUNTER — Other Ambulatory Visit (HOSPITAL_COMMUNITY)
Admission: RE | Admit: 2020-04-29 | Discharge: 2020-04-29 | Disposition: A | Discharge status: Home / Self Care | Payer: Medicare Other | Source: Ambulatory Visit | Attending: Obstetrics and Gynecology | Admitting: Obstetrics and Gynecology

## 2020-04-29 DIAGNOSIS — B977 Papillomavirus as the cause of diseases classified elsewhere: Secondary | ICD-10-CM | POA: Diagnosis present

## 2020-04-29 DIAGNOSIS — R87611 Atypical squamous cells cannot exclude high grade squamous intraepithelial lesion on cytologic smear of cervix (ASC-H): Secondary | ICD-10-CM | POA: Diagnosis present

## 2020-04-29 NOTE — Addendum Note (Signed)
Addended by: Dorothy Spark on: 04/29/2020 05:13 PM   Modules accepted: Orders

## 2020-04-30 LAB — NUSWAB VAGINITIS (VG)
Atopobium vaginae: HIGH Score — AB
BVAB 2: HIGH Score — AB
Candida albicans, NAA: NEGATIVE
Candida glabrata, NAA: NEGATIVE
Trich vag by NAA: NEGATIVE

## 2020-05-03 ENCOUNTER — Telehealth: Payer: Self-pay

## 2020-05-03 LAB — SURGICAL PATHOLOGY

## 2020-05-03 NOTE — Telephone Encounter (Signed)
-----   Message from Salvadore Dom, MD sent at 05/03/2020  8:12 AM EDT ----- Please inform the patient that her vaginitis probe was + for BV and treat with flagyl (either oral or vaginal, her choice), no ETOH while on Flagyl.  Oral: Flagyl 500 mg BID x 7 days, or Vaginal: Metrogel, 1 applicator per vagina q day x 5 days. Cervical pathology still pending, should be back any day.

## 2020-05-03 NOTE — Telephone Encounter (Signed)
Left message for pt to return call to triage RN. 

## 2020-05-04 ENCOUNTER — Telehealth: Payer: Self-pay | Admitting: *Deleted

## 2020-05-04 ENCOUNTER — Telehealth: Payer: Self-pay

## 2020-05-04 MED ORDER — METRONIDAZOLE 500 MG PO TABS: 500.0000 mg | ORAL_TABLET | Freq: Two times a day (BID) | ORAL | 0 refills | Status: AC

## 2020-05-04 NOTE — Telephone Encounter (Signed)
Spoke with patient. Advised of results as seen below from [place]. Patient verbalizes understanding and would like to proceed with scheduling in the OR. Advised will have insurance department call her regarding benefits and then we can proceed with scheduling a date. Patient is agreeable.

## 2020-05-04 NOTE — Telephone Encounter (Signed)
Spoke to pt. Pt given results per Dr Talbert Nan. Pt agreeable and verbalized understanding. Rx Flagyl sent to pharmacy on file. Pt aware that ECC not resulted yet. Will return call to pt when resulted. Pt agreeable.   Routing to Dr Talbert Nan for review.  Encounter closed.

## 2020-05-04 NOTE — Telephone Encounter (Signed)
Patient is returning call.  °

## 2020-05-04 NOTE — Telephone Encounter (Signed)
-----   Message from Salvadore Dom, MD sent at 05/04/2020  2:42 PM EDT ----- Please let the patient know that her ECC was negative for dysplasia. This doesn't explain her abnormal pap. She needs a leep. Given her cardiac issues, I think we should do this in the OR. The Cardiology  PA thought we could do it in the office, but might need to control her BP. We are not set up for that.

## 2020-05-04 NOTE — Telephone Encounter (Signed)
Prior authorization submitted via covermymeds for hydrocodone-acetaminophen  Approved

## 2020-05-05 NOTE — Telephone Encounter (Signed)
Spoke with patient regarding surgery benefits. Patient acknowledges understanding of information presented. Patient is aware that benefits presented are for professional benefits only. Patient is aware that once surgery is scheduled, the hospital will call with separate benefits. Patient is aware of surgery cancellation policy. ° °Patient is ready to proceed with scheduling. °

## 2020-05-05 NOTE — Telephone Encounter (Signed)
Spoke with patient. Patient would like to speak with the pre service center and return call to discuss scheduling.

## 2020-05-07 NOTE — Telephone Encounter (Signed)
Spoke with patient. Patient would like to schedule LEEP for 06/14/2020. LEEP scheduled for 06/14/2020 at 11:30 am at [facility]. Pre op scheduled for 05/25/2020 at 3:30 pm. COVID test scheduled for 06/10/2020 at 1 pm. Patient is aware of the need to quarantine after test until surgery. 4 week post op scheduled for 07/15/2020 at 11:30 am with Dr.Jertson. Patient is agreeable to all dates and times. Surgery instructions reviewed and mailed to patient's home address on file.  Routing to provider and will close encounter.

## 2020-05-10 ENCOUNTER — Ambulatory Visit: Payer: Medicare Other

## 2020-05-10 ENCOUNTER — Other Ambulatory Visit: Payer: Self-pay

## 2020-05-10 NOTE — Chronic Care Management (AMB) (Signed)
Care Management   Follow Up Note   05/10/2020 Name: STEPHANA HADAD MRN: [id number] DOB: 1961/03/01  Referred by: Kinnie [name], MD Reason for referral : Care Coordination (Care Management RNCM 3 rd attempt )   Maria Griffith is a 59 y.o. year old female who is a primary care patient of Kinnie [name], MD. The care management team was consulted for assistance with care management and care coordination needs.    Review of patient status, including review of consultants reports, relevant laboratory and other test results, and collaboration with appropriate care team members and the patient's provider was performed as part of comprehensive patient evaluation and provision of chronic care management services.    SDOH (Social Determinants of Health) assessments performed: No See Care Plan activities for detailed interventions related to [facility])     Advanced Directives: See Care Plan and Vynca application for related entries.   Goals Addressed            This Visit's Progress   . "My blood pressures are doing ok" (pt-stated)       Current Barriers:  . Chronic Disease Management support, education, and care coordination needs related to HTN  Clinical Goal(s) related to HTN:  Over the next 90 days, patient will:  . Work with the care management team to address educational, disease management, and care coordination needs  . Begin or continue self health monitoring activities as directed today Call provider office for new or worsened signs and symptoms  . Call care management team with questions or concerns . Verbalize basic understanding of patient centered plan of care established today  Interventions related to HTN:  . Evaluation of current treatment plans and patient's adherence to plan as established by provider . Assessed patient understanding of disease states . Assessed patient's education and care coordination needs . Provided disease specific education to patient   . Collaborated with appropriate clinical care team members regarding patient needs . 05/10/20 . Patient states that her BP is doing well.  She did not give me a value today. . She stated that she stopped taking the medication doctor Sater gave her for her leg.  It broke her out in a very bad rash.  She states that she continues to use the patch and Lyrica. . She states that she has a LEEP excision scheduled for 6/28 . She states that she feels that her hydrocodone, trazodone and lyrica cause her not to focus and lose concentration and she wants to take herbal medications to help focus.  I advised her before she begins to take any over the counter meds she need to research them, write  them down and talk with Dr Gwendlyn Deutscher before she takes anything to make sure they will not interact with any medications that she is taking.  She verbalized understanding.   Patient Self Care Activities related to HTN:  . Patient is unable to independently self-manage chronic health conditions  Please see past updates related to this goal by clicking on the "Past Updates" button in the selected goal      . "When I cough I have thick phlegm to fly out of my mouth" (pt-stated)       Current Barriers:  . Chronic Disease Management support, education, and care coordination needs related to  Asthma  Clinical Goal(s) related to  Asthma :  Over the next 90 days, patient will:  . Work with the care management team to address educational, disease management, and  care coordination needs  . Begin or continue self health monitoring activities as directed today  . Call provider office for new or worsened signs and symptoms  . Call care management team with questions or concerns . Verbalize basic understanding of patient centered plan of care established today  Interventions related to  Asthma :  . Evaluation of current treatment plans and patient's adherence to plan as established by provider . Assessed patient  understanding of disease states . Assessed patient's education and care coordination needs . Provided disease specific education to patient  . 05/10/20 . Patient states that her breathing is doing well . She is using her inhalers as prescribed . She knows that the heat and pollen can affect her breathing,  advised the patient if she is going to be outside around when grass is being cut that she may need to wear a mask to help cut down with the pollen  stay out of the heat as much as possible. Patient verbalized understanding.    Patient Self Care Activities related to  Astma :  . Patient is unable to independently self-manage chronic health conditions  Please see past updates related to this goal by clicking on the "Past Updates" button in the selected goal          The care management team will reach out to the patient again over the next 14 days.  The patient has been provided with contact information for the care management team and has been advised to call with any health related questions or concerns.   Lazaro Arms RN, BSN, [facility] Care Management Coordinator [place] Phone: [phone] Fax: [phone]

## 2020-05-10 NOTE — Patient Instructions (Signed)
Visit Information  Goals Addressed            This Visit's Progress   . "My blood pressures are doing ok" (pt-stated)       Current Barriers:  . Chronic Disease Management support, education, and care coordination needs related to HTN  Clinical Goal(s) related to HTN:  Over the next 90 days, patient will:  . Work with the care management team to address educational, disease management, and care coordination needs  . Begin or continue self health monitoring activities as directed today Call provider office for new or worsened signs and symptoms  . Call care management team with questions or concerns . Verbalize basic understanding of patient centered plan of care established today  Interventions related to HTN:  . Evaluation of current treatment plans and patient's adherence to plan as established by provider . Assessed patient understanding of disease states . Assessed patient's education and care coordination needs . Provided disease specific education to patient  . Collaborated with appropriate clinical care team members regarding patient needs . 05/10/20 . Patient states that her BP is doing well.  She did not give me a value today. . She stated that she stopped taking the medication doctor Sater gave her for her leg.  It broke her out in a very bad rash.  She states that she continues to use the patch and Lyrica. . She states that she has a LEEP excision scheduled for 6/28 . She states that she feels that her hydrocodone, trazodone and lyrica cause her not to focus and lose concentration and she wants to take herbal medications to help focus.  I advised her before she begins to take any over the counter meds she need to research them, write  them down and talk with Dr Gwendlyn Deutscher before she takes anything to make sure they will not interact with any medications that she is taking.  She verbalized understanding.   Patient Self Care Activities related to HTN:  . Patient is unable to  independently self-manage chronic health conditions  Please see past updates related to this goal by clicking on the "Past Updates" button in the selected goal      . "When I cough I have thick phlegm to fly out of my mouth" (pt-stated)       Current Barriers:  . Chronic Disease Management support, education, and care coordination needs related to  Asthma  Clinical Goal(s) related to  Asthma :  Over the next 90 days, patient will:  . Work with the care management team to address educational, disease management, and care coordination needs  . Begin or continue self health monitoring activities as directed today  . Call provider office for new or worsened signs and symptoms  . Call care management team with questions or concerns . Verbalize basic understanding of patient centered plan of care established today  Interventions related to  Asthma :  . Evaluation of current treatment plans and patient's adherence to plan as established by provider . Assessed patient understanding of disease states . Assessed patient's education and care coordination needs . Provided disease specific education to patient  . 05/10/20 . Patient states that her breathing is doing well . She is using her inhalers as prescribed . She knows that the heat and pollen can affect her breathing,  advised the patient if she is going to be outside around when grass is being cut that she may need to wear a mask to help cut down  with the pollen  stay out of the heat as much as possible. Patient verbalized understanding.    Patient Self Care Activities related to  Astma :  . Patient is unable to independently self-manage chronic health conditions  Please see past updates related to this goal by clicking on the "Past Updates" button in the selected goal         Ms. Pattan was given information about Care Management services today including:  1. Care Management services include personalized support from designated  clinical staff supervised by her physician, including individualized plan of care and coordination with other care providers 2. 24/7 contact phone numbers for assistance for urgent and routine care needs. 3. The patient may stop CCM services at any time (effective at the end of the month) by phone call to the office staff.  Patient agreed to services and verbal consent obtained.   The patient verbalized understanding of instructions provided today and declined a print copy of patient instruction materials.   The care management team will reach out to the patient again over the next 14 days.  The patient has been provided with contact information for the care management team and has been advised to call with any health related questions or concerns.   Lazaro Arms RN, BSN, [facility] Care Management Coordinator [place] Phone: [phone] Fax: [phone]

## 2020-05-21 ENCOUNTER — Telehealth: Payer: Self-pay | Admitting: Registered Nurse

## 2020-05-21 NOTE — Telephone Encounter (Signed)
She would like fax sent to [employer] [phone] [place].  Asked if Zella Ball can send same paperwork regarding equipment tens unit, and heating pad, so they can get adjustor to complete.

## 2020-05-24 ENCOUNTER — Other Ambulatory Visit: Payer: Self-pay

## 2020-05-24 ENCOUNTER — Other Ambulatory Visit: Payer: Self-pay | Admitting: Registered Nurse

## 2020-05-24 ENCOUNTER — Other Ambulatory Visit: Payer: Self-pay | Admitting: Neurology

## 2020-05-24 ENCOUNTER — Other Ambulatory Visit: Payer: Self-pay | Admitting: Physical Medicine & Rehabilitation

## 2020-05-25 ENCOUNTER — Other Ambulatory Visit: Payer: Self-pay | Admitting: Registered Nurse

## 2020-05-25 ENCOUNTER — Telehealth: Payer: Self-pay | Admitting: Registered Nurse

## 2020-05-25 ENCOUNTER — Ambulatory Visit: Payer: Medicare Other

## 2020-05-25 ENCOUNTER — Ambulatory Visit: Payer: Medicare Other | Admitting: Obstetrics and Gynecology

## 2020-05-25 ENCOUNTER — Telehealth: Payer: Self-pay | Admitting: Obstetrics and Gynecology

## 2020-05-25 ENCOUNTER — Encounter: Payer: Medicare Other | Admitting: Registered Nurse

## 2020-05-25 NOTE — Telephone Encounter (Signed)
Ms. Myhre called back with Attorney information: [place]  Phone [phone] Fax [phone] Attn Judson Roch

## 2020-05-25 NOTE — Telephone Encounter (Signed)
Patient's appointment for pre op has been canceled today due to md delayed in surgery. To Kaitlyn to assist with rescheduling with Dr.Jertson.

## 2020-05-25 NOTE — Telephone Encounter (Signed)
This provider spoke with Lattie Haw regarding Ms. Renato Battles request to fax the information to her lawyer at [employer], regarding Ms. Renato Battles letter to Mr. Laneta Simmers the adjustor,  1. Therma Care Heat Wraps: Cervical 2. Therma Care Heat Wraps Lumbar 3. Ten's Unit Lead Wires for Tens Unit Pro-M 200 4.Electrode Pads for Tens Unit Pro-M 200 Lattie Haw gave permission to Fax the above to [employer].  Placed a call to Ms. Renato Battles regarding the above, no answer left a message.

## 2020-05-25 NOTE — Telephone Encounter (Signed)
I think 1 week prior is fine

## 2020-05-25 NOTE — Telephone Encounter (Signed)
Medication refill

## 2020-05-25 NOTE — Telephone Encounter (Signed)
Placed a call to Ms. Renato Battles regarding her request,she was ask to return the call with the Law Firm information and the above will be discussed with Godfrey Pick, she verbalizes understanding.

## 2020-05-25 NOTE — Chronic Care Management (AMB) (Signed)
  Care Management   Outreach Note  05/25/2020 Name: Maria Griffith MRN: [id number] DOB: [birth year]  Referred by: Kinnie [name], MD Reason for referral : Care Coordination (Care Management RNCM HTN/Asthma)   An unsuccessful telephone outreach was attempted today. The patient was referred to the case management team for assistance with care management and care coordination.   Follow Up Plan: A HIPPA compliant phone message was left for the patient providing contact information and requesting a return call.  The care management team will reach out to the patient again over the next 7-14 days.   Lazaro Arms RN, BSN, [facility] Care Management Coordinator [place] Phone: [phone] Fax: [phone]

## 2020-05-25 NOTE — Telephone Encounter (Signed)
Spoke with patient. Offered appointment this Friday 05/28/2020, but patient declines due to another appointment time conflict. Offered 6/21 but patient declines. Pre op scheduled for 06/08/2020 at 4:30 pm with Dr.Jertson. This was the next available. Patient is agreeable to date and time. Advised will need to review with Dr.Jertson as this is 1 week before her surgery and she may want to see her before.  De.Jertson, okay to keep as scheduled?

## 2020-05-25 NOTE — Telephone Encounter (Signed)
Ms. Monica called back with Attorney information: [place]  Phone [phone] Fax [phone] Attn Judson Roch

## 2020-05-27 ENCOUNTER — Other Ambulatory Visit: Payer: Self-pay

## 2020-05-27 ENCOUNTER — Encounter: Payer: Self-pay | Admitting: Registered Nurse

## 2020-05-27 ENCOUNTER — Encounter: Payer: No Typology Code available for payment source | Attending: Registered Nurse | Admitting: Registered Nurse

## 2020-05-27 VITALS — BP 112/70 | HR 90 | Temp 97.9°F | Ht 62.0 in | Wt 177.6 lb

## 2020-05-27 DIAGNOSIS — M542 Cervicalgia: Secondary | ICD-10-CM | POA: Insufficient documentation

## 2020-05-27 DIAGNOSIS — Z5181 Encounter for therapeutic drug level monitoring: Secondary | ICD-10-CM | POA: Diagnosis present

## 2020-05-27 DIAGNOSIS — M5416 Radiculopathy, lumbar region: Secondary | ICD-10-CM

## 2020-05-27 DIAGNOSIS — M4722 Other spondylosis with radiculopathy, cervical region: Secondary | ICD-10-CM | POA: Insufficient documentation

## 2020-05-27 DIAGNOSIS — M7918 Myalgia, other site: Secondary | ICD-10-CM | POA: Diagnosis present

## 2020-05-27 DIAGNOSIS — M961 Postlaminectomy syndrome, not elsewhere classified: Secondary | ICD-10-CM | POA: Diagnosis not present

## 2020-05-27 DIAGNOSIS — M5412 Radiculopathy, cervical region: Secondary | ICD-10-CM | POA: Insufficient documentation

## 2020-05-27 DIAGNOSIS — Z79891 Long term (current) use of opiate analgesic: Secondary | ICD-10-CM | POA: Diagnosis present

## 2020-05-27 DIAGNOSIS — G894 Chronic pain syndrome: Secondary | ICD-10-CM | POA: Diagnosis present

## 2020-05-27 MED ORDER — TIZANIDINE HCL 4 MG PO TABS: ORAL_TABLET | ORAL | 3 refills | Status: DC

## 2020-05-27 MED ORDER — HYDROCODONE-ACETAMINOPHEN 7.5-325 MG PO TABS: ORAL_TABLET | ORAL | 0 refills | Status: DC

## 2020-05-27 MED ORDER — SENNOSIDES-DOCUSATE SODIUM 8.6-50 MG PO TABS: 1.0000 | ORAL_TABLET | Freq: Every day | ORAL | 5 refills | Status: DC
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
## 2020-06-07 ENCOUNTER — Telehealth: Payer: Self-pay

## 2020-06-07 NOTE — Telephone Encounter (Signed)
Patient called in regards to wanting to know if she needs to take a covid test before her appointment tomorrow.

## 2020-06-08 ENCOUNTER — Encounter: Payer: Self-pay | Admitting: Obstetrics and Gynecology

## 2020-06-08 ENCOUNTER — Ambulatory Visit: Payer: Medicare Other | Admitting: Obstetrics and Gynecology

## 2020-06-08 ENCOUNTER — Other Ambulatory Visit: Payer: Self-pay

## 2020-06-08 ENCOUNTER — Ambulatory Visit (INDEPENDENT_AMBULATORY_CARE_PROVIDER_SITE_OTHER): Payer: Medicare Other | Admitting: Obstetrics and Gynecology

## 2020-06-08 VITALS — BP 136/72 | HR 79 | Temp 97.9°F | Ht 62.0 in | Wt 181.0 lb

## 2020-06-08 DIAGNOSIS — R87611 Atypical squamous cells cannot exclude high grade squamous intraepithelial lesion on cytologic smear of cervix (ASC-H): Secondary | ICD-10-CM

## 2020-06-08 DIAGNOSIS — N882 Stricture and stenosis of cervix uteri: Secondary | ICD-10-CM

## 2020-06-08 DIAGNOSIS — B977 Papillomavirus as the cause of diseases classified elsewhere: Secondary | ICD-10-CM

## 2020-06-08 NOTE — Telephone Encounter (Signed)
Spoke with patient.  Covid 19 prescreen completed, precautions reviewed.  Confirmed OV for today at 3pm, arrive at 2:45pm.  Advised no Covid testing needed prior to OV today, will need to keep pre-op Covid testing scheduled for 6/24. Surgery on 06/14/20. Questions answered. Patient verbalizes understanding and is agreeable.   Encounter closed.
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
## 2020-06-09 ENCOUNTER — Other Ambulatory Visit: Payer: Self-pay

## 2020-06-09 ENCOUNTER — Telehealth: Payer: Self-pay | Admitting: Obstetrics and Gynecology

## 2020-06-09 ENCOUNTER — Encounter (HOSPITAL_BASED_OUTPATIENT_CLINIC_OR_DEPARTMENT_OTHER): Payer: Self-pay | Admitting: Obstetrics and Gynecology

## 2020-06-09 ENCOUNTER — Encounter: Payer: Self-pay | Admitting: Obstetrics and Gynecology

## 2020-06-09 NOTE — Telephone Encounter (Signed)
Spoke with patient. Patient returning call to advise that she spoke with the [facility] preo-op nurse and her questions were answered. Patient thankful for f/u.   Encounter closed.

## 2020-06-09 NOTE — Telephone Encounter (Signed)
Patient is asking to talk with Dr.Jertson's nurse about what about what medications she can take the day of her procedure.

## 2020-06-09 NOTE — Telephone Encounter (Signed)
Call to patient, left detailed message, ok per dpr. Advised [facility] pre-op nurse should be contacting you directly to schedule your pre-op visit. They will review your medications with you at that time and advise what you can take day of surgery. You may contact [facility] pre-op nurse directly at [phone].   Return call to office if you have any additional questions.   Routing to provider for final review. Patient is agreeable to disposition. Will close encounter.

## 2020-06-09 NOTE — Progress Notes (Signed)
Spoke w/ via phone for pre-op interview---pt Lab needs dos----I stat 8, ekg             COVID test ------06-10-2020@1300  pm Arrive at -------930 am 06-14-2020 No food after midnight clear liquids from midnight until 830 am then npo, no milk, cream or dairy products Medications to take morning of surgery -----albuterol nebulizer, albuterol inhaler prn/bring inhaler, symbicort, lyrica, loratadine, hydrazaline, flonase, tizanidine, oxybutynin, oxybutynin, hydrocodone Diabetic medication -----n/a Patient Special Instructions -----none Pre-Op special Istructions -----none Patient verbalized understanding of instructions that were given at this phone interview. Patient denies shortness of breath, chest pain, fever, cough a this phone interview.  Anesthesia : hx of hypertensive heqrt disease, chronic diastolic heart failure, hx of syncope and chest pain in 2013, sob with exertion cannot walk up flight of steps, trouble walking long distances, does light housework, lov cardiology michelle lentz pa 07-01-2019, was supposed to follow up with dr Angelena Form 10-08-2019  Chart to Janett Billow zanetto pa for review  OTL:XBWIOMB eniola cone family pracitce lov 04-02-2020 Cardiologist :dr Angelena Form, Cassell Clement michelle lentz pa 07-01-2019 epic No pulmonary md per patient Chest x-ray :none EKG :none recent Echo :05-27-2018 Cardiac Cath : none Activity level: sob with exertion cannot walk up flight of steps, trouble walking long distances, does light housework Sleep Study/ CPAP :none Fasting Blood Sugar :      / Checks Blood Sugar -- times a day:  n/a Blood Thinner/ Instructions /Last Dose:n/a ASA / Instructions/ Last Dose : n/a

## 2020-06-09 NOTE — Telephone Encounter (Signed)
Patient called to talked with Sharee Pimple again.

## 2020-06-10 ENCOUNTER — Ambulatory Visit: Payer: Medicare Other

## 2020-06-10 ENCOUNTER — Other Ambulatory Visit (HOSPITAL_COMMUNITY)
Admission: RE | Admit: 2020-06-10 | Discharge: 2020-06-10 | Disposition: A | Discharge status: Home / Self Care | Payer: Medicare Other | Source: Ambulatory Visit | Attending: Obstetrics and Gynecology | Admitting: Obstetrics and Gynecology

## 2020-06-10 DIAGNOSIS — Z20822 Contact with and (suspected) exposure to covid-19: Secondary | ICD-10-CM | POA: Insufficient documentation

## 2020-06-10 DIAGNOSIS — Z01812 Encounter for preprocedural laboratory examination: Secondary | ICD-10-CM | POA: Insufficient documentation

## 2020-06-10 LAB — SARS CORONAVIRUS 2 (TAT 6-24 HRS): SARS Coronavirus 2: NEGATIVE

## 2020-06-10 NOTE — Chronic Care Management (AMB) (Signed)
°  Care Management   Follow Up Note   06/10/2020 Name: Maria Griffith MRN: [id number] DOB: 23-Dec-1960  Referred by: Kinnie [name], MD Reason for referral : Care Coordination (Care Management RNCM HTN/ Asthma)   Maria Griffith is a 59 y.o. year old female who is a primary care patient of Kinnie [name], MD. The care management team was consulted for assistance with care management and care coordination needs.    Review of patient status, including review of consultants reports, relevant laboratory and other test results, and collaboration with appropriate care team members and the patient's provider was performed as part of comprehensive patient evaluation and provision of chronic care management services.    SDOH (Social Determinants of Health) assessments performed: No See Care Plan activities for detailed interventions related to [facility])     Advanced Directives: See Care Plan and Vynca application for related entries.   Goals Addressed              This Visit's Progress     "My blood pressures are doing ok" (pt-stated)        Current Barriers:   Chronic Disease Management support, education, and care coordination needs related to HTN  Clinical Goal(s) related to HTN:  Over the next 90 days, patient will:   Work with the care management team to address educational, disease management, and care coordination needs   Begin or continue self health monitoring activities as directed today Call provider office for new or worsened signs and symptoms   Call care management team with questions or concerns  Verbalize basic understanding of patient centered plan of care established today  Interventions related to HTN:   Evaluation of current treatment plans and patient's adherence to plan as established by provider  Assessed patient understanding of disease states  Assessed patient's education and care coordination needs  Provided disease specific education to patient     Collaborated with appropriate clinical care team members regarding patient needs  06/10/20  Patient states that her BP is doing well.  She the last time she had her BP taken it was on the 22nd and it was 136/72.  She states that she is suppose to have the LEEP procedure on the 28th and she is nervous about it  She states that her breathing has been doing well.  She get short of breath with exertion.  Advised the patient to stay out of the heat and to wear a mask due to pollen.   Patient Self Care Activities related to HTN:   Patient is unable to independently self-manage chronic health conditions  Please see past updates related to this goal by clicking on the "Past Updates" button in the selected goal          The care management team will reach out to the patient again over the next 14 days.  The patient has been provided with contact information for the care management team and has been advised to call with any health related questions or concerns.   Lazaro Arms RN, BSN, [facility] Care Management Coordinator [place] Phone: [phone] Fax: [phone]

## 2020-06-10 NOTE — Patient Instructions (Signed)
Visit Information  Goals Addressed              This Visit's Progress   .  "My blood pressures are doing ok" (pt-stated)        Current Barriers:  . Chronic Disease Management support, education, and care coordination needs related to HTN  Clinical Goal(s) related to HTN:  Over the next 90 days, patient will:  . Work with the care management team to address educational, disease management, and care coordination needs  . Begin or continue self health monitoring activities as directed today Call provider office for new or worsened signs and symptoms  . Call care management team with questions or concerns . Verbalize basic understanding of patient centered plan of care established today  Interventions related to HTN:  . Evaluation of current treatment plans and patient's adherence to plan as established by provider . Assessed patient understanding of disease states . Assessed patient's education and care coordination needs . Provided disease specific education to patient  . Collaborated with appropriate clinical care team members regarding patient needs . 06/10/20 . Patient states that her BP is doing well.  She the last time she had her BP taken it was on the 22nd and it was 136/72. . She states that she is suppose to have the LEEP procedure on the 28th and she is nervous about it . She states that her breathing has been doing well.  She get short of breath with exertion.  Advised the patient to stay out of the heat and to wear a mask due to pollen.   Patient Self Care Activities related to HTN:  . Patient is unable to independently self-manage chronic health conditions  Please see past updates related to this goal by clicking on the "Past Updates" button in the selected goal         Ms. Cowens was given information about Care Management services today including:  1. Care Management services include personalized support from designated clinical staff supervised by her  physician, including individualized plan of care and coordination with other care providers 2. 24/7 contact phone numbers for assistance for urgent and routine care needs. 3. The patient may stop CCM services at any time (effective at the end of the month) by phone call to the office staff.  Patient agreed to services and verbal consent obtained.   The patient verbalized understanding of instructions provided today and declined a print copy of patient instruction materials.   The care management team will reach out to the patient again over the next 14 days.  The patient has been provided with contact information for the care management team and has been advised to call with any health related questions or concerns.   Lazaro Arms RN, BSN, [facility] Care Management Coordinator [place] Phone: [phone] Fax: [phone]

## 2020-06-11 NOTE — Progress Notes (Signed)
Chart reviewed by anesthesia, Konrad Felix PA,  Pt meets surgery center guidelines.

## 2020-06-14 ENCOUNTER — Encounter (HOSPITAL_BASED_OUTPATIENT_CLINIC_OR_DEPARTMENT_OTHER)
Admission: RE | Disposition: A | Discharge status: Home / Self Care | Payer: Self-pay | Source: Home / Self Care | Attending: Obstetrics and Gynecology

## 2020-06-14 ENCOUNTER — Other Ambulatory Visit: Payer: Self-pay

## 2020-06-14 ENCOUNTER — Ambulatory Visit (HOSPITAL_BASED_OUTPATIENT_CLINIC_OR_DEPARTMENT_OTHER): Payer: Medicare Other | Admitting: Physician Assistant

## 2020-06-14 ENCOUNTER — Ambulatory Visit (HOSPITAL_BASED_OUTPATIENT_CLINIC_OR_DEPARTMENT_OTHER)
Admission: RE | Admit: 2020-06-14 | Discharge: 2020-06-14 | Disposition: A | Discharge status: Home / Self Care | Payer: Medicare Other | Attending: Obstetrics and Gynecology | Admitting: Obstetrics and Gynecology

## 2020-06-14 ENCOUNTER — Encounter (HOSPITAL_BASED_OUTPATIENT_CLINIC_OR_DEPARTMENT_OTHER): Payer: Self-pay | Admitting: Obstetrics and Gynecology

## 2020-06-14 DIAGNOSIS — M436 Torticollis: Secondary | ICD-10-CM | POA: Insufficient documentation

## 2020-06-14 DIAGNOSIS — R87618 Other abnormal cytological findings on specimens from cervix uteri: Secondary | ICD-10-CM | POA: Diagnosis present

## 2020-06-14 DIAGNOSIS — Z8249 Family history of ischemic heart disease and other diseases of the circulatory system: Secondary | ICD-10-CM | POA: Insufficient documentation

## 2020-06-14 DIAGNOSIS — Z791 Long term (current) use of non-steroidal anti-inflammatories (NSAID): Secondary | ICD-10-CM | POA: Insufficient documentation

## 2020-06-14 DIAGNOSIS — E669 Obesity, unspecified: Secondary | ICD-10-CM | POA: Insufficient documentation

## 2020-06-14 DIAGNOSIS — J4540 Moderate persistent asthma, uncomplicated: Secondary | ICD-10-CM | POA: Diagnosis not present

## 2020-06-14 DIAGNOSIS — I509 Heart failure, unspecified: Secondary | ICD-10-CM | POA: Diagnosis not present

## 2020-06-14 DIAGNOSIS — Z6832 Body mass index (BMI) 32.0-32.9, adult: Secondary | ICD-10-CM | POA: Insufficient documentation

## 2020-06-14 DIAGNOSIS — X58XXXA Exposure to other specified factors, initial encounter: Secondary | ICD-10-CM | POA: Diagnosis not present

## 2020-06-14 DIAGNOSIS — R0602 Shortness of breath: Secondary | ICD-10-CM | POA: Diagnosis not present

## 2020-06-14 DIAGNOSIS — Z981 Arthrodesis status: Secondary | ICD-10-CM | POA: Insufficient documentation

## 2020-06-14 DIAGNOSIS — F329 Major depressive disorder, single episode, unspecified: Secondary | ICD-10-CM | POA: Diagnosis not present

## 2020-06-14 DIAGNOSIS — B977 Papillomavirus as the cause of diseases classified elsewhere: Secondary | ICD-10-CM | POA: Diagnosis not present

## 2020-06-14 DIAGNOSIS — G4700 Insomnia, unspecified: Secondary | ICD-10-CM | POA: Diagnosis not present

## 2020-06-14 DIAGNOSIS — N870 Mild cervical dysplasia: Secondary | ICD-10-CM | POA: Insufficient documentation

## 2020-06-14 DIAGNOSIS — Z888 Allergy status to other drugs, medicaments and biological substances status: Secondary | ICD-10-CM | POA: Insufficient documentation

## 2020-06-14 DIAGNOSIS — Z7951 Long term (current) use of inhaled steroids: Secondary | ICD-10-CM | POA: Diagnosis not present

## 2020-06-14 DIAGNOSIS — I110 Hypertensive heart disease with heart failure: Secondary | ICD-10-CM | POA: Insufficient documentation

## 2020-06-14 DIAGNOSIS — R519 Headache, unspecified: Secondary | ICD-10-CM | POA: Diagnosis not present

## 2020-06-14 DIAGNOSIS — Z79899 Other long term (current) drug therapy: Secondary | ICD-10-CM | POA: Insufficient documentation

## 2020-06-14 DIAGNOSIS — F4310 Post-traumatic stress disorder, unspecified: Secondary | ICD-10-CM | POA: Insufficient documentation

## 2020-06-14 DIAGNOSIS — Z87891 Personal history of nicotine dependence: Secondary | ICD-10-CM | POA: Diagnosis not present

## 2020-06-14 DIAGNOSIS — F419 Anxiety disorder, unspecified: Secondary | ICD-10-CM | POA: Diagnosis not present

## 2020-06-14 DIAGNOSIS — D649 Anemia, unspecified: Secondary | ICD-10-CM | POA: Diagnosis not present

## 2020-06-14 DIAGNOSIS — Z825 Family history of asthma and other chronic lower respiratory diseases: Secondary | ICD-10-CM | POA: Insufficient documentation

## 2020-06-14 DIAGNOSIS — G709 Myoneural disorder, unspecified: Secondary | ICD-10-CM | POA: Insufficient documentation

## 2020-06-14 DIAGNOSIS — N393 Stress incontinence (female) (male): Secondary | ICD-10-CM | POA: Diagnosis not present

## 2020-06-14 DIAGNOSIS — R269 Unspecified abnormalities of gait and mobility: Secondary | ICD-10-CM | POA: Diagnosis not present

## 2020-06-14 DIAGNOSIS — R87611 Atypical squamous cells cannot exclude high grade squamous intraepithelial lesion on cytologic smear of cervix (ASC-H): Secondary | ICD-10-CM

## 2020-06-14 HISTORY — DX: Heart failure, unspecified: I50.9

## 2020-06-14 HISTORY — PX: LEEP: SHX91

## 2020-06-14 HISTORY — PX: COLPOSCOPY: SHX161

## 2020-06-14 HISTORY — DX: Nonrheumatic aortic (valve) insufficiency: I35.1

## 2020-06-14 HISTORY — DX: Localized edema: R60.0

## 2020-06-14 LAB — POCT I-STAT, CHEM 8
BUN: 19 mg/dL (ref 6–20)
Calcium, Ion: 1.23 mmol/L (ref 1.15–1.40)
Chloride: 102 mmol/L (ref 98–111)
Creatinine, Ser: 0.9 mg/dL (ref 0.44–1.00)
Glucose, Bld: 96 mg/dL (ref 70–99)
HCT: 36 % (ref 36.0–46.0)
Hemoglobin: 12.2 g/dL (ref 12.0–15.0)
Potassium: 3.9 mmol/L (ref 3.5–5.1)
Sodium: 140 mmol/L (ref 135–145)
TCO2: 25 mmol/L (ref 22–32)

## 2020-06-14 SURGERY — LEEP (LOOP ELECTROSURGICAL EXCISION PROCEDURE)
Anesthesia: General | Site: Vagina

## 2020-06-14 MED ORDER — LIDOCAINE HCL (CARDIAC) PF 100 MG/5ML IV SOSY
PREFILLED_SYRINGE | INTRAVENOUS | Status: DC | PRN
  Administered 2020-06-14: 80 mg via INTRAVENOUS

## 2020-06-14 MED ORDER — LACTATED RINGERS IV SOLN: INTRAVENOUS | Status: DC

## 2020-06-14 MED ORDER — ACETAMINOPHEN 500 MG PO TABS
1000.0000 mg | ORAL_TABLET | ORAL | Status: AC
  Administered 2020-06-14: 650 mg via ORAL

## 2020-06-14 MED ORDER — ACETAMINOPHEN 325 MG PO TABS
ORAL_TABLET | ORAL | Status: AC
  Filled 2020-06-14: qty 2

## 2020-06-14 MED ORDER — DEXAMETHASONE SODIUM PHOSPHATE 4 MG/ML IJ SOLN
INTRAMUSCULAR | Status: DC | PRN
  Administered 2020-06-14: 5 mg via INTRAVENOUS

## 2020-06-14 MED ORDER — MIDAZOLAM HCL 5 MG/5ML IJ SOLN
INTRAMUSCULAR | Status: DC | PRN
  Administered 2020-06-14: 2 mg via INTRAVENOUS

## 2020-06-14 MED ORDER — ACETIC ACID 4% SOLUTION
Status: DC | PRN
  Administered 2020-06-14: 1 via TOPICAL

## 2020-06-14 MED ORDER — PROPOFOL 10 MG/ML IV BOLUS
INTRAVENOUS | Status: AC
  Filled 2020-06-14: qty 20

## 2020-06-14 MED ORDER — LABETALOL HCL 5 MG/ML IV SOLN
INTRAVENOUS | Status: AC
  Filled 2020-06-14: qty 4

## 2020-06-14 MED ORDER — LABETALOL HCL 5 MG/ML IV SOLN
5.0000 mg | Freq: Once | INTRAVENOUS | Status: AC
  Administered 2020-06-14: 5 mg via INTRAVENOUS

## 2020-06-14 MED ORDER — PHENYLEPHRINE HCL (PRESSORS) 10 MG/ML IV SOLN
INTRAVENOUS | Status: DC | PRN
  Administered 2020-06-14: 80 ug via INTRAVENOUS

## 2020-06-14 MED ORDER — MIDAZOLAM HCL 2 MG/2ML IJ SOLN
INTRAMUSCULAR | Status: AC
  Filled 2020-06-14: qty 2

## 2020-06-14 MED ORDER — OXYCODONE HCL 5 MG PO TABS: 5.0000 mg | ORAL_TABLET | Freq: Once | ORAL | Status: DC | PRN

## 2020-06-14 MED ORDER — FENTANYL CITRATE (PF) 100 MCG/2ML IJ SOLN: 25.0000 ug | INTRAMUSCULAR | Status: DC | PRN

## 2020-06-14 MED ORDER — PROPOFOL 10 MG/ML IV BOLUS
INTRAVENOUS | Status: DC | PRN
  Administered 2020-06-14: 160 mg via INTRAVENOUS

## 2020-06-14 MED ORDER — IODINE STRONG (LUGOLS) 5 % PO SOLN
ORAL | Status: DC | PRN
  Administered 2020-06-14: 0.1 mL via ORAL

## 2020-06-14 MED ORDER — FENTANYL CITRATE (PF) 100 MCG/2ML IJ SOLN
INTRAMUSCULAR | Status: DC | PRN
  Administered 2020-06-14: 100 ug via INTRAVENOUS

## 2020-06-14 MED ORDER — ACETAMINOPHEN 500 MG PO TABS
ORAL_TABLET | ORAL | Status: AC
  Filled 2020-06-14: qty 2

## 2020-06-14 MED ORDER — LIDOCAINE 2% (20 MG/ML) 5 ML SYRINGE
INTRAMUSCULAR | Status: AC
  Filled 2020-06-14: qty 5

## 2020-06-14 MED ORDER — OXYCODONE HCL 5 MG/5ML PO SOLN: 5.0000 mg | Freq: Once | ORAL | Status: DC | PRN

## 2020-06-14 MED ORDER — KETOROLAC TROMETHAMINE 30 MG/ML IJ SOLN
INTRAMUSCULAR | Status: DC | PRN
  Administered 2020-06-14: 30 mg via INTRAVENOUS

## 2020-06-14 MED ORDER — LIDOCAINE-EPINEPHRINE (PF) 1 %-1:200000 IJ SOLN
INTRAMUSCULAR | Status: DC | PRN
  Administered 2020-06-14: 7 mL

## 2020-06-14 MED ORDER — FENTANYL CITRATE (PF) 100 MCG/2ML IJ SOLN
INTRAMUSCULAR | Status: AC
  Filled 2020-06-14: qty 2

## 2020-06-14 MED ORDER — MEPERIDINE HCL 25 MG/ML IJ SOLN: 6.2500 mg | INTRAMUSCULAR | Status: DC | PRN

## 2020-06-14 MED ORDER — LABETALOL HCL 5 MG/ML IV SOLN
5.0000 mg | INTRAVENOUS | Status: AC | PRN
  Administered 2020-06-14 (×2): 5 mg via INTRAVENOUS

## 2020-06-14 MED ORDER — PHENYLEPHRINE 40 MCG/ML (10ML) SYRINGE FOR IV PUSH (FOR BLOOD PRESSURE SUPPORT)
PREFILLED_SYRINGE | INTRAVENOUS | Status: AC
  Filled 2020-06-14: qty 10

## 2020-06-14 MED ORDER — FERRIC SUBSULFATE SOLN
Status: DC | PRN
  Administered 2020-06-14: 1

## 2020-06-14 SURGICAL SUPPLY — 39 items
APL SWBSTK 6 STRL LF DISP (MISCELLANEOUS) ×1
APPLICATOR COTTON TIP 6 STRL (MISCELLANEOUS) ×1 IMPLANT
APPLICATOR COTTON TIP 6IN STRL (MISCELLANEOUS) ×3
COVER WAND RF STERILE (DRAPES) ×3 IMPLANT
ELECT BALL LEEP 3MM BLK (ELECTRODE) IMPLANT
ELECT BALL LEEP 5MM RED (ELECTRODE) ×3 IMPLANT
ELECT LEEP 2.0X0.8 R2008 (MISCELLANEOUS) ×6
ELECT LOOP LEEP RND 10X10 YLW (CUTTING LOOP) ×3
ELECT LOOP LEEP RND 15X12 GRN (CUTTING LOOP)
ELECT LOOP LEEP RND 20X12 WHT (CUTTING LOOP)
ELECT LOOP LEEP SQR 10X10 ORG (CUTTING LOOP)
ELECT REM PT RETURN 9FT ADLT (ELECTROSURGICAL) ×3
ELECTRODE LEEP 2.0X0.8 R2008 (MISCELLANEOUS) ×1 IMPLANT
ELECTRODE LOOP LP RND 10X10YLW (CUTTING LOOP) ×1 IMPLANT
ELECTRODE LOOP LP RND 15X12GRN (CUTTING LOOP) IMPLANT
ELECTRODE LOOP LP RND 20X12WHT (CUTTING LOOP) IMPLANT
ELECTRODE LOOP LP SQR 10X10ORG (CUTTING LOOP) IMPLANT
ELECTRODE REM PT RTRN 9FT ADLT (ELECTROSURGICAL) ×1 IMPLANT
EXTENDER ELECT LOOP LEEP 10CM (CUTTING LOOP) IMPLANT
GLOVE BIO SURGEON STRL SZ 6.5 (GLOVE) ×1 IMPLANT
GLOVE BIO SURGEONS STRL SZ 6.5 (GLOVE) ×1
GLOVE BIOGEL PI IND STRL 6.5 (GLOVE) IMPLANT
GLOVE BIOGEL PI IND STRL 7.0 (GLOVE) ×2 IMPLANT
GLOVE BIOGEL PI IND STRL 7.5 (GLOVE) IMPLANT
GLOVE BIOGEL PI INDICATOR 6.5 (GLOVE) ×2
GLOVE BIOGEL PI INDICATOR 7.0 (GLOVE) ×4
GLOVE BIOGEL PI INDICATOR 7.5 (GLOVE) ×2
GLOVE ECLIPSE 6.5 STRL STRAW (GLOVE) ×3 IMPLANT
GOWN STRL REUS W/TWL LRG LVL3 (GOWN DISPOSABLE) ×6 IMPLANT
NS IRRIG 1000ML POUR BTL (IV SOLUTION) ×3 IMPLANT
PACK VAGINAL WOMENS (CUSTOM PROCEDURE TRAY) ×3 IMPLANT
PAD OB MATERNITY 4.3X12.25 (PERSONAL CARE ITEMS) ×3 IMPLANT
PENCIL SMOKE EVAC W/HOLSTER (ELECTROSURGICAL) ×3 IMPLANT
SCOPETTES 8  STERILE (MISCELLANEOUS) ×6
SCOPETTES 8 STERILE (MISCELLANEOUS) ×2 IMPLANT
SUT SILK 2 0 SH (SUTURE) ×3 IMPLANT
TOWEL OR 17X26 10 PK STRL BLUE (TOWEL DISPOSABLE) ×6 IMPLANT
TUBE CONNECTING 12'X1/4 (SUCTIONS) ×1
TUBE CONNECTING 12X1/4 (SUCTIONS) ×2 IMPLANT

## 2020-06-14 NOTE — Interval H&P Note (Signed)
History and Physical Interval Note:  06/14/2020 9:30 AM  Maria Griffith  has presented today for surgery, with the diagnosis of Atypical squamous cells cannot exclude high grade squamous intraepithelial lesion on cytologic smear of cervix (ASC-H), HPV infection.  The various methods of treatment have been discussed with the patient and family. After consideration of risks, benefits and other options for treatment, the patient has consented to  Procedure(s) with comments: LOOP ELECTROSURGICAL EXCISION PROCEDURE (LEEP) with ECC (N/A) - with ECC COLPOSCOPY (N/A) as a surgical intervention.  The patient's history has been reviewed, patient examined, no change in status, stable for surgery.  I have reviewed the patient's chart and labs.  Questions were answered to the patient's satisfaction.     Salvadore Dom

## 2020-06-14 NOTE — Transfer of Care (Signed)
Immediate Anesthesia Transfer of Care Note  Patient: Maria Griffith  Procedure(s) Performed: Procedure(s) (LRB): LOOP ELECTROSURGICAL EXCISION PROCEDURE (LEEP) with ECC (N/A) COLPOSCOPY (N/A)  Patient Location: PACU  Anesthesia Type: General  Level of Consciousness: awake, sedated, patient cooperative and responds to stimulation  Airway & Oxygen Therapy: Patient Spontanous Breathing and Patient connected to NC02 and soft FM   Post-op Assessment: Report given to PACU RN, Post -op Vital signs reviewed and stable and Patient moving all extremities  Post vital signs: Reviewed and stable  Complications: No apparent anesthesia complications

## 2020-06-14 NOTE — Anesthesia Postprocedure Evaluation (Signed)
Anesthesia Post Note  Patient: HEATH BADON  Procedure(s) Performed: LOOP ELECTROSURGICAL EXCISION PROCEDURE (LEEP) with ECC (N/A Vagina ) COLPOSCOPY (N/A Vagina )     Patient location during evaluation: PACU Anesthesia Type: General Level of consciousness: awake and alert and oriented Pain management: pain level controlled Vital Signs Assessment: post-procedure vital signs reviewed and stable Respiratory status: spontaneous breathing, nonlabored ventilation and respiratory function stable Cardiovascular status: blood pressure returned to baseline and stable Postop Assessment: no apparent nausea or vomiting Anesthetic complications: no   No complications documented.  Last Vitals:  Vitals:   06/14/20 1200 06/14/20 1215  BP: (!) 155/104 (!) 168/106  Pulse: 66 63  Resp: 10 11  Temp:    SpO2: 97% 97%    Last Pain:  Vitals:   06/14/20 1145  TempSrc:   PainSc: 0-No pain                 Nelly Scriven A.

## 2020-06-14 NOTE — Anesthesia Procedure Notes (Signed)
Procedure Name: LMA Insertion Date/Time: 06/14/2020 10:04 AM Performed by: Justice Rocher, CRNA Pre-anesthesia Checklist: Patient identified, Emergency Drugs available, Suction available, Patient being monitored and Timeout performed Patient Re-evaluated:Patient Re-evaluated prior to induction Oxygen Delivery Method: Circle system utilized Preoxygenation: Pre-oxygenation with 100% oxygen Induction Type: IV induction Ventilation: Mask ventilation without difficulty LMA: LMA inserted LMA Size: 4.0 Number of attempts: 1 Airway Equipment and Method: Bite block Placement Confirmation: positive ETCO2,  breath sounds checked- equal and bilateral and CO2 detector Tube secured with: Tape Dental Injury: Teeth and Oropharynx as per pre-operative assessment

## 2020-06-14 NOTE — Discharge Instructions (Signed)
  Post Anesthesia Home Care Instructions  Activity: Get plenty of rest for the remainder of the day. A responsible individual must stay with you for 24 hours following the procedure.  For the next 24 hours, DO NOT: -Drive a car -Paediatric nurse -Drink alcoholic beverages -Take any medication unless instructed by your physician -Make any legal decisions or sign important papers.  Meals: Start with liquid foods such as gelatin or soup. Progress to regular foods as tolerated. Avoid greasy, spicy, heavy foods. If nausea and/or vomiting occur, drink only clear liquids until the nausea and/or vomiting subsides. Call your physician if vomiting continues.  Special Instructions/Symptoms: Your throat may feel dry or sore from the anesthesia or the breathing tube placed in your throat during surgery. If this causes discomfort, gargle with warm salt water. The discomfort should disappear within 24 hours.  Do not take any Tylenol until after 3:00 pm today.

## 2020-06-14 NOTE — Op Note (Signed)
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   Surgeon: Dr Sumner Boast  Assistants: None  Anesthesia: General via LMA  EBL: 5 cc  Fluids: 600 cc LR  Urine output: not measured  Indications for surgery: The patient is a 59 yo female, who presented for evaluation of a ASC-H pap with positive high risk HPV. Work up included a colposcopy that was unsatisfactory. Her cervix was dilated in the office secondary to stenosis and the endocervical curettage returned with scant benign cervical cells, negative for dysplasia.  The risks of the surgery were reviewed with the patient and the consent form was signed prior to her surgery.  Findings: Unsatisfactory colposcopy  Specimens: Exocervix, endocervix, endocervical curettage.    Procedure: The patient was taken to the operating room with an IV in place. She was placed in the dorsal lithotomy position and anesthesia was administered. A speculum was placed in the vagina and lugols solution was placed on the cervix. A paracervical block was injected using 1% lidocaine with epinephrine. Under colposcopic guidance, the 2 x 0.8 cm loop was used to remove a portion of the ectocervix taking care to get the entire transformation zone.  A second 1 x 1 cm loop was used to remove a portion of the endocervix. The settings were 55 cut, 18 coag with a blend of 1.  An ECC was performed. The cautery ball was then used to cauterize the base of the biopsy site and monsels were placed. The patient tolerated the procedure well.   CC: Dr Gwendlyn Deutscher

## 2020-06-14 NOTE — Anesthesia Preprocedure Evaluation (Addendum)
Anesthesia Evaluation  Patient identified by MRN, date of birth, ID band Patient awake    Reviewed: Allergy & Precautions, NPO status , Patient's Chart, lab work & pertinent test results, reviewed documented beta blocker date and time   History of Anesthesia Complications (+) POST - OP SPINAL HEADACHE and history of anesthetic complications  Airway Mallampati: II  TM Distance: >3 FB Neck ROM: Full    Dental no notable dental hx. (+) Teeth Intact   Pulmonary shortness of breath, asthma , former smoker,    Pulmonary exam normal breath sounds clear to auscultation       Cardiovascular hypertension, Pt. on medications +CHF  Normal cardiovascular exam Rhythm:Regular Rate:Normal     Neuro/Psych  Headaches, PSYCHIATRIC DISORDERS Anxiety Depression PTSD Neuromuscular disease    GI/Hepatic negative GI ROS, Neg liver ROS,   Endo/Other  Obesity  Renal/GU negative Renal ROS Bladder dysfunction  Urinary incontinence    Musculoskeletal Torticollis    Abdominal (+) + obese,   Peds  Hematology  (+) anemia ,   Anesthesia Other Findings   Reproductive/Obstetrics HPV Abnormal Pap smear                             Anesthesia Physical Anesthesia Plan  ASA: II  Anesthesia Plan: General   Post-op Pain Management:    Induction: Intravenous  PONV Risk Score and Plan: 4 or greater and Midazolam, Treatment may vary due to age or medical condition and Dexamethasone  Airway Management Planned: LMA  Additional Equipment:   Intra-op Plan:   Post-operative Plan: Extubation in OR  Informed Consent: I have reviewed the patients History and Physical, chart, labs and discussed the procedure including the risks, benefits and alternatives for the proposed anesthesia with the patient or authorized representative who has indicated his/her understanding and acceptance.     Dental advisory given  Plan  Discussed with: CRNA and Surgeon  Anesthesia Plan Comments:       Anesthesia Quick Evaluation

## 2020-06-15 ENCOUNTER — Encounter (HOSPITAL_BASED_OUTPATIENT_CLINIC_OR_DEPARTMENT_OTHER): Payer: Self-pay | Admitting: Obstetrics and Gynecology

## 2020-06-16 LAB — SURGICAL PATHOLOGY

## 2020-06-17 ENCOUNTER — Telehealth: Payer: Self-pay | Admitting: *Deleted

## 2020-06-17 NOTE — Telephone Encounter (Signed)
Burnice Logan, RN  06/17/2020 1:20 PM EDT Back to Top    Spoke with patient, advised as seen below per Dr. Talbert Nan. Questions answered. Patient verbalizes understanding and is agreeable.   12 recall placed.   Cc: Dr. Gwendlyn Deutscher    Encounter closed.

## 2020-06-17 NOTE — Telephone Encounter (Signed)
-----   Message from Salvadore Dom, MD sent at 06/17/2020  1:01 PM EDT ----- Please let the patient know that her pathology returned with low grade dysplasia. The deep margins were negative, the superficial margins were +. There was no visible lesion on colposcopy and I burned beyond the specimen edges. I can discuss it further with her at her post op visit.  She needs another pap smear with hpv testing in one year.  CC: Dr Gwendlyn Deutscher

## 2020-06-17 NOTE — Telephone Encounter (Signed)
Burnice Logan, RN  06/17/2020 1:11 PM EDT Back to Top    Left message to call Sharee Pimple, RN at [facility].   Results faxed via Epic to PCP on file.

## 2020-06-22 ENCOUNTER — Encounter: Payer: No Typology Code available for payment source | Admitting: Physical Medicine & Rehabilitation

## 2020-06-22 ENCOUNTER — Telehealth: Payer: Self-pay | Admitting: Obstetrics and Gynecology

## 2020-06-22 NOTE — Telephone Encounter (Signed)
Patient left a message on the answering machine stating she has been bleeding and cramping. She had surgery recently.

## 2020-06-22 NOTE — Telephone Encounter (Signed)
Spoke with pt. Pt given update recommendations per Dr Talbert Nan. Pt agreeable. Pt to keep post op appt on 07/15/20. Pt agreeable.  Routing to Dr Talbert Nan for review.  Encounter closed.

## 2020-06-22 NOTE — Telephone Encounter (Signed)
Spoke with pt. Pt states having bleeding that is now spotting post op LEEP/ECC/COLPO on 06/14/20.  Pt states having quarter size clots with the bleeding from 6/30-06/19/20. Pt states now clots are gone and bleeding is lighter. Only spotting today.  Pt states changing a pad 2-3 times a day during 6/30-7/3. Denies heavy bleeding, large clots, soaking a pad, feeling lightheaded, dizzy, weak, fever or chills. Pt states also having cramping along with bleeding. Pt states taking her Rx Norco for pain, but doesn't take the cramps away. Pt rates pain as 6 on pain scale. Has been using heating pad that helps with cramping. Advised pt to use OTC Ibuprofen 800 mg every 8 hours. Pt agreeable.  Pt advised that some bleeding after LEEP is to be expected and to monitor for heavy bleeding or clots. Pt advised will update Dr Talbert Nan and return call with any additional recommendations or advice. Pt agreeable.  Pt has 4 week post op appt on 07/15/20.  Routing to Dr Talbert Nan.

## 2020-06-22 NOTE — Telephone Encounter (Signed)
I agree with your recommendations. If her bleeding becomes heavy, she develops fevers or worsening pain she should be seen. Please reassure her that infections after LEEP are very uncommon.

## 2020-06-24 ENCOUNTER — Other Ambulatory Visit: Payer: Self-pay

## 2020-06-24 ENCOUNTER — Ambulatory Visit: Payer: Medicare Other

## 2020-06-24 NOTE — Chronic Care Management (AMB) (Signed)
  Care Management   Outreach Note  06/24/2020 Name: Maria Griffith MRN: [id number] DOB: Oct 30, 1961  Referred by: Kinnie [name], MD Reason for referral : Chronic Care Management (Care Managment RNCM HTN/Asthma)   An unsuccessful telephone outreach was attempted today. The patient was referred to the case management team for assistance with care management and care coordination.   Follow Up Plan: The care management team will reach out to the patient again over the next 7-14 days.  Patient answered th ephone but was unable to talk at the moment and asked could I call her at a later time.  Lazaro Arms RN, BSN, [facility] Care Management Coordinator [place] Phone: [phone] Fax: [phone]

## 2020-06-29 ENCOUNTER — Other Ambulatory Visit: Payer: Self-pay

## 2020-06-29 ENCOUNTER — Ambulatory Visit: Payer: Medicare Other

## 2020-06-29 ENCOUNTER — Other Ambulatory Visit: Payer: Self-pay | Admitting: Family Medicine

## 2020-06-29 ENCOUNTER — Telehealth: Payer: Self-pay

## 2020-06-29 ENCOUNTER — Other Ambulatory Visit: Payer: Self-pay | Admitting: Registered Nurse

## 2020-06-29 MED ORDER — HYDROCODONE-ACETAMINOPHEN 7.5-325 MG PO TABS: ORAL_TABLET | ORAL | 0 refills | Status: DC

## 2020-06-29 NOTE — Telephone Encounter (Signed)
PMP was reviewed. Hydrocodone e-scribed. Placed a call to [name], she verbalizes understanding.

## 2020-06-29 NOTE — Chronic Care Management (AMB) (Signed)
  Care Management   Outreach Note  06/29/2020 Name: Maria Griffith MRN: [id number] DOB: [birth year]  Referred by: Kinnie [name], MD Reason for referral : Chronic Care Management (2nd attempt HTN [place])   A second unsuccessful telephone outreach was attempted today. The patient was referred to the case management team for assistance with care management and care coordination.   Follow Up Plan: A HIPPA compliant phone message was left for the patient providing contact information and requesting a return call.  The care management team will reach out to the patient again over the next 7-14 days.   Lazaro Arms RN, BSN, [facility] Care Management Coordinator [place] Phone: [phone] Fax: [phone]

## 2020-06-29 NOTE — Telephone Encounter (Signed)
Marvina Dorame called to request a refill of Hydrocodone- Acetaminophen 7.5-325 MG.   Please advise or prescribe.  Thank you

## 2020-06-29 NOTE — Addendum Note (Signed)
Addended by: Bayard Hugger on: 06/29/2020 01:52 PM   Modules accepted: Orders

## 2020-06-30 ENCOUNTER — Telehealth: Payer: Self-pay | Admitting: Registered Nurse

## 2020-06-30 MED ORDER — PREGABALIN 100 MG PO CAPS: 100.0000 mg | ORAL_CAPSULE | Freq: Three times a day (TID) | ORAL | 3 refills | Status: DC

## 2020-06-30 MED ORDER — TRAZODONE HCL 150 MG PO TABS: 150.0000 mg | ORAL_TABLET | Freq: Every day | ORAL | 2 refills | Status: DC

## 2020-06-30 NOTE — Telephone Encounter (Signed)
Lyrica and Trazodone e-scribed today.

## 2020-07-09 ENCOUNTER — Ambulatory Visit: Payer: Medicare Other

## 2020-07-09 ENCOUNTER — Other Ambulatory Visit: Payer: Self-pay

## 2020-07-09 ENCOUNTER — Encounter
Payer: No Typology Code available for payment source | Attending: Registered Nurse | Admitting: Physical Medicine & Rehabilitation

## 2020-07-09 ENCOUNTER — Encounter: Payer: Self-pay | Admitting: Physical Medicine & Rehabilitation

## 2020-07-09 VITALS — BP 138/89 | HR 107 | Temp 98.5°F | Ht 62.0 in | Wt 178.0 lb

## 2020-07-09 DIAGNOSIS — M5412 Radiculopathy, cervical region: Secondary | ICD-10-CM | POA: Insufficient documentation

## 2020-07-09 DIAGNOSIS — M961 Postlaminectomy syndrome, not elsewhere classified: Secondary | ICD-10-CM | POA: Diagnosis not present

## 2020-07-09 DIAGNOSIS — Z5181 Encounter for therapeutic drug level monitoring: Secondary | ICD-10-CM | POA: Insufficient documentation

## 2020-07-09 DIAGNOSIS — G243 Spasmodic torticollis: Secondary | ICD-10-CM | POA: Diagnosis not present

## 2020-07-09 DIAGNOSIS — F5101 Primary insomnia: Secondary | ICD-10-CM | POA: Diagnosis present

## 2020-07-09 DIAGNOSIS — M7918 Myalgia, other site: Secondary | ICD-10-CM | POA: Insufficient documentation

## 2020-07-09 DIAGNOSIS — Z79891 Long term (current) use of opiate analgesic: Secondary | ICD-10-CM | POA: Insufficient documentation

## 2020-07-09 DIAGNOSIS — M4722 Other spondylosis with radiculopathy, cervical region: Secondary | ICD-10-CM | POA: Insufficient documentation

## 2020-07-09 DIAGNOSIS — G894 Chronic pain syndrome: Secondary | ICD-10-CM | POA: Insufficient documentation

## 2020-07-09 DIAGNOSIS — M5416 Radiculopathy, lumbar region: Secondary | ICD-10-CM | POA: Insufficient documentation

## 2020-07-09 DIAGNOSIS — M542 Cervicalgia: Secondary | ICD-10-CM | POA: Insufficient documentation

## 2020-07-09 MED ORDER — HYDROCODONE-ACETAMINOPHEN 7.5-325 MG PO TABS: ORAL_TABLET | ORAL | 0 refills | Status: DC

## 2020-07-09 NOTE — Progress Notes (Signed)
   Procedure: Dysport injection Diagnosis: Cervical dystonia G 24.3 Dilution: 300 units in .6 mL sterile preservative-free normal saline  Informed consent was obtained after describing risks and benefits of the procedure with patient including bleeding bruising infection as well as the potential side effects of the medication itself. Patient elects to proceed and has given written consent.  Patient placed in a seated position Areas were marked and prepped with Betadine  Needle: 27-gauge 1 inch needle electrode connected to EMG amplifier    Left longissimus: 75 units Left splenius capitis: 150 units Left Levator scapula: 75 units  All injections done after negative drawback for blood. Appropriate EMG activity.   Nurse practitioner visit in 1 month to refill narcotic analgesic medication and check for compliance. PDMP reviewed appropriate

## 2020-07-09 NOTE — Patient Instructions (Signed)
AbobotulinumtoxinA injection What is this medicine? ABOBOTULINUMTOXINA (ay boh BOT yoo li num TOX in A) is a neuro-muscular blocker. This medicine is used to treat severe neck muscle spasms. It is also used to treat muscle spasms in the elbow, wrist, and finger muscles in adults and in the calf muscles in children. It is also used to treat frown lines or lines between the eyebrows on the face. This medicine may be used for other purposes; ask your health care provider or pharmacist if you have questions. COMMON BRAND NAME(S): Dysport What should I tell my health care provider before I take this medicine? They need to know if you have any of these conditions:  breathing problems  diabetes  heart problems  history of surgery where this medicine is going to be used  infection where this medicine is going to be used  myasthenia gravis or other neurologic disease  nerve or muscle disease  surgery plans  an unusual or allergic reaction to botulinum toxin, albumin, cow's milk protein, other medicines, foods, dyes, or preservatives  pregnant or trying to get pregnant  breast-feeding How should I use this medicine? This medicine is for injection into a muscle. It is given by a health care professional in a hospital or clinic setting. A special MedGuide will be given to you with each prescription and refill. Be sure to read this information carefully each time. Talk to your pediatrician regarding the use of this medicine in children. While this drug may be prescribed for children as young as 2 years for selected conditions, precautions to apply. Overdosage: If you think you have taken too much of this medicine contact a poison control center or emergency room at once. NOTE: This medicine is only for you. Do not share this medicine with others. What if I miss a dose? This does not apply. What may interact with this medicine?  aminoglycoside antibiotics like gentamicin, neomycin,  tobramycin  muscle relaxants  other botulinum toxin injections This list may not describe all possible interactions. Give your health care provider a list of all the medicines, herbs, non-prescription drugs, or dietary supplements you use. Also tell them if you smoke, drink alcohol, or use illegal drugs. Some items may interact with your medicine. What should I watch for while using this medicine? Visit your doctor for regular check-ups. This medicine will cause weakness in the muscle where it is injected. Tell your doctor if you feel unusually weak in other muscles. Get medical help right away if you have problems with breathing, swallowing, or talking. This medicine contains albumin from human blood. It may be possible to pass an infection in this medicine, but no cases have been reported. Talk to your doctor about the risks and benefits of this medicine. If your activities have been limited by your condition, go back to your regular routine slowly after treatment with this medicine. This medicine can make your muscles weak. And, this medicine can make your eyelids droop or make you see blurry or double. If you have weak muscles or trouble seeing do not drive a car, use machinery, or do other dangerous activities. This medicine may cause dry eyes and blurred vision. If you wear contact lenses, you may feel some discomfort. Lubricating eye drops may help. See your healthcare professional if the problem does not go away or is severe. What side effects may I notice from receiving this medicine? Side effects that you should report to your doctor or health care professional as soon as  possible:  allergic reactions like skin rash, itching or hives, swelling of the face, lips, or tongue  breathing problems  changes in vision  chest pain or tightness  eye swelling or drooping of the eyelids  fast, irregular heartbeat  loss of bladder control  numbness or weakness  signs and symptoms of  infection like fever or chills; cough; flu-like symptoms; sore throat  numbness or weakness  speech problems  swallowing problems Side effects that usually do not require medical attention (report to your doctor or health care professional if they continue or are bothersome):  bruising or pain at site where injected  dizziness  dry eyes or eye irritation  dry mouth  headache  runny nose This list may not describe all possible side effects. Call your doctor for medical advice about side effects. You may report side effects to FDA at 1-800-FDA-1088. Where should I keep my medicine? This drug is given in a hospital or clinic and will not be stored at home. NOTE: This sheet is a summary. It may not cover all possible information. If you have questions about this medicine, talk to your doctor, pharmacist, or health care provider.  2020 Elsevier/Gold Standard (2017-10-26 12:18:24)

## 2020-07-09 NOTE — Chronic Care Management (AMB) (Signed)
  Care Management   Outreach Note  07/09/2020 Name: LYSA LIVENGOOD MRN: [id number] DOB: [birth year]  Referred by: Kinnie [name], MD Reason for referral : Chronic Care Management ([facility] Asthma)   Third unsuccessful telephone outreach was attempted today. The patient was referred to the case management team for assistance with care management and care coordination. The patient's primary care provider has been notified of our unsuccessful attempts to make or maintain contact with the patient. The care management team is pleased to engage with this patient at any time in the future should he/she be interested in assistance from the care management team.   Follow Up Plan: The care management team is available to follow up with the patient after provider conversation with the patient regarding recommendation for care management engagement and subsequent re-referral to the care management team.   Lazaro Arms RN, BSN, [place] Management Coordinator [place] Phone: [phone] Fax: [phone]

## 2020-07-15 ENCOUNTER — Encounter: Payer: Self-pay | Admitting: Obstetrics and Gynecology

## 2020-07-15 ENCOUNTER — Other Ambulatory Visit: Payer: Self-pay

## 2020-07-15 ENCOUNTER — Ambulatory Visit: Payer: Medicare Other | Admitting: Obstetrics and Gynecology

## 2020-07-15 VITALS — BP 164/90 | HR 94 | Resp 14 | Ht 62.0 in | Wt 178.2 lb

## 2020-07-15 DIAGNOSIS — N760 Acute vaginitis: Secondary | ICD-10-CM | POA: Diagnosis not present

## 2020-07-15 DIAGNOSIS — Z9889 Other specified postprocedural states: Secondary | ICD-10-CM | POA: Diagnosis not present

## 2020-07-15 DIAGNOSIS — Z113 Encounter for screening for infections with a predominantly sexual mode of transmission: Secondary | ICD-10-CM

## 2020-07-15 DIAGNOSIS — R300 Dysuria: Secondary | ICD-10-CM

## 2020-07-15 DIAGNOSIS — Z8719 Personal history of other diseases of the digestive system: Secondary | ICD-10-CM

## 2020-07-15 DIAGNOSIS — R1033 Periumbilical pain: Secondary | ICD-10-CM

## 2020-07-15 LAB — POCT URINALYSIS DIPSTICK
Bilirubin, UA: NEGATIVE
Blood, UA: NEGATIVE
Glucose, UA: NEGATIVE
Ketones, UA: NEGATIVE
Leukocytes, UA: NEGATIVE
Nitrite, UA: NEGATIVE
Protein, UA: NEGATIVE
Urobilinogen, UA: 0.2 E.U./dL
pH, UA: 5 (ref 5.0–8.0)

## 2020-07-15 MED ORDER — BETAMETHASONE VALERATE 0.1 % EX OINT
1.0000 | TOPICAL_OINTMENT | Freq: Two times a day (BID) | CUTANEOUS | 0 refills | Status: DC
Start: 2020-07-15 — End: 2022-05-09
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
## 2020-07-16 LAB — HSV(HERPES SIMPLEX VRS) I + II AB-IGG
HSV 1 Glycoprotein G Ab, IgG: 26.1 index — ABNORMAL HIGH (ref 0.00–0.90)
HSV 2 IgG, Type Spec: <0.91 index (ref 0.00–0.90)

## 2020-07-16 LAB — HEP, RPR, HIV PANEL
HIV Screen 4th Generation wRfx: NONREACTIVE
Hepatitis B Surface Ag: NEGATIVE
RPR Ser Ql: NONREACTIVE

## 2020-07-16 LAB — HEPATITIS C ANTIBODY: Hep C Virus Ab: 0.3 s/co ratio (ref 0.0–0.9)

## 2020-07-16 LAB — URINALYSIS, MICROSCOPIC ONLY
Bacteria, UA: NONE SEEN
Casts: NONE SEEN /lpf
Epithelial Cells (non renal): NONE SEEN /hpf (ref 0–10)
RBC, Urine: NONE SEEN /hpf (ref 0–2)
WBC, UA: NONE SEEN /hpf (ref 0–5)

## 2020-07-17 LAB — NUSWAB VAGINITIS PLUS (VG+)
Candida albicans, NAA: NEGATIVE
Candida glabrata, NAA: NEGATIVE
Chlamydia trachomatis, NAA: NEGATIVE
Neisseria gonorrhoeae, NAA: NEGATIVE
Trich vag by NAA: NEGATIVE

## 2020-07-17 LAB — URINE CULTURE

## 2020-07-20 ENCOUNTER — Telehealth: Payer: Self-pay | Admitting: Obstetrics and Gynecology

## 2020-07-20 NOTE — Telephone Encounter (Signed)
Call placed to convey appointment details for [facility] Surgery appointment 07/30/20. Spoke with the patient she is aware of the appointment details.

## 2020-07-22 ENCOUNTER — Telehealth: Payer: Self-pay

## 2020-07-22 MED ORDER — VALACYCLOVIR HCL 500 MG PO TABS: ORAL_TABLET | ORAL | 3 refills | Status: DC

## 2020-07-22 NOTE — Telephone Encounter (Signed)
-----   Message from Salvadore Dom, MD sent at 07/21/2020  9:32 PM EDT ----- Is she having  blisters inside her mouth right now, is she having recurring blisters? This is not a new infection.  If she is having oral HSV this can be treated when she has symptoms. If she is having very frequent outbreaks she could go on suppression. I wouldn't go on suppression unless she is having a frequent problem.

## 2020-07-22 NOTE — Telephone Encounter (Signed)
Spoke with pt. Pt given results and recommendations per Dr Talbert Nan. Pt states is currently having mouth blisters that have been there about 1 week. Pt states mouth blisters have been reoccurring around every 1-2 weeks for the last 2-3 months.. Pt ok with daily suppression meds to keep from having outbreaks.  Pharmacy verified. Pt aware of HSV 1 results. Pt unaware there was medication to treat.   Routing to Dr Talbert Nan, please advise.

## 2020-07-22 NOTE — Telephone Encounter (Signed)
Will call in valtrex 500 mg po qd, can increase to 4 tablets po BID x 1 day with an outbreak. She has had some elevation of her creatinine in the past. Last Creatinine in 6/21 was 0.9. Prior creatinine in 3/21 was 1.04 with GFR of 69. No dose adjustment is needed. Please inform.

## 2020-07-23 NOTE — Telephone Encounter (Signed)
Spoke with pt. Pt given update from Dr Talbert Nan on Rx. Pt agreeable and verbalized understanding of Rx instructions. Rx sent to pharmacy on file by Dr Talbert Nan on 07/22/20. Encounter closed.

## 2020-07-27 ENCOUNTER — Telehealth: Payer: Self-pay | Admitting: Registered Nurse

## 2020-07-27 NOTE — Telephone Encounter (Signed)
Please send Escript [id number] for [employer] supplies

## 2020-07-28 ENCOUNTER — Other Ambulatory Visit: Payer: Self-pay | Admitting: *Deleted

## 2020-07-28 ENCOUNTER — Other Ambulatory Visit: Payer: Self-pay

## 2020-07-28 ENCOUNTER — Ambulatory Visit: Payer: Medicare Other | Admitting: Neurology

## 2020-07-28 MED ORDER — LIDOCAINE 5 % EX PTCH: 1.0000 | MEDICATED_PATCH | CUTANEOUS | 0 refills | Status: DC

## 2020-07-28 MED ORDER — ALBUTEROL SULFATE HFA 108 (90 BASE) MCG/ACT IN AERS: 2.0000 | INHALATION_SPRAY | Freq: Four times a day (QID) | RESPIRATORY_TRACT | 2 refills | Status: DC | PRN

## 2020-07-28 NOTE — Telephone Encounter (Signed)
Placed a call to Ms. Renato Battles for clarification regarding her telephone call, no answer. Left message to return the call.

## 2020-07-28 NOTE — Progress Notes (Signed)
Pt cx same day appt today, told phone staff she is going to call back to r/s. Received refill request by fax for her lidocaine patches from [facility]. Per Dr. Felecia Shelling, ok to call in #90, 0 refills. She will need appt for future refills beyond that.

## 2020-07-29 ENCOUNTER — Other Ambulatory Visit: Payer: Self-pay | Admitting: Family Medicine

## 2020-07-29 ENCOUNTER — Telehealth: Payer: Self-pay

## 2020-07-29 ENCOUNTER — Other Ambulatory Visit: Payer: Self-pay | Admitting: Neurology

## 2020-07-29 MED ORDER — ALBUTEROL SULFATE (2.5 MG/3ML) 0.083% IN NEBU: INHALATION_SOLUTION | RESPIRATORY_TRACT | 5 refills | Status: DC

## 2020-07-29 MED ORDER — BUDESONIDE-FORMOTEROL FUMARATE 160-4.5 MCG/ACT IN AERO: 2.0000 | INHALATION_SPRAY | Freq: Two times a day (BID) | RESPIRATORY_TRACT | 3 refills | Status: DC

## 2020-07-29 MED ORDER — OXYBUTYNIN CHLORIDE 5 MG PO TABS: 5.0000 mg | ORAL_TABLET | Freq: Two times a day (BID) | ORAL | 2 refills | Status: DC

## 2020-07-29 MED ORDER — LORATADINE 10 MG PO TABS: 10.0000 mg | ORAL_TABLET | Freq: Every day | ORAL | 2 refills | Status: DC

## 2020-07-29 MED ORDER — LIDOCAINE 5 % EX PTCH: 1.0000 | MEDICATED_PATCH | Freq: Every day | CUTANEOUS | 0 refills | Status: DC | PRN

## 2020-07-29 MED ORDER — BENZONATATE 100 MG PO CAPS: 100.0000 mg | ORAL_CAPSULE | Freq: Two times a day (BID) | ORAL | 0 refills | Status: DC | PRN

## 2020-07-29 MED ORDER — LOSARTAN POTASSIUM 100 MG PO TABS: 100.0000 mg | ORAL_TABLET | Freq: Every day | ORAL | 2 refills | Status: DC

## 2020-07-29 NOTE — Telephone Encounter (Signed)
Called to reachout regarding COVID-19 shot. She said she already got it at the pharmacy. Vaccine updated.   She requested med refilled to [facility].

## 2020-07-29 NOTE — Telephone Encounter (Signed)
Maria Griffith has requested her [employer] script to be sent to:  [employer]. For Workers Comp coverage. The phone number is [phone].  Patient advised bring the fax number and more details with her on tomorrow office visit.   Please advise.

## 2020-07-30 ENCOUNTER — Encounter: Payer: Self-pay | Admitting: Registered Nurse

## 2020-07-30 ENCOUNTER — Telehealth: Payer: Self-pay | Admitting: Registered Nurse

## 2020-07-30 ENCOUNTER — Other Ambulatory Visit: Payer: Self-pay

## 2020-07-30 ENCOUNTER — Encounter: Payer: No Typology Code available for payment source | Attending: Registered Nurse | Admitting: Registered Nurse

## 2020-07-30 VITALS — Ht 62.0 in | Wt 178.0 lb

## 2020-07-30 DIAGNOSIS — Z79891 Long term (current) use of opiate analgesic: Secondary | ICD-10-CM

## 2020-07-30 DIAGNOSIS — M79604 Pain in right leg: Secondary | ICD-10-CM

## 2020-07-30 DIAGNOSIS — G8929 Other chronic pain: Secondary | ICD-10-CM

## 2020-07-30 DIAGNOSIS — M5416 Radiculopathy, lumbar region: Secondary | ICD-10-CM

## 2020-07-30 DIAGNOSIS — F5101 Primary insomnia: Secondary | ICD-10-CM

## 2020-07-30 DIAGNOSIS — M542 Cervicalgia: Secondary | ICD-10-CM

## 2020-07-30 DIAGNOSIS — M961 Postlaminectomy syndrome, not elsewhere classified: Secondary | ICD-10-CM

## 2020-07-30 DIAGNOSIS — M5412 Radiculopathy, cervical region: Secondary | ICD-10-CM | POA: Diagnosis not present

## 2020-07-30 DIAGNOSIS — M546 Pain in thoracic spine: Secondary | ICD-10-CM

## 2020-07-30 DIAGNOSIS — M7918 Myalgia, other site: Secondary | ICD-10-CM

## 2020-07-30 DIAGNOSIS — M4722 Other spondylosis with radiculopathy, cervical region: Secondary | ICD-10-CM

## 2020-07-30 DIAGNOSIS — G894 Chronic pain syndrome: Secondary | ICD-10-CM

## 2020-07-30 DIAGNOSIS — Z5181 Encounter for therapeutic drug level monitoring: Secondary | ICD-10-CM

## 2020-07-30 MED ORDER — HYDROCODONE-ACETAMINOPHEN 7.5-325 MG PO TABS: ORAL_TABLET | ORAL | 0 refills | Status: DC

## 2020-07-30 MED ORDER — TIZANIDINE HCL 4 MG PO TABS: ORAL_TABLET | ORAL | 3 refills | Status: DC

## 2020-07-30 MED ORDER — POLYETHYLENE GLYCOL 3350 17 GM/SCOOP PO POWD: ORAL | 2 refills | Status: DC

## 2020-07-30 NOTE — Telephone Encounter (Signed)
[employer] phone number called: This division only handles medications. Per agent the adjuster will need to be contacted for such items.   Edra has been inform. She  will call her lawyer to find out the new adjusters name & phone number.

## 2020-07-30 NOTE — Telephone Encounter (Signed)
Return [name] Call: She states her Adjustor name is Engineer, civil (consulting). Letter and prescriptions will be faxed. [name] is aware of the above.
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
## 2020-08-05 ENCOUNTER — Ambulatory Visit: Payer: Medicare Other | Admitting: Neurology

## 2020-08-25 ENCOUNTER — Encounter: Payer: Self-pay | Admitting: Registered Nurse

## 2020-08-25 ENCOUNTER — Other Ambulatory Visit: Payer: Self-pay

## 2020-08-25 ENCOUNTER — Encounter: Payer: No Typology Code available for payment source | Attending: Registered Nurse | Admitting: Registered Nurse

## 2020-08-25 VITALS — BP 129/81 | HR 87 | Temp 98.4°F | Ht 62.0 in | Wt 177.0 lb

## 2020-08-25 DIAGNOSIS — M5416 Radiculopathy, lumbar region: Secondary | ICD-10-CM | POA: Insufficient documentation

## 2020-08-25 DIAGNOSIS — F5101 Primary insomnia: Secondary | ICD-10-CM | POA: Diagnosis present

## 2020-08-25 DIAGNOSIS — Z5181 Encounter for therapeutic drug level monitoring: Secondary | ICD-10-CM | POA: Diagnosis not present

## 2020-08-25 DIAGNOSIS — M542 Cervicalgia: Secondary | ICD-10-CM | POA: Diagnosis present

## 2020-08-25 DIAGNOSIS — R202 Paresthesia of skin: Secondary | ICD-10-CM

## 2020-08-25 DIAGNOSIS — M4722 Other spondylosis with radiculopathy, cervical region: Secondary | ICD-10-CM | POA: Diagnosis present

## 2020-08-25 DIAGNOSIS — M546 Pain in thoracic spine: Secondary | ICD-10-CM | POA: Diagnosis present

## 2020-08-25 DIAGNOSIS — M961 Postlaminectomy syndrome, not elsewhere classified: Secondary | ICD-10-CM | POA: Diagnosis present

## 2020-08-25 DIAGNOSIS — G894 Chronic pain syndrome: Secondary | ICD-10-CM | POA: Diagnosis not present

## 2020-08-25 DIAGNOSIS — Z79891 Long term (current) use of opiate analgesic: Secondary | ICD-10-CM | POA: Diagnosis not present

## 2020-08-25 DIAGNOSIS — M7918 Myalgia, other site: Secondary | ICD-10-CM | POA: Diagnosis present

## 2020-08-25 DIAGNOSIS — G8929 Other chronic pain: Secondary | ICD-10-CM | POA: Insufficient documentation

## 2020-08-25 DIAGNOSIS — M5412 Radiculopathy, cervical region: Secondary | ICD-10-CM | POA: Diagnosis present

## 2020-08-25 MED ORDER — HYDROCODONE-ACETAMINOPHEN 7.5-325 MG PO TABS: ORAL_TABLET | ORAL | 0 refills | Status: DC
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
## 2020-08-27 LAB — TOXASSURE SELECT,+ANTIDEPR,UR

## 2020-08-30 ENCOUNTER — Telehealth: Payer: Self-pay | Admitting: *Deleted

## 2020-08-30 NOTE — Telephone Encounter (Signed)
Urine drug screen for this encounter is consistent for prescribed medication 

## 2020-08-31 ENCOUNTER — Other Ambulatory Visit: Payer: Self-pay | Admitting: Registered Nurse

## 2020-09-23 ENCOUNTER — Encounter: Payer: Medicare Other | Admitting: Registered Nurse

## 2020-09-27 ENCOUNTER — Other Ambulatory Visit: Payer: Self-pay

## 2020-09-27 ENCOUNTER — Encounter: Payer: Self-pay | Admitting: Registered Nurse

## 2020-09-27 ENCOUNTER — Encounter: Payer: No Typology Code available for payment source | Attending: Registered Nurse | Admitting: Registered Nurse

## 2020-09-27 ENCOUNTER — Other Ambulatory Visit: Payer: Self-pay | Admitting: Registered Nurse

## 2020-09-27 VITALS — BP 176/103 | HR 61 | Temp 98.0°F | Ht 62.0 in | Wt 180.4 lb

## 2020-09-27 DIAGNOSIS — M7918 Myalgia, other site: Secondary | ICD-10-CM | POA: Diagnosis present

## 2020-09-27 DIAGNOSIS — M546 Pain in thoracic spine: Secondary | ICD-10-CM | POA: Diagnosis present

## 2020-09-27 DIAGNOSIS — G8929 Other chronic pain: Secondary | ICD-10-CM

## 2020-09-27 DIAGNOSIS — G894 Chronic pain syndrome: Secondary | ICD-10-CM

## 2020-09-27 DIAGNOSIS — M5412 Radiculopathy, cervical region: Secondary | ICD-10-CM

## 2020-09-27 DIAGNOSIS — M5416 Radiculopathy, lumbar region: Secondary | ICD-10-CM

## 2020-09-27 DIAGNOSIS — M961 Postlaminectomy syndrome, not elsewhere classified: Secondary | ICD-10-CM

## 2020-09-27 DIAGNOSIS — Z79891 Long term (current) use of opiate analgesic: Secondary | ICD-10-CM

## 2020-09-27 DIAGNOSIS — M4722 Other spondylosis with radiculopathy, cervical region: Secondary | ICD-10-CM | POA: Diagnosis present

## 2020-09-27 DIAGNOSIS — F5101 Primary insomnia: Secondary | ICD-10-CM | POA: Diagnosis present

## 2020-09-27 DIAGNOSIS — I10 Essential (primary) hypertension: Secondary | ICD-10-CM

## 2020-09-27 DIAGNOSIS — M542 Cervicalgia: Secondary | ICD-10-CM | POA: Diagnosis present

## 2020-09-27 DIAGNOSIS — Z5181 Encounter for therapeutic drug level monitoring: Secondary | ICD-10-CM | POA: Diagnosis present

## 2020-09-27 MED ORDER — HYDROCODONE-ACETAMINOPHEN 7.5-325 MG PO TABS
ORAL_TABLET | ORAL | 0 refills | Status: DC
Start: 2020-09-27 — End: 2020-10-26

## 2020-09-27 MED ORDER — TIZANIDINE HCL 4 MG PO TABS
ORAL_TABLET | ORAL | 3 refills | Status: DC
Start: 2020-09-27 — End: 2020-12-22

## 2020-09-27 MED ORDER — TRAZODONE HCL 150 MG PO TABS
150.0000 mg | ORAL_TABLET | Freq: Every day | ORAL | 4 refills | Status: DC
Start: 2020-09-27 — End: 2021-10-31
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
## 2020-10-26 ENCOUNTER — Telehealth: Payer: Self-pay

## 2020-10-26 ENCOUNTER — Encounter: Payer: No Typology Code available for payment source | Admitting: Physical Medicine & Rehabilitation

## 2020-10-26 ENCOUNTER — Telehealth: Payer: Self-pay | Admitting: Registered Nurse

## 2020-10-26 MED ORDER — HYDROCODONE-ACETAMINOPHEN 7.5-325 MG PO TABS
ORAL_TABLET | ORAL | 0 refills | Status: DC
Start: 2020-10-26 — End: 2020-11-18

## 2020-10-26 NOTE — Telephone Encounter (Signed)
Maria Griffith cancelled her appointment today with Dr Letta Pate, she states she wasn't feeling well. Hydrocodone e-scribed today, her appointment was re-scheduled with this provider to 11/01/2020, she verbalizes understanding.

## 2020-10-26 NOTE — Telephone Encounter (Signed)
Patient needs supplies for Tens unit refilled as well.

## 2020-10-26 NOTE — Telephone Encounter (Signed)
Patient called stating she is out of her meds. Last f/u 09/27/2020, r/s botox from today to 12/2.

## 2020-10-27 ENCOUNTER — Telehealth: Payer: Self-pay | Admitting: Registered Nurse

## 2020-10-27 NOTE — Telephone Encounter (Signed)
Letter Faxed: To Workman's Compensation: Software engineer For Ms. Renato Battles [employer] and [employer] Unit [employer].   Ms. Spatz is aware of the above.

## 2020-11-01 ENCOUNTER — Encounter: Payer: No Typology Code available for payment source | Attending: Registered Nurse | Admitting: Registered Nurse

## 2020-11-01 ENCOUNTER — Encounter: Payer: Self-pay | Admitting: Registered Nurse

## 2020-11-01 ENCOUNTER — Other Ambulatory Visit: Payer: Self-pay

## 2020-11-01 VITALS — BP 139/87 | HR 80 | Temp 98.1°F | Ht 62.0 in | Wt 186.0 lb

## 2020-11-01 DIAGNOSIS — M4722 Other spondylosis with radiculopathy, cervical region: Secondary | ICD-10-CM

## 2020-11-01 DIAGNOSIS — Z79891 Long term (current) use of opiate analgesic: Secondary | ICD-10-CM | POA: Diagnosis not present

## 2020-11-01 DIAGNOSIS — Z5181 Encounter for therapeutic drug level monitoring: Secondary | ICD-10-CM | POA: Insufficient documentation

## 2020-11-01 DIAGNOSIS — F5101 Primary insomnia: Secondary | ICD-10-CM | POA: Insufficient documentation

## 2020-11-01 DIAGNOSIS — M7918 Myalgia, other site: Secondary | ICD-10-CM | POA: Insufficient documentation

## 2020-11-01 DIAGNOSIS — G8929 Other chronic pain: Secondary | ICD-10-CM | POA: Insufficient documentation

## 2020-11-01 DIAGNOSIS — M5416 Radiculopathy, lumbar region: Secondary | ICD-10-CM | POA: Diagnosis present

## 2020-11-01 DIAGNOSIS — M542 Cervicalgia: Secondary | ICD-10-CM | POA: Diagnosis present

## 2020-11-01 DIAGNOSIS — R202 Paresthesia of skin: Secondary | ICD-10-CM | POA: Insufficient documentation

## 2020-11-01 DIAGNOSIS — G894 Chronic pain syndrome: Secondary | ICD-10-CM | POA: Insufficient documentation

## 2020-11-01 DIAGNOSIS — M961 Postlaminectomy syndrome, not elsewhere classified: Secondary | ICD-10-CM | POA: Insufficient documentation

## 2020-11-01 DIAGNOSIS — M546 Pain in thoracic spine: Secondary | ICD-10-CM | POA: Insufficient documentation

## 2020-11-01 MED ORDER — SENNOSIDES-DOCUSATE SODIUM 8.6-50 MG PO TABS
1.0000 | ORAL_TABLET | Freq: Every day | ORAL | 5 refills | Status: DC
Start: 2020-11-01 — End: 2020-12-22
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
## 2020-11-07 LAB — TOXASSURE SELECT,+ANTIDEPR,UR

## 2020-11-18 ENCOUNTER — Telehealth: Payer: Self-pay

## 2020-11-18 ENCOUNTER — Other Ambulatory Visit: Payer: Self-pay | Admitting: Registered Nurse

## 2020-11-18 ENCOUNTER — Encounter: Payer: No Typology Code available for payment source | Admitting: Physical Medicine & Rehabilitation

## 2020-11-18 MED ORDER — HYDROCODONE-ACETAMINOPHEN 7.5-325 MG PO TABS
ORAL_TABLET | ORAL | 0 refills | Status: DC
Start: 2020-11-18 — End: 2020-12-22

## 2020-11-18 NOTE — Telephone Encounter (Signed)
PMP was Reviewed: Hydrocodone e-scribed today. [facility] CMA left message for [name] regarding the above. She was also instructed to call front desk to reschedule her appointment with Dr Letta Pate. She had to cancel her appointment , she called stating her daughter passed away.

## 2020-11-18 NOTE — Telephone Encounter (Signed)
[place]. Zak[place] called:   She is not able to come to her appointment today with Dr. Ella Bodo (botox injection). Because her daughter died. She wanted to know if you will send the Hydrocodone 7.5 -325 MG?  Call back phone [phone].

## 2020-11-18 NOTE — Telephone Encounter (Signed)
Refill request for Lyrica 

## 2020-11-22 ENCOUNTER — Other Ambulatory Visit: Payer: Self-pay | Admitting: Registered Nurse

## 2020-11-22 ENCOUNTER — Telehealth: Payer: Self-pay | Admitting: *Deleted

## 2020-11-22 NOTE — Telephone Encounter (Signed)
Urine drug screen for this encounter is consistent for prescribed medication 

## 2020-12-03 ENCOUNTER — Ambulatory Visit (INDEPENDENT_AMBULATORY_CARE_PROVIDER_SITE_OTHER): Payer: Medicare Other

## 2020-12-03 ENCOUNTER — Other Ambulatory Visit: Payer: Self-pay

## 2020-12-03 DIAGNOSIS — Z23 Encounter for immunization: Secondary | ICD-10-CM

## 2020-12-03 NOTE — Progress Notes (Signed)
Flu Vaccine administered LD without complication.   Patient also wanted her Booster, however is one day shy of her 6 months. Precepted with PCP who said it would be safer to wait. Patient to call me next week to schedule apt for Booster.

## 2020-12-22 ENCOUNTER — Other Ambulatory Visit: Payer: Self-pay | Admitting: Registered Nurse

## 2020-12-22 ENCOUNTER — Encounter: Payer: Self-pay | Admitting: Registered Nurse

## 2020-12-22 ENCOUNTER — Other Ambulatory Visit: Payer: Self-pay | Admitting: Physical Medicine & Rehabilitation

## 2020-12-22 ENCOUNTER — Other Ambulatory Visit: Payer: Self-pay | Admitting: Family Medicine

## 2020-12-22 ENCOUNTER — Other Ambulatory Visit: Payer: Self-pay

## 2020-12-22 ENCOUNTER — Encounter: Payer: No Typology Code available for payment source | Attending: Registered Nurse | Admitting: Registered Nurse

## 2020-12-22 VITALS — BP 179/108 | HR 82 | Temp 98.3°F | Ht 62.0 in | Wt 179.4 lb

## 2020-12-22 DIAGNOSIS — M5416 Radiculopathy, lumbar region: Secondary | ICD-10-CM | POA: Diagnosis present

## 2020-12-22 DIAGNOSIS — M4722 Other spondylosis with radiculopathy, cervical region: Secondary | ICD-10-CM

## 2020-12-22 DIAGNOSIS — M542 Cervicalgia: Secondary | ICD-10-CM | POA: Diagnosis not present

## 2020-12-22 DIAGNOSIS — M961 Postlaminectomy syndrome, not elsewhere classified: Secondary | ICD-10-CM | POA: Diagnosis not present

## 2020-12-22 DIAGNOSIS — M546 Pain in thoracic spine: Secondary | ICD-10-CM | POA: Insufficient documentation

## 2020-12-22 DIAGNOSIS — Z5181 Encounter for therapeutic drug level monitoring: Secondary | ICD-10-CM

## 2020-12-22 DIAGNOSIS — Z79891 Long term (current) use of opiate analgesic: Secondary | ICD-10-CM

## 2020-12-22 DIAGNOSIS — G894 Chronic pain syndrome: Secondary | ICD-10-CM | POA: Diagnosis present

## 2020-12-22 DIAGNOSIS — M7918 Myalgia, other site: Secondary | ICD-10-CM

## 2020-12-22 DIAGNOSIS — I10 Essential (primary) hypertension: Secondary | ICD-10-CM

## 2020-12-22 DIAGNOSIS — G8929 Other chronic pain: Secondary | ICD-10-CM | POA: Diagnosis present

## 2020-12-22 MED ORDER — HYDROCODONE-ACETAMINOPHEN 7.5-325 MG PO TABS
ORAL_TABLET | ORAL | 0 refills | Status: DC
Start: 2020-12-22 — End: 2021-01-20
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
## 2020-12-24 ENCOUNTER — Other Ambulatory Visit: Payer: Self-pay

## 2020-12-24 ENCOUNTER — Ambulatory Visit (INDEPENDENT_AMBULATORY_CARE_PROVIDER_SITE_OTHER): Payer: Medicare Other

## 2020-12-24 DIAGNOSIS — Z23 Encounter for immunization: Secondary | ICD-10-CM | POA: Diagnosis not present

## 2020-12-24 NOTE — Progress Notes (Signed)
   Covid-19 Vaccination Clinic  Name:  Maria Griffith    MRN: [id number] DOB: 31-Oct-1961  12/24/2020  [name] was observed post Covid-19 immunization for 15 minutes without incident. She was provided with Vaccine Information Sheet and instruction to access the V-Safe system.   [name] was instructed to call 911 with any severe reactions post vaccine: Marland Kitchen Difficulty breathing  . Swelling of face and throat  . A fast heartbeat  . A bad rash all over body  . Dizziness and weakness   Booster administered LD without complication.

## 2020-12-27 ENCOUNTER — Other Ambulatory Visit: Payer: Self-pay

## 2020-12-27 ENCOUNTER — Ambulatory Visit (INDEPENDENT_AMBULATORY_CARE_PROVIDER_SITE_OTHER): Payer: Medicare Other | Admitting: Family Medicine

## 2020-12-27 ENCOUNTER — Encounter: Payer: Self-pay | Admitting: Family Medicine

## 2020-12-27 ENCOUNTER — Other Ambulatory Visit (HOSPITAL_COMMUNITY)
Admission: RE | Admit: 2020-12-27 | Discharge: 2020-12-27 | Disposition: A | Discharge status: Home / Self Care | Payer: Medicare Other | Source: Ambulatory Visit | Attending: Family Medicine | Admitting: Family Medicine

## 2020-12-27 VITALS — BP 122/70 | HR 71 | Wt 178.0 lb

## 2020-12-27 DIAGNOSIS — I10 Essential (primary) hypertension: Secondary | ICD-10-CM | POA: Diagnosis not present

## 2020-12-27 DIAGNOSIS — K769 Liver disease, unspecified: Secondary | ICD-10-CM | POA: Diagnosis not present

## 2020-12-27 DIAGNOSIS — N898 Other specified noninflammatory disorders of vagina: Secondary | ICD-10-CM | POA: Insufficient documentation

## 2020-12-27 DIAGNOSIS — R87621 Atypical squamous cells cannot exclude high grade squamous intraepithelial lesion on cytologic smear of vagina (ASC-H): Secondary | ICD-10-CM

## 2020-12-27 DIAGNOSIS — I77819 Aortic ectasia, unspecified site: Secondary | ICD-10-CM | POA: Diagnosis not present

## 2020-12-27 DIAGNOSIS — K429 Umbilical hernia without obstruction or gangrene: Secondary | ICD-10-CM | POA: Diagnosis not present

## 2020-12-27 DIAGNOSIS — G5711 Meralgia paresthetica, right lower limb: Secondary | ICD-10-CM

## 2020-12-27 DIAGNOSIS — A6000 Herpesviral infection of urogenital system, unspecified: Secondary | ICD-10-CM

## 2020-12-27 DIAGNOSIS — N952 Postmenopausal atrophic vaginitis: Secondary | ICD-10-CM | POA: Insufficient documentation

## 2020-12-27 DIAGNOSIS — A609 Anogenital herpesviral infection, unspecified: Secondary | ICD-10-CM | POA: Insufficient documentation

## 2020-12-27 MED ORDER — VALACYCLOVIR HCL 500 MG PO TABS
ORAL_TABLET | ORAL | 3 refills | Status: DC
Start: 2020-12-27 — End: 2020-12-28

## 2020-12-27 MED ORDER — VALACYCLOVIR HCL 500 MG PO TABS
ORAL_TABLET | ORAL | 3 refills | Status: DC
Start: 2020-12-27 — End: 2020-12-27

## 2020-12-27 NOTE — Assessment & Plan Note (Signed)
Patient has had meralgia paresthetica for about 1 year. She is already being treated by PM&R for other msk/neurological sources of pain and is on opiates and lyrica. Unlikely etiology is from tight belts/waistline of pants, suspect weight may play a role as patient has mild obesity. Discussed continuing management with PM&R for this problem in addition to cervical myelopathy with pain regimen. Also discussed that she will likely have improvement in many musculoskeletal problems with weightloss, a long term goal. We discussed that even a 10% reduction in weight may help improve her symptoms (~15-20 lbs). Patient does believe she is capable of doing this. We discussed staying active as possible even with pain, diet mostly based on fruit and vegetables, caloric intake between 1,500-1,200 calories per day. Also offered patient referral to nutritionist and medical weight management. She declines at this time.

## 2020-12-27 NOTE — Assessment & Plan Note (Signed)
Patient reports that she has vaginal pruritus and had success with betamethasone valerate from her gynecologist, Dr. Talbert Nan. Patient has history of HSV and no signs of lichen sclerosis on exam today, but certainly has signs of estrogen-withdrawal age-related atrophy. Most likely pruritus is due to atrophic tissues, but will r/o other causes with aptima swab for candida, BV, GC/CT, trichomonas and will treat accordingly. In the absence of lichen sclerosus, I do not recommend steroid treatment of vaginal/vulvar epithelium, esp in setting of HSV. Recommend moisturizers as first line, can consider transvaginal estrogen at low doses if tx with moisturizers fails. However, since patient has established relationship with gynecologist, recommend she follow up with Dr. Talbert Nan.

## 2020-12-27 NOTE — Assessment & Plan Note (Signed)
Patient has not seen cardiology since December 2020. Last echo in June 2019. Recommend she follow up this year.

## 2020-12-27 NOTE — Assessment & Plan Note (Signed)
Patient has umbilical hernia, frequent pain due to this. Counseled extensively on how to reduce hernia, signs and sx of entrapment and obstruction. RTC precautions given. Recommend abdominal binder and referral to gen surgery. Patient wishes to wait on referral at this time, happy to place referral whenever she would like.

## 2020-12-27 NOTE — Assessment & Plan Note (Signed)
No current out break. Patient requested refills for valtrex, approved.

## 2020-12-27 NOTE — Assessment & Plan Note (Signed)
Due to labile BP and difficulty with patient taking accurate BP and reporting back, as well as reported compliance, recommend ambulatory BP cuff with pharmacy clinic, Dr. Valentina Lucks. Patient is in agreement with this. Home meds are losartan 100 mg and hydralazine 50 mg TID, an interesting combination. - scheduled for 12/30/20 at 10:30 with Dr. Valentina Lucks

## 2020-12-27 NOTE — Patient Instructions (Addendum)
It was a pleasure to see you today!  1. You have what is called an umbilical hernia, or a defect in the abdominal wall near your belly button where the bowel pokes out. If  You ever notice that you cannot get your hernia to reduce (get smaller, go back inside), you are in severe pain, and/or having nausea/vomiting, you need to go to the emergency department for immediate evaluation.  2. Hernia repairs are done by surgeons, I am happy to give you a referral if you need, just call.   3. You can try an abdominal binder to help with pain and reduce the amount of times your hernia is bothersome. Try looking at [facility] [place], [facility], [facility], etc, for an umbilical hernia abdominal binder.  4. For your leg numbness: this is something called meralgia paresthetica, or numbness of the outer thigh caused by nerve pinching/inflammation. The two most common causes are tight waist bands around your hips, or excess weight. I recommend you discuss this with the PM&R doctor. Losing even 10% of weight can be a great benefit to your symptoms. Losing weight is 90% diet, 10% exercise, but I recommend you be as active as you can. Eating between 1,500 and 1,200 calories per day focusing on mostly fresh fruits and vegetables will   5. I have refilled your valtrex and taken labs. I recommend you call your gynecologist for a refill of betamethasone valerate cream Sumner Boast, MD). I will let you know the results within the next few days.  6. Please return on January 13th at 10:30 AM for blood pressure cuff reading with Dr. Valentina Lucks here at the family medicine clinic  Be Well,  Dr. Chauncey Reading

## 2020-12-27 NOTE — Progress Notes (Signed)
SUBJECTIVE:   CHIEF COMPLAINT / HPI: leg tingling  Right leg tingling: 1 year abdomen numbness and tingling on the lateral aspect of her right leg from hip to about 4 inches above the knee.  She follows with physical medicine rehabilitation for cervical myelopathy and associated pain, well as a history of lumbar radiculopathy.  Patient is already treated with narcotic and Lyrica.  She is also use lidocaine patches for some of the tingling sensation.  Previously been diagnosed with meralgia paresthetica.  Today she was wearing high wasted leggings, she does not wear heavy belt or usually wear a tight waistline around her hips.  She is mildly obese with BMI of 32.  Patient denies shooting pain down to her knee or foot, no saddle anesthesia or incontinence.  HSV: Patient reports vaginal itching, has a h/o HSV.  She follows with Dr. Sumner Boast for gynecologic care.  She was diagnosed with HSV type I in July, wonders if she no longer has this and asks about the natural history course of the disease.  She has used Valtrex in the past with good effect.  She also would like a refill of betamethasone valerate, which Dr. Talbert Nan gave her previously for vaginal itching.  The itching has been present for a couple weeks, she denies any discharge, she would like non blood work STI testing.  Abdominal pain: Patient has had abdominal pain on and off for multiple months.  Pain comes on quickly and is sharp and intense in nature, can become aching, only improves when she presses on her belly button.  She can have a lingering ache even after pressing on her belly button remove the most intense severity of the pain.  She denies  HTN: Patient has had inconsistent BP. Today she is well controlled and at goal of <130/80. However, last week at PM&R she was uncontrolled 170s/90s. Reviewing her chart, her BP fluctuates frequently between controlled and elevated. She is presently on losartan 100 mg qd and hydralazine 50 mg  TID. She reports good compliance with this medication. She has a BP cuff at home, but forgets to check it. Previously on spironolactone, but dc'd due to hair loss per chart review (cardiology notes 11/2019).  PERTINENT  PMH / PSH: lumbar radiculopathy and cervical myelopathy (sees PM&R)  OBJECTIVE:   BP 122/70   Pulse 71   Wt 178 lb (80.7 kg)   LMP 10/22/2014 (Approximate)   SpO2 96%   BMI 32.56 kg/m   Nursing note and vitals reviewed GEN: middle-aged AAW resting comfortably in chair, NAD, mild obesity HEENT: NCAT. Sclera without injection or icterus. MMM.  Abdomen: Soft, obvious umbilical defect approx 2-3 cm, easily reducible. Normoactive bowel sounds. No tenderness to deep or light palpation. No rebound or guarding.  PELVIC:  Patient has atrophic vulvar appearance, no labial agglutination, pale vaginal epithelium, no abnormal discharge, no signs of ulcerations. Neuro: Alert and at baseline, 2+ patellar and bicep reflexes Ext: no edema Psych: Pleasant and appropriate   ASSESSMENT/PLAN:   Essential hypertension Due to labile BP and difficulty with patient taking accurate BP and reporting back, as well as reported compliance, recommend ambulatory BP cuff with pharmacy clinic, Dr. Valentina Lucks. Patient is in agreement with this. Home meds are losartan 100 mg and hydralazine 50 mg TID, an interesting combination. - scheduled for 12/30/20 at 10:30 with Dr. Valentina Lucks  Aortic dilatation [facility]) Patient has not seen cardiology since December 2020. Last echo in June 2019. Recommend she follow up this  year.  Lateral femoral cutaneous entrapment syndrome, right Patient has had meralgia paresthetica for about 1 year. She is already being treated by PM&R for other msk/neurological sources of pain and is on opiates and lyrica. Unlikely etiology is from tight belts/waistline of pants, suspect weight may play a role as patient has mild obesity. Discussed continuing management with PM&R for this problem in  addition to cervical myelopathy with pain regimen. Also discussed that she will likely have improvement in many musculoskeletal problems with weightloss, a long term goal. We discussed that even a 10% reduction in weight may help improve her symptoms (~15-20 lbs). Patient does believe she is capable of doing this. We discussed staying active as possible even with pain, diet mostly based on fruit and vegetables, caloric intake between 1,500-1,200 calories per day. Also offered patient referral to nutritionist and medical weight management. She declines at this time.  HSV (herpes simplex virus) anogenital infection No current out break. Patient requested refills for valtrex, approved.  Atrophic vaginitis Patient reports that she has vaginal pruritus and had success with betamethasone valerate from her gynecologist, Dr. Talbert Nan. Patient has history of HSV and no signs of lichen sclerosis on exam today, but certainly has signs of estrogen-withdrawal age-related atrophy. Most likely pruritus is due to atrophic tissues, but will r/o other causes with aptima swab for candida, BV, GC/CT, trichomonas and will treat accordingly. In the absence of lichen sclerosus, I do not recommend steroid treatment of vaginal/vulvar epithelium, esp in setting of HSV. Recommend moisturizers as first line, can consider transvaginal estrogen at low doses if tx with moisturizers fails. However, since patient has established relationship with gynecologist, recommend she follow up with Dr. Talbert Nan.  Umbilical hernia Patient has umbilical hernia, frequent pain due to this. Counseled extensively on how to reduce hernia, signs and sx of entrapment and obstruction. RTC precautions given. Recommend abdominal binder and referral to gen surgery. Patient wishes to wait on referral at this time, happy to place referral whenever she would like.     Gladys Damme, MD [place]

## 2020-12-28 MED ORDER — VALACYCLOVIR HCL 500 MG PO TABS: ORAL_TABLET | ORAL | 3 refills | Status: DC

## 2020-12-28 NOTE — Addendum Note (Signed)
Addended by: Talbot Grumbling on: 12/28/2020 12:06 PM   Modules accepted: Orders

## 2020-12-29 LAB — CERVICOVAGINAL ANCILLARY ONLY
Bacterial Vaginitis (gardnerella): NEGATIVE
Candida Glabrata: NEGATIVE
Candida Vaginitis: NEGATIVE
Chlamydia: NEGATIVE
Comment: NEGATIVE
Comment: NEGATIVE
Comment: NEGATIVE
Comment: NEGATIVE
Comment: NEGATIVE
Comment: NORMAL
Neisseria Gonorrhea: NEGATIVE
Trichomonas: NEGATIVE

## 2020-12-30 ENCOUNTER — Ambulatory Visit: Payer: Medicare Other | Admitting: Pharmacist

## 2020-12-30 ENCOUNTER — Telehealth: Payer: Self-pay | Admitting: Pharmacist

## 2020-12-30 NOTE — Telephone Encounter (Signed)
Contacted patient and rescheduled Amb BP Monitor for 1/18  Patient will plan to arrive at 8:30 AM

## 2020-12-31 ENCOUNTER — Ambulatory Visit: Payer: Medicare Other | Admitting: Pharmacist

## 2021-01-01 ENCOUNTER — Other Ambulatory Visit: Payer: Self-pay | Admitting: Obstetrics and Gynecology

## 2021-01-04 ENCOUNTER — Ambulatory Visit: Payer: Medicare Other | Admitting: Pharmacist

## 2021-01-04 NOTE — Telephone Encounter (Signed)
Left message to call triage at [facility], [phone].   Last OV 06/08/20  RX denied for flagyl, needs OV

## 2021-01-12 ENCOUNTER — Telehealth: Payer: Self-pay

## 2021-01-12 NOTE — Telephone Encounter (Signed)
New Worker Production assistant, radio is [place] #  [phone].  Please fax the Rx for the heat wraps & tens unit to the above fax number Att: Dijuana Hogans.  No phone number given.    Thank you.

## 2021-01-14 NOTE — Telephone Encounter (Signed)
Letter sent to Maria Griffith regarding Maria Griffith [employer].

## 2021-01-20 ENCOUNTER — Other Ambulatory Visit: Payer: Self-pay

## 2021-01-20 ENCOUNTER — Encounter: Payer: No Typology Code available for payment source | Admitting: Registered Nurse

## 2021-01-20 ENCOUNTER — Encounter: Payer: No Typology Code available for payment source | Attending: Registered Nurse | Admitting: Registered Nurse

## 2021-01-20 ENCOUNTER — Encounter: Payer: Self-pay | Admitting: Registered Nurse

## 2021-01-20 VITALS — Ht 62.0 in

## 2021-01-20 DIAGNOSIS — M542 Cervicalgia: Secondary | ICD-10-CM | POA: Insufficient documentation

## 2021-01-20 DIAGNOSIS — M4722 Other spondylosis with radiculopathy, cervical region: Secondary | ICD-10-CM | POA: Insufficient documentation

## 2021-01-20 DIAGNOSIS — M961 Postlaminectomy syndrome, not elsewhere classified: Secondary | ICD-10-CM | POA: Diagnosis not present

## 2021-01-20 DIAGNOSIS — M546 Pain in thoracic spine: Secondary | ICD-10-CM | POA: Insufficient documentation

## 2021-01-20 DIAGNOSIS — G8929 Other chronic pain: Secondary | ICD-10-CM | POA: Insufficient documentation

## 2021-01-20 DIAGNOSIS — G894 Chronic pain syndrome: Secondary | ICD-10-CM | POA: Insufficient documentation

## 2021-01-20 DIAGNOSIS — M5416 Radiculopathy, lumbar region: Secondary | ICD-10-CM | POA: Insufficient documentation

## 2021-01-20 DIAGNOSIS — M7918 Myalgia, other site: Secondary | ICD-10-CM | POA: Insufficient documentation

## 2021-01-20 DIAGNOSIS — Z5181 Encounter for therapeutic drug level monitoring: Secondary | ICD-10-CM | POA: Insufficient documentation

## 2021-01-20 DIAGNOSIS — Z79891 Long term (current) use of opiate analgesic: Secondary | ICD-10-CM | POA: Insufficient documentation

## 2021-01-20 MED ORDER — HYDROCODONE-ACETAMINOPHEN 7.5-325 MG PO TABS: ORAL_TABLET | ORAL | 0 refills | Status: DC
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
## 2021-02-14 ENCOUNTER — Other Ambulatory Visit: Payer: Self-pay | Admitting: Family Medicine

## 2021-02-17 ENCOUNTER — Encounter: Payer: No Typology Code available for payment source | Admitting: Registered Nurse

## 2021-02-24 ENCOUNTER — Encounter: Payer: No Typology Code available for payment source | Attending: Registered Nurse | Admitting: Registered Nurse

## 2021-02-24 ENCOUNTER — Encounter: Payer: Self-pay | Admitting: Registered Nurse

## 2021-02-24 ENCOUNTER — Other Ambulatory Visit: Payer: Self-pay

## 2021-02-24 VITALS — BP 183/95 | HR 93 | Temp 97.9°F | Ht 62.0 in | Wt 190.6 lb

## 2021-02-24 DIAGNOSIS — M542 Cervicalgia: Secondary | ICD-10-CM | POA: Insufficient documentation

## 2021-02-24 DIAGNOSIS — Z79891 Long term (current) use of opiate analgesic: Secondary | ICD-10-CM | POA: Diagnosis present

## 2021-02-24 DIAGNOSIS — G8929 Other chronic pain: Secondary | ICD-10-CM | POA: Insufficient documentation

## 2021-02-24 DIAGNOSIS — M4722 Other spondylosis with radiculopathy, cervical region: Secondary | ICD-10-CM | POA: Diagnosis not present

## 2021-02-24 DIAGNOSIS — M961 Postlaminectomy syndrome, not elsewhere classified: Secondary | ICD-10-CM | POA: Insufficient documentation

## 2021-02-24 DIAGNOSIS — M546 Pain in thoracic spine: Secondary | ICD-10-CM | POA: Diagnosis not present

## 2021-02-24 DIAGNOSIS — M5416 Radiculopathy, lumbar region: Secondary | ICD-10-CM | POA: Insufficient documentation

## 2021-02-24 DIAGNOSIS — Z5181 Encounter for therapeutic drug level monitoring: Secondary | ICD-10-CM | POA: Insufficient documentation

## 2021-02-24 DIAGNOSIS — M7918 Myalgia, other site: Secondary | ICD-10-CM | POA: Insufficient documentation

## 2021-02-24 DIAGNOSIS — G894 Chronic pain syndrome: Secondary | ICD-10-CM | POA: Insufficient documentation

## 2021-02-24 MED ORDER — PREGABALIN 100 MG PO CAPS
100.0000 mg | ORAL_CAPSULE | Freq: Three times a day (TID) | ORAL | 3 refills | Status: DC
Start: 2021-02-24 — End: 2021-05-31

## 2021-02-24 MED ORDER — HYDROCODONE-ACETAMINOPHEN 7.5-325 MG PO TABS: ORAL_TABLET | ORAL | 0 refills | Status: DC
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
## 2021-02-28 ENCOUNTER — Other Ambulatory Visit: Payer: Self-pay | Admitting: Obstetrics and Gynecology

## 2021-02-28 ENCOUNTER — Other Ambulatory Visit: Payer: Self-pay | Admitting: Registered Nurse

## 2021-02-28 NOTE — Telephone Encounter (Signed)
I see that she had negative vaginitis testing with her primary in 1/22. I would recommend that she use Vaseline externally, if that doesn't work she can try an over the counter steroid ointment (ie hydrocortisone ointment) for 1-2 weeks. If that doesn't work she should come for a visit.

## 2021-02-28 NOTE — Telephone Encounter (Signed)
Prescribed 07/15/20 for vulvar and vaginal itching.  "Steroid ointment for itching (short term use)."

## 2021-03-01 NOTE — Telephone Encounter (Signed)
Spoke with patient and relayed Dr. Gentry Fitz recommendations.

## 2021-03-16 ENCOUNTER — Telehealth: Payer: Self-pay | Admitting: *Deleted

## 2021-03-16 NOTE — Telephone Encounter (Signed)
Manuela Schwartz called from [employer] requesting any Rxs we have written in 2022 for Ms Eberwein. Medical Records person, Doristine Church, has been given the Rxs to fax to the office.

## 2021-03-23 ENCOUNTER — Encounter: Payer: Self-pay | Admitting: Registered Nurse

## 2021-03-23 ENCOUNTER — Other Ambulatory Visit: Payer: Self-pay

## 2021-03-23 ENCOUNTER — Encounter: Payer: No Typology Code available for payment source | Attending: Registered Nurse | Admitting: Registered Nurse

## 2021-03-23 VITALS — BP 158/89 | HR 77 | Temp 98.1°F | Ht 62.0 in | Wt 185.4 lb

## 2021-03-23 DIAGNOSIS — G8929 Other chronic pain: Secondary | ICD-10-CM | POA: Insufficient documentation

## 2021-03-23 DIAGNOSIS — M542 Cervicalgia: Secondary | ICD-10-CM | POA: Diagnosis present

## 2021-03-23 DIAGNOSIS — Z5181 Encounter for therapeutic drug level monitoring: Secondary | ICD-10-CM | POA: Diagnosis present

## 2021-03-23 DIAGNOSIS — M546 Pain in thoracic spine: Secondary | ICD-10-CM | POA: Insufficient documentation

## 2021-03-23 DIAGNOSIS — Z79891 Long term (current) use of opiate analgesic: Secondary | ICD-10-CM | POA: Diagnosis not present

## 2021-03-23 DIAGNOSIS — M4722 Other spondylosis with radiculopathy, cervical region: Secondary | ICD-10-CM | POA: Diagnosis present

## 2021-03-23 DIAGNOSIS — M961 Postlaminectomy syndrome, not elsewhere classified: Secondary | ICD-10-CM | POA: Diagnosis not present

## 2021-03-23 DIAGNOSIS — R202 Paresthesia of skin: Secondary | ICD-10-CM | POA: Insufficient documentation

## 2021-03-23 DIAGNOSIS — M5416 Radiculopathy, lumbar region: Secondary | ICD-10-CM | POA: Diagnosis present

## 2021-03-23 DIAGNOSIS — G894 Chronic pain syndrome: Secondary | ICD-10-CM | POA: Diagnosis present

## 2021-03-23 DIAGNOSIS — M7918 Myalgia, other site: Secondary | ICD-10-CM | POA: Diagnosis present

## 2021-03-23 DIAGNOSIS — M5412 Radiculopathy, cervical region: Secondary | ICD-10-CM | POA: Diagnosis present

## 2021-03-23 MED ORDER — TIZANIDINE HCL 4 MG PO TABS
4.0000 mg | ORAL_TABLET | Freq: Three times a day (TID) | ORAL | 3 refills | Status: DC
Start: 2021-03-23 — End: 2021-05-20

## 2021-03-23 MED ORDER — LIDOCAINE 5 % EX PTCH: 1.0000 | MEDICATED_PATCH | Freq: Every day | CUTANEOUS | 0 refills | Status: DC | PRN

## 2021-03-23 MED ORDER — HYDROCODONE-ACETAMINOPHEN 7.5-325 MG PO TABS: ORAL_TABLET | ORAL | 0 refills | Status: DC
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
## 2021-03-24 ENCOUNTER — Encounter: Payer: Self-pay | Admitting: Registered Nurse

## 2021-03-29 LAB — TOXASSURE SELECT,+ANTIDEPR,UR

## 2021-03-30 ENCOUNTER — Telehealth: Payer: Self-pay | Admitting: *Deleted

## 2021-03-30 NOTE — Telephone Encounter (Signed)
Urine drug screen for this encounter is consistent for prescribed medication 

## 2021-04-05 ENCOUNTER — Other Ambulatory Visit: Payer: Self-pay | Admitting: Family Medicine

## 2021-04-26 ENCOUNTER — Telehealth: Payer: Self-pay

## 2021-04-26 ENCOUNTER — Encounter: Payer: No Typology Code available for payment source | Admitting: Registered Nurse
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
## 2021-04-27 MED ORDER — HYDROCODONE-ACETAMINOPHEN 7.5-325 MG PO TABS: ORAL_TABLET | ORAL | 0 refills | Status: DC

## 2021-04-27 NOTE — Telephone Encounter (Signed)
PMP was Reviewed. Hydrocodone e-scribed today, will have staff give her a call regarding the above.

## 2021-05-06 ENCOUNTER — Encounter: Payer: Self-pay | Admitting: Registered Nurse

## 2021-05-06 ENCOUNTER — Encounter: Payer: No Typology Code available for payment source | Attending: Registered Nurse | Admitting: Registered Nurse

## 2021-05-06 ENCOUNTER — Other Ambulatory Visit: Payer: Self-pay

## 2021-05-06 VITALS — BP 145/87 | HR 73 | Temp 98.3°F | Ht 61.0 in | Wt 189.0 lb

## 2021-05-06 DIAGNOSIS — Z79891 Long term (current) use of opiate analgesic: Secondary | ICD-10-CM | POA: Diagnosis present

## 2021-05-06 DIAGNOSIS — Z5181 Encounter for therapeutic drug level monitoring: Secondary | ICD-10-CM

## 2021-05-06 DIAGNOSIS — M542 Cervicalgia: Secondary | ICD-10-CM | POA: Diagnosis present

## 2021-05-06 DIAGNOSIS — G8929 Other chronic pain: Secondary | ICD-10-CM

## 2021-05-06 DIAGNOSIS — M546 Pain in thoracic spine: Secondary | ICD-10-CM | POA: Insufficient documentation

## 2021-05-06 DIAGNOSIS — M7918 Myalgia, other site: Secondary | ICD-10-CM

## 2021-05-06 DIAGNOSIS — M961 Postlaminectomy syndrome, not elsewhere classified: Secondary | ICD-10-CM

## 2021-05-06 DIAGNOSIS — R202 Paresthesia of skin: Secondary | ICD-10-CM

## 2021-05-06 DIAGNOSIS — M4722 Other spondylosis with radiculopathy, cervical region: Secondary | ICD-10-CM

## 2021-05-06 DIAGNOSIS — M5416 Radiculopathy, lumbar region: Secondary | ICD-10-CM

## 2021-05-06 DIAGNOSIS — G894 Chronic pain syndrome: Secondary | ICD-10-CM | POA: Diagnosis present

## 2021-05-06 DIAGNOSIS — M5412 Radiculopathy, cervical region: Secondary | ICD-10-CM
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
## 2021-05-19 ENCOUNTER — Other Ambulatory Visit: Payer: Self-pay | Admitting: Registered Nurse

## 2021-05-23 ENCOUNTER — Other Ambulatory Visit: Payer: Self-pay

## 2021-05-23 MED ORDER — ALBUTEROL SULFATE HFA 108 (90 BASE) MCG/ACT IN AERS: 2.0000 | INHALATION_SPRAY | Freq: Four times a day (QID) | RESPIRATORY_TRACT | 2 refills | Status: DC | PRN

## 2021-05-23 MED ORDER — DICLOFENAC SODIUM 1 % EX GEL: CUTANEOUS | 2 refills | Status: DC

## 2021-05-30 ENCOUNTER — Other Ambulatory Visit: Payer: Self-pay | Admitting: Physician Assistant

## 2021-05-31 ENCOUNTER — Ambulatory Visit (INDEPENDENT_AMBULATORY_CARE_PROVIDER_SITE_OTHER): Payer: Medicare Other | Admitting: Student in an Organized Health Care Education/Training Program

## 2021-05-31 ENCOUNTER — Other Ambulatory Visit: Payer: Self-pay | Admitting: Physician Assistant

## 2021-05-31 ENCOUNTER — Other Ambulatory Visit: Payer: Self-pay

## 2021-05-31 ENCOUNTER — Encounter: Payer: Self-pay | Admitting: Registered Nurse

## 2021-05-31 ENCOUNTER — Ambulatory Visit: Payer: Medicare Other | Admitting: Family Medicine

## 2021-05-31 ENCOUNTER — Other Ambulatory Visit: Payer: Self-pay | Admitting: Student in an Organized Health Care Education/Training Program

## 2021-05-31 ENCOUNTER — Encounter: Payer: Self-pay | Admitting: Student in an Organized Health Care Education/Training Program

## 2021-05-31 ENCOUNTER — Encounter: Payer: No Typology Code available for payment source | Attending: Registered Nurse | Admitting: Registered Nurse

## 2021-05-31 VITALS — BP 160/101 | HR 88 | Ht 61.0 in | Wt 188.6 lb

## 2021-05-31 VITALS — BP 153/95 | HR 79 | Temp 98.2°F | Ht 61.0 in | Wt 188.6 lb

## 2021-05-31 DIAGNOSIS — G8929 Other chronic pain: Secondary | ICD-10-CM

## 2021-05-31 DIAGNOSIS — N3000 Acute cystitis without hematuria: Secondary | ICD-10-CM | POA: Diagnosis not present

## 2021-05-31 DIAGNOSIS — M5416 Radiculopathy, lumbar region: Secondary | ICD-10-CM | POA: Diagnosis present

## 2021-05-31 DIAGNOSIS — M4722 Other spondylosis with radiculopathy, cervical region: Secondary | ICD-10-CM

## 2021-05-31 DIAGNOSIS — R202 Paresthesia of skin: Secondary | ICD-10-CM

## 2021-05-31 DIAGNOSIS — M542 Cervicalgia: Secondary | ICD-10-CM

## 2021-05-31 DIAGNOSIS — Z5181 Encounter for therapeutic drug level monitoring: Secondary | ICD-10-CM

## 2021-05-31 DIAGNOSIS — G894 Chronic pain syndrome: Secondary | ICD-10-CM

## 2021-05-31 DIAGNOSIS — Z79891 Long term (current) use of opiate analgesic: Secondary | ICD-10-CM | POA: Diagnosis present

## 2021-05-31 DIAGNOSIS — M7918 Myalgia, other site: Secondary | ICD-10-CM | POA: Diagnosis present

## 2021-05-31 DIAGNOSIS — M546 Pain in thoracic spine: Secondary | ICD-10-CM | POA: Diagnosis present

## 2021-05-31 DIAGNOSIS — M961 Postlaminectomy syndrome, not elsewhere classified: Secondary | ICD-10-CM

## 2021-05-31 DIAGNOSIS — M5412 Radiculopathy, cervical region: Secondary | ICD-10-CM | POA: Diagnosis not present

## 2021-05-31 DIAGNOSIS — R300 Dysuria: Secondary | ICD-10-CM | POA: Diagnosis not present

## 2021-05-31 LAB — POCT URINALYSIS DIP (MANUAL ENTRY)
Bilirubin, UA: NEGATIVE
Glucose, UA: NEGATIVE mg/dL
Ketones, POC UA: NEGATIVE mg/dL
Nitrite, UA: POSITIVE — AB
Protein Ur, POC: 30 mg/dL — AB
Spec Grav, UA: 1.01 (ref 1.010–1.025)
Urobilinogen, UA: 0.2 E.U./dL
pH, UA: 5.5 (ref 5.0–8.0)

## 2021-05-31 LAB — POCT UA - MICROSCOPIC ONLY: WBC, Ur, HPF, POC: >20 (ref 0–5)

## 2021-05-31 MED ORDER — PREGABALIN 100 MG PO CAPS: 100.0000 mg | ORAL_CAPSULE | Freq: Three times a day (TID) | ORAL | 3 refills | Status: DC

## 2021-05-31 MED ORDER — LOSARTAN POTASSIUM 100 MG PO TABS: 1.0000 | ORAL_TABLET | Freq: Every day | ORAL | 2 refills | Status: DC

## 2021-05-31 MED ORDER — CEPHALEXIN 500 MG PO CAPS: 500.0000 mg | ORAL_CAPSULE | Freq: Three times a day (TID) | ORAL | 0 refills | Status: AC

## 2021-05-31 MED ORDER — LIDOCAINE 5 % EX PTCH: 1.0000 | MEDICATED_PATCH | Freq: Every day | CUTANEOUS | 0 refills | Status: DC | PRN

## 2021-05-31 MED ORDER — HYDROCODONE-ACETAMINOPHEN 7.5-325 MG PO TABS: ORAL_TABLET | ORAL | 0 refills | Status: DC

## 2021-05-31 MED ORDER — HYDRALAZINE HCL 50 MG PO TABS: 50.0000 mg | ORAL_TABLET | Freq: Three times a day (TID) | ORAL | 2 refills | Status: DC
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
## 2021-05-31 NOTE — Patient Instructions (Signed)
It was a pleasure to see you today!  To summarize our discussion for this visit: It looks like you have a UTI. I have sent in an antibiotic to treat- please take until completed.  Also, drink plenty of water and avoid any harsh products on your vaginal area as this can cause irritation.  Let us know if you have any further concerns. I will let you know if the culture shows the infection is not able to be treated by the antibiotic.   Some additional health maintenance measures we should update are: Health Maintenance Due  Topic Date Due   Pneumococcal Vaccine 24-2 Years old (1 - PCV) Never done   Zoster Vaccines- Shingrix (1 of 2) Never done   COVID-19 Vaccine (4 - Booster for Pfizer series) 03/24/2021   MAMMOGRAM  05/05/2021   PAP SMEAR-Modifier  04/02/2021     Please return to our clinic to see me as needed.  Call the clinic at [phone] if your symptoms worsen or you have any concerns.   Thank you for allowing me to take part in your care,  Dr. Doristine Mango

## 2021-05-31 NOTE — Progress Notes (Signed)
   SUBJECTIVE:   CHIEF COMPLAINT / HPI: dysuria  Dysuria- endorses dysuria, frequency, hesitancy since Saturday. Used azo from drug store with mild relief of symptoms. Thinks she may have had UTIs in the past but not sure.  No new activities or products used in vaginal area. Has not noticed any blood in urine.   OBJECTIVE:   BP (!) 160/101   Pulse 88   Ht 5\' 1"  (1.549 m)   Wt 188 lb 9.6 oz (85.5 kg)   LMP 10/22/2014 (Approximate)   SpO2 98%   BMI 35.64 kg/m   Physical Exam Vitals and nursing note reviewed.  Constitutional:      General: She is not in acute distress.    Appearance: She is not toxic-appearing.  Cardiovascular:     Rate and Rhythm: Normal rate.  Abdominal:     Tenderness: There is no abdominal tenderness.  Skin:    General: Skin is warm and dry.     Capillary Refill: Capillary refill takes less than 2 seconds.  Neurological:     Mental Status: She is alert and oriented to person, place, and time.  Psychiatric:        Mood and Affect: Mood normal.        Behavior: Behavior normal.     ASSESSMENT/PLAN:   UTI (urinary tract infection) Symptoms consistent with cystitis and confirmed with urinalysis/microscopy.  Sent for culture.  Prescribed keflex and recommended to drink plenty of water.  Return if not having improvement in symptoms in next few days.     [place]

## 2021-06-02 DIAGNOSIS — N390 Urinary tract infection, site not specified: Secondary | ICD-10-CM | POA: Insufficient documentation

## 2021-06-02 NOTE — Assessment & Plan Note (Signed)
Symptoms consistent with cystitis and confirmed with urinalysis/microscopy.  Sent for culture.  Prescribed keflex and recommended to drink plenty of water.  Return if not having improvement in symptoms in next few days.

## 2021-06-04 LAB — URINE CULTURE

## 2021-06-07 ENCOUNTER — Telehealth: Payer: Self-pay | Admitting: Student in an Organized Health Care Education/Training Program

## 2021-06-07 NOTE — Telephone Encounter (Signed)
Left patient voicemail regarding symptoms resolution from UTI and BP as it was elevated at last visit.

## 2021-06-28 ENCOUNTER — Other Ambulatory Visit: Payer: Self-pay

## 2021-06-28 ENCOUNTER — Encounter: Payer: Self-pay | Admitting: Registered Nurse

## 2021-06-28 ENCOUNTER — Encounter: Payer: No Typology Code available for payment source | Attending: Registered Nurse | Admitting: Registered Nurse

## 2021-06-28 ENCOUNTER — Other Ambulatory Visit: Payer: Self-pay | Admitting: Registered Nurse

## 2021-06-28 VITALS — BP 148/93 | HR 73 | Temp 98.9°F | Ht 61.0 in | Wt 190.6 lb

## 2021-06-28 DIAGNOSIS — G894 Chronic pain syndrome: Secondary | ICD-10-CM | POA: Diagnosis present

## 2021-06-28 DIAGNOSIS — M7918 Myalgia, other site: Secondary | ICD-10-CM | POA: Diagnosis present

## 2021-06-28 DIAGNOSIS — M4722 Other spondylosis with radiculopathy, cervical region: Secondary | ICD-10-CM | POA: Insufficient documentation

## 2021-06-28 DIAGNOSIS — M5416 Radiculopathy, lumbar region: Secondary | ICD-10-CM | POA: Diagnosis present

## 2021-06-28 DIAGNOSIS — M542 Cervicalgia: Secondary | ICD-10-CM | POA: Insufficient documentation

## 2021-06-28 DIAGNOSIS — M546 Pain in thoracic spine: Secondary | ICD-10-CM | POA: Diagnosis present

## 2021-06-28 DIAGNOSIS — Z5181 Encounter for therapeutic drug level monitoring: Secondary | ICD-10-CM | POA: Insufficient documentation

## 2021-06-28 DIAGNOSIS — Z79891 Long term (current) use of opiate analgesic: Secondary | ICD-10-CM | POA: Insufficient documentation

## 2021-06-28 DIAGNOSIS — R202 Paresthesia of skin: Secondary | ICD-10-CM | POA: Diagnosis present

## 2021-06-28 DIAGNOSIS — M5412 Radiculopathy, cervical region: Secondary | ICD-10-CM | POA: Insufficient documentation

## 2021-06-28 DIAGNOSIS — M961 Postlaminectomy syndrome, not elsewhere classified: Secondary | ICD-10-CM | POA: Insufficient documentation

## 2021-06-28 DIAGNOSIS — G8929 Other chronic pain: Secondary | ICD-10-CM | POA: Insufficient documentation

## 2021-06-28 MED ORDER — HYDROCODONE-ACETAMINOPHEN 7.5-325 MG PO TABS: ORAL_TABLET | ORAL | 0 refills | Status: DC
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
## 2021-07-18 ENCOUNTER — Other Ambulatory Visit: Payer: Self-pay | Admitting: Family Medicine

## 2021-07-18 DIAGNOSIS — Z1231 Encounter for screening mammogram for malignant neoplasm of breast: Secondary | ICD-10-CM

## 2021-07-19 ENCOUNTER — Other Ambulatory Visit: Payer: Self-pay

## 2021-07-19 ENCOUNTER — Ambulatory Visit (INDEPENDENT_AMBULATORY_CARE_PROVIDER_SITE_OTHER): Payer: Medicare Other | Admitting: Family Medicine

## 2021-07-19 VITALS — BP 159/99 | HR 98

## 2021-07-19 DIAGNOSIS — J069 Acute upper respiratory infection, unspecified: Secondary | ICD-10-CM

## 2021-07-19 NOTE — Patient Instructions (Signed)
It was great to see you! Thank you for allowing me to participate in your care!  Our plans for today:  -We are checking COVID-19 testing.  We should get the result back in 1 to 2 days and I will let you know the result when it returns. -If you develop any chest pains, trouble breathing, shortness of breath, vomiting to the extent that she cannot keep down fluids, or any other concerning symptoms I would like you to immediately let us know or go to the emergency department.   We are checking some labs today, I will call you if they are abnormal will send you a MyChart message or a letter if they are normal.  If you do not hear about your labs in the next 2 weeks please let us know.  Take care and seek immediate care sooner if you develop any concerns.   Dr. Lamond Glantz, DO [facility]   

## 2021-07-19 NOTE — Progress Notes (Signed)
    SUBJECTIVE:   CHIEF COMPLAINT / HPI:   Cough, sneezing, congestion: Started with runny nose and sneezing Thursday or Friday then developed sore throat and hoarseness. She has had some shortness of breath when being getting dressed but she states she has had some of that before this as well. She has had a sore throat since Sunday. She has been using her asthma inhaler. She has had chills but no fevers, no throwing up. She has had some myalgias. No diarrhea. She was around a friend from church who just tested positive for Covid19 last week just before her symptoms. She took a [phone] test at home this weekend that was negative.  PERTINENT  PMH / PSH: NA  OBJECTIVE:   BP (!) 159/99   Pulse 98   LMP 10/22/2014 (Approximate)   SpO2 96%    General: NAD, pleasant, able to participate in exam HEENT: Mild pharyngeal erythema with no ulcerations or abrasions, moist mucous membranes Cardiac: RRR, no murmurs. Respiratory: CTAB, normal effort, harsh cough on occasion during physical exam but lungs otherwise sound clear with no rales, rhonchi, or wheezing. Abdomen: Bowel sounds present, nontender Skin: warm and dry, no rashes noted  ASSESSMENT/PLAN:   Viral URI with cough: Assessment: 60 y.o. female with symptoms consistent with a viral upper respiratory tract infection.  Most likely differential includes COVID-19 as she was recently around someone from church who tested positive for COVID before her symptoms started.  She does have a history of asthma but no wheezing on exam and lungs otherwise sound clear beside her harsh cough.  She endorsed some bouts of feeling short of breath when lying down but with clear lungs and no lower limb edema I have less concern for CHF, however I did recommend that she follow-up with her primary doctor once this illness resolves if she is still having the symptoms for further evaluation. Plan: -We will send for COVID-19 testing -Discussed return  precautions -Discussed symptomatic treatment -Recommended she follow-up with her PCP when she gets over this illness if she is still having symptoms of shortness of breath when lying down.  Maria Griffith, Maria Griffith

## 2021-07-20 LAB — NOVEL CORONAVIRUS, NAA: SARS-CoV-2, NAA: NOT DETECTED

## 2021-07-20 LAB — SARS-COV-2, NAA 2 DAY TAT

## 2021-07-22 ENCOUNTER — Telehealth: Payer: Self-pay

## 2021-07-22 NOTE — Telephone Encounter (Signed)
Patient calls nurse line regarding continued cough. Patient has been using OTC Mucinex, elderberry and warm tea with lemon and honey.   Patient is requesting rx for cough medication.   Provided with ED precautions. Please advise.   Forwarding to Dr. Vanessa [place] as he saw patient for concern on 8/2.  Talbot Grumbling, RN

## 2021-07-25 ENCOUNTER — Encounter: Payer: Self-pay | Admitting: Family Medicine

## 2021-07-27 ENCOUNTER — Encounter: Payer: Self-pay | Admitting: Registered Nurse

## 2021-07-27 ENCOUNTER — Other Ambulatory Visit: Payer: Self-pay

## 2021-07-27 ENCOUNTER — Other Ambulatory Visit: Payer: Self-pay | Admitting: Registered Nurse

## 2021-07-27 ENCOUNTER — Encounter: Payer: No Typology Code available for payment source | Attending: Registered Nurse | Admitting: Registered Nurse

## 2021-07-27 VITALS — BP 159/99 | Ht 61.0 in | Wt 190.0 lb

## 2021-07-27 DIAGNOSIS — Z5181 Encounter for therapeutic drug level monitoring: Secondary | ICD-10-CM | POA: Diagnosis not present

## 2021-07-27 DIAGNOSIS — G8929 Other chronic pain: Secondary | ICD-10-CM

## 2021-07-27 DIAGNOSIS — G894 Chronic pain syndrome: Secondary | ICD-10-CM | POA: Diagnosis not present

## 2021-07-27 DIAGNOSIS — M5412 Radiculopathy, cervical region: Secondary | ICD-10-CM

## 2021-07-27 DIAGNOSIS — Z79891 Long term (current) use of opiate analgesic: Secondary | ICD-10-CM | POA: Diagnosis not present

## 2021-07-27 DIAGNOSIS — M542 Cervicalgia: Secondary | ICD-10-CM

## 2021-07-27 DIAGNOSIS — M5416 Radiculopathy, lumbar region: Secondary | ICD-10-CM

## 2021-07-27 DIAGNOSIS — M7918 Myalgia, other site: Secondary | ICD-10-CM

## 2021-07-27 DIAGNOSIS — M961 Postlaminectomy syndrome, not elsewhere classified: Secondary | ICD-10-CM

## 2021-07-27 DIAGNOSIS — M4722 Other spondylosis with radiculopathy, cervical region: Secondary | ICD-10-CM

## 2021-07-27 DIAGNOSIS — R202 Paresthesia of skin: Secondary | ICD-10-CM

## 2021-07-27 DIAGNOSIS — M546 Pain in thoracic spine: Secondary | ICD-10-CM

## 2021-07-27 MED ORDER — HYDROCODONE-ACETAMINOPHEN 7.5-325 MG PO TABS: ORAL_TABLET | ORAL | 0 refills | Status: DC
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
## 2021-07-28 ENCOUNTER — Telehealth: Payer: Self-pay | Admitting: *Deleted

## 2021-07-28 NOTE — Telephone Encounter (Signed)
Patient called c/o vaginal itching, and burning and irritation. Has annual exam scheduled on 08/15/21, I advised patient she will need a office visit for complaint, but no appointments this week. Could check for next week, patient declined OV for next week. Asked what can she use OTC to help with itching, I told her if she suspect yeast could try monistat,however I cannot confirm she has yeast via phone. Patient verbalized she understood.

## 2021-07-29 NOTE — Telephone Encounter (Signed)
PMP was Reviewed.  Lyrica e-scribed today.  

## 2021-08-03 ENCOUNTER — Other Ambulatory Visit: Payer: Self-pay | Admitting: Family Medicine

## 2021-08-12 ENCOUNTER — Ambulatory Visit: Payer: Medicare Other | Admitting: Obstetrics and Gynecology

## 2021-08-12 DIAGNOSIS — Z0289 Encounter for other administrative examinations: Secondary | ICD-10-CM
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
## 2021-08-26 ENCOUNTER — Telehealth: Payer: Self-pay

## 2021-08-26 ENCOUNTER — Encounter: Payer: No Typology Code available for payment source | Admitting: Registered Nurse

## 2021-08-26 MED ORDER — HYDROCODONE-ACETAMINOPHEN 7.5-325 MG PO TABS: ORAL_TABLET | ORAL | 0 refills | Status: DC

## 2021-08-26 NOTE — Telephone Encounter (Signed)
Patient had to cancel appt today, she is on her way to the ED with her granddaughter, possible broken arm. She states she has #10 pills left. She will call back to reschedule appt.

## 2021-08-26 NOTE — Telephone Encounter (Signed)
PMP was Reviewed  Hydrocodone e-scribed today.  Maria Griffith hd to take her granddaughter to the ED.  She was instructed to call office next week to re-schedule her appointment, she verbalizes understanding.

## 2021-09-06 ENCOUNTER — Ambulatory Visit
Admission: RE | Admit: 2021-09-06 | Discharge: 2021-09-06 | Disposition: A | Discharge status: Home / Self Care | Payer: Medicare Other | Source: Ambulatory Visit | Attending: Family Medicine | Admitting: Family Medicine

## 2021-09-06 ENCOUNTER — Other Ambulatory Visit: Payer: Self-pay

## 2021-09-06 DIAGNOSIS — Z1231 Encounter for screening mammogram for malignant neoplasm of breast: Secondary | ICD-10-CM

## 2021-09-23 ENCOUNTER — Telehealth: Payer: Self-pay | Admitting: Registered Nurse

## 2021-09-23 ENCOUNTER — Telehealth: Payer: Self-pay | Admitting: Physician Assistant

## 2021-09-23 ENCOUNTER — Telehealth: Payer: Self-pay

## 2021-09-23 MED ORDER — HYDROCODONE-ACETAMINOPHEN 7.5-325 MG PO TABS: ORAL_TABLET | ORAL | 0 refills | Status: DC

## 2021-09-23 MED ORDER — PREGABALIN 100 MG PO CAPS: 100.0000 mg | ORAL_CAPSULE | Freq: Three times a day (TID) | ORAL | 3 refills | Status: DC

## 2021-09-23 NOTE — Telephone Encounter (Signed)
Spoke with pt and has noted increasingly  SOB  Pt states does have asthma not sure if that is the culprit  does note can take inhaler with some improvement Pt is over due for appt  last echo shows Aortic valve: There was mild regurgitation.  Sure if this has worsened Appt made with Dr Julieanne Manson for Monday 09/26/21 at 11:30 Will forward to [facility] to review ./cy

## 2021-09-23 NOTE — Telephone Encounter (Signed)
Pt c/o Shortness Of Breath: STAT if SOB developed within the last 24 hours or pt is noticeably SOB on the phone  1. Are you currently SOB (can you hear that pt is SOB on the phone)? no  2. How long have you been experiencing SOB? gotten worse in the last month maybe  3. Are you SOB when sitting or when up moving around? Hard to breath when laying down and when moving around and singing  4. Are you currently experiencing any other symptoms? Headaches   Patient states over the last month she has noticed an increase in SOB. She says she has a hard time breathing when she lays down and gets SOB when she moves around or sings. Patient did not sound SOB on the phone. She states she does have asthma.  Patient states she has also been trying to put something warm on her neck for nerve damage and has had a headache.

## 2021-09-23 NOTE — Telephone Encounter (Signed)
Return [name] call, Hydrocodone e-scribed. She has an appointment on 09/26/2021, she verbalizes understanding.

## 2021-09-23 NOTE — Telephone Encounter (Signed)
Patient requesting refill on Hydrocodone or enough to get her to her appt on Monday 09/26/21. She missed last appt due to a family emergency out of town. She also needs prescription for heating wraps for back and neck.

## 2021-09-23 NOTE — Telephone Encounter (Signed)
Patient called to say she will be out of medication before the appt that we set up for her this coming Monday.  She would like for [facility] to call her.  Patient did not have an appt, so I made her one for Monday, but then she called back and said she will not have any for Sunday.  Zella Ball also, she was saying something about those items t that you do for her, but if you call her she will tell you what she needs.

## 2021-09-24 ENCOUNTER — Other Ambulatory Visit: Payer: Self-pay | Admitting: Registered Nurse
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
## 2021-09-26 ENCOUNTER — Encounter: Payer: Self-pay | Admitting: Registered Nurse

## 2021-09-26 ENCOUNTER — Encounter: Payer: Medicare Other | Admitting: Registered Nurse

## 2021-09-26 ENCOUNTER — Ambulatory Visit (INDEPENDENT_AMBULATORY_CARE_PROVIDER_SITE_OTHER): Payer: Medicare Other | Admitting: Internal Medicine

## 2021-09-26 ENCOUNTER — Ambulatory Visit: Payer: Medicare Other | Admitting: Internal Medicine

## 2021-09-26 ENCOUNTER — Encounter: Payer: No Typology Code available for payment source | Attending: Registered Nurse | Admitting: Registered Nurse

## 2021-09-26 ENCOUNTER — Other Ambulatory Visit: Payer: Self-pay

## 2021-09-26 ENCOUNTER — Encounter: Payer: Self-pay | Admitting: Internal Medicine

## 2021-09-26 VITALS — BP 134/88 | HR 83 | Ht 61.0 in | Wt 192.0 lb

## 2021-09-26 VITALS — BP 152/88 | HR 88 | Ht 61.0 in | Wt 192.2 lb

## 2021-09-26 DIAGNOSIS — M7918 Myalgia, other site: Secondary | ICD-10-CM | POA: Diagnosis present

## 2021-09-26 DIAGNOSIS — M542 Cervicalgia: Secondary | ICD-10-CM | POA: Diagnosis present

## 2021-09-26 DIAGNOSIS — I351 Nonrheumatic aortic (valve) insufficiency: Secondary | ICD-10-CM | POA: Diagnosis not present

## 2021-09-26 DIAGNOSIS — Z79891 Long term (current) use of opiate analgesic: Secondary | ICD-10-CM | POA: Diagnosis not present

## 2021-09-26 DIAGNOSIS — M546 Pain in thoracic spine: Secondary | ICD-10-CM | POA: Diagnosis present

## 2021-09-26 DIAGNOSIS — Z5181 Encounter for therapeutic drug level monitoring: Secondary | ICD-10-CM

## 2021-09-26 DIAGNOSIS — M5416 Radiculopathy, lumbar region: Secondary | ICD-10-CM

## 2021-09-26 DIAGNOSIS — M961 Postlaminectomy syndrome, not elsewhere classified: Secondary | ICD-10-CM | POA: Diagnosis present

## 2021-09-26 DIAGNOSIS — M5412 Radiculopathy, cervical region: Secondary | ICD-10-CM | POA: Diagnosis present

## 2021-09-26 DIAGNOSIS — G894 Chronic pain syndrome: Secondary | ICD-10-CM

## 2021-09-26 DIAGNOSIS — G8929 Other chronic pain: Secondary | ICD-10-CM

## 2021-09-26 DIAGNOSIS — M4722 Other spondylosis with radiculopathy, cervical region: Secondary | ICD-10-CM | POA: Diagnosis present

## 2021-09-26 DIAGNOSIS — R202 Paresthesia of skin: Secondary | ICD-10-CM | POA: Diagnosis present

## 2021-09-26 DIAGNOSIS — I5030 Unspecified diastolic (congestive) heart failure: Secondary | ICD-10-CM

## 2021-09-26 NOTE — Patient Instructions (Signed)
Medication Instructions:  Your physician recommends that you continue on your current medications as directed. Please refer to the Current Medication list given to you today.  *If you need a refill on your cardiac medications before your next appointment, please call your pharmacy*   Lab Work: TODAY: BNP, BMP, Mg If you have labs (blood work) drawn today and your tests are completely normal, you will receive your results only by: [place] (if you have MyChart) OR A paper copy in the mail If you have any lab test that is abnormal or we need to change your treatment, we will call you to review the results.   Testing/Procedures: Your physician has requested that you have an echocardiogram. Echocardiography is a painless test that uses sound waves to create images of your heart. It provides your doctor with information about the size and shape of your heart and how well your heart's chambers and valves are working. This procedure takes approximately one hour. There are no restrictions for this procedure.    Follow-Up: At [facility], you and your health needs are our priority.  As part of our continuing mission to provide you with exceptional heart care, we have created designated Provider Care Teams.  These Care Teams include your primary Cardiologist (physician) and Advanced Practice Providers (APPs -  Physician Assistants and Nurse Practitioners) who all work together to provide you with the care you need, when you need it.  Your next appointment:   12/27/21 at 11:15 am with Estella Husk
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
## 2021-09-27 ENCOUNTER — Telehealth: Payer: Self-pay | Admitting: *Deleted

## 2021-09-27 LAB — BASIC METABOLIC PANEL
BUN/Creatinine Ratio: 12 (ref 9–23)
BUN: 13 mg/dL (ref 6–24)
CO2: 25 mmol/L (ref 20–29)
Calcium: 9.6 mg/dL (ref 8.7–10.2)
Chloride: 103 mmol/L (ref 96–106)
Creatinine, Ser: 1.1 mg/dL — ABNORMAL HIGH (ref 0.57–1.00)
Glucose: 88 mg/dL (ref 70–99)
Potassium: 4.8 mmol/L (ref 3.5–5.2)
Sodium: 140 mmol/L (ref 134–144)
eGFR: 58 mL/min/{1.73_m2} — ABNORMAL LOW (ref >59)

## 2021-09-27 LAB — MAGNESIUM: Magnesium: 2.2 mg/dL (ref 1.6–2.3)

## 2021-09-27 LAB — PRO B NATRIURETIC PEPTIDE: NT-Pro BNP: 248 pg/mL (ref 0–287)

## 2021-09-27 MED ORDER — FUROSEMIDE 20 MG PO TABS: 20.0000 mg | ORAL_TABLET | Freq: Every day | ORAL | 2 refills | Status: DC | PRN

## 2021-09-27 NOTE — Telephone Encounter (Signed)
-----   Message from Werner Lean, MD sent at 09/27/2021  9:21 AM EDT ----- Results: BNP WNL with improved kidney function from prior Plan: Lasix 20 mg PO Daily PRN leg swelling If needing daily lasix, will need follow up BMP and Mg  Werner Lean, MD

## 2021-09-27 NOTE — Telephone Encounter (Signed)
Left message to call office

## 2021-09-27 NOTE — Telephone Encounter (Signed)
Patient returned call for test results.  °

## 2021-09-27 NOTE — Telephone Encounter (Signed)
Patient notified.  She will let us know if she has to take furosemide daily.  Prescription sent to [facility]

## 2021-10-03 ENCOUNTER — Telehealth: Payer: Self-pay | Admitting: *Deleted

## 2021-10-03 LAB — TOXASSURE SELECT,+ANTIDEPR,UR

## 2021-10-03 NOTE — Telephone Encounter (Signed)
Urine drug screen for this encounter is consistent for prescribed medication 

## 2021-10-10 ENCOUNTER — Ambulatory Visit (HOSPITAL_COMMUNITY): Payer: Medicare Other | Attending: Internal Medicine

## 2021-10-10 ENCOUNTER — Other Ambulatory Visit: Payer: Self-pay

## 2021-10-10 ENCOUNTER — Other Ambulatory Visit: Payer: Self-pay | Admitting: *Deleted

## 2021-10-10 ENCOUNTER — Ambulatory Visit (HOSPITAL_COMMUNITY): Payer: Medicare Other | Attending: Cardiovascular Disease

## 2021-10-10 ENCOUNTER — Encounter (HOSPITAL_COMMUNITY): Payer: Self-pay

## 2021-10-10 ENCOUNTER — Ambulatory Visit: Payer: Medicare Other | Admitting: Registered Nurse

## 2021-10-10 ENCOUNTER — Encounter: Payer: Self-pay | Admitting: *Deleted

## 2021-10-10 DIAGNOSIS — I351 Nonrheumatic aortic (valve) insufficiency: Secondary | ICD-10-CM | POA: Diagnosis not present

## 2021-10-10 DIAGNOSIS — I5030 Unspecified diastolic (congestive) heart failure: Secondary | ICD-10-CM | POA: Diagnosis not present

## 2021-10-10 LAB — ECHOCARDIOGRAM COMPLETE
AR max vel: 3.41 cm2
AV Area VTI: 3.09 cm2
AV Area mean vel: 3.09 cm2
AV Mean grad: 4 mmHg
AV Peak grad: 7.5 mmHg
Ao pk vel: 1.37 m/s
Area-P 1/2: 3.92 cm2
P 1/2 time: 421 msec
S' Lateral: 3.4 cm

## 2021-10-10 NOTE — Telephone Encounter (Signed)
Opened in error

## 2021-10-12 ENCOUNTER — Telehealth: Payer: Self-pay | Admitting: Internal Medicine

## 2021-10-12 DIAGNOSIS — I7121 Aneurysm of the ascending aorta, without rupture: Secondary | ICD-10-CM

## 2021-10-12 NOTE — Addendum Note (Signed)
Addended by: Antonieta Iba on: 10/12/2021 04:00 PM   Modules accepted: Orders

## 2021-10-12 NOTE — Telephone Encounter (Signed)
Called Patient to discuss moderate to severe aortic regurgitation and moderate aortic dilation.    Discussed the clinical course of aortopathy and that depending on the results we would send her to a CT surgeon for evaluation.  Assessment Mod/Severe AI and moderate TAA  Plan  - will need CMR and MRA aorta  Patient had no further questions.  Rudean Haskell, MD [address] [place], [place] [address] [phone]  3:54 PM

## 2021-10-12 NOTE — Telephone Encounter (Signed)
Orders have been placed for CMR and MRA. Patient will be called to schedule.

## 2021-10-18 ENCOUNTER — Other Ambulatory Visit: Payer: Medicare Other

## 2021-10-18 ENCOUNTER — Encounter: Payer: Self-pay | Admitting: Registered Nurse

## 2021-10-18 ENCOUNTER — Other Ambulatory Visit: Payer: Self-pay

## 2021-10-18 ENCOUNTER — Encounter: Payer: No Typology Code available for payment source | Attending: Registered Nurse | Admitting: Registered Nurse

## 2021-10-18 VITALS — BP 151/87 | HR 90 | Ht 61.0 in | Wt 195.0 lb

## 2021-10-18 DIAGNOSIS — Z5181 Encounter for therapeutic drug level monitoring: Secondary | ICD-10-CM | POA: Insufficient documentation

## 2021-10-18 DIAGNOSIS — Z79891 Long term (current) use of opiate analgesic: Secondary | ICD-10-CM | POA: Diagnosis present

## 2021-10-18 DIAGNOSIS — M5416 Radiculopathy, lumbar region: Secondary | ICD-10-CM | POA: Diagnosis present

## 2021-10-18 DIAGNOSIS — G8929 Other chronic pain: Secondary | ICD-10-CM | POA: Diagnosis present

## 2021-10-18 DIAGNOSIS — M5412 Radiculopathy, cervical region: Secondary | ICD-10-CM | POA: Insufficient documentation

## 2021-10-18 DIAGNOSIS — M546 Pain in thoracic spine: Secondary | ICD-10-CM | POA: Insufficient documentation

## 2021-10-18 DIAGNOSIS — R202 Paresthesia of skin: Secondary | ICD-10-CM | POA: Diagnosis present

## 2021-10-18 DIAGNOSIS — M7918 Myalgia, other site: Secondary | ICD-10-CM | POA: Diagnosis present

## 2021-10-18 DIAGNOSIS — M542 Cervicalgia: Secondary | ICD-10-CM | POA: Insufficient documentation

## 2021-10-18 DIAGNOSIS — M961 Postlaminectomy syndrome, not elsewhere classified: Secondary | ICD-10-CM | POA: Insufficient documentation

## 2021-10-18 DIAGNOSIS — M79672 Pain in left foot: Secondary | ICD-10-CM | POA: Diagnosis present

## 2021-10-18 DIAGNOSIS — M4722 Other spondylosis with radiculopathy, cervical region: Secondary | ICD-10-CM | POA: Insufficient documentation

## 2021-10-18 DIAGNOSIS — G894 Chronic pain syndrome: Secondary | ICD-10-CM | POA: Insufficient documentation

## 2021-10-18 MED ORDER — HYDROCODONE-ACETAMINOPHEN 7.5-325 MG PO TABS: ORAL_TABLET | ORAL | 0 refills | Status: DC
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
## 2021-10-19 ENCOUNTER — Other Ambulatory Visit: Payer: Self-pay

## 2021-10-19 ENCOUNTER — Ambulatory Visit (INDEPENDENT_AMBULATORY_CARE_PROVIDER_SITE_OTHER): Payer: Medicare Other | Admitting: Family Medicine

## 2021-10-19 VITALS — BP 153/89 | HR 76 | Ht 61.0 in | Wt 195.2 lb

## 2021-10-19 DIAGNOSIS — I77819 Aortic ectasia, unspecified site: Secondary | ICD-10-CM | POA: Diagnosis not present

## 2021-10-19 DIAGNOSIS — K047 Periapical abscess without sinus: Secondary | ICD-10-CM | POA: Diagnosis not present

## 2021-10-19 DIAGNOSIS — M7989 Other specified soft tissue disorders: Secondary | ICD-10-CM

## 2021-10-19 DIAGNOSIS — Z23 Encounter for immunization: Secondary | ICD-10-CM

## 2021-10-19 MED ORDER — PENICILLIN V POTASSIUM 500 MG PO TABS: 500.0000 mg | ORAL_TABLET | Freq: Four times a day (QID) | ORAL | 0 refills | Status: AC

## 2021-10-19 NOTE — Progress Notes (Signed)
    SUBJECTIVE:   CHIEF COMPLAINT / HPI: leg swelling, dental infection  After recent cardiology visit, she was started on furosemide 20 mg daily prn for leg swelling. Recent TTE 10/24 revealed moderate to severe AI with regurgitation secondary to aortic root aneurysm (48 mm), EF 88-28%, normal diastolic paramteres. Her cardiologist ordered CMR and MRA aorta, possibly needs CT surgery evaluation pending results.  Leg swelling Patient states she has had leg swelling for the past month, primarily in her left lower extremity.  She has had pain as a result of the swelling.  She has been taking furosemide 20 mg as needed but has been taking this every day.  She has compression stockings at home but has not been using them.  She has been elevating her legs.  She has shortness of breath at baseline but denies any worsening of this.  Dental infection Patient reports pain in her left upper molar.  She has been evaluated by dentist and was told she has a dental infection.  She was given the option for root canal or extraction but patient states she was unable to afford either of them.  She is asking for an antibiotic.  She has not noticed any drainage, fever, or chills.  PERTINENT  PMH / PSH: HTN, HFpEF, nonrheumatic aortic valve insufficiency, aortic dilatation, cervical myelopathy, obesity, PTSD  OBJECTIVE:   BP (!) 153/89   Pulse 76   Ht 5\' 1"  (1.549 m)   Wt 195 lb 4 oz (88.6 kg)   LMP 10/22/2014 (Approximate)   SpO2 99%   BMI 36.89 kg/m   General: Alert, seated in chair with cane, NAD HEENT: MMM, missing multiple teeth, cavities seen in the right upper molar without any overt drainage CV: RRR, 2/6 diastolic murmur, 2+ DP and PT pulses bilaterally Pulm: CTAB, no wheezes or rales Extremities: She has significant swelling on the left compared to right which extends from the foot up to the calf without any overt erythema, she has tenderness along the lower extremity worse in the dorsum of  foot   ASSESSMENT/PLAN:   Leg swelling 4 weeks of leg swelling noticeably worse in the left leg.  This is concerning for DVT so DVT ultrasound ordered and scheduled tomorrow morning.  If this is negative, leg swelling is likely related to worsening aortic insufficiency and could consider increasing furosemide dose.  She will need repeat labs and closer follow-up with cardiology if that is the case.   Dental infection Already been evaluated by dentist, root canal or extraction cost prohibitive for patient.  Prescribed 10 days of penicillin.  Dental list given.  Zola Button, MD [place]

## 2021-10-19 NOTE — Patient Instructions (Addendum)
It was nice seeing you today!  Take antibiotics 4 times a day for 10 days.  Go to your ultrasound appointment as scheduled. If it looks normal, we may need to increase your fluid pill.  Use compression stockings for leg swelling. Keep them elevated whenever you can.  Make sure to follow-up with your PCP regarding other issues.  Please arrive at least 15 minutes prior to your scheduled appointments.  Stay well, Zola Button, MD [place] [phone]

## 2021-10-20 ENCOUNTER — Telehealth: Payer: Self-pay | Admitting: Family Medicine

## 2021-10-20 ENCOUNTER — Ambulatory Visit
Admission: RE | Admit: 2021-10-20 | Discharge: 2021-10-20 | Disposition: A | Discharge status: Home / Self Care | Payer: Medicare Other | Source: Ambulatory Visit | Attending: Family Medicine | Admitting: Family Medicine

## 2021-10-20 DIAGNOSIS — M79662 Pain in left lower leg: Secondary | ICD-10-CM | POA: Diagnosis not present

## 2021-10-20 DIAGNOSIS — M7989 Other specified soft tissue disorders: Secondary | ICD-10-CM

## 2021-10-20 MED ORDER — FUROSEMIDE 20 MG PO TABS: 40.0000 mg | ORAL_TABLET | Freq: Every day | ORAL | 0 refills | Status: DC | PRN

## 2021-10-20 NOTE — Telephone Encounter (Signed)
Called patient to discuss ultrasound results.  Negative for DVT.  Recommended increasing furosemide to 40 mg daily as needed for leg swelling.  New prescription sent.  Follow-up appointment with PCP made on 11/15.  She should get BMP and magnesium drawn at this appointment.

## 2021-10-24 ENCOUNTER — Other Ambulatory Visit: Payer: Self-pay | Admitting: Registered Nurse

## 2021-10-26 ENCOUNTER — Other Ambulatory Visit: Payer: Self-pay

## 2021-10-26 MED ORDER — DICLOFENAC SODIUM 1 % EX GEL: CUTANEOUS | 2 refills | Status: DC

## 2021-10-31 ENCOUNTER — Other Ambulatory Visit: Payer: Self-pay | Admitting: Registered Nurse

## 2021-11-01 ENCOUNTER — Ambulatory Visit: Payer: Medicare Other | Admitting: Family Medicine

## 2021-11-14 ENCOUNTER — Telehealth (HOSPITAL_COMMUNITY): Payer: Self-pay | Admitting: Emergency Medicine

## 2021-11-14 NOTE — Telephone Encounter (Signed)
Attempted to call patient regarding upcoming cardiac MR appointment. Left message on voicemail with name and callback number Marchia Bond RN Navigator Cardiac [facility] [phone] Office [phone] Cell  Reminded to get labs

## 2021-11-15 ENCOUNTER — Other Ambulatory Visit: Payer: Medicare Other | Admitting: *Deleted

## 2021-11-15 ENCOUNTER — Other Ambulatory Visit: Payer: Self-pay

## 2021-11-15 ENCOUNTER — Telehealth (HOSPITAL_COMMUNITY): Payer: Medicare Other | Admitting: Emergency Medicine

## 2021-11-15 DIAGNOSIS — I7121 Aneurysm of the ascending aorta, without rupture: Secondary | ICD-10-CM | POA: Diagnosis not present

## 2021-11-15 LAB — CBC
Hematocrit: 34.1 % (ref 34.0–46.6)
Hemoglobin: 11.5 g/dL (ref 11.1–15.9)
MCH: 28.5 pg (ref 26.6–33.0)
MCHC: 33.7 g/dL (ref 31.5–35.7)
MCV: 85 fL (ref 79–97)
Platelets: 273 10*3/uL (ref 150–450)
RBC: 4.03 x10E6/uL (ref 3.77–5.28)
RDW: 12.9 % (ref 11.7–15.4)
WBC: 2.8 10*3/uL — ABNORMAL LOW (ref 3.4–10.8)

## 2021-11-15 NOTE — Telephone Encounter (Signed)
Reaching out to patient to offer assistance regarding upcoming cardiac imaging study; pt verbalizes understanding of appt date/time, parking situation and where to check in,  and verified current allergies; name and call back number provided for further questions should they arise Marchia Bond RN Navigator Cardiac [facility] [phone] office [phone] cell  Some claustro-does not want meds Getting labs today Arrival time 730a

## 2021-11-16 ENCOUNTER — Ambulatory Visit (HOSPITAL_COMMUNITY)
Admission: RE | Admit: 2021-11-16 | Discharge: 2021-11-16 | Disposition: A | Discharge status: Home / Self Care | Payer: Medicare Other | Source: Ambulatory Visit | Attending: Internal Medicine | Admitting: Internal Medicine

## 2021-11-16 ENCOUNTER — Ambulatory Visit (HOSPITAL_COMMUNITY): Payer: Medicare Other

## 2021-11-16 DIAGNOSIS — I7121 Aneurysm of the ascending aorta, without rupture: Secondary | ICD-10-CM | POA: Diagnosis not present

## 2021-11-16 MED ORDER — GADOBUTROL 1 MMOL/ML IV SOLN
10.0000 mL | Freq: Once | INTRAVENOUS | Status: AC | PRN
  Administered 2021-11-16: 10 mL via INTRAVENOUS

## 2021-11-22 ENCOUNTER — Other Ambulatory Visit: Payer: Self-pay | Admitting: Family Medicine

## 2021-11-22 MED ORDER — BUDESONIDE-FORMOTEROL FUMARATE 160-4.5 MCG/ACT IN AERO
2.0000 | INHALATION_SPRAY | Freq: Two times a day (BID) | RESPIRATORY_TRACT | 4 refills | Status: DC
Start: 2021-11-22 — End: 2024-01-22

## 2021-12-02 ENCOUNTER — Telehealth: Payer: Self-pay

## 2021-12-02 NOTE — Telephone Encounter (Signed)
Left a message for pt to call back for results of Cardiac MRI.

## 2021-12-02 NOTE — Telephone Encounter (Signed)
-----   Message from Werner Lean, MD sent at 11/18/2021  3:36 PM EST ----- Caffie Damme, Can we let her know that she has Moderate aortic regurgittaion Moderate to severe aortic sinus dilation  She is going to see Selinda Eon in the new year.  Thanks, MAC   ----- Message ----- From: Burnell Blanks, MD Sent: 11/18/2021  12:39 PM EST To: Imogene Burn, PA-C, #  Mahesh, I agree. Let's let her keep the appt with Selinda Eon next month and if she is having any symptoms, I may move toward coronary imaging and surgery referrral sooner than later. Thanks for seeing her and following up on this. Gerald Stabs  ----- Message ----- From: Werner Lean, MD Sent: 11/18/2021   8:12 AM EST To: Imogene Burn, PA-C, #  Hey all ,  This is a fine lady of Chris's that I saw was DOD sometime ago.  Got a CMR in the setting of progression of AI.  Has moderate AI, but with LV dilation, and moderate-severe aortic dilation (severe by sinus to sinus measurement).  At some point she will need surgery.  The only yellow flag in all of this is her LV dilation.  I think under normal circumstances getting to CT surgery would be reasonable.  Given their presently load, we might consider repeat imaging with concomitant CCTA  in 6 months and, if needed, surgery.  Wanted to get your thoughts- she has an appointment with Selinda Eon 12/2021.  Thanks, MAC    ----- Message ----- From: Interface, Rad Results In Sent: 11/17/2021   7:12 PM EST To: Werner Lean, MD

## 2021-12-05 ENCOUNTER — Telehealth: Payer: Self-pay | Admitting: Registered Nurse

## 2021-12-05 NOTE — Telephone Encounter (Signed)
Needs a new heating wraps for neck and back and the unit itself is no longer working.

## 2021-12-13 ENCOUNTER — Encounter: Payer: No Typology Code available for payment source | Attending: Registered Nurse | Admitting: Registered Nurse

## 2021-12-13 ENCOUNTER — Encounter: Payer: Self-pay | Admitting: Registered Nurse

## 2021-12-13 ENCOUNTER — Other Ambulatory Visit: Payer: Self-pay

## 2021-12-13 VITALS — BP 111/68 | HR 76 | Temp 98.5°F | Ht 61.0 in | Wt 180.0 lb

## 2021-12-13 DIAGNOSIS — Z5181 Encounter for therapeutic drug level monitoring: Secondary | ICD-10-CM | POA: Insufficient documentation

## 2021-12-13 DIAGNOSIS — Z79891 Long term (current) use of opiate analgesic: Secondary | ICD-10-CM | POA: Diagnosis present

## 2021-12-13 DIAGNOSIS — M4722 Other spondylosis with radiculopathy, cervical region: Secondary | ICD-10-CM | POA: Diagnosis not present

## 2021-12-13 DIAGNOSIS — R202 Paresthesia of skin: Secondary | ICD-10-CM | POA: Insufficient documentation

## 2021-12-13 DIAGNOSIS — M7918 Myalgia, other site: Secondary | ICD-10-CM | POA: Diagnosis present

## 2021-12-13 DIAGNOSIS — G8929 Other chronic pain: Secondary | ICD-10-CM | POA: Diagnosis present

## 2021-12-13 DIAGNOSIS — M5416 Radiculopathy, lumbar region: Secondary | ICD-10-CM | POA: Diagnosis present

## 2021-12-13 DIAGNOSIS — M546 Pain in thoracic spine: Secondary | ICD-10-CM | POA: Insufficient documentation

## 2021-12-13 DIAGNOSIS — M542 Cervicalgia: Secondary | ICD-10-CM | POA: Diagnosis present

## 2021-12-13 DIAGNOSIS — G894 Chronic pain syndrome: Secondary | ICD-10-CM | POA: Insufficient documentation

## 2021-12-13 DIAGNOSIS — M5412 Radiculopathy, cervical region: Secondary | ICD-10-CM | POA: Diagnosis not present

## 2021-12-13 DIAGNOSIS — M961 Postlaminectomy syndrome, not elsewhere classified: Secondary | ICD-10-CM | POA: Insufficient documentation

## 2021-12-13 MED ORDER — HYDROCODONE-ACETAMINOPHEN 7.5-325 MG PO TABS: ORAL_TABLET | ORAL | 0 refills | Status: DC
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
## 2021-12-27 ENCOUNTER — Other Ambulatory Visit: Payer: Self-pay | Admitting: Physician Assistant

## 2021-12-27 ENCOUNTER — Other Ambulatory Visit: Payer: Self-pay | Admitting: Family Medicine

## 2021-12-27 ENCOUNTER — Encounter: Payer: Self-pay | Admitting: Physician Assistant

## 2021-12-27 ENCOUNTER — Other Ambulatory Visit: Payer: Self-pay

## 2021-12-27 ENCOUNTER — Ambulatory Visit (INDEPENDENT_AMBULATORY_CARE_PROVIDER_SITE_OTHER): Payer: Medicare Other | Admitting: Physician Assistant

## 2021-12-27 VITALS — BP 154/90 | HR 78 | Ht 61.5 in | Wt 183.8 lb

## 2021-12-27 DIAGNOSIS — I110 Hypertensive heart disease with heart failure: Secondary | ICD-10-CM

## 2021-12-27 DIAGNOSIS — J302 Other seasonal allergic rhinitis: Secondary | ICD-10-CM

## 2021-12-27 DIAGNOSIS — R0602 Shortness of breath: Secondary | ICD-10-CM

## 2021-12-27 DIAGNOSIS — I351 Nonrheumatic aortic (valve) insufficiency: Secondary | ICD-10-CM | POA: Diagnosis not present

## 2021-12-27 DIAGNOSIS — I5032 Chronic diastolic (congestive) heart failure: Secondary | ICD-10-CM | POA: Diagnosis not present

## 2021-12-27 LAB — BASIC METABOLIC PANEL
BUN/Creatinine Ratio: 18 (ref 9–23)
BUN: 16 mg/dL (ref 6–24)
CO2: 27 mmol/L (ref 20–29)
Calcium: 9.1 mg/dL (ref 8.7–10.2)
Chloride: 102 mmol/L (ref 96–106)
Creatinine, Ser: 0.88 mg/dL (ref 0.57–1.00)
Glucose: 82 mg/dL (ref 70–99)
Potassium: 4.3 mmol/L (ref 3.5–5.2)
Sodium: 140 mmol/L (ref 134–144)
eGFR: 76 mL/min/{1.73_m2} (ref >59)

## 2021-12-27 LAB — CBC
Hematocrit: 35.8 % (ref 34.0–46.6)
Hemoglobin: 12.1 g/dL (ref 11.1–15.9)
MCH: 28.9 pg (ref 26.6–33.0)
MCHC: 33.8 g/dL (ref 31.5–35.7)
MCV: 85 fL (ref 79–97)
Platelets: 237 10*3/uL (ref 150–450)
RBC: 4.19 x10E6/uL (ref 3.77–5.28)
RDW: 13 % (ref 11.7–15.4)
WBC: 2.6 10*3/uL — ABNORMAL LOW (ref 3.4–10.8)

## 2021-12-27 MED ORDER — HYDRALAZINE HCL 50 MG PO TABS: 75.0000 mg | ORAL_TABLET | Freq: Three times a day (TID) | ORAL | 3 refills | Status: DC

## 2021-12-27 MED ORDER — FUROSEMIDE 20 MG PO TABS: 40.0000 mg | ORAL_TABLET | Freq: Every day | ORAL | 11 refills | Status: DC | PRN

## 2021-12-27 MED ORDER — PREDNISONE 50 MG PO TABS: ORAL_TABLET | ORAL | 0 refills | Status: DC

## 2021-12-27 MED ORDER — METOPROLOL TARTRATE 100 MG PO TABS: ORAL_TABLET | ORAL | 0 refills | Status: DC

## 2021-12-27 NOTE — Addendum Note (Signed)
Addended by: Carylon Perches on: 12/27/2021 01:27 PM   Modules accepted: Orders

## 2021-12-27 NOTE — Patient Instructions (Signed)
Medication Instructions:  Your physician has recommended you make the following change in your medication:   INCREASE: Hydralazine to 75mg  three times daily  *If you need a refill on your cardiac medications before your next appointment, please call your pharmacy*   Lab Work: TODAY: BMET, CBC  If you have labs (blood work) drawn today and your tests are completely normal, you will receive your results only by: [employer] (if you have MyChart) OR A paper copy in the mail If you have any lab test that is abnormal or we need to change your treatment, we will call you to review the results.   Follow-Up: At [facility], you and your health needs are our priority.  As part of our continuing mission to provide you with exceptional heart care, we have created designated Provider Care Teams.  These Care Teams include your primary Cardiologist (physician) and Advanced Practice Providers (APPs -  Physician Assistants and Nurse Practitioners) who all work together to provide you with the care you need, when you need it.   Your next appointment:   First Available  The format for your next appointment:   In Person  Provider:   Lauree Chandler, MD         Your cardiac CT will be scheduled at one of the below locations:   [facility] [address] [place], [place] [address] [phone]  If scheduled at [facility], please arrive at the [facility] main entrance (entrance A) of [facility] 30 minutes prior to test start time. You can use the FREE valet parking offered at the main entrance (encouraged to control the heart rate for the test) Proceed to the [facility] Radiology Department (first floor) to check-in and test prep.  Please follow these instructions carefully (unless otherwise directed):  Hold all erectile dysfunction medications at least 3 days (72 hrs) prior to test.  On the Night Before the Test: Be sure to Drink plenty of  water. Do not consume any caffeinated/decaffeinated beverages or chocolate 12 hours prior to your test. Do not take any antihistamines 12 hours prior to your test. If the patient has contrast allergy: Patient will need a prescription for Prednisone and very clear instructions (as follows): Prednisone 50 mg - take 13 hours prior to test Take another Prednisone 50 mg 7 hours prior to test Take another Prednisone 50 mg 1 hour prior to test Take Benadryl 50 mg 1 hour prior to test Patient must complete all four doses of above prophylactic medications. Patient will need a ride after test due to Benadryl.  On the Day of the Test: Drink plenty of water until 1 hour prior to the test. Do not eat any food 4 hours prior to the test. You may take your regular medications prior to the test.  Take metoprolol (Lopressor) two hours prior to test. HOLD Furosemide/Hydrochlorothiazide morning of the test. FEMALES- please wear underwire-free bra if available, avoid dresses & tight clothing       After the Test: Drink plenty of water. After receiving IV contrast, you may experience a mild flushed feeling. This is normal. On occasion, you may experience a mild rash up to 24 hours after the test. This is not dangerous. If this occurs, you can take Benadryl 25 mg and increase your fluid intake. If you experience trouble breathing, this can be serious. If it is severe call 911 IMMEDIATELY. If it is mild, please call our office. If you take any  of these medications: Glipizide/Metformin, Avandament, Glucavance, please do not take 48 hours after completing test unless otherwise instructed.  Please allow 2-4 weeks for scheduling of routine cardiac CTs. Some insurance companies require a pre-authorization which may delay scheduling of this test.   For non-scheduling related questions, please contact the cardiac imaging nurse navigator should you have any questions/concerns: Marchia Bond, Cardiac Imaging Nurse  Navigator Gordy Clement, Cardiac Imaging Nurse Navigator [facility] Direct Office Dial: [phone]   For scheduling needs, including cancellations and rescheduling, please call [place], [phone].

## 2022-01-05 ENCOUNTER — Telehealth: Payer: Self-pay | Admitting: *Deleted

## 2022-01-05 NOTE — Telephone Encounter (Signed)
Maria Griffith called asking about her heat wrap and tens unit. Do you know what she is talking about?

## 2022-01-06 ENCOUNTER — Telehealth (HOSPITAL_COMMUNITY): Payer: Self-pay | Admitting: *Deleted

## 2022-01-06 NOTE — Telephone Encounter (Signed)
Attempted to call patient regarding upcoming cardiac CT appointment. °Left message on voicemail with name and callback number ° °Jamian Andujo RN Navigator Cardiac Imaging °[facility] °[phone] Office °[phone] Cell ° °

## 2022-01-06 NOTE — Telephone Encounter (Signed)
Notified. 

## 2022-01-06 NOTE — Telephone Encounter (Signed)
Patient returning regarding upcoming cardiac imaging study; pt verbalizes understanding of appt date/time, parking situation and where to check in, pre-test NPO status and medications ordered, and verified current allergies; name and call back number provided for further questions should they arise  Gordy Clement RN Navigator Cardiac Imaging [place] [facility] [phone] office [phone] cell  Reviewed when to take 13 hour prep and metoprolol with patient. She states she wrote th timed down. She is aware she will need a driver due to benadryl and to arrive at 9:30am for her 10am scan.

## 2022-01-09 ENCOUNTER — Other Ambulatory Visit: Payer: Self-pay

## 2022-01-09 ENCOUNTER — Ambulatory Visit (HOSPITAL_COMMUNITY)
Admission: RE | Admit: 2022-01-09 | Discharge: 2022-01-09 | Disposition: A | Discharge status: Home / Self Care | Payer: Medicare Other | Source: Ambulatory Visit | Attending: Physician Assistant | Admitting: Physician Assistant

## 2022-01-09 ENCOUNTER — Ambulatory Visit (HOSPITAL_COMMUNITY): Payer: Medicare Other

## 2022-01-09 DIAGNOSIS — R0602 Shortness of breath: Secondary | ICD-10-CM | POA: Diagnosis not present

## 2022-01-09 DIAGNOSIS — I351 Nonrheumatic aortic (valve) insufficiency: Secondary | ICD-10-CM | POA: Diagnosis not present

## 2022-01-09 MED ORDER — NITROGLYCERIN 0.4 MG SL SUBL
SUBLINGUAL_TABLET | SUBLINGUAL | Status: AC
  Filled 2022-01-09: qty 2

## 2022-01-09 MED ORDER — IOHEXOL 350 MG/ML SOLN
100.0000 mL | Freq: Once | INTRAVENOUS | Status: AC | PRN
  Administered 2022-01-09: 100 mL via INTRAVENOUS

## 2022-01-09 MED ORDER — NITROGLYCERIN 0.4 MG SL SUBL
0.8000 mg | SUBLINGUAL_TABLET | Freq: Once | SUBLINGUAL | Status: AC
Start: 2022-01-09 — End: 2022-01-09
  Administered 2022-01-09: 0.8 mg via SUBLINGUAL

## 2022-01-11 ENCOUNTER — Other Ambulatory Visit: Payer: Self-pay | Admitting: *Deleted

## 2022-01-11 DIAGNOSIS — I7121 Aneurysm of the ascending aorta, without rupture: Secondary | ICD-10-CM

## 2022-01-11 DIAGNOSIS — R931 Abnormal findings on diagnostic imaging of heart and coronary circulation: Secondary | ICD-10-CM

## 2022-01-11 DIAGNOSIS — I351 Nonrheumatic aortic (valve) insufficiency: Secondary | ICD-10-CM

## 2022-01-11 DIAGNOSIS — R0602 Shortness of breath: Secondary | ICD-10-CM

## 2022-01-11 NOTE — Progress Notes (Signed)
Maria Blanks, MD  Maria Griffith, Maria Griffith, Maria Griffith; Rodman Key, RN; Imogene Burn, PA-C [place], I think we need to go ahead and have her see CT surgery so they can review all of her findings from the echo, cardiac MRI and coronary CTA and decide if surgery is indicated. Can you make a referral to them and see if she will go over there to get their opinion? I reviewed this with Dr. Gasper Sells today and he agrees. Thanks, chris         Order placed for ct surgery.

## 2022-01-16 ENCOUNTER — Other Ambulatory Visit: Payer: Self-pay | Admitting: Registered Nurse

## 2022-01-17 ENCOUNTER — Ambulatory Visit: Payer: Medicare Other | Admitting: Registered Nurse

## 2022-01-17 ENCOUNTER — Encounter: Payer: No Typology Code available for payment source | Attending: Registered Nurse | Admitting: Registered Nurse

## 2022-01-17 ENCOUNTER — Encounter: Payer: Self-pay | Admitting: Registered Nurse

## 2022-01-17 ENCOUNTER — Other Ambulatory Visit: Payer: Self-pay

## 2022-01-17 VITALS — BP 176/98 | HR 80 | Temp 98.0°F | Ht 61.5 in | Wt 184.0 lb

## 2022-01-17 DIAGNOSIS — I10 Essential (primary) hypertension: Secondary | ICD-10-CM | POA: Insufficient documentation

## 2022-01-17 DIAGNOSIS — M546 Pain in thoracic spine: Secondary | ICD-10-CM | POA: Insufficient documentation

## 2022-01-17 DIAGNOSIS — M5412 Radiculopathy, cervical region: Secondary | ICD-10-CM | POA: Insufficient documentation

## 2022-01-17 DIAGNOSIS — M542 Cervicalgia: Secondary | ICD-10-CM | POA: Insufficient documentation

## 2022-01-17 DIAGNOSIS — M5416 Radiculopathy, lumbar region: Secondary | ICD-10-CM | POA: Diagnosis present

## 2022-01-17 DIAGNOSIS — G894 Chronic pain syndrome: Secondary | ICD-10-CM | POA: Insufficient documentation

## 2022-01-17 DIAGNOSIS — Z79891 Long term (current) use of opiate analgesic: Secondary | ICD-10-CM | POA: Insufficient documentation

## 2022-01-17 DIAGNOSIS — M7918 Myalgia, other site: Secondary | ICD-10-CM | POA: Diagnosis present

## 2022-01-17 DIAGNOSIS — M961 Postlaminectomy syndrome, not elsewhere classified: Secondary | ICD-10-CM | POA: Insufficient documentation

## 2022-01-17 DIAGNOSIS — G8929 Other chronic pain: Secondary | ICD-10-CM | POA: Diagnosis present

## 2022-01-17 DIAGNOSIS — M4722 Other spondylosis with radiculopathy, cervical region: Secondary | ICD-10-CM | POA: Diagnosis present

## 2022-01-17 DIAGNOSIS — Z5181 Encounter for therapeutic drug level monitoring: Secondary | ICD-10-CM | POA: Diagnosis present

## 2022-01-17 DIAGNOSIS — R202 Paresthesia of skin: Secondary | ICD-10-CM | POA: Insufficient documentation
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
## 2022-01-17 NOTE — Telephone Encounter (Signed)
Pharmacy stated that it was just filled on 01/16/22, but any authorized refills will be put on file

## 2022-01-26 ENCOUNTER — Other Ambulatory Visit: Payer: Self-pay | Admitting: Thoracic Surgery (Cardiothoracic Vascular Surgery)

## 2022-01-26 ENCOUNTER — Encounter: Payer: Self-pay | Admitting: Thoracic Surgery (Cardiothoracic Vascular Surgery)

## 2022-01-26 ENCOUNTER — Other Ambulatory Visit: Payer: Self-pay

## 2022-01-26 ENCOUNTER — Institutional Professional Consult (permissible substitution) (INDEPENDENT_AMBULATORY_CARE_PROVIDER_SITE_OTHER): Payer: Medicare Other | Admitting: Thoracic Surgery (Cardiothoracic Vascular Surgery)

## 2022-01-26 VITALS — BP 163/96 | HR 91 | Resp 20 | Ht 61.5 in | Wt 183.6 lb

## 2022-01-26 DIAGNOSIS — I77819 Aortic ectasia, unspecified site: Secondary | ICD-10-CM
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
## 2022-01-31 ENCOUNTER — Other Ambulatory Visit: Payer: Self-pay | Admitting: Thoracic Surgery (Cardiothoracic Vascular Surgery)

## 2022-02-01 LAB — SARS CORONAVIRUS 2 (TAT 6-24 HRS): SARS Coronavirus 2: NEGATIVE

## 2022-02-03 ENCOUNTER — Ambulatory Visit (HOSPITAL_COMMUNITY)
Admission: RE | Admit: 2022-02-03 | Discharge: 2022-02-03 | Disposition: A | Discharge status: Home / Self Care | Payer: Medicare Other | Source: Ambulatory Visit | Attending: Thoracic Surgery (Cardiothoracic Vascular Surgery) | Admitting: Thoracic Surgery (Cardiothoracic Vascular Surgery)

## 2022-02-03 ENCOUNTER — Other Ambulatory Visit: Payer: Self-pay

## 2022-02-03 DIAGNOSIS — I77819 Aortic ectasia, unspecified site: Secondary | ICD-10-CM | POA: Diagnosis not present

## 2022-02-03 DIAGNOSIS — R0609 Other forms of dyspnea: Secondary | ICD-10-CM | POA: Diagnosis not present

## 2022-02-03 DIAGNOSIS — Z79899 Other long term (current) drug therapy: Secondary | ICD-10-CM | POA: Diagnosis not present

## 2022-02-03 DIAGNOSIS — Z87891 Personal history of nicotine dependence: Secondary | ICD-10-CM | POA: Diagnosis not present

## 2022-02-03 DIAGNOSIS — Z7951 Long term (current) use of inhaled steroids: Secondary | ICD-10-CM | POA: Diagnosis not present

## 2022-02-03 LAB — PULMONARY FUNCTION TEST
DL/VA % pred: 120 %
DL/VA: 5.2 ml/min/mmHg/L
DLCO unc % pred: 76 %
DLCO unc: 14.26 ml/min/mmHg
FEF 25-75 Post: 1.87 L/sec
FEF 25-75 Pre: 1.74 L/sec
FEF2575-%Change-Post: 7 %
FEF2575-%Pred-Post: 96 %
FEF2575-%Pred-Pre: 89 %
FEV1-%Change-Post: 1 %
FEV1-%Pred-Post: 74 %
FEV1-%Pred-Pre: 73 %
FEV1-Post: 1.42 L
FEV1-Pre: 1.4 L
FEV1FVC-%Change-Post: 2 %
FEV1FVC-%Pred-Pre: 106 %
FEV6-%Change-Post: -1 %
FEV6-%Pred-Post: 69 %
FEV6-%Pred-Pre: 70 %
FEV6-Post: 1.63 L
FEV6-Pre: 1.65 L
FEV6FVC-%Pred-Post: 103 %
FEV6FVC-%Pred-Pre: 103 %
FVC-%Change-Post: -1 %
FVC-%Pred-Post: 67 %
FVC-%Pred-Pre: 68 %
FVC-Post: 1.63 L
FVC-Pre: 1.65 L
Post FEV1/FVC ratio: 87 %
Post FEV6/FVC ratio: 100 %
Pre FEV1/FVC ratio: 85 %
Pre FEV6/FVC Ratio: 100 %
RV % pred: 89 %
RV: 1.65 L
TLC % pred: 71 %
TLC: 3.33 L

## 2022-02-03 MED ORDER — ALBUTEROL SULFATE (2.5 MG/3ML) 0.083% IN NEBU
2.5000 mg | INHALATION_SOLUTION | Freq: Once | RESPIRATORY_TRACT | Status: AC
Start: 2022-02-03 — End: 2022-02-03
  Administered 2022-02-03: 2.5 mg via RESPIRATORY_TRACT

## 2022-02-14 ENCOUNTER — Encounter: Payer: No Typology Code available for payment source | Admitting: Registered Nurse

## 2022-02-14 ENCOUNTER — Telehealth: Payer: Self-pay | Admitting: Registered Nurse

## 2022-02-14 ENCOUNTER — Other Ambulatory Visit: Payer: Self-pay | Admitting: Registered Nurse

## 2022-02-14 MED ORDER — HYDROCODONE-ACETAMINOPHEN 7.5-325 MG PO TABS: ORAL_TABLET | ORAL | 0 refills | Status: DC

## 2022-02-14 NOTE — Telephone Encounter (Signed)
Patient canceled appt today with Maria Griffith.  I have rescheduled her for next Thursday at 11:40 and told patient to be here at 11:20.  She said she will be out of medication after today.

## 2022-02-14 NOTE — Telephone Encounter (Signed)
PMP was Reviewed.  Hydrocodone e-scribed today.  Maria Griffith is aware of the above.

## 2022-02-15 ENCOUNTER — Ambulatory Visit: Payer: Medicare Other | Admitting: Thoracic Surgery (Cardiothoracic Vascular Surgery)

## 2022-02-15 ENCOUNTER — Other Ambulatory Visit: Payer: Self-pay

## 2022-02-15 VITALS — BP 176/111 | HR 95 | Resp 20 | Ht 61.0 in | Wt 185.0 lb

## 2022-02-15 DIAGNOSIS — I351 Nonrheumatic aortic (valve) insufficiency: Secondary | ICD-10-CM | POA: Diagnosis not present

## 2022-02-15 DIAGNOSIS — I77819 Aortic ectasia, unspecified site: Secondary | ICD-10-CM | POA: Diagnosis not present
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
## 2022-02-16 ENCOUNTER — Other Ambulatory Visit: Payer: Self-pay | Admitting: Registered Nurse

## 2022-02-16 ENCOUNTER — Other Ambulatory Visit: Payer: Self-pay | Admitting: Family Medicine

## 2022-02-16 MED ORDER — OXYBUTYNIN CHLORIDE 5 MG PO TABS: 5.0000 mg | ORAL_TABLET | Freq: Two times a day (BID) | ORAL | 1 refills | Status: DC

## 2022-02-16 NOTE — Telephone Encounter (Signed)
Refill completed. ?She was evaluated last by a resident in Nov, 2022. ?She need to see me soon for health maintenance. ?

## 2022-02-22 ENCOUNTER — Ambulatory Visit: Payer: No Typology Code available for payment source | Admitting: Registered Nurse

## 2022-02-23 ENCOUNTER — Encounter: Payer: Self-pay | Admitting: Registered Nurse

## 2022-02-23 ENCOUNTER — Encounter: Payer: No Typology Code available for payment source | Attending: Registered Nurse | Admitting: Registered Nurse

## 2022-02-23 ENCOUNTER — Other Ambulatory Visit: Payer: Self-pay

## 2022-02-23 VITALS — BP 179/94 | HR 70 | Ht 61.0 in | Wt 187.6 lb

## 2022-02-23 DIAGNOSIS — I10 Essential (primary) hypertension: Secondary | ICD-10-CM

## 2022-02-23 DIAGNOSIS — M961 Postlaminectomy syndrome, not elsewhere classified: Secondary | ICD-10-CM | POA: Diagnosis not present

## 2022-02-23 DIAGNOSIS — M4722 Other spondylosis with radiculopathy, cervical region: Secondary | ICD-10-CM | POA: Diagnosis present

## 2022-02-23 DIAGNOSIS — Z5181 Encounter for therapeutic drug level monitoring: Secondary | ICD-10-CM

## 2022-02-23 DIAGNOSIS — Z79891 Long term (current) use of opiate analgesic: Secondary | ICD-10-CM | POA: Diagnosis not present

## 2022-02-23 DIAGNOSIS — M7918 Myalgia, other site: Secondary | ICD-10-CM

## 2022-02-23 DIAGNOSIS — M542 Cervicalgia: Secondary | ICD-10-CM | POA: Diagnosis present

## 2022-02-23 DIAGNOSIS — M5412 Radiculopathy, cervical region: Secondary | ICD-10-CM

## 2022-02-23 DIAGNOSIS — R202 Paresthesia of skin: Secondary | ICD-10-CM

## 2022-02-23 DIAGNOSIS — G894 Chronic pain syndrome: Secondary | ICD-10-CM | POA: Diagnosis not present

## 2022-02-23 DIAGNOSIS — G8929 Other chronic pain: Secondary | ICD-10-CM | POA: Diagnosis present

## 2022-02-23 DIAGNOSIS — M546 Pain in thoracic spine: Secondary | ICD-10-CM | POA: Insufficient documentation

## 2022-02-23 DIAGNOSIS — M5416 Radiculopathy, lumbar region: Secondary | ICD-10-CM

## 2022-02-23 MED ORDER — HYDROCODONE-ACETAMINOPHEN 7.5-325 MG PO TABS: ORAL_TABLET | ORAL | 0 refills | Status: DC
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
## 2022-03-01 LAB — TOXASSURE SELECT,+ANTIDEPR,UR

## 2022-03-03 ENCOUNTER — Telehealth: Payer: Self-pay | Admitting: *Deleted

## 2022-03-03 NOTE — Telephone Encounter (Signed)
Urine drug screen for this encounter is consistent for prescribed medication 

## 2022-03-14 ENCOUNTER — Telehealth: Payer: Self-pay | Admitting: *Deleted

## 2022-03-14 ENCOUNTER — Other Ambulatory Visit: Payer: Self-pay | Admitting: Family Medicine

## 2022-03-14 NOTE — Telephone Encounter (Signed)
Maria Griffith is requesting a letter from [place] that she has to have for her tens unit supplies and her heat wraps for her back and neck. ?

## 2022-04-05 ENCOUNTER — Encounter: Payer: No Typology Code available for payment source | Attending: Registered Nurse | Admitting: Registered Nurse

## 2022-04-05 VITALS — BP 130/81 | HR 80 | Ht 61.0 in | Wt 189.0 lb

## 2022-04-05 DIAGNOSIS — M961 Postlaminectomy syndrome, not elsewhere classified: Secondary | ICD-10-CM

## 2022-04-05 DIAGNOSIS — R202 Paresthesia of skin: Secondary | ICD-10-CM

## 2022-04-05 DIAGNOSIS — M5416 Radiculopathy, lumbar region: Secondary | ICD-10-CM | POA: Diagnosis present

## 2022-04-05 DIAGNOSIS — M542 Cervicalgia: Secondary | ICD-10-CM | POA: Diagnosis present

## 2022-04-05 DIAGNOSIS — M4722 Other spondylosis with radiculopathy, cervical region: Secondary | ICD-10-CM | POA: Diagnosis present

## 2022-04-05 DIAGNOSIS — G894 Chronic pain syndrome: Secondary | ICD-10-CM

## 2022-04-05 DIAGNOSIS — M7918 Myalgia, other site: Secondary | ICD-10-CM

## 2022-04-05 DIAGNOSIS — M546 Pain in thoracic spine: Secondary | ICD-10-CM | POA: Diagnosis present

## 2022-04-05 DIAGNOSIS — Z5181 Encounter for therapeutic drug level monitoring: Secondary | ICD-10-CM | POA: Diagnosis present

## 2022-04-05 DIAGNOSIS — G8929 Other chronic pain: Secondary | ICD-10-CM

## 2022-04-05 DIAGNOSIS — Z79891 Long term (current) use of opiate analgesic: Secondary | ICD-10-CM

## 2022-04-05 DIAGNOSIS — M5412 Radiculopathy, cervical region: Secondary | ICD-10-CM

## 2022-04-05 MED ORDER — HYDROCODONE-ACETAMINOPHEN 7.5-325 MG PO TABS: ORAL_TABLET | ORAL | 0 refills | Status: DC
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
## 2022-04-12 ENCOUNTER — Encounter: Payer: Self-pay | Admitting: Registered Nurse

## 2022-04-14 ENCOUNTER — Other Ambulatory Visit: Payer: Self-pay | Admitting: *Deleted

## 2022-04-14 ENCOUNTER — Ambulatory Visit: Payer: Medicare Other | Admitting: Family Medicine

## 2022-04-14 MED ORDER — LIDOCAINE 5 % EX PTCH: MEDICATED_PATCH | CUTANEOUS | 0 refills | Status: DC

## 2022-04-20 ENCOUNTER — Other Ambulatory Visit: Payer: Self-pay

## 2022-04-20 MED ORDER — LOSARTAN POTASSIUM 100 MG PO TABS: 100.0000 mg | ORAL_TABLET | Freq: Every day | ORAL | 0 refills | Status: DC
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
## 2022-05-02 ENCOUNTER — Ambulatory Visit (INDEPENDENT_AMBULATORY_CARE_PROVIDER_SITE_OTHER): Payer: Medicare Other | Admitting: Cardiovascular Disease

## 2022-05-02 ENCOUNTER — Encounter: Payer: Self-pay | Admitting: Cardiovascular Disease

## 2022-05-02 ENCOUNTER — Ambulatory Visit: Payer: Medicare Other | Admitting: Family Medicine

## 2022-05-02 VITALS — BP 132/74 | HR 98 | Ht 61.0 in | Wt 189.4 lb

## 2022-05-02 DIAGNOSIS — I351 Nonrheumatic aortic (valve) insufficiency: Secondary | ICD-10-CM

## 2022-05-02 DIAGNOSIS — I110 Hypertensive heart disease with heart failure: Secondary | ICD-10-CM

## 2022-05-02 DIAGNOSIS — I2510 Atherosclerotic heart disease of native coronary artery without angina pectoris: Secondary | ICD-10-CM | POA: Diagnosis not present

## 2022-05-02 DIAGNOSIS — I5032 Chronic diastolic (congestive) heart failure: Secondary | ICD-10-CM | POA: Diagnosis not present

## 2022-05-02 NOTE — Patient Instructions (Signed)
Medication Instructions:  No changes *If you need a refill on your cardiac medications before your next appointment, please call your pharmacy*   Lab Work: none If you have labs (blood work) drawn today and your tests are completely normal, you will receive your results only by: MyChart Message (if you have MyChart) OR A paper copy in the mail If you have any lab test that is abnormal or we need to change your treatment, we will call you to review the results.   Testing/Procedures: none   Follow-Up: At [facility], you and your health needs are our priority.  As part of our continuing mission to provide you with exceptional heart care, we have created designated Provider Care Teams.  These Care Teams include your primary Cardiologist (physician) and Advanced Practice Providers (APPs -  Physician Assistants and Nurse Practitioners) who all work together to provide you with the care you need, when you need it.   Your next appointment:   6 month(s)  The format for your next appointment:   In Person  Provider:   Christopher McAlhany, MD     Other Instructions   Important Information About Sugar       

## 2022-05-03 ENCOUNTER — Other Ambulatory Visit: Payer: Self-pay | Admitting: Registered Nurse

## 2022-05-08 ENCOUNTER — Encounter: Payer: Self-pay | Admitting: Family Medicine

## 2022-05-08 DIAGNOSIS — J984 Other disorders of lung: Secondary | ICD-10-CM | POA: Insufficient documentation

## 2022-05-09 ENCOUNTER — Ambulatory Visit (INDEPENDENT_AMBULATORY_CARE_PROVIDER_SITE_OTHER): Payer: Medicare Other | Admitting: Family Medicine

## 2022-05-09 ENCOUNTER — Encounter: Payer: Self-pay | Admitting: Family Medicine

## 2022-05-09 ENCOUNTER — Ambulatory Visit (INDEPENDENT_AMBULATORY_CARE_PROVIDER_SITE_OTHER): Payer: Medicare Other

## 2022-05-09 ENCOUNTER — Other Ambulatory Visit (HOSPITAL_COMMUNITY)
Admission: RE | Admit: 2022-05-09 | Discharge: 2022-05-09 | Disposition: A | Discharge status: Home / Self Care | Payer: Medicare Other | Source: Ambulatory Visit | Attending: Family Medicine | Admitting: Family Medicine

## 2022-05-09 VITALS — BP 173/90 | HR 88 | Ht 61.0 in | Wt 188.8 lb

## 2022-05-09 DIAGNOSIS — Z0000 Encounter for general adult medical examination without abnormal findings: Secondary | ICD-10-CM

## 2022-05-09 DIAGNOSIS — I5030 Unspecified diastolic (congestive) heart failure: Secondary | ICD-10-CM

## 2022-05-09 DIAGNOSIS — Z124 Encounter for screening for malignant neoplasm of cervix: Secondary | ICD-10-CM

## 2022-05-09 DIAGNOSIS — R252 Cramp and spasm: Secondary | ICD-10-CM | POA: Diagnosis not present

## 2022-05-09 DIAGNOSIS — I77819 Aortic ectasia, unspecified site: Secondary | ICD-10-CM | POA: Diagnosis not present

## 2022-05-09 DIAGNOSIS — Z01419 Encounter for gynecological examination (general) (routine) without abnormal findings: Secondary | ICD-10-CM | POA: Diagnosis present

## 2022-05-09 DIAGNOSIS — Z113 Encounter for screening for infections with a predominantly sexual mode of transmission: Secondary | ICD-10-CM

## 2022-05-09 DIAGNOSIS — Z114 Encounter for screening for human immunodeficiency virus [HIV]: Secondary | ICD-10-CM | POA: Diagnosis not present

## 2022-05-09 DIAGNOSIS — G959 Disease of spinal cord, unspecified: Secondary | ICD-10-CM | POA: Diagnosis not present

## 2022-05-09 DIAGNOSIS — D709 Neutropenia, unspecified: Secondary | ICD-10-CM | POA: Diagnosis not present

## 2022-05-09 DIAGNOSIS — Z23 Encounter for immunization: Secondary | ICD-10-CM | POA: Diagnosis not present

## 2022-05-09 DIAGNOSIS — Z1151 Encounter for screening for human papillomavirus (HPV): Secondary | ICD-10-CM | POA: Diagnosis not present

## 2022-05-09 DIAGNOSIS — E559 Vitamin D deficiency, unspecified: Secondary | ICD-10-CM | POA: Diagnosis not present

## 2022-05-09 DIAGNOSIS — I10 Essential (primary) hypertension: Secondary | ICD-10-CM

## 2022-05-09 MED ORDER — HYDRALAZINE HCL 50 MG PO TABS: ORAL_TABLET | ORAL | 3 refills | Status: DC

## 2022-05-09 MED ORDER — LORATADINE 10 MG PO TABS: ORAL_TABLET | ORAL | 1 refills | Status: DC

## 2022-05-09 MED ORDER — OXYBUTYNIN CHLORIDE 5 MG PO TABS: 5.0000 mg | ORAL_TABLET | Freq: Two times a day (BID) | ORAL | 1 refills | Status: DC

## 2022-05-09 MED ORDER — LOSARTAN POTASSIUM 100 MG PO TABS: 100.0000 mg | ORAL_TABLET | Freq: Every day | ORAL | 1 refills | Status: DC

## 2022-05-09 NOTE — Patient Instructions (Signed)
Pelvic Exam A pelvic exam is an exam of a woman's outer and inner genitals and reproductive organs. Pelvic exams are done to screen for health problems and to help prevent health problems from developing. A pelvic exam may be recommended to help explain or diagnose: Changes in your body that may be signs of cancer in the reproductive system. Inability to get pregnant (infertility). Cause of vaginal itching or burning. Abnormal vaginal discharge or bleeding. Problems with sexual function. Problems with urination. Problems with menstrual periods. Problems with the position of your pelvic organs due to weakening muscles (prolapse). Tell a health care provider about: Any allergies you have. All medicines you are taking, including vitamins, herbs, eye drops, creams, and over-the-counter medicines. Any problems you or family members have had with anesthetic medicines. Any bleeding problems you have. Any surgeries you have had. Any medical conditions you have. Whether you are pregnant or may be pregnant. What are the risks? This is a safe procedure. There are no known risks or complications of having this test. What happens before the procedure? Usually, a physical exam is done first. This may include: An exam of your breasts. Your health care provider may feel your breasts to check for abnormalities. An exam of your abdomen. Your health care provider may press on your abdomen to check for abnormalities. What happens during the procedure? Pelvic exams may vary among health care providers and hospitals. The following things usually take place during a pelvic exam: You will undress from the waist down. You will put on a gown or a wrap to cover yourself while you get ready for the exam. You will lie on your back on an exam table. You will place your feet into foot rests (stirrups) so that your legs are wide apart and your knees are bent. A drape will be placed over your abdomen and your legs. Your  health care provider will wear gloves and examine your outer genitals to check for anything unusual. This includes your clitoris, urethra, vaginal opening, labia, and the skin between your vagina and your anus (perineum). Your health care provider will examine your inner genitals. To do this, a lubricated instrument (speculum) will be inserted into your vagina. The speculum will be widened to open the walls of your vagina. Your health care provider will examine your vagina and cervix. A Pap test, cervical biopsy, or cultures may be done as needed. After the internal exam is done, the speculum will be removed. Your health care provider will insert two fingers into your vagina to gently press against various organs. Your health care provider may use his or her other hand to gently press on your lower abdomen while doing this. A pelvic exam is usually painless, although it can cause mild discomfort. If you experience pain at any time during your pelvic exam, tell your health care provider right away. Depending on the purpose of your pelvic exam, your health care provider may perform: A Pap test. This is sometimes called a Pap smear. It is a screening test that is used to check for signs of cancer of the cervix. The test can also identify the presence of infection or precancerous changes. A cervical biopsy. This is the removal of a small sample of tissue from the cervix. The cervix is the lowest part of the uterus, which opens into the vagina (birth canal). The tissue will be checked under a microscope. Other diagnostic tests that involve taking samples of tissue or fluid (cultures). What can I  expect after the procedure? You may have very light bleeding, particularly if a biopsy or cultures were obtained. It is up to you to get the results of your procedure. Ask your health care provider, or the department that is doing the procedure, when your results will be ready. Summary A pelvic exam is an exam of  a woman's outer and inner genitals and reproductive organs. A pelvic exam may be recommended to help explain or diagnose various problems with your pelvic organs. You may experience mild discomfort during a pelvic exam. This information is not intended to replace advice given to you by your health care provider. Make sure you discuss any questions you have with your health care provider. Document Revised: 08/17/2021 Document Reviewed: 03/04/2021 Elsevier Patient Education  [place].

## 2022-05-09 NOTE — Progress Notes (Addendum)
Subjective:     Maria Griffith is a 61 y.o. female and is here for a comprehensive physical exam. The patient reports problems - medication refill and muscle spasm on and off as well as f/u on other medical problems. She requested STD screen.  CHF/HTN/Aortic dilation: She denies chest pain, although she endorses intermittent chest heaviness and fatigue. She complied with all her meds and took her antihypertensive agents this morning.  Cervical Myelopathy: She is compliant with her Lyrica and Norco. She follows closely with her pain specialist.  Neutropenia: She asked the reason for her low WBC. No other concerns regarding that.  Muscle spasm: Worse at night time affecting her thigh and calf. She is compliant with her Tizanidine with minimal improvement.  Social History   Socioeconomic History   Marital status: Married    Spouse name: Not on file   Number of children: 3   Years of education: Not on file   Highest education level: Not on file  Occupational History   Occupation: bus driver    Employer: [employer]  Tobacco Use   Smoking status: Former    Types: Cigarettes   Smokeless tobacco: Never   Tobacco comments:    social quit yrs ago  Vaping Use   Vaping Use: Never used  Substance and Sexual Activity   Alcohol use: No   Drug use: No   Sexual activity: Yes    Birth control/protection: Post-menopausal  Other Topics Concern   Not on file  Social History Narrative   Pt lives with son   She notes some regular stressors in her life like paying bills.   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   Caffeine use: Soda and coffee sometimes   Right handed    Social Determinants of Health   Financial Resource Strain: Not on file  Food Insecurity: Not on file  Transportation Needs: Not on file  Physical Activity: Not on file  Stress: Not on file  Social Connections: Not on file  Intimate Partner Violence: Not on file   Health  Maintenance  Topic Date Due   Zoster Vaccines- Shingrix (1 of 2) Never done   COVID-19 Vaccine (4 - Booster for Pfizer series) 02/18/2021   PAP SMEAR-Modifier  04/02/2021   INFLUENZA VACCINE  07/18/2022   TETANUS/TDAP  03/08/2023   MAMMOGRAM  09/07/2023   COLONOSCOPY (Pts 45-76yr Insurance coverage will need to be confirmed)  09/06/2025   Hepatitis C Screening  Completed   HIV Screening  Completed   HPV VACCINES  Aged Out    The following portions of the patient's history were reviewed and updated as appropriate: allergies, current medications, past family history, past medical history, past social history, past surgical history, and problem list.  Review of Systems Pertinent items noted in HPI and remainder of comprehensive ROS otherwise negative.   Objective:   Vitals:   05/09/22 1040 05/09/22 1123 05/09/22 1133  BP: (!) 151/88 (!) 180/90 (!) 173/90  Pulse: (!) 107  88  SpO2: 98%    Weight: 188 lb 12.8 oz (85.6 kg)    Height: '5\' 1"'$  (1.549 m)        General appearance: alert and cooperative Head: Normocephalic, without obvious abnormality, atraumatic Eyes: conjunctivae/corneas clear. PERRL, EOM's intact. Fundi benign. Ears: normal TM's and external ear canals both ears Throat: lips, mucosa, and tongue normal; teeth and gums normal Lungs: clear to auscultation bilaterally Heart: regular rate and rhythm and diastolic  murmur: late diastolic 2/6,   Abdomen: soft, non-tender; bowel sounds normal; no masses,  no organomegaly Pelvic: No vaginal or cervical discharge. Cervical os difficult to visualize, likely due to post-LEEP status. The cervix and vagina bleed on contact (atrophic vaginitis). Otherwise, no other abnormal findings. Chaperone - Lavell Anchors Extremities: 1cm x 1 cm round healing ulcer on her left shin, a few inches below her knee. Pulses: 2+ and symmetric Skin: Skin color, texture, turgor normal. No rashes or lesions Neurologic: Grossly normal    Assessment:     Healthy female exam.   CHF/HTN/Aortic dilation:   Cervical Myelopathy:  Neutropenia:  Muscle spasm:    Plan:  PAP completed with HIV, RPR, GC/chlamydia as requested. I will contact her soon with her results. COVID-19 shot given. She is up to date with her colon and breast cancer screen. Mammogram is due in Sept.  No acute change. Cardiology note reviewed. Continue Lasix prn per cards and Losartan. She is not on Beta-blocker. I will defer that decision to her cardiologist. Planning for Aortic repair surgery.  Her BP remains elevated today despite compliance with her meds. Plan is to increase Hydralazine from 75 mg tid to 75 mg AM and PM and 100 mg QHS. Monitor BP closely at home - parameters given. F/U in 2 weeks for reassessment.  F/U ortho and pain management I advised her to discuss Lyrica dose with her pain management provider as a potential cause of her neutropenia.  She will f/u with hematology as recommended. She agreed with the plan.  Muscle spasm/Vit D deficiency and ulcer: Magnesium level and Vitamin D level checked. Shin ulcer is healing well. We will monitor for now.  See After Visit Summary for Counseling Recommendations

## 2022-05-09 NOTE — Assessment & Plan Note (Signed)
No acute change. Cardiology note reviewed. Continue Lasix prn per cards and Losartan. She is not on Beta-blocker. I will defer that decision to her cardiologist. Planning for Aortic repair surgery.

## 2022-05-09 NOTE — Assessment & Plan Note (Signed)
F/U ortho and pain management I advised her to discuss Lyrica dose with her pain management provider as a potential cause of her neutropenia.  She agreed with the plan.

## 2022-05-09 NOTE — Assessment & Plan Note (Signed)
Lab checked

## 2022-05-09 NOTE — Assessment & Plan Note (Signed)
Her BP remains elevated today despite compliance with her meds. Plan is to increase Hydralazine from 75 mg tid to 75 mg AM and PM and 100 mg QHS. Monitor BP closely at home - parameters given. F/U in 2 weeks for reassessment.

## 2022-05-09 NOTE — Assessment & Plan Note (Signed)
No acute change. Cardiology note reviewed. Continue Lasix prn per cards and Losartan. She is not on Beta-blocker. I will defer that decision to her cardiologist.

## 2022-05-09 NOTE — Assessment & Plan Note (Signed)
WBC remains low. She was evaluated by hematology whi suggested this is due to her Lyrica. I advised she f/u with pain specialist to discuss Lyrica dose adjustment as well as f/u with hematologist. She agreed with the plan.

## 2022-05-10 ENCOUNTER — Telehealth: Payer: Self-pay | Admitting: Family Medicine

## 2022-05-10 ENCOUNTER — Encounter: Payer: No Typology Code available for payment source | Admitting: Registered Nurse

## 2022-05-10 LAB — CERVICOVAGINAL ANCILLARY ONLY
Bacterial Vaginitis (gardnerella): NEGATIVE
Candida Glabrata: NEGATIVE
Candida Vaginitis: NEGATIVE
Chlamydia: NEGATIVE
Comment: NEGATIVE
Comment: NEGATIVE
Comment: NEGATIVE
Comment: NEGATIVE
Comment: NEGATIVE
Comment: NORMAL
Neisseria Gonorrhea: NEGATIVE
Trichomonas: NEGATIVE

## 2022-05-10 LAB — RPR: RPR Ser Ql: NONREACTIVE

## 2022-05-10 LAB — HIV ANTIBODY (ROUTINE TESTING W REFLEX): HIV Screen 4th Generation wRfx: NONREACTIVE

## 2022-05-10 LAB — MAGNESIUM: Magnesium: 2 mg/dL (ref 1.6–2.3)

## 2022-05-10 LAB — VITAMIN D 25 HYDROXY (VIT D DEFICIENCY, FRACTURES): Vit D, 25-Hydroxy: 29.6 ng/mL — ABNORMAL LOW (ref 30.0–100.0)

## 2022-05-10 NOTE — Telephone Encounter (Signed)
Test result discussed. I advised her to take OTC Vitamin D supplement for her mild vitamin D insufficiency. STD screen negative. She asked what she can use for vulva itchiness. This could be due to atrophic vaginitis due to estrogen insufficiency. She may trial OTC estroven and f/u soon if there is no improvement. She agreed with the plan.

## 2022-05-12 LAB — CYTOLOGY - PAP
Comment: NEGATIVE
Diagnosis: NEGATIVE
Diagnosis: REACTIVE
High risk HPV: NEGATIVE

## 2022-05-16 ENCOUNTER — Encounter: Payer: Self-pay | Admitting: Registered Nurse

## 2022-05-16 ENCOUNTER — Encounter: Payer: No Typology Code available for payment source | Attending: Registered Nurse | Admitting: Registered Nurse

## 2022-05-16 VITALS — BP 187/93 | HR 64 | Ht 61.0 in | Wt 193.2 lb

## 2022-05-16 DIAGNOSIS — M546 Pain in thoracic spine: Secondary | ICD-10-CM | POA: Diagnosis present

## 2022-05-16 DIAGNOSIS — M961 Postlaminectomy syndrome, not elsewhere classified: Secondary | ICD-10-CM | POA: Diagnosis present

## 2022-05-16 DIAGNOSIS — M4722 Other spondylosis with radiculopathy, cervical region: Secondary | ICD-10-CM | POA: Diagnosis not present

## 2022-05-16 DIAGNOSIS — M7918 Myalgia, other site: Secondary | ICD-10-CM | POA: Diagnosis present

## 2022-05-16 DIAGNOSIS — Z79891 Long term (current) use of opiate analgesic: Secondary | ICD-10-CM | POA: Insufficient documentation

## 2022-05-16 DIAGNOSIS — M542 Cervicalgia: Secondary | ICD-10-CM | POA: Diagnosis not present

## 2022-05-16 DIAGNOSIS — I10 Essential (primary) hypertension: Secondary | ICD-10-CM | POA: Diagnosis present

## 2022-05-16 DIAGNOSIS — R202 Paresthesia of skin: Secondary | ICD-10-CM | POA: Insufficient documentation

## 2022-05-16 DIAGNOSIS — M5412 Radiculopathy, cervical region: Secondary | ICD-10-CM | POA: Insufficient documentation

## 2022-05-16 DIAGNOSIS — G894 Chronic pain syndrome: Secondary | ICD-10-CM | POA: Diagnosis present

## 2022-05-16 DIAGNOSIS — M5416 Radiculopathy, lumbar region: Secondary | ICD-10-CM | POA: Diagnosis present

## 2022-05-16 DIAGNOSIS — Z5181 Encounter for therapeutic drug level monitoring: Secondary | ICD-10-CM | POA: Diagnosis present

## 2022-05-16 DIAGNOSIS — G8929 Other chronic pain: Secondary | ICD-10-CM | POA: Diagnosis present

## 2022-05-16 MED ORDER — PREGABALIN 100 MG PO CAPS: 100.0000 mg | ORAL_CAPSULE | Freq: Three times a day (TID) | ORAL | 3 refills | Status: DC

## 2022-05-16 MED ORDER — HYDROCODONE-ACETAMINOPHEN 7.5-325 MG PO TABS: ORAL_TABLET | ORAL | 0 refills | Status: DC

## 2022-05-16 MED ORDER — TIZANIDINE HCL 4 MG PO TABS: 4.0000 mg | ORAL_TABLET | Freq: Three times a day (TID) | ORAL | 3 refills | Status: DC
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
## 2022-05-18 ENCOUNTER — Ambulatory Visit (INDEPENDENT_AMBULATORY_CARE_PROVIDER_SITE_OTHER): Payer: Medicare Other | Admitting: Thoracic Surgery (Cardiothoracic Vascular Surgery)

## 2022-05-18 ENCOUNTER — Emergency Department (HOSPITAL_COMMUNITY)
Admission: EM | Admit: 2022-05-18 | Discharge: 2022-05-18 | Disposition: A | Discharge status: Home / Self Care | Payer: Medicare Other | Attending: Emergency Medicine | Admitting: Emergency Medicine

## 2022-05-18 ENCOUNTER — Other Ambulatory Visit: Payer: Self-pay

## 2022-05-18 ENCOUNTER — Encounter: Payer: Self-pay | Admitting: *Deleted

## 2022-05-18 ENCOUNTER — Encounter: Payer: Self-pay | Admitting: Thoracic Surgery (Cardiothoracic Vascular Surgery)

## 2022-05-18 ENCOUNTER — Emergency Department (HOSPITAL_COMMUNITY): Payer: Medicare Other

## 2022-05-18 ENCOUNTER — Other Ambulatory Visit: Payer: Self-pay | Admitting: *Deleted

## 2022-05-18 ENCOUNTER — Encounter (HOSPITAL_COMMUNITY): Payer: Self-pay

## 2022-05-18 VITALS — BP 231/117 | HR 87 | Resp 20 | Ht 61.0 in | Wt 193.0 lb

## 2022-05-18 DIAGNOSIS — Q2549 Other congenital malformations of aorta: Secondary | ICD-10-CM

## 2022-05-18 DIAGNOSIS — I509 Heart failure, unspecified: Secondary | ICD-10-CM | POA: Diagnosis not present

## 2022-05-18 DIAGNOSIS — I77819 Aortic ectasia, unspecified site: Secondary | ICD-10-CM | POA: Diagnosis not present

## 2022-05-18 DIAGNOSIS — R7989 Other specified abnormal findings of blood chemistry: Secondary | ICD-10-CM | POA: Diagnosis not present

## 2022-05-18 DIAGNOSIS — Z79899 Other long term (current) drug therapy: Secondary | ICD-10-CM | POA: Insufficient documentation

## 2022-05-18 DIAGNOSIS — Z7951 Long term (current) use of inhaled steroids: Secondary | ICD-10-CM | POA: Diagnosis not present

## 2022-05-18 DIAGNOSIS — J45909 Unspecified asthma, uncomplicated: Secondary | ICD-10-CM | POA: Diagnosis not present

## 2022-05-18 DIAGNOSIS — I10 Essential (primary) hypertension: Secondary | ICD-10-CM | POA: Diagnosis not present

## 2022-05-18 DIAGNOSIS — I160 Hypertensive urgency: Secondary | ICD-10-CM | POA: Insufficient documentation

## 2022-05-18 DIAGNOSIS — R079 Chest pain, unspecified: Secondary | ICD-10-CM | POA: Diagnosis present

## 2022-05-18 DIAGNOSIS — I110 Hypertensive heart disease with heart failure: Secondary | ICD-10-CM | POA: Insufficient documentation

## 2022-05-18 LAB — BASIC METABOLIC PANEL
Anion gap: 6 (ref 5–15)
BUN: 20 mg/dL (ref 6–20)
CO2: 28 mmol/L (ref 22–32)
Calcium: 9.2 mg/dL (ref 8.9–10.3)
Chloride: 104 mmol/L (ref 98–111)
Creatinine, Ser: 1.15 mg/dL — ABNORMAL HIGH (ref 0.44–1.00)
GFR, Estimated: 55 mL/min — ABNORMAL LOW (ref >60)
Glucose, Bld: 91 mg/dL (ref 70–99)
Potassium: 3.8 mmol/L (ref 3.5–5.1)
Sodium: 138 mmol/L (ref 135–145)

## 2022-05-18 LAB — CBC
HCT: 38.3 % (ref 36.0–46.0)
Hemoglobin: 12.3 g/dL (ref 12.0–15.0)
MCH: 28.7 pg (ref 26.0–34.0)
MCHC: 32.1 g/dL (ref 30.0–36.0)
MCV: 89.5 fL (ref 80.0–100.0)
Platelets: 245 10*3/uL (ref 150–400)
RBC: 4.28 MIL/uL (ref 3.87–5.11)
RDW: 13.2 % (ref 11.5–15.5)
WBC: 4 10*3/uL (ref 4.0–10.5)
nRBC: 0 % (ref 0.0–0.2)

## 2022-05-18 LAB — TROPONIN I (HIGH SENSITIVITY)
Troponin I (High Sensitivity): 9 ng/L (ref <18)
Troponin I (High Sensitivity): 10 ng/L (ref <18)

## 2022-05-18 MED ORDER — HYDRALAZINE HCL 25 MG PO TABS
50.0000 mg | ORAL_TABLET | Freq: Once | ORAL | Status: AC
Start: 2022-05-18 — End: 2022-05-18
  Administered 2022-05-18: 50 mg via ORAL
  Filled 2022-05-18: qty 2

## 2022-05-18 MED ORDER — LABETALOL HCL 5 MG/ML IV SOLN
10.0000 mg | Freq: Once | INTRAVENOUS | Status: AC
  Administered 2022-05-18: 10 mg via INTRAVENOUS
  Filled 2022-05-18: qty 4

## 2022-05-18 MED ORDER — LABETALOL HCL 5 MG/ML IV SOLN
10.0000 mg | Freq: Once | INTRAVENOUS | Status: DC
Start: 2022-05-18 — End: 2022-05-18

## 2022-05-18 NOTE — ED Notes (Signed)
RN reviewed discharge instructions w/ pt. Follow up and pain management reviewed, pt had no further questions. 
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
## 2022-05-18 NOTE — Discharge Instructions (Signed)
Your work-up today was reassuring.  Your blood pressure came down, I suspect the blood pressure was elevated likely because you are in so much pain from the dental procedure.  Continue to taking your pain medicine, continue taking your heart medicine.  Called call your primary doctor tomorrow with them know you are seen in the ED and see if they want to see you in the office for repeat evaluation.  Continue monitoring her blood pressure at home, if it becomes greater than 721 systolic need to return back to the ED for further evaluation.  Follow-up with Cardiothoracic surgeon as planned in 2 weeks unless things change or worsen.

## 2022-05-18 NOTE — ED Notes (Signed)
Basic labs sent  to main lab as save tubes

## 2022-05-18 NOTE — ED Provider Triage Note (Signed)
Emergency Medicine Provider Triage Evaluation Note  Maria Griffith , a 61 y.o. female  was evaluated in triage.  Pt complains of hypertension.  Patient had tooth extracted this morning, noted to be hypertensive there.  Patient then followed up with her cardiothoracic surgeon, Dr. Roxan Hockey, who also noted high blood pressure.  Patient has upcoming surgery in 3 weeks, open heart surgery.  Patient endorsing chest pain earlier today, shortness of breath, headache.  Review of Systems  Positive:  Negative:   Physical Exam  BP (!) 218/106   Pulse 78   Temp 98.9 F (37.2 C) (Oral)   Resp 16   Ht '5\' 2"'$  (1.575 m)   Wt 87.5 kg   LMP 10/22/2014 (Approximate)   SpO2 97%   BMI 35.30 kg/m  Gen:   Awake, no distress   Resp:  Normal effort  MSK:   Moves extremities without difficulty  Other:  No focal neurodeficits on examination.  Lung sounds clear.  Medical Decision Making  Medically screening exam initiated at 4:54 PM.  Appropriate orders placed.  Maria Griffith was informed that the remainder of the evaluation will be completed by another provider, this initial triage assessment does not replace that evaluation, and the importance of remaining in the ED until their evaluation is complete.     Azucena Cecil, PA-C 05/18/22 1655

## 2022-05-18 NOTE — ED Triage Notes (Signed)
Pt arrived POV from the drs office c/o hypertension. Pt states she had teeth removed this morning and it was high but has been going up all day and her heart doctor told her to come. Pt states she has a hx of an enlarged heart valve and is d/t have open heart surgery in 3 weeks.

## 2022-05-18 NOTE — ED Provider Notes (Signed)
[facility] EMERGENCY DEPARTMENT Provider Note   CSN: [id number] Arrival date & time: 05/18/22  1621     History  Chief Complaint  Patient presents with   Hypertension    Maria Griffith is a 61 y.o. female.   Hypertension   Patient with medical history of hypertension, asthma, aortic dilation, restrictive lung disease, CHF presents today due to hypertension and chest pain.  Patient was seen earlier today for dental extraction, was seen after that by her cardiothoracic surgeon for appointment regarding aortic dilation.  She endorses having chest pain which is central, sharp and radiates to her back.  She has had pain like this before but it is worse today.  Associated with nausea but no vomiting, she feels short of breath but denies any wheezing.  Was seen by her surgeon in the office who advised to go to ED for hypertensive urgency work-up.  Endorses headache which is improved at the ED stay.  Patient takes all of her blood pressure medicine, no missed.  Denies any syncopal events.  Home Medications Prior to Admission medications   Medication Sig Start Date End Date Taking? Authorizing Provider  albuterol (PROVENTIL) (2.5 MG/3ML) 0.083% nebulizer solution USE 1 vial via nebulizer EVERY 6 HOURS AS NEEDED FOR WHEEZING OR shortness OF breath 04/05/21   Kinnie [name], MD  albuterol (VENTOLIN HFA) 108 (90 Base) MCG/ACT inhaler Inhale 2 puffs into the lungs every 6 (six) hours as needed for wheezing or shortness of breath. 05/23/21   Kinnie [name], MD  benzonatate (TESSALON) 100 MG capsule Take 1 capsule (100 mg total) by mouth 2 (two) times daily as needed for cough. 07/29/20   Kinnie [name], MD  budesonide-formoterol (SYMBICORT) 160-4.5 MCG/ACT inhaler Inhale 2 puffs into the lungs 2 (two) times daily. 11/22/21   Kinnie [name], MD  chlorhexidine (PERIDEX) 0.12 % solution SMARTSIG:By Mouth 05/18/22   [provider]  Cholecalciferol (VITAMIN D3) 75  MCG (3000 UT) TABS Take 1 tablet by mouth daily. 04/06/20   Kinnie [name], MD  clindamycin (CLEOCIN) 300 MG capsule Take 300 mg by mouth 3 (three) times daily. 05/18/22   [provider]  diclofenac Sodium (VOLTAREN) 1 % GEL Apply 2 g topically 4 (four) times daily. Apply to shoulder 03/14/22   Kinnie [name], MD  fluticasone (FLONASE) 50 MCG/ACT nasal spray USE 2 SPRAYS IN EACH NOSTRIL ONCE A DAY 12/27/21   Kinnie [name], MD  furosemide (LASIX) 20 MG tablet Take 2 tablets (40 mg total) by mouth daily as needed. For swelling 12/27/21   Imogene Burn, PA-C  hydrALAZINE (APRESOLINE) 50 MG tablet Take 1.5 tablet in the AM and PM and take 2 tablets at night time. 05/09/22   Kinnie [name], MD  HYDROcodone-acetaminophen (NORCO) 7.5-325 MG tablet TAKE 1 TABLET BY MOUTH EVERY 6 HOURS AS NEEDED FOR MODERATE PAIN. Do Not Fill Before 06/ 27/2023 05/16/22   Bayard Hugger, NP  ibuprofen (ADVIL) 800 MG tablet Take by mouth. 05/18/22   [provider]  lidocaine (LIDODERM) 5 % Place 1 PATCH onto THE SKIN EVERY DAY AS NEEDED. REMOVE AND discard PATCH WITHIN 12 hours OR AS DIRECTED by doctor 04/14/22   Bayard Hugger, NP  loratadine (CLARITIN) 10 MG tablet TAKE 1 TABLET BY MOUTH EVERY DAY AS NEEDED FOR allergies 05/09/22   Kinnie [name], MD  losartan (COZAAR) 100 MG tablet Take 1 tablet (100 mg total) by mouth daily. 05/09/22 11/05/22  Kinnie [name], MD  oxybutynin (DITROPAN) 5 MG tablet Take 1 tablet (5 mg total) by mouth 2 (two) times daily. 05/09/22   Kinnie [name], MD  polyethylene glycol powder (GLYCOLAX/MIRALAX) 17 GM/SCOOP powder MIX 17 GRAMS in 8 OUNCES OF LIQUID AND DRINK EVERY DAY AS DIRECTED 05/04/22   Bayard Hugger, NP  pregabalin (LYRICA) 100 MG capsule Take 1 capsule (100 mg total) by mouth 3 (three) times daily. 05/16/22   Bayard Hugger, NP  SENNA PLUS 8.6-50 MG tablet TAKE 1 TABLET BY MOUTH AT BEDTIME 02/16/22   Bayard Hugger, NP  tiZANidine (ZANAFLEX) 4 MG  tablet Take 1 tablet (4 mg total) by mouth 3 (three) times daily. 05/16/22   Bayard Hugger, NP  traZODone (DESYREL) 150 MG tablet TAKE 1 TABLET BY MOUTH AT BEDTIME 10/31/21   Bayard Hugger, NP      Allergies    Aripiprazole, Compazine [prochlorperazine edisylate], Iohexol, Phenergan [promethazine hcl], Reglan [metoclopramide], Ondansetron hcl, and Zofran [ondansetron]    Review of Systems   Review of Systems  Physical Exam Updated Vital Signs BP (!) 154/78   Pulse 78   Temp 98.9 F (37.2 C) (Oral)   Resp 18   Ht '5\' 2"'$  (1.575 m)   Wt 87.5 kg   LMP 10/22/2014 (Approximate)   SpO2 95%   BMI 35.30 kg/m  Physical Exam Vitals and nursing note reviewed. Exam conducted with a chaperone present.  Constitutional:      Appearance: Normal appearance.  HENT:     Head: Normocephalic and atraumatic.  Eyes:     General: No scleral icterus.       Right eye: No discharge.        Left eye: No discharge.     Extraocular Movements: Extraocular movements intact.     Pupils: Pupils are equal, round, and reactive to light.  Cardiovascular:     Rate and Rhythm: Normal rate and regular rhythm.     Pulses: Normal pulses.     Heart sounds: Normal heart sounds. No murmur heard.   No friction rub. No gallop.  Pulmonary:     Effort: Pulmonary effort is normal. No respiratory distress.     Breath sounds: Decreased breath sounds present.  Abdominal:     General: Abdomen is flat. Bowel sounds are normal. There is no distension.     Palpations: Abdomen is soft.     Tenderness: There is no abdominal tenderness.  Musculoskeletal:     Comments: Left wrist in brace, moves upper and lower extremity  Skin:    General: Skin is warm and dry.     Coloration: Skin is not jaundiced.  Neurological:     Mental Status: She is alert. Mental status is at baseline.     Coordination: Coordination normal.     Comments: Cranial nerves III through XII grossly intact.  Grips ankle bilaterally, raises upper and  lower extremities without difficulty.  Not no dysarthria    ED Results / Procedures / Treatments   Labs (all labs ordered are listed, but only abnormal results are displayed) Labs Reviewed  BASIC METABOLIC PANEL - Abnormal; Notable for the following components:      Result Value   Creatinine, Ser 1.15 (*)    GFR, Estimated 55 (*)    All other components within normal limits  CBC  TROPONIN I (HIGH SENSITIVITY)  TROPONIN I (HIGH SENSITIVITY)    EKG EKG Interpretation  Date/Time:  Thursday May 18 2022 16:36:37  EDT Ventricular Rate:  79 PR Interval:  164 QRS Duration: 96 QT Interval:  394 QTC Calculation: 451 R Axis:   -39 Text Interpretation: Normal sinus rhythm Left axis deviation Left ventricular hypertrophy ( R in aVL , Cornell product , Romhilt-Estes ) Cannot rule out Septal infarct , age undetermined No significant change since last tracing When compared with ECG of 14-Jun-2020 08:48, PREVIOUS ECG IS PRESENT Confirmed by Blanchie Dessert [phone]) on 05/18/2022 9:41:13 PM  Radiology DG Chest 1 View  Result Date: 05/18/2022 CLINICAL DATA:  Hypertension. EXAM: CHEST  1 VIEW COMPARISON:  08/03/2017 FINDINGS: Cardiomegaly which may have progressed from 2018. Unchanged mediastinal contours. Subsegmental atelectasis/scar in the left mid lung, unchanged from prior. No pulmonary edema, pleural effusion, or pneumothorax. No confluent consolidation. Lumbar scoliosis partially included. Lower cervical spine hardware partially included IMPRESSION: 1. Cardiomegaly. 2. Subsegmental atelectasis/scar in the left mid lung, unchanged. Electronically Signed   By: Keith Rake M.D.   On: 05/18/2022 17:25    Procedures Procedures    Medications Ordered in ED Medications  labetalol (NORMODYNE) injection 10 mg (10 mg Intravenous Given 05/18/22 1845)  hydrALAZINE (APRESOLINE) tablet 50 mg (50 mg Oral Given 05/18/22 2024)    ED Course/ Medical Decision Making/ A&P                            Medical Decision Making Risk Prescription drug management.   This patient presents to the ED for concern of hypertension, this involves an extensive number of treatment options, and is a complaint that carries with it a high risk of complications and morbidity.  The differential diagnosis includes hypertensive emergency/urgency, aortic dissection, renal impairment, ACS, PE, pneumonia, CHF  Patient's presentation is complicated by their history of aortic dilation followed by cardiothoracic surgery.   Additional history obtained:   Independent historian: Patient's niece/daughter in room.  Reviewed external records including visit earlier today with cardiothoracic surgery.    Lab Tests:  I ordered, viewed, and personally interpreted labs.  The pertinent results include: No leukocytosis or anemia.  Negative delta Trope.  No gross electrolyte derangement, slight increase in creatinine at 1.15 compared to baseline.  Negative delta Trope    Imaging Studies ordered:  I directly visualized the chest x-ray, which showed cardiomegaly.  No widened mediastinum.  No underlying pneumonia noted.  I agree with the radiologist interpretation    ECG/Cardiac monitoring:   Per my interpretation, EKG shows sinus rhythm, LVH  The patient was maintained on a cardiac monitor.  Visualized monitor strip which showed sinus rhythm per my interpretation.    Medicines ordered and prescription drug management:  I ordered medication including: Hydralazine, 10 mg IV labetalol.   I have reviewed the patients home medicines and have made adjustments as needed   Test Considered:  Considered aortic dissection but no widened mediastinum on x-ray.  Chest pain is not severe, it also been more chronic in nature.  Radial pulses 2+, she is not On cardiac monitoring.  Presentation does not seem consistent with dissection based on my evaluation.  I also considered CT head but given she has a known history of  headaches, headaches been improving and there is no focal deficits I do not feel indicated at this time.  Low suspicion for [facility].    Consultations Obtained:  I requested consultation with the CT surgery Dr. Roxan Hockey.  Discussed lab and imaging findings as well as pertinent plan - they recommend: Their primary  concern in the office today with hypertension.  She was having chest pain but it appeared pretty benign at that time, states that the chest pain is worsening or patient appears uncomfortable proceed with dissection study otherwise work on hypertension management.   Reevaluation:  After the interventions noted above, I reevaluated the patient and found pain-free.  Blood pressure improved.   Problems addressed / ED Course: Chest pain-no ischemic findings on EKG, negative delta troponin.  Patient radial pulse 2+ equal bilaterally, overall well-appearing and the pain does not sound particularly like a dissection.  I considered dissection study but based on my evaluation I do not think it is indicated at this time.  Discussed with patient strict return precautions regarding chest pain and she verbalized understanding. Hypertension-patient's blood pressure improved with hydralazine and labetalol.  I suspect her elevated blood pressure was likely secondary to pain from the dental procedure.  I do not feel she needs admission for hypertensive emergency given there is no signs of endorgan damage.  Blood pressure also improved and responded to medicine, she is asymptomatic at this time.  Patient discharged in stable condition.   Social Determinants of Health: Has specialty care established   Disposition:   After consideration of the diagnostic results and the patients response to treatment, I feel that the patent would benefit from outpatient f/u.  Discussed HPI, physical exam and plan of care for this patient with attending [facility]. The attending physician evaluated this patient as  part of a shared visit and agrees with plan of care.          Final Clinical Impression(s) / ED Diagnoses Final diagnoses:  Hypertensive urgency    Rx / DC Orders ED Discharge Orders     None         Sherrill Raring, Hershal Coria 05/18/22 2155    Blanchie Dessert, MD 05/25/22 1640

## 2022-05-18 NOTE — ED Notes (Signed)
Pt took BP medication and notified Sort

## 2022-05-23 ENCOUNTER — Encounter: Payer: Self-pay | Admitting: *Deleted

## 2022-05-24 ENCOUNTER — Telehealth: Payer: Self-pay

## 2022-05-24 NOTE — Telephone Encounter (Signed)
Patient calls nurse line reporting a possible allergic reaction to Clindamycin.   Patient reports she had a dental procedure done and was given Clindamycin ~ 7 days ago.   Patient reports the first day of taking medication she felt "fine" and did not notice any side effects. Patient reports by day 2 and 3 she was having diarrhea and noticed her mouth and tongue were swollen.   Patient reports she continued to take the medication and finished course today.   Patient reports symptoms have not gone done.   Patient denies any SOB, rash, peeling or hives. No blood in stool. Patient denies fever or chills. No pain.   Patient scheduled for tomorrow for evaluation.   Strict precautions given to patient in the meantime.

## 2022-05-25 ENCOUNTER — Ambulatory Visit (INDEPENDENT_AMBULATORY_CARE_PROVIDER_SITE_OTHER): Payer: Medicare Other | Admitting: Family Medicine

## 2022-05-25 VITALS — BP 128/68 | HR 84 | Ht 61.0 in | Wt 191.0 lb

## 2022-05-25 DIAGNOSIS — Z889 Allergy status to unspecified drugs, medicaments and biological substances status: Secondary | ICD-10-CM | POA: Diagnosis not present

## 2022-05-25 MED ORDER — EPINEPHRINE 0.3 MG/0.3ML IJ SOAJ
0.3000 mg | INTRAMUSCULAR | 1 refills | Status: DC | PRN
Start: 2022-05-25 — End: 2024-06-19

## 2022-05-25 NOTE — Progress Notes (Signed)
    SUBJECTIVE:   CHIEF COMPLAINT / HPI:   Concern for possible medication adverse reaction: 61 year old female presenting after noting possible adverse/allergic reaction to clindamycin.  She had a dental procedure done was given clindamycin about 7 days ago.  She states she had a difficult and infected tooth extracted as the dental procedure. She notes that day 2 or day 3 of taking the clindamycin she started having diarrhea and noticed her tongue and mouth felt swollen.  She continue to take the medicine and finished the course on 05/24/2022.  She called into our office yesterday and noted that her symptoms had not resolved.  She was scheduled for an appointment for evaluation.  Today she states she has had hoarseness and felt as if the swelling in her mouth has been all over. She has had one bout of soft stool so far today. She has not been having watery diarrhea. No fevers. She states the swelling as been around the front of the mouth but also had some swelling of her tongue. She had hoarseness before the tooth extraction but got worse after. She states she does not have trouble swallowing but states that swallowing feels different than normal   PERTINENT  PMH / PSH: None relevant  OBJECTIVE:   BP 128/68   Pulse 84   Ht '5\' 1"'$  (1.549 m)   Wt 191 lb (86.6 kg)   LMP 10/22/2014 (Approximate)   SpO2 99%   BMI 36.09 kg/m    General: NAD, pleasant, able to participate in exam HEENT: Tongue mildly swollen with clear airway.  No cervical lymphadenopathy.  I can see where the tube is previously extracted in the front left aspect of her mouth on the superior aspect. Cardiac: RRR, no murmurs. Respiratory: CTAB, normal effort, No wheezes, rales or rhonchi Skin: warm and dry, no rashes noted Neuro: alert, no obvious focal deficits Psych: Normal affect and mood  ASSESSMENT/PLAN:   Concern for drug allergy: Started after the patient was on clindamycin with swelling of her mouth and tongue.  She  did not have any trouble breathing and continue taking the medicine for 3-4 more days but did note that she felt the swelling increased a little bit during this time.  She also had some diarrhea which has resolved.  On physical exam her tongue does look as though it may be mildly swollen with a clear airway.  I have not have the pleasure meeting this patient before so I am unsure her baseline with regard to her tongue.  I do think with the risk of potential anaphylaxis that is worth putting clindamycin as a drug allergy given this particular with the swelling of her tongue and some hoarseness that went along with it.  I will give the patient an EpiPen in case she was in contact with something else as the cause of her symptoms.  We will add clindamycin to her allergy list.  She has completed this course.  Recommended following up in 3 to 4 weeks if her hoarseness has not improved back to normal or sooner if she develops any other symptoms or concerns   Lurline Del, [place]

## 2022-05-25 NOTE — Patient Instructions (Signed)
Your symptoms do seem consistent with a drug allergy.  I have added clindamycin to your drug allergy list.  I am also prescribing an EpiPen in case there was anything else as the cause of your symptoms that you come in contact with.  I think your hoarseness will improve in the next 3 to 4 weeks.  If it does not or if it worsens please come back and see [place].  If you develop any other concerns please come back and see [place]

## 2022-05-30 ENCOUNTER — Ambulatory Visit: Payer: Medicare Other | Admitting: Family Medicine

## 2022-05-31 ENCOUNTER — Other Ambulatory Visit: Payer: Self-pay | Admitting: Family Medicine

## 2022-05-31 ENCOUNTER — Ambulatory Visit (INDEPENDENT_AMBULATORY_CARE_PROVIDER_SITE_OTHER): Payer: Medicare Other | Admitting: Thoracic Surgery (Cardiothoracic Vascular Surgery)

## 2022-05-31 ENCOUNTER — Encounter: Payer: Medicare Other | Admitting: Thoracic Surgery (Cardiothoracic Vascular Surgery)

## 2022-05-31 ENCOUNTER — Telehealth: Payer: Self-pay | Admitting: *Deleted

## 2022-05-31 VITALS — BP 159/90 | HR 90 | Resp 20 | Ht 61.0 in | Wt 190.0 lb

## 2022-05-31 DIAGNOSIS — I77819 Aortic ectasia, unspecified site: Secondary | ICD-10-CM

## 2022-05-31 NOTE — Telephone Encounter (Signed)
Mrs Rabalais called to let Zella Ball know that she has scheduled her surgery for next week.  We discussed her plan for pain medication: I told her our typical policy is that she will take the medication provided by her surgeon after the surgery in the post op period and once they release her she will return to our medication plan. [place] notified.
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
## 2022-06-02 NOTE — Pre-Procedure Instructions (Signed)
Surgical Instructions    Your procedure is scheduled on Wednesday June 21st.  Report to [facility] Main Entrance "A" at 06:30 A.M., then check in with the Admitting office.  Call this number if you have problems the morning of surgery:  [phone]   If you have any questions prior to your surgery date call [phone]: Open Monday-Friday 8am-4pm    Remember:  Do not eat or drink after midnight the night before your surgery     Take these medicines the morning of surgery with A SIP OF WATER  fluticasone (FLONASE)  hydrALAZINE (APRESOLINE) loratadine (CLARITIN) oxybutynin (DITROPAN)  pregabalin (LYRICA) tiZANidine (ZANAFLEX)  If needed: albuterol (PROVENTIL) (2.5 MG/3ML) albuterol (VENTOLIN HFA)- if needed, bring with you on day of surgery budesonide-formoterol (SYMBICORT) Carboxymethylcellul-Glycerin (LUBRICATING EYE DROPS OP) EPINEPHrine  HYDROcodone-acetaminophen (NORCO)    As of today, STOP taking any Aspirin (unless otherwise instructed by your surgeon) Aleve, Naproxen, Ibuprofen, Motrin, Advil, Goody's, BC's, all herbal medications, fish oil, and all vitamins. This includes your diclofenac Sodium (VOLTAREN) gel.                     Do NOT Smoke (Tobacco/Vaping) for 24 hours prior to your procedure.  If you use a CPAP at night, you may bring your mask/headgear for your overnight stay.   Contacts, glasses, piercing's, hearing aid's, dentures or partials may not be worn into surgery, please bring cases for these belongings.    For patients admitted to the hospital, discharge time will be determined by your treatment team.   Patients discharged the day of surgery will not be allowed to drive home, and someone needs to stay with them for 24 hours.  SURGICAL WAITING ROOM VISITATION Patients having surgery or a procedure may have two support people in the waiting room. These visitors may be switched out with other visitors if needed. Children under the age of 25 must  have an adult accompany them who is not the patient. If the patient needs to stay at the hospital during part of their recovery, the visitor guidelines for inpatient rooms apply.  Please refer to the [facility] website for the visitor guidelines for Inpatients (after your surgery is over and you are in a regular room).    Special instructions:   - Preparing For Surgery  Before surgery, you can play an important role. Because skin is not sterile, your skin needs to be as free of germs as possible. You can reduce the number of germs on your skin by washing with CHG (chlorahexidine gluconate) Soap before surgery.  CHG is an antiseptic cleaner which kills germs and bonds with the skin to continue killing germs even after washing.    Oral Hygiene is also important to reduce your risk of infection.  Remember - BRUSH YOUR TEETH THE MORNING OF SURGERY WITH YOUR REGULAR TOOTHPASTE  Please do not use if you have an allergy to CHG or antibacterial soaps. If your skin becomes reddened/irritated stop using the CHG.  Do not shave (including legs and underarms) for at least 48 hours prior to first CHG shower. It is OK to shave your face.  Please follow these instructions carefully.   Shower the NIGHT BEFORE SURGERY and the MORNING OF SURGERY  If you chose to wash your hair, wash your hair first as usual with your normal shampoo.  After you shampoo, rinse your hair and body thoroughly to remove the shampoo.  Use CHG Soap as you would any other liquid  soap. You can apply CHG directly to the skin and wash gently with a scrungie or a clean washcloth.   Apply the CHG Soap to your body ONLY FROM THE NECK DOWN.  Do not use on open wounds or open sores. Avoid contact with your eyes, ears, mouth and genitals (private parts). Wash Face and genitals (private parts)  with your normal soap.   Wash thoroughly, paying special attention to the area where your surgery will be performed.  Thoroughly rinse  your body with warm water from the neck down.  DO NOT shower/wash with your normal soap after using and rinsing off the CHG Soap.  Pat yourself dry with a CLEAN TOWEL.  Wear CLEAN PAJAMAS to bed the night before surgery  Place CLEAN SHEETS on your bed the night before your surgery  DO NOT SLEEP WITH PETS.   Day of Surgery: Take a shower with CHG soap. Do not wear jewelry or makeup Do not wear lotions, powders, perfumes, or deodorant. Do not shave 48 hours prior to surgery.   Do not bring valuables to the hospital. [facility] is not responsible for any belongings or valuables. Do not wear nail polish, gel polish, artificial nails, or any other type of covering on natural nails (fingers and toes) If you have artificial nails or gel coating that need to be removed by a nail salon, please have this removed prior to surgery. Artificial nails or gel coating may interfere with anesthesia's ability to adequately monitor your vital signs. Wear Clean/Comfortable clothing the morning of surgery Remember to brush your teeth WITH YOUR REGULAR TOOTHPASTE.   Please read over the following fact sheets that you were given.    If you received a COVID test during your pre-op visit  it is requested that you wear a mask when out in public, stay away from anyone that may not be feeling well and notify your surgeon if you develop symptoms. If you have been in contact with anyone that has tested positive in the last 10 days please notify you surgeon.

## 2022-06-05 ENCOUNTER — Other Ambulatory Visit: Payer: Self-pay

## 2022-06-05 ENCOUNTER — Ambulatory Visit (HOSPITAL_COMMUNITY)
Admission: RE | Admit: 2022-06-05 | Discharge: 2022-06-05 | Disposition: A | Discharge status: Home / Self Care | Payer: Medicare Other | Source: Ambulatory Visit | Attending: Thoracic Surgery (Cardiothoracic Vascular Surgery) | Admitting: Thoracic Surgery (Cardiothoracic Vascular Surgery)

## 2022-06-05 ENCOUNTER — Encounter (HOSPITAL_COMMUNITY): Payer: Self-pay

## 2022-06-05 ENCOUNTER — Encounter (HOSPITAL_COMMUNITY)
Admit: 2022-06-05 | Discharge: 2022-06-05 | Disposition: A | Discharge status: Home / Self Care | Payer: Medicare Other | Attending: Thoracic Surgery (Cardiothoracic Vascular Surgery) | Admitting: Thoracic Surgery (Cardiothoracic Vascular Surgery)

## 2022-06-05 ENCOUNTER — Ambulatory Visit (HOSPITAL_BASED_OUTPATIENT_CLINIC_OR_DEPARTMENT_OTHER)
Admit: 2022-06-05 | Discharge: 2022-06-05 | Disposition: A | Discharge status: Home / Self Care | Payer: Medicare Other | Attending: Thoracic Surgery (Cardiothoracic Vascular Surgery) | Admitting: Thoracic Surgery (Cardiothoracic Vascular Surgery)

## 2022-06-05 VITALS — BP 160/98 | HR 75 | Temp 97.6°F | Resp 17 | Ht 61.0 in | Wt 185.6 lb

## 2022-06-05 DIAGNOSIS — Z01810 Encounter for preprocedural cardiovascular examination: Secondary | ICD-10-CM | POA: Diagnosis not present

## 2022-06-05 DIAGNOSIS — E669 Obesity, unspecified: Secondary | ICD-10-CM | POA: Diagnosis present

## 2022-06-05 DIAGNOSIS — I351 Nonrheumatic aortic (valve) insufficiency: Secondary | ICD-10-CM | POA: Diagnosis not present

## 2022-06-05 DIAGNOSIS — G629 Polyneuropathy, unspecified: Secondary | ICD-10-CM | POA: Diagnosis not present

## 2022-06-05 DIAGNOSIS — Q2549 Other congenital malformations of aorta: Secondary | ICD-10-CM

## 2022-06-05 DIAGNOSIS — Z20822 Contact with and (suspected) exposure to covid-19: Secondary | ICD-10-CM | POA: Insufficient documentation

## 2022-06-05 DIAGNOSIS — K5900 Constipation, unspecified: Secondary | ICD-10-CM | POA: Diagnosis not present

## 2022-06-05 DIAGNOSIS — Z01818 Encounter for other preprocedural examination: Secondary | ICD-10-CM | POA: Insufficient documentation

## 2022-06-05 DIAGNOSIS — R7303 Prediabetes: Secondary | ICD-10-CM | POA: Insufficient documentation

## 2022-06-05 DIAGNOSIS — F419 Anxiety disorder, unspecified: Secondary | ICD-10-CM | POA: Diagnosis present

## 2022-06-05 DIAGNOSIS — I110 Hypertensive heart disease with heart failure: Secondary | ICD-10-CM | POA: Insufficient documentation

## 2022-06-05 DIAGNOSIS — G8929 Other chronic pain: Secondary | ICD-10-CM | POA: Diagnosis not present

## 2022-06-05 DIAGNOSIS — J90 Pleural effusion, not elsewhere classified: Secondary | ICD-10-CM | POA: Diagnosis not present

## 2022-06-05 DIAGNOSIS — N393 Stress incontinence (female) (male): Secondary | ICD-10-CM | POA: Diagnosis present

## 2022-06-05 DIAGNOSIS — D62 Acute posthemorrhagic anemia: Secondary | ICD-10-CM | POA: Diagnosis not present

## 2022-06-05 DIAGNOSIS — I719 Aortic aneurysm of unspecified site, without rupture: Secondary | ICD-10-CM | POA: Diagnosis not present

## 2022-06-05 DIAGNOSIS — Z9851 Tubal ligation status: Secondary | ICD-10-CM | POA: Diagnosis not present

## 2022-06-05 DIAGNOSIS — I088 Other rheumatic multiple valve diseases: Secondary | ICD-10-CM | POA: Diagnosis not present

## 2022-06-05 DIAGNOSIS — F32A Depression, unspecified: Secondary | ICD-10-CM | POA: Diagnosis not present

## 2022-06-05 DIAGNOSIS — J449 Chronic obstructive pulmonary disease, unspecified: Secondary | ICD-10-CM | POA: Diagnosis not present

## 2022-06-05 DIAGNOSIS — Z8679 Personal history of other diseases of the circulatory system: Secondary | ICD-10-CM | POA: Diagnosis not present

## 2022-06-05 DIAGNOSIS — R531 Weakness: Secondary | ICD-10-CM | POA: Diagnosis not present

## 2022-06-05 DIAGNOSIS — E559 Vitamin D deficiency, unspecified: Secondary | ICD-10-CM | POA: Diagnosis not present

## 2022-06-05 DIAGNOSIS — J9811 Atelectasis: Secondary | ICD-10-CM | POA: Diagnosis not present

## 2022-06-05 DIAGNOSIS — I5032 Chronic diastolic (congestive) heart failure: Secondary | ICD-10-CM | POA: Diagnosis not present

## 2022-06-05 DIAGNOSIS — Z8616 Personal history of COVID-19: Secondary | ICD-10-CM | POA: Diagnosis not present

## 2022-06-05 DIAGNOSIS — I509 Heart failure, unspecified: Secondary | ICD-10-CM | POA: Insufficient documentation

## 2022-06-05 DIAGNOSIS — Z87891 Personal history of nicotine dependence: Secondary | ICD-10-CM | POA: Diagnosis not present

## 2022-06-05 DIAGNOSIS — Z9889 Other specified postprocedural states: Secondary | ICD-10-CM | POA: Diagnosis not present

## 2022-06-05 DIAGNOSIS — Z79899 Other long term (current) drug therapy: Secondary | ICD-10-CM | POA: Diagnosis not present

## 2022-06-05 DIAGNOSIS — I7121 Aneurysm of the ascending aorta, without rupture: Secondary | ICD-10-CM | POA: Diagnosis not present

## 2022-06-05 DIAGNOSIS — Z7951 Long term (current) use of inhaled steroids: Secondary | ICD-10-CM | POA: Diagnosis not present

## 2022-06-05 DIAGNOSIS — N179 Acute kidney failure, unspecified: Secondary | ICD-10-CM | POA: Diagnosis not present

## 2022-06-05 DIAGNOSIS — J45909 Unspecified asthma, uncomplicated: Secondary | ICD-10-CM | POA: Diagnosis not present

## 2022-06-05 LAB — URINALYSIS, ROUTINE W REFLEX MICROSCOPIC
Bilirubin Urine: NEGATIVE
Glucose, UA: NEGATIVE mg/dL
Hgb urine dipstick: NEGATIVE
Ketones, ur: NEGATIVE mg/dL
Leukocytes,Ua: NEGATIVE
Nitrite: NEGATIVE
Protein, ur: NEGATIVE mg/dL
Specific Gravity, Urine: 1.015 (ref 1.005–1.030)
pH: 6 (ref 5.0–8.0)

## 2022-06-05 LAB — COMPREHENSIVE METABOLIC PANEL
ALT: 17 U/L (ref 0–44)
AST: 23 U/L (ref 15–41)
Albumin: 3.9 g/dL (ref 3.5–5.0)
Alkaline Phosphatase: 53 U/L (ref 38–126)
Anion gap: 12 (ref 5–15)
BUN: 11 mg/dL (ref 6–20)
CO2: 24 mmol/L (ref 22–32)
Calcium: 9.1 mg/dL (ref 8.9–10.3)
Chloride: 103 mmol/L (ref 98–111)
Creatinine, Ser: 0.55 mg/dL (ref 0.44–1.00)
GFR, Estimated: >60 mL/min (ref >60)
Glucose, Bld: 63 mg/dL — ABNORMAL LOW (ref 70–99)
Potassium: 3.3 mmol/L — ABNORMAL LOW (ref 3.5–5.1)
Sodium: 139 mmol/L (ref 135–145)
Total Bilirubin: 0.4 mg/dL (ref 0.3–1.2)
Total Protein: 7.3 g/dL (ref 6.5–8.1)

## 2022-06-05 LAB — CBC
HCT: 36 % (ref 36.0–46.0)
Hemoglobin: 11.8 g/dL — ABNORMAL LOW (ref 12.0–15.0)
MCH: 33.8 pg (ref 26.0–34.0)
MCHC: 32.8 g/dL (ref 30.0–36.0)
MCV: 103.2 fL — ABNORMAL HIGH (ref 80.0–100.0)
Platelets: 223 10*3/uL (ref 150–400)
RBC: 3.49 MIL/uL — ABNORMAL LOW (ref 3.87–5.11)
RDW: 15.4 % (ref 11.5–15.5)
WBC: 10.4 10*3/uL (ref 4.0–10.5)
nRBC: 0 % (ref 0.0–0.2)

## 2022-06-05 LAB — HEMOGLOBIN A1C
Hgb A1c MFr Bld: 6.2 % — ABNORMAL HIGH (ref 4.8–5.6)
Mean Plasma Glucose: 131.24 mg/dL

## 2022-06-05 LAB — BLOOD GAS, ARTERIAL
Acid-Base Excess: 4.2 mmol/L — ABNORMAL HIGH (ref 0.0–2.0)
Bicarbonate: 28.4 mmol/L — ABNORMAL HIGH (ref 20.0–28.0)
Drawn by: 58793
O2 Saturation: 99 %
Patient temperature: 37
pCO2 arterial: 40 mmHg (ref 32–48)
pH, Arterial: 7.46 — ABNORMAL HIGH (ref 7.35–7.45)
pO2, Arterial: 78 mmHg — ABNORMAL LOW (ref 83–108)

## 2022-06-05 LAB — SURGICAL PCR SCREEN
MRSA, PCR: NEGATIVE
Staphylococcus aureus: NEGATIVE

## 2022-06-05 LAB — APTT: aPTT: 33 seconds (ref 24–36)

## 2022-06-05 LAB — PROTIME-INR
INR: 1 (ref 0.8–1.2)
Prothrombin Time: 13.1 seconds (ref 11.4–15.2)

## 2022-06-05 LAB — SARS CORONAVIRUS 2 (TAT 6-24 HRS): SARS Coronavirus 2: NEGATIVE

## 2022-06-05 NOTE — Progress Notes (Signed)
PCP - Dr. Andrena Mews, MD Cardiologist - Dr. Angelena Form  PPM/ICD - n/a  Chest x-ray - 06/05/22 EKG - 05/19/22 Stress Test - 02/28/17 ECHO - 10/10/21 Cardiac Cath - denies  Sleep Study - denies CPAP - denies  Blood Thinner Instructions: n/a Aspirin Instructions: n/a  NPO  COVID TEST- 06/05/22; done in PAT  Anesthesia review: Yes, heart history  Patient denies shortness of breath, fever, cough and chest pain at PAT appointment   All instructions explained to the patient, with a verbal understanding of the material. Patient agrees to go over the instructions while at home for a better understanding. Patient also instructed to self quarantine after being tested for COVID-19. The opportunity to ask questions was provided.

## 2022-06-05 NOTE — Progress Notes (Signed)
VASCULAR LAB    Pre CABG Dopplers have been performed.  See CV proc for preliminary results.   Esaul Dorwart, RVT 06/05/2022, 10:48 AM

## 2022-06-06 MED ORDER — POTASSIUM CHLORIDE 2 MEQ/ML IV SOLN
80.0000 meq | INTRAVENOUS | Status: DC
  Filled 2022-06-06: qty 40

## 2022-06-06 MED ORDER — TRANEXAMIC ACID (OHS) PUMP PRIME SOLUTION
2.0000 mg/kg | INTRAVENOUS | Status: DC
  Filled 2022-06-06: qty 1.68

## 2022-06-06 MED ORDER — MAGNESIUM SULFATE 50 % IJ SOLN
40.0000 meq | INTRAMUSCULAR | Status: DC
  Filled 2022-06-06: qty 9.85

## 2022-06-06 MED ORDER — EPINEPHRINE HCL 5 MG/250ML IV SOLN IN NS
0.0000 ug/min | INTRAVENOUS | Status: DC
  Filled 2022-06-06: qty 250

## 2022-06-06 MED ORDER — DEXMEDETOMIDINE HCL IN NACL 400 MCG/100ML IV SOLN
0.1000 ug/kg/h | INTRAVENOUS | Status: AC
  Administered 2022-06-07: .4 ug/kg/h via INTRAVENOUS
  Filled 2022-06-06: qty 100

## 2022-06-06 MED ORDER — MILRINONE LACTATE IN DEXTROSE 20-5 MG/100ML-% IV SOLN
0.3000 ug/kg/min | INTRAVENOUS | Status: DC
  Filled 2022-06-06: qty 100

## 2022-06-06 MED ORDER — TRANEXAMIC ACID (OHS) BOLUS VIA INFUSION
15.0000 mg/kg | INTRAVENOUS | Status: AC
  Administered 2022-06-07: 1263 mg via INTRAVENOUS
  Filled 2022-06-06: qty 1263

## 2022-06-06 MED ORDER — HEPARIN 30,000 UNITS/1000 ML (OHS) CELLSAVER SOLUTION
Status: DC
  Filled 2022-06-06: qty 1000

## 2022-06-06 MED ORDER — INSULIN REGULAR(HUMAN) IN NACL 100-0.9 UT/100ML-% IV SOLN
INTRAVENOUS | Status: AC
  Administered 2022-06-07: 1.3 [IU]/h via INTRAVENOUS
  Filled 2022-06-06: qty 100

## 2022-06-06 MED ORDER — CEFAZOLIN SODIUM-DEXTROSE 2-4 GM/100ML-% IV SOLN
2.0000 g | INTRAVENOUS | Status: AC
  Administered 2022-06-07: 2 g via INTRAVENOUS
  Filled 2022-06-06: qty 100

## 2022-06-06 MED ORDER — NITROGLYCERIN IN D5W 200-5 MCG/ML-% IV SOLN
2.0000 ug/min | INTRAVENOUS | Status: AC
  Administered 2022-06-07: 5 ug/min via INTRAVENOUS
  Filled 2022-06-06: qty 250

## 2022-06-06 MED ORDER — TRANEXAMIC ACID 1000 MG/10ML IV SOLN
1.5000 mg/kg/h | INTRAVENOUS | Status: AC
  Administered 2022-06-07: 1.5 mg/kg/h via INTRAVENOUS
  Filled 2022-06-06: qty 25

## 2022-06-06 MED ORDER — VANCOMYCIN HCL 1500 MG/300ML IV SOLN
1500.0000 mg | INTRAVENOUS | Status: AC
  Administered 2022-06-07: 1500 mg via INTRAVENOUS
  Filled 2022-06-06: qty 300

## 2022-06-06 MED ORDER — PHENYLEPHRINE HCL-NACL 20-0.9 MG/250ML-% IV SOLN
30.0000 ug/min | INTRAVENOUS | Status: AC
  Administered 2022-06-07: 20 ug/min via INTRAVENOUS
  Filled 2022-06-06: qty 250

## 2022-06-06 MED ORDER — MANNITOL 20 % IV SOLN
INTRAVENOUS | Status: DC
  Filled 2022-06-06: qty 13

## 2022-06-06 MED ORDER — NOREPINEPHRINE 4 MG/250ML-% IV SOLN
0.0000 ug/min | INTRAVENOUS | Status: DC
  Filled 2022-06-06: qty 250

## 2022-06-06 MED ORDER — PLASMA-LYTE A IV SOLN
INTRAVENOUS | Status: DC
  Filled 2022-06-06: qty 2.5

## 2022-06-06 NOTE — Anesthesia Preprocedure Evaluation (Signed)
Anesthesia Evaluation  Patient identified by MRN, date of birth, ID band Patient awake    Reviewed: Allergy & Precautions, NPO status , Patient's Chart, lab work & pertinent test results  History of Anesthesia Complications (+) POST - OP SPINAL HEADACHE and history of anesthetic complications  Airway Mallampati: II  TM Distance: >3 FB Neck ROM: Full    Dental no notable dental hx.    Pulmonary asthma ,  COPD inhaler, former smoker,    Pulmonary exam normal        Cardiovascular hypertension, Pt. on medications +CHF   Rhythm:Regular Rate:Normal  SOV aneurysm  ECHO 2022: IMPRESSIONS  1. There is moderate to severe AI. Tricuspid aortic valve. Regurgitation  secondary to aortic root aneurysm (48 mm). LVIDs 3.4 cm and LVIDd 5.3 cm.  There is concern for diastolic flow reversal in the descending aorta, but  VTI of this signal ~12 cm.  Overall, suspect this is closer to moderate, but if there are concerns for  severe AI, would consider TEE vs. CMR for quantitation. The aortic valve  is tricuspid. Aortic valve regurgitation is moderate to severe.  2. Aneurysm of the aortic root and ascending aorta. Would recommend  cross-sectional imaging for clarification. Aortic dilatation noted.  Aneurysm of the aortic root, measuring 48 mm. Aneurysm of the ascending  aorta, measuring 44 mm.  3. Left ventricular ejection fraction, by estimation, is 55 to 60%. The  left ventricle has normal function. The left ventricle has no regional  wall motion abnormalities. There is mild concentric left ventricular  hypertrophy. Left ventricular diastolic  parameters were normal.  4. Right ventricular systolic function is normal. The right ventricular  size is normal. There is normal pulmonary artery systolic pressure. The  estimated right ventricular systolic pressure is 86.5 mmHg.  5. The mitral valve is grossly normal. Trivial mitral valve   regurgitation. No evidence of mitral stenosis.  6. The inferior vena cava is normal in size with greater than 50%  respiratory variability, suggesting right atrial pressure of 3 mmHg.   Comparison(s): Changes from prior study are noted. Moderate to severe AI.  Aortic root/Asc Ao Aneurysm.    Neuro/Psych  Headaches, Anxiety Depression    GI/Hepatic negative GI ROS, Neg liver ROS,   Endo/Other  negative endocrine ROS  Renal/GU negative Renal ROS  negative genitourinary   Musculoskeletal  (+) Arthritis , Osteoarthritis,    Abdominal Normal abdominal exam  (+)   Peds  Hematology  (+) Blood dyscrasia, anemia , Lab Results      Component                Value               Date                      WBC                      10.4                06/05/2022                HGB                      11.8 (L)            06/05/2022                HCT  36.0                06/05/2022                MCV                      103.2 (H)           06/05/2022                PLT                      223                 06/05/2022           Lab Results      Component                Value               Date                      NA                       139                 06/05/2022                K                        3.3 (L)             06/05/2022                CO2                      24                  06/05/2022                GLUCOSE                  63 (L)              06/05/2022                BUN                      11                  06/05/2022                CREATININE               0.55                06/05/2022                CALCIUM                  9.1                 06/05/2022                EGFR                     76  12/27/2021                GFRNONAA                 >60                 06/05/2022             Anesthesia Other Findings   Reproductive/Obstetrics                            Anesthesia  Physical Anesthesia Plan  ASA: 4  Anesthesia Plan: General   Post-op Pain Management:    Induction: Intravenous  PONV Risk Score and Plan: 3 and Midazolam and Treatment may vary due to age or medical condition  Airway Management Planned: Mask and Oral ETT  Additional Equipment: Arterial line, CVP, PA Cath, TEE, 3D TEE and Ultrasound Guidance Line Placement  Intra-op Plan:   Post-operative Plan: Post-operative intubation/ventilation  Informed Consent: I have reviewed the patients History and Physical, chart, labs and discussed the procedure including the risks, benefits and alternatives for the proposed anesthesia with the patient or authorized representative who has indicated his/her understanding and acceptance.     Dental advisory given  Plan Discussed with: CRNA  Anesthesia Plan Comments: (CT coronary 01/23: IMPRESSION: 1. Coronary calcium score of 0.  2. Normal coronary origin with right dominance.  3. Minimal non-calcified plaque in the first diagonal and RCA (<25%).  4. Mid LAD myocardial bridge (normal variant).  5. Sinus of Valsalva aneurysm up to 50 mm (cusp to cusp NCC-LCC, double oblique measurement). Similar in dimensions to recent cardiac MRI.  Cardiac MRI: IMPRESSION: There is severe aortic dilation at the level of the sinus of Valsalva.  There is moderate aortic regurgitation by flow quantification.  There is moderate increase in left ventricular size.  Cardiac CT surgery may be considered if clinically indicated.)       Anesthesia Quick Evaluation

## 2022-06-07 ENCOUNTER — Inpatient Hospital Stay (HOSPITAL_COMMUNITY): Payer: Medicare Other

## 2022-06-07 ENCOUNTER — Inpatient Hospital Stay (HOSPITAL_COMMUNITY)
Admission: RE | Disposition: A | Discharge status: Home / Self Care | Payer: Self-pay | Source: Home / Self Care | Attending: Thoracic Surgery (Cardiothoracic Vascular Surgery)

## 2022-06-07 ENCOUNTER — Inpatient Hospital Stay (HOSPITAL_COMMUNITY)
Admission: RE | Admit: 2022-06-07 | Discharge: 2022-06-14 | DRG: 220 | Disposition: A | Discharge status: Home Health | Payer: Medicare Other | Attending: Thoracic Surgery (Cardiothoracic Vascular Surgery) | Admitting: Thoracic Surgery (Cardiothoracic Vascular Surgery)

## 2022-06-07 ENCOUNTER — Other Ambulatory Visit: Payer: Self-pay

## 2022-06-07 ENCOUNTER — Inpatient Hospital Stay (HOSPITAL_COMMUNITY): Payer: Medicare Other | Admitting: Certified Registered Nurse Anesthetist

## 2022-06-07 ENCOUNTER — Inpatient Hospital Stay (HOSPITAL_COMMUNITY): Payer: Medicare Other | Admitting: Emergency Medicine

## 2022-06-07 DIAGNOSIS — Z20822 Contact with and (suspected) exposure to covid-19: Secondary | ICD-10-CM | POA: Diagnosis not present

## 2022-06-07 DIAGNOSIS — Z8616 Personal history of COVID-19: Secondary | ICD-10-CM | POA: Diagnosis not present

## 2022-06-07 DIAGNOSIS — N393 Stress incontinence (female) (male): Secondary | ICD-10-CM | POA: Diagnosis present

## 2022-06-07 DIAGNOSIS — I351 Nonrheumatic aortic (valve) insufficiency: Secondary | ICD-10-CM | POA: Diagnosis not present

## 2022-06-07 DIAGNOSIS — G8929 Other chronic pain: Secondary | ICD-10-CM | POA: Diagnosis present

## 2022-06-07 DIAGNOSIS — D62 Acute posthemorrhagic anemia: Secondary | ICD-10-CM | POA: Diagnosis not present

## 2022-06-07 DIAGNOSIS — E559 Vitamin D deficiency, unspecified: Secondary | ICD-10-CM | POA: Diagnosis present

## 2022-06-07 DIAGNOSIS — Z79899 Other long term (current) drug therapy: Secondary | ICD-10-CM | POA: Diagnosis not present

## 2022-06-07 DIAGNOSIS — F32A Depression, unspecified: Secondary | ICD-10-CM | POA: Diagnosis present

## 2022-06-07 DIAGNOSIS — J45909 Unspecified asthma, uncomplicated: Secondary | ICD-10-CM | POA: Diagnosis not present

## 2022-06-07 DIAGNOSIS — Z87891 Personal history of nicotine dependence: Secondary | ICD-10-CM

## 2022-06-07 DIAGNOSIS — I509 Heart failure, unspecified: Secondary | ICD-10-CM | POA: Diagnosis not present

## 2022-06-07 DIAGNOSIS — Z9851 Tubal ligation status: Secondary | ICD-10-CM | POA: Diagnosis not present

## 2022-06-07 DIAGNOSIS — I110 Hypertensive heart disease with heart failure: Secondary | ICD-10-CM | POA: Diagnosis present

## 2022-06-07 DIAGNOSIS — J90 Pleural effusion, not elsewhere classified: Secondary | ICD-10-CM | POA: Diagnosis not present

## 2022-06-07 DIAGNOSIS — I7121 Aneurysm of the ascending aorta, without rupture: Principal | ICD-10-CM | POA: Diagnosis present

## 2022-06-07 DIAGNOSIS — F419 Anxiety disorder, unspecified: Secondary | ICD-10-CM | POA: Diagnosis present

## 2022-06-07 DIAGNOSIS — Z7951 Long term (current) use of inhaled steroids: Secondary | ICD-10-CM | POA: Diagnosis not present

## 2022-06-07 DIAGNOSIS — I5032 Chronic diastolic (congestive) heart failure: Secondary | ICD-10-CM | POA: Diagnosis present

## 2022-06-07 DIAGNOSIS — Q2543 Congenital aneurysm of aorta: Secondary | ICD-10-CM

## 2022-06-07 DIAGNOSIS — N179 Acute kidney failure, unspecified: Secondary | ICD-10-CM | POA: Diagnosis not present

## 2022-06-07 DIAGNOSIS — G629 Polyneuropathy, unspecified: Secondary | ICD-10-CM | POA: Diagnosis present

## 2022-06-07 DIAGNOSIS — Q2549 Other congenital malformations of aorta: Secondary | ICD-10-CM

## 2022-06-07 DIAGNOSIS — J449 Chronic obstructive pulmonary disease, unspecified: Secondary | ICD-10-CM | POA: Diagnosis not present

## 2022-06-07 DIAGNOSIS — K5900 Constipation, unspecified: Secondary | ICD-10-CM | POA: Diagnosis present

## 2022-06-07 DIAGNOSIS — I088 Other rheumatic multiple valve diseases: Secondary | ICD-10-CM | POA: Diagnosis not present

## 2022-06-07 DIAGNOSIS — J9811 Atelectasis: Secondary | ICD-10-CM | POA: Diagnosis not present

## 2022-06-07 DIAGNOSIS — R140 Abdominal distension (gaseous): Secondary | ICD-10-CM | POA: Diagnosis not present

## 2022-06-07 DIAGNOSIS — Z6836 Body mass index (BMI) 36.0-36.9, adult: Secondary | ICD-10-CM

## 2022-06-07 DIAGNOSIS — J4540 Moderate persistent asthma, uncomplicated: Secondary | ICD-10-CM | POA: Diagnosis present

## 2022-06-07 DIAGNOSIS — R7303 Prediabetes: Secondary | ICD-10-CM | POA: Diagnosis present

## 2022-06-07 DIAGNOSIS — E669 Obesity, unspecified: Secondary | ICD-10-CM | POA: Diagnosis present

## 2022-06-07 DIAGNOSIS — Z8679 Personal history of other diseases of the circulatory system: Principal | ICD-10-CM

## 2022-06-07 DIAGNOSIS — R531 Weakness: Secondary | ICD-10-CM | POA: Diagnosis not present

## 2022-06-07 DIAGNOSIS — Z9889 Other specified postprocedural states: Principal | ICD-10-CM

## 2022-06-07 DIAGNOSIS — I719 Aortic aneurysm of unspecified site, without rupture: Secondary | ICD-10-CM | POA: Diagnosis not present

## 2022-06-07 HISTORY — PX: TEE WITHOUT CARDIOVERSION: SHX5443

## 2022-06-07 HISTORY — PX: ASCENDING AORTIC ROOT REPLACEMENT: SHX5729

## 2022-06-07 LAB — GLUCOSE, CAPILLARY
Glucose-Capillary: 120 mg/dL — ABNORMAL HIGH (ref 70–99)
Glucose-Capillary: 125 mg/dL — ABNORMAL HIGH (ref 70–99)
Glucose-Capillary: 118 mg/dL — ABNORMAL HIGH (ref 70–99)
Glucose-Capillary: 144 mg/dL — ABNORMAL HIGH (ref 70–99)
Glucose-Capillary: 155 mg/dL — ABNORMAL HIGH (ref 70–99)
Glucose-Capillary: 150 mg/dL — ABNORMAL HIGH (ref 70–99)
Glucose-Capillary: 151 mg/dL — ABNORMAL HIGH (ref 70–99)
Glucose-Capillary: 147 mg/dL — ABNORMAL HIGH (ref 70–99)
Glucose-Capillary: 151 mg/dL — ABNORMAL HIGH (ref 70–99)
Glucose-Capillary: 120 mg/dL — ABNORMAL HIGH (ref 70–99)

## 2022-06-07 LAB — POCT I-STAT 7, (LYTES, BLD GAS, ICA,H+H)
Acid-Base Excess: 2 mmol/L (ref 0.0–2.0)
Acid-Base Excess: 2 mmol/L (ref 0.0–2.0)
Acid-base deficit: 2 mmol/L (ref 0.0–2.0)
Acid-base deficit: 4 mmol/L — ABNORMAL HIGH (ref 0.0–2.0)
Acid-base deficit: 5 mmol/L — ABNORMAL HIGH (ref 0.0–2.0)
Acid-base deficit: 6 mmol/L — ABNORMAL HIGH (ref 0.0–2.0)
Bicarbonate: 19.9 mmol/L — ABNORMAL LOW (ref 20.0–28.0)
Bicarbonate: 21 mmol/L (ref 20.0–28.0)
Bicarbonate: 21.3 mmol/L (ref 20.0–28.0)
Bicarbonate: 21.6 mmol/L (ref 20.0–28.0)
Bicarbonate: 25.5 mmol/L (ref 20.0–28.0)
Bicarbonate: 25.8 mmol/L (ref 20.0–28.0)
Calcium, Ion: 0.99 mmol/L — ABNORMAL LOW (ref 1.15–1.40)
Calcium, Ion: 1.04 mmol/L — ABNORMAL LOW (ref 1.15–1.40)
Calcium, Ion: 1.09 mmol/L — ABNORMAL LOW (ref 1.15–1.40)
Calcium, Ion: 1.12 mmol/L — ABNORMAL LOW (ref 1.15–1.40)
Calcium, Ion: 1.17 mmol/L (ref 1.15–1.40)
Calcium, Ion: 1.23 mmol/L (ref 1.15–1.40)
HCT: 23 % — ABNORMAL LOW (ref 36.0–46.0)
HCT: 24 % — ABNORMAL LOW (ref 36.0–46.0)
HCT: 25 % — ABNORMAL LOW (ref 36.0–46.0)
HCT: 27 % — ABNORMAL LOW (ref 36.0–46.0)
HCT: 28 % — ABNORMAL LOW (ref 36.0–46.0)
HCT: 28 % — ABNORMAL LOW (ref 36.0–46.0)
Hemoglobin: 7.8 g/dL — ABNORMAL LOW (ref 12.0–15.0)
Hemoglobin: 8.2 g/dL — ABNORMAL LOW (ref 12.0–15.0)
Hemoglobin: 8.5 g/dL — ABNORMAL LOW (ref 12.0–15.0)
Hemoglobin: 9.2 g/dL — ABNORMAL LOW (ref 12.0–15.0)
Hemoglobin: 9.5 g/dL — ABNORMAL LOW (ref 12.0–15.0)
Hemoglobin: 9.5 g/dL — ABNORMAL LOW (ref 12.0–15.0)
O2 Saturation: 100 %
O2 Saturation: 100 %
O2 Saturation: 100 %
O2 Saturation: 94 %
O2 Saturation: 96 %
O2 Saturation: 96 %
Patient temperature: 35.9
Patient temperature: 36.5
Patient temperature: 37
Patient temperature: 37.7
Potassium: 3.7 mmol/L (ref 3.5–5.1)
Potassium: 3.9 mmol/L (ref 3.5–5.1)
Potassium: 4.1 mmol/L (ref 3.5–5.1)
Potassium: 4.2 mmol/L (ref 3.5–5.1)
Potassium: 4.3 mmol/L (ref 3.5–5.1)
Potassium: 4.6 mmol/L (ref 3.5–5.1)
Sodium: 139 mmol/L (ref 135–145)
Sodium: 140 mmol/L (ref 135–145)
Sodium: 140 mmol/L (ref 135–145)
Sodium: 140 mmol/L (ref 135–145)
Sodium: 140 mmol/L (ref 135–145)
Sodium: 142 mmol/L (ref 135–145)
TCO2: 21 mmol/L — ABNORMAL LOW (ref 22–32)
TCO2: 22 mmol/L (ref 22–32)
TCO2: 23 mmol/L (ref 22–32)
TCO2: 23 mmol/L (ref 22–32)
TCO2: 27 mmol/L (ref 22–32)
TCO2: 27 mmol/L (ref 22–32)
pCO2 arterial: 28.8 mmHg — ABNORMAL LOW (ref 32–48)
pCO2 arterial: 34.4 mmHg (ref 32–48)
pCO2 arterial: 35.1 mmHg (ref 32–48)
pCO2 arterial: 38.7 mmHg (ref 32–48)
pCO2 arterial: 42.3 mmHg (ref 32–48)
pCO2 arterial: 42.6 mmHg (ref 32–48)
pH, Arterial: 7.28 — ABNORMAL LOW (ref 7.35–7.45)
pH, Arterial: 7.303 — ABNORMAL LOW (ref 7.35–7.45)
pH, Arterial: 7.346 — ABNORMAL LOW (ref 7.35–7.45)
pH, Arterial: 7.474 — ABNORMAL HIGH (ref 7.35–7.45)
pH, Arterial: 7.478 — ABNORMAL HIGH (ref 7.35–7.45)
pH, Arterial: 7.479 — ABNORMAL HIGH (ref 7.35–7.45)
pO2, Arterial: 209 mmHg — ABNORMAL HIGH (ref 83–108)
pO2, Arterial: 241 mmHg — ABNORMAL HIGH (ref 83–108)
pO2, Arterial: 379 mmHg — ABNORMAL HIGH (ref 83–108)
pO2, Arterial: 81 mmHg — ABNORMAL LOW (ref 83–108)
pO2, Arterial: 91 mmHg (ref 83–108)
pO2, Arterial: 92 mmHg (ref 83–108)

## 2022-06-07 LAB — POCT I-STAT EG7
Acid-Base Excess: 1 mmol/L (ref 0.0–2.0)
Bicarbonate: 25.2 mmol/L (ref 20.0–28.0)
Calcium, Ion: 1.07 mmol/L — ABNORMAL LOW (ref 1.15–1.40)
HCT: 24 % — ABNORMAL LOW (ref 36.0–46.0)
Hemoglobin: 8.2 g/dL — ABNORMAL LOW (ref 12.0–15.0)
O2 Saturation: 79 %
Potassium: 3.3 mmol/L — ABNORMAL LOW (ref 3.5–5.1)
Sodium: 141 mmol/L (ref 135–145)
TCO2: 26 mmol/L (ref 22–32)
pCO2, Ven: 38.3 mmHg — ABNORMAL LOW (ref 44–60)
pH, Ven: 7.426 (ref 7.25–7.43)
pO2, Ven: 42 mmHg (ref 32–45)

## 2022-06-07 LAB — POCT I-STAT, CHEM 8
BUN: 20 mg/dL (ref 6–20)
BUN: 19 mg/dL (ref 6–20)
BUN: 20 mg/dL (ref 6–20)
BUN: 19 mg/dL (ref 6–20)
BUN: 21 mg/dL — ABNORMAL HIGH (ref 6–20)
Calcium, Ion: 1.14 mmol/L — ABNORMAL LOW (ref 1.15–1.40)
Calcium, Ion: 1.15 mmol/L (ref 1.15–1.40)
Calcium, Ion: 1.07 mmol/L — ABNORMAL LOW (ref 1.15–1.40)
Calcium, Ion: 0.99 mmol/L — ABNORMAL LOW (ref 1.15–1.40)
Calcium, Ion: 1.07 mmol/L — ABNORMAL LOW (ref 1.15–1.40)
Chloride: 103 mmol/L (ref 98–111)
Chloride: 104 mmol/L (ref 98–111)
Chloride: 103 mmol/L (ref 98–111)
Chloride: 102 mmol/L (ref 98–111)
Chloride: 104 mmol/L (ref 98–111)
Creatinine, Ser: 0.8 mg/dL (ref 0.44–1.00)
Creatinine, Ser: 0.8 mg/dL (ref 0.44–1.00)
Creatinine, Ser: 0.9 mg/dL (ref 0.44–1.00)
Creatinine, Ser: 0.9 mg/dL (ref 0.44–1.00)
Creatinine, Ser: 0.9 mg/dL (ref 0.44–1.00)
Glucose, Bld: 123 mg/dL — ABNORMAL HIGH (ref 70–99)
Glucose, Bld: 97 mg/dL (ref 70–99)
Glucose, Bld: 110 mg/dL — ABNORMAL HIGH (ref 70–99)
Glucose, Bld: 131 mg/dL — ABNORMAL HIGH (ref 70–99)
Glucose, Bld: 148 mg/dL — ABNORMAL HIGH (ref 70–99)
HCT: 29 % — ABNORMAL LOW (ref 36.0–46.0)
HCT: 27 % — ABNORMAL LOW (ref 36.0–46.0)
HCT: 25 % — ABNORMAL LOW (ref 36.0–46.0)
HCT: 19 % — ABNORMAL LOW (ref 36.0–46.0)
HCT: 24 % — ABNORMAL LOW (ref 36.0–46.0)
Hemoglobin: 9.9 g/dL — ABNORMAL LOW (ref 12.0–15.0)
Hemoglobin: 9.2 g/dL — ABNORMAL LOW (ref 12.0–15.0)
Hemoglobin: 8.5 g/dL — ABNORMAL LOW (ref 12.0–15.0)
Hemoglobin: 6.5 g/dL — CL (ref 12.0–15.0)
Hemoglobin: 8.2 g/dL — ABNORMAL LOW (ref 12.0–15.0)
Potassium: 3.3 mmol/L — ABNORMAL LOW (ref 3.5–5.1)
Potassium: 3.5 mmol/L (ref 3.5–5.1)
Potassium: 4.7 mmol/L (ref 3.5–5.1)
Potassium: 4.1 mmol/L (ref 3.5–5.1)
Potassium: 4.5 mmol/L (ref 3.5–5.1)
Sodium: 140 mmol/L (ref 135–145)
Sodium: 138 mmol/L (ref 135–145)
Sodium: 138 mmol/L (ref 135–145)
Sodium: 139 mmol/L (ref 135–145)
Sodium: 140 mmol/L (ref 135–145)
TCO2: 23 mmol/L (ref 22–32)
TCO2: 24 mmol/L (ref 22–32)
TCO2: 25 mmol/L (ref 22–32)
TCO2: 24 mmol/L (ref 22–32)
TCO2: 25 mmol/L (ref 22–32)

## 2022-06-07 LAB — BASIC METABOLIC PANEL
Anion gap: 8 (ref 5–15)
BUN: 19 mg/dL (ref 6–20)
CO2: 21 mmol/L — ABNORMAL LOW (ref 22–32)
Calcium: 8.1 mg/dL — ABNORMAL LOW (ref 8.9–10.3)
Chloride: 109 mmol/L (ref 98–111)
Creatinine, Ser: 1.21 mg/dL — ABNORMAL HIGH (ref 0.44–1.00)
GFR, Estimated: 52 mL/min — ABNORMAL LOW (ref >60)
Glucose, Bld: 153 mg/dL — ABNORMAL HIGH (ref 70–99)
Potassium: 4.7 mmol/L (ref 3.5–5.1)
Sodium: 138 mmol/L (ref 135–145)

## 2022-06-07 LAB — PROTIME-INR
INR: 1.6 — ABNORMAL HIGH (ref 0.8–1.2)
Prothrombin Time: 18.9 seconds — ABNORMAL HIGH (ref 11.4–15.2)

## 2022-06-07 LAB — CBC
HCT: 29 % — ABNORMAL LOW (ref 36.0–46.0)
HCT: 29.9 % — ABNORMAL LOW (ref 36.0–46.0)
Hemoglobin: 9.7 g/dL — ABNORMAL LOW (ref 12.0–15.0)
Hemoglobin: 9.9 g/dL — ABNORMAL LOW (ref 12.0–15.0)
MCH: 29.4 pg (ref 26.0–34.0)
MCH: 29.5 pg (ref 26.0–34.0)
MCHC: 33.4 g/dL (ref 30.0–36.0)
MCHC: 33.1 g/dL (ref 30.0–36.0)
MCV: 87.9 fL (ref 80.0–100.0)
MCV: 89 fL (ref 80.0–100.0)
Platelets: 102 10*3/uL — ABNORMAL LOW (ref 150–400)
Platelets: 151 10*3/uL (ref 150–400)
RBC: 3.3 MIL/uL — ABNORMAL LOW (ref 3.87–5.11)
RBC: 3.36 MIL/uL — ABNORMAL LOW (ref 3.87–5.11)
RDW: 13.2 % (ref 11.5–15.5)
RDW: 13.3 % (ref 11.5–15.5)
WBC: 6 10*3/uL (ref 4.0–10.5)
WBC: 7.6 10*3/uL (ref 4.0–10.5)
nRBC: 0 % (ref 0.0–0.2)
nRBC: 0 % (ref 0.0–0.2)

## 2022-06-07 LAB — PLATELET COUNT: Platelets: 162 10*3/uL (ref 150–400)

## 2022-06-07 LAB — HEMOGLOBIN AND HEMATOCRIT, BLOOD
HCT: 22.8 % — ABNORMAL LOW (ref 36.0–46.0)
Hemoglobin: 7.9 g/dL — ABNORMAL LOW (ref 12.0–15.0)

## 2022-06-07 LAB — POCT ACTIVATED CLOTTING TIME
Activated Clotting Time: 138 seconds
Activated Clotting Time: 483 seconds
Activated Clotting Time: 600 seconds
Activated Clotting Time: 434 seconds
Activated Clotting Time: 523 seconds
Activated Clotting Time: 132 seconds
Activated Clotting Time: 398 seconds

## 2022-06-07 LAB — APTT: aPTT: 37 seconds — ABNORMAL HIGH (ref 24–36)

## 2022-06-07 LAB — MAGNESIUM: Magnesium: 3.6 mg/dL — ABNORMAL HIGH (ref 1.7–2.4)

## 2022-06-07 LAB — PREPARE RBC (CROSSMATCH)

## 2022-06-07 SURGERY — ASCENDING AORTIC ROOT REPLACEMENT
Anesthesia: General

## 2022-06-07 MED ORDER — SODIUM CHLORIDE 0.9 % IV SOLN: 250.0000 mL | INTRAVENOUS | Status: DC

## 2022-06-07 MED ORDER — LACTATED RINGERS IV SOLN: INTRAVENOUS | Status: DC | PRN

## 2022-06-07 MED ORDER — OXYCODONE HCL 5 MG PO TABS
5.0000 mg | ORAL_TABLET | ORAL | Status: DC | PRN
  Administered 2022-06-08 – 2022-06-09 (×5): 10 mg via ORAL
  Administered 2022-06-09: 5 mg via ORAL
  Administered 2022-06-10 (×3): 10 mg via ORAL
  Administered 2022-06-10 (×2): 5 mg via ORAL
  Administered 2022-06-10 – 2022-06-14 (×12): 10 mg via ORAL
  Filled 2022-06-07 (×5): qty 2
  Filled 2022-06-07: qty 1
  Filled 2022-06-07 (×9): qty 2
  Filled 2022-06-07: qty 1
  Filled 2022-06-07 (×7): qty 2

## 2022-06-07 MED ORDER — HEPARIN SODIUM (PORCINE) 1000 UNIT/ML IJ SOLN
INTRAMUSCULAR | Status: AC
Start: 2022-06-07 — End: ?
  Filled 2022-06-07: qty 1

## 2022-06-07 MED ORDER — FENTANYL CITRATE (PF) 250 MCG/5ML IJ SOLN
INTRAMUSCULAR | Status: AC
  Filled 2022-06-07: qty 5

## 2022-06-07 MED ORDER — SODIUM CHLORIDE 0.9 % IV SOLN: 10.0000 mL/h | Freq: Once | INTRAVENOUS | Status: DC

## 2022-06-07 MED ORDER — FAMOTIDINE IN NACL 20-0.9 MG/50ML-% IV SOLN
20.0000 mg | Freq: Two times a day (BID) | INTRAVENOUS | Status: AC
  Administered 2022-06-07: 20 mg via INTRAVENOUS
  Filled 2022-06-07 (×2): qty 50

## 2022-06-07 MED ORDER — MAGNESIUM SULFATE 4 GM/100ML IV SOLN
4.0000 g | Freq: Once | INTRAVENOUS | Status: AC
  Administered 2022-06-07: 4 g via INTRAVENOUS
  Filled 2022-06-07: qty 100

## 2022-06-07 MED ORDER — SUCCINYLCHOLINE CHLORIDE 200 MG/10ML IV SOSY: PREFILLED_SYRINGE | INTRAVENOUS | Status: DC | PRN

## 2022-06-07 MED ORDER — SODIUM CHLORIDE 0.9% FLUSH: 3.0000 mL | INTRAVENOUS | Status: DC | PRN

## 2022-06-07 MED ORDER — POLYVINYL ALCOHOL 1.4 % OP SOLN: 1.0000 [drp] | OPHTHALMIC | Status: DC | PRN

## 2022-06-07 MED ORDER — MOMETASONE FURO-FORMOTEROL FUM 200-5 MCG/ACT IN AERO
2.0000 | INHALATION_SPRAY | Freq: Two times a day (BID) | RESPIRATORY_TRACT | Status: DC
  Administered 2022-06-08 – 2022-06-14 (×11): 2 via RESPIRATORY_TRACT
  Filled 2022-06-07 (×2): qty 8.8

## 2022-06-07 MED ORDER — DEXTROSE 50 % IV SOLN
0.0000 mL | INTRAVENOUS | Status: DC | PRN
  Administered 2022-06-08: 50 mL via INTRAVENOUS
  Filled 2022-06-07: qty 50

## 2022-06-07 MED ORDER — MORPHINE SULFATE (PF) 2 MG/ML IV SOLN
1.0000 mg | INTRAVENOUS | Status: DC | PRN
  Administered 2022-06-07 (×2): 1 mg via INTRAVENOUS
  Administered 2022-06-07: 4 mg via INTRAVENOUS
  Administered 2022-06-07: 3 mg via INTRAVENOUS
  Administered 2022-06-08 (×3): 4 mg via INTRAVENOUS
  Administered 2022-06-08: 2 mg via INTRAVENOUS
  Administered 2022-06-08: 4 mg via INTRAVENOUS
  Administered 2022-06-09: 2 mg via INTRAVENOUS
  Filled 2022-06-07: qty 1
  Filled 2022-06-07 (×3): qty 2
  Filled 2022-06-07: qty 1
  Filled 2022-06-07 (×4): qty 2

## 2022-06-07 MED ORDER — DOCUSATE SODIUM 100 MG PO CAPS
200.0000 mg | ORAL_CAPSULE | Freq: Every day | ORAL | Status: DC
  Administered 2022-06-08 – 2022-06-11 (×4): 200 mg via ORAL
  Filled 2022-06-07 (×6): qty 2

## 2022-06-07 MED ORDER — PREGABALIN 100 MG PO CAPS
100.0000 mg | ORAL_CAPSULE | Freq: Three times a day (TID) | ORAL | Status: DC
  Administered 2022-06-08 – 2022-06-14 (×19): 100 mg via ORAL
  Filled 2022-06-07: qty 2
  Filled 2022-06-07 (×2): qty 1
  Filled 2022-06-07 (×2): qty 2
  Filled 2022-06-07: qty 1
  Filled 2022-06-07 (×2): qty 2
  Filled 2022-06-07: qty 1
  Filled 2022-06-07: qty 2
  Filled 2022-06-07: qty 1
  Filled 2022-06-07: qty 2
  Filled 2022-06-07: qty 1
  Filled 2022-06-07 (×2): qty 2
  Filled 2022-06-07 (×2): qty 1
  Filled 2022-06-07 (×2): qty 2

## 2022-06-07 MED ORDER — SODIUM CHLORIDE 0.45 % IV SOLN: INTRAVENOUS | Status: DC | PRN

## 2022-06-07 MED ORDER — ACETAMINOPHEN 160 MG/5ML PO SOLN: 1000.0000 mg | Freq: Four times a day (QID) | ORAL | Status: AC

## 2022-06-07 MED ORDER — LACTATED RINGERS IV SOLN: INTRAVENOUS | Status: DC

## 2022-06-07 MED ORDER — PROPOFOL 10 MG/ML IV BOLUS
INTRAVENOUS | Status: DC | PRN
  Administered 2022-06-07: 20 mg via INTRAVENOUS
  Administered 2022-06-07 (×2): 50 mg via INTRAVENOUS

## 2022-06-07 MED ORDER — SODIUM CHLORIDE (PF) 0.9 % IJ SOLN
OROMUCOSAL | Status: DC | PRN
  Administered 2022-06-07: 4 mL via TOPICAL
  Administered 2022-06-07: 8 mL via TOPICAL

## 2022-06-07 MED ORDER — HEPARIN SODIUM (PORCINE) 1000 UNIT/ML IJ SOLN
INTRAMUSCULAR | Status: DC | PRN
  Administered 2022-06-07: 27000 [IU] via INTRAVENOUS

## 2022-06-07 MED ORDER — DEXMEDETOMIDINE HCL IN NACL 400 MCG/100ML IV SOLN
0.0000 ug/kg/h | INTRAVENOUS | Status: DC
  Administered 2022-06-07: 0.2 ug/kg/h via INTRAVENOUS
  Filled 2022-06-07: qty 100

## 2022-06-07 MED ORDER — PLASMA-LYTE A IV SOLN
INTRAVENOUS | Status: DC | PRN
  Administered 2022-06-07: 500 mL via INTRAVASCULAR

## 2022-06-07 MED ORDER — ~~LOC~~ CARDIAC SURGERY, PATIENT & FAMILY EDUCATION
Freq: Once | Status: DC
  Filled 2022-06-07: qty 1

## 2022-06-07 MED ORDER — MIDAZOLAM HCL (PF) 5 MG/ML IJ SOLN
INTRAMUSCULAR | Status: DC | PRN
  Administered 2022-06-07: 2 mg via INTRAVENOUS
  Administered 2022-06-07: 1 mg via INTRAVENOUS
  Administered 2022-06-07: 2 mg via INTRAVENOUS
  Administered 2022-06-07: 1 mg via INTRAVENOUS

## 2022-06-07 MED ORDER — SODIUM CHLORIDE 0.9% FLUSH
3.0000 mL | Freq: Two times a day (BID) | INTRAVENOUS | Status: DC
  Administered 2022-06-08 – 2022-06-10 (×5): 3 mL via INTRAVENOUS

## 2022-06-07 MED ORDER — METOPROLOL TARTRATE 25 MG/10 ML ORAL SUSPENSION
12.5000 mg | Freq: Two times a day (BID) | ORAL | Status: DC
  Administered 2022-06-07: 12.5 mg
  Filled 2022-06-07: qty 10

## 2022-06-07 MED ORDER — ASPIRIN 325 MG PO TBEC
325.0000 mg | DELAYED_RELEASE_TABLET | Freq: Every day | ORAL | Status: DC
  Administered 2022-06-08 – 2022-06-14 (×7): 325 mg via ORAL
  Filled 2022-06-07 (×7): qty 1

## 2022-06-07 MED ORDER — CALCIUM CHLORIDE 10 % IV SOLN
1.0000 g | Freq: Once | INTRAVENOUS | Status: AC
Start: 2022-06-07 — End: 2022-06-07
  Administered 2022-06-07: 1 g via INTRAVENOUS

## 2022-06-07 MED ORDER — CHLORHEXIDINE GLUCONATE CLOTH 2 % EX PADS
6.0000 | MEDICATED_PAD | Freq: Every day | CUTANEOUS | Status: DC
  Administered 2022-06-07 – 2022-06-10 (×4): 6 via TOPICAL

## 2022-06-07 MED ORDER — NICARDIPINE HCL IN NACL 20-0.86 MG/200ML-% IV SOLN
3.0000 mg/h | INTRAVENOUS | Status: DC
  Administered 2022-06-07: 7.5 mg/h via INTRAVENOUS
  Administered 2022-06-07 – 2022-06-08 (×2): 5 mg/h via INTRAVENOUS
  Filled 2022-06-07 (×4): qty 200

## 2022-06-07 MED ORDER — NITROGLYCERIN IN D5W 200-5 MCG/ML-% IV SOLN
0.0000 ug/min | INTRAVENOUS | Status: DC
  Administered 2022-06-08: 50 ug/min via INTRAVENOUS
  Filled 2022-06-07: qty 250

## 2022-06-07 MED ORDER — ROCURONIUM BROMIDE 10 MG/ML (PF) SYRINGE
PREFILLED_SYRINGE | INTRAVENOUS | Status: DC | PRN
  Administered 2022-06-07: 30 mg via INTRAVENOUS
  Administered 2022-06-07 (×3): 50 mg via INTRAVENOUS
  Administered 2022-06-07: 70 mg via INTRAVENOUS

## 2022-06-07 MED ORDER — MIDAZOLAM HCL (PF) 10 MG/2ML IJ SOLN
INTRAMUSCULAR | Status: AC
Start: 2022-06-07 — End: ?
  Filled 2022-06-07: qty 2

## 2022-06-07 MED ORDER — ACETAMINOPHEN 650 MG RE SUPP
650.0000 mg | Freq: Once | RECTAL | Status: AC
  Administered 2022-06-07: 650 mg via RECTAL

## 2022-06-07 MED ORDER — PROTAMINE SULFATE 10 MG/ML IV SOLN
INTRAVENOUS | Status: AC
  Filled 2022-06-07: qty 25

## 2022-06-07 MED ORDER — MIDAZOLAM HCL 2 MG/2ML IJ SOLN
2.0000 mg | INTRAMUSCULAR | Status: DC | PRN
Start: 2022-06-07 — End: 2022-06-08

## 2022-06-07 MED ORDER — METOPROLOL TARTRATE 12.5 MG HALF TABLET
12.5000 mg | ORAL_TABLET | Freq: Once | ORAL | Status: AC
  Administered 2022-06-07: 12.5 mg via ORAL
  Filled 2022-06-07: qty 1

## 2022-06-07 MED ORDER — PHENYLEPHRINE HCL-NACL 20-0.9 MG/250ML-% IV SOLN: 0.0000 ug/min | INTRAVENOUS | Status: DC

## 2022-06-07 MED ORDER — CHLORHEXIDINE GLUCONATE 0.12 % MT SOLN: 15.0000 mL | OROMUCOSAL | Status: AC

## 2022-06-07 MED ORDER — POTASSIUM CHLORIDE 10 MEQ/50ML IV SOLN
10.0000 meq | INTRAVENOUS | Status: AC
  Administered 2022-06-07 (×3): 10 meq via INTRAVENOUS

## 2022-06-07 MED ORDER — SODIUM CHLORIDE 0.9 % IV SOLN: INTRAVENOUS | Status: DC

## 2022-06-07 MED ORDER — ROCURONIUM BROMIDE 10 MG/ML (PF) SYRINGE
PREFILLED_SYRINGE | INTRAVENOUS | Status: AC
  Filled 2022-06-07: qty 30

## 2022-06-07 MED ORDER — CARBOXYMETHYLCELLUL-GLYCERIN 0.5-0.9 % OP SOLN: 1.0000 [drp] | Freq: Two times a day (BID) | OPHTHALMIC | Status: DC | PRN

## 2022-06-07 MED ORDER — BISACODYL 10 MG RE SUPP: 10.0000 mg | Freq: Every day | RECTAL | Status: DC

## 2022-06-07 MED ORDER — PROPOFOL 10 MG/ML IV BOLUS
INTRAVENOUS | Status: AC
Start: 2022-06-07 — End: ?
  Filled 2022-06-07: qty 20

## 2022-06-07 MED ORDER — METOPROLOL TARTRATE 5 MG/5ML IV SOLN
2.5000 mg | INTRAVENOUS | Status: DC | PRN
  Administered 2022-06-10 (×2): 5 mg via INTRAVENOUS
  Administered 2022-06-13: 3 mg via INTRAVENOUS
  Filled 2022-06-07 (×2): qty 5

## 2022-06-07 MED ORDER — CHLORHEXIDINE GLUCONATE 0.12 % MT SOLN
15.0000 mL | Freq: Once | OROMUCOSAL | Status: DC
  Filled 2022-06-07: qty 15

## 2022-06-07 MED ORDER — CHLORHEXIDINE GLUCONATE 4 % EX LIQD: 30.0000 mL | CUTANEOUS | Status: DC

## 2022-06-07 MED ORDER — FENTANYL CITRATE (PF) 250 MCG/5ML IJ SOLN
INTRAMUSCULAR | Status: DC | PRN
  Administered 2022-06-07: 100 ug via INTRAVENOUS
  Administered 2022-06-07 (×2): 150 ug via INTRAVENOUS
  Administered 2022-06-07 (×2): 50 ug via INTRAVENOUS
  Administered 2022-06-07 (×5): 100 ug via INTRAVENOUS
  Administered 2022-06-07: 50 ug via INTRAVENOUS
  Administered 2022-06-07 (×2): 100 ug via INTRAVENOUS

## 2022-06-07 MED ORDER — ALBUMIN HUMAN 5 % IV SOLN: INTRAVENOUS | Status: DC | PRN

## 2022-06-07 MED ORDER — CHLORHEXIDINE GLUCONATE 0.12 % MT SOLN
OROMUCOSAL | Status: AC
  Filled 2022-06-07: qty 15

## 2022-06-07 MED ORDER — ALBUMIN HUMAN 5 % IV SOLN
250.0000 mL | INTRAVENOUS | Status: AC | PRN
  Administered 2022-06-07 (×4): 12.5 g via INTRAVENOUS
  Filled 2022-06-07 (×2): qty 250

## 2022-06-07 MED ORDER — CEFAZOLIN SODIUM-DEXTROSE 2-4 GM/100ML-% IV SOLN
2.0000 g | Freq: Three times a day (TID) | INTRAVENOUS | Status: AC
  Administered 2022-06-07 – 2022-06-09 (×6): 2 g via INTRAVENOUS
  Filled 2022-06-07 (×6): qty 100

## 2022-06-07 MED ORDER — PROTAMINE SULFATE 10 MG/ML IV SOLN
INTRAVENOUS | Status: DC | PRN
  Administered 2022-06-07: 250 mg via INTRAVENOUS

## 2022-06-07 MED ORDER — SODIUM CHLORIDE 0.9 % IR SOLN
Status: DC | PRN
  Administered 2022-06-07: 1000 mL
  Administered 2022-06-07: 5000 mL

## 2022-06-07 MED ORDER — PANTOPRAZOLE SODIUM 40 MG PO TBEC
40.0000 mg | DELAYED_RELEASE_TABLET | Freq: Every day | ORAL | Status: DC
  Administered 2022-06-09 – 2022-06-14 (×6): 40 mg via ORAL
  Filled 2022-06-07 (×7): qty 1

## 2022-06-07 MED ORDER — SODIUM BICARBONATE 8.4 % IV SOLN
50.0000 meq | Freq: Once | INTRAVENOUS | Status: AC
  Administered 2022-06-07: 50 meq via INTRAVENOUS

## 2022-06-07 MED ORDER — ASPIRIN 81 MG PO CHEW
324.0000 mg | CHEWABLE_TABLET | Freq: Every day | ORAL | Status: DC
  Filled 2022-06-07: qty 4

## 2022-06-07 MED ORDER — LACTATED RINGERS IV SOLN
500.0000 mL | Freq: Once | INTRAVENOUS | Status: DC | PRN
Start: 2022-06-07 — End: 2022-06-08

## 2022-06-07 MED ORDER — VANCOMYCIN HCL IN DEXTROSE 1-5 GM/200ML-% IV SOLN
1000.0000 mg | Freq: Once | INTRAVENOUS | Status: AC
  Administered 2022-06-07: 1000 mg via INTRAVENOUS
  Filled 2022-06-07: qty 200

## 2022-06-07 MED ORDER — ONDANSETRON HCL 4 MG/2ML IJ SOLN
4.0000 mg | Freq: Four times a day (QID) | INTRAMUSCULAR | Status: DC | PRN
  Filled 2022-06-07: qty 2

## 2022-06-07 MED ORDER — PHENYLEPHRINE 80 MCG/ML (10ML) SYRINGE FOR IV PUSH (FOR BLOOD PRESSURE SUPPORT)
PREFILLED_SYRINGE | INTRAVENOUS | Status: DC | PRN
  Administered 2022-06-07: 80 ug via INTRAVENOUS
  Administered 2022-06-07: 40 ug via INTRAVENOUS
  Administered 2022-06-07: 20 ug via INTRAVENOUS

## 2022-06-07 MED ORDER — LACTATED RINGERS IV SOLN
INTRAVENOUS | Status: DC
Start: 2022-06-07 — End: 2022-06-11

## 2022-06-07 MED ORDER — MOMETASONE FURO-FORMOTEROL FUM 200-5 MCG/ACT IN AERO
2.0000 | INHALATION_SPRAY | Freq: Two times a day (BID) | RESPIRATORY_TRACT | Status: DC
  Filled 2022-06-07: qty 8.8

## 2022-06-07 MED ORDER — ARTIFICIAL TEARS OPHTHALMIC OINT
TOPICAL_OINTMENT | OPHTHALMIC | Status: DC | PRN
  Administered 2022-06-07: 1 via OPHTHALMIC

## 2022-06-07 MED ORDER — BISACODYL 5 MG PO TBEC
10.0000 mg | DELAYED_RELEASE_TABLET | Freq: Every day | ORAL | Status: DC
  Administered 2022-06-08 – 2022-06-11 (×4): 10 mg via ORAL
  Filled 2022-06-07 (×5): qty 2

## 2022-06-07 MED ORDER — HEMOSTATIC AGENTS (NO CHARGE) OPTIME
TOPICAL | Status: DC | PRN
  Administered 2022-06-07 (×2): 1 via TOPICAL

## 2022-06-07 MED ORDER — SODIUM CHLORIDE 0.9 % IV SOLN: INTRAVENOUS | Status: DC | PRN

## 2022-06-07 MED ORDER — TRAMADOL HCL 50 MG PO TABS
50.0000 mg | ORAL_TABLET | ORAL | Status: DC | PRN
  Administered 2022-06-08: 50 mg via ORAL
  Administered 2022-06-10 – 2022-06-12 (×4): 100 mg via ORAL
  Filled 2022-06-07: qty 1
  Filled 2022-06-07 (×5): qty 2

## 2022-06-07 MED ORDER — LEVALBUTEROL HCL 1.25 MG/0.5ML IN NEBU
1.2500 mg | INHALATION_SOLUTION | Freq: Four times a day (QID) | RESPIRATORY_TRACT | Status: DC
  Administered 2022-06-07 – 2022-06-10 (×10): 1.25 mg via RESPIRATORY_TRACT
  Filled 2022-06-07 (×10): qty 0.5

## 2022-06-07 MED ORDER — INSULIN REGULAR(HUMAN) IN NACL 100-0.9 UT/100ML-% IV SOLN: INTRAVENOUS | Status: DC

## 2022-06-07 MED ORDER — ACETAMINOPHEN 160 MG/5ML PO SOLN: 650.0000 mg | Freq: Once | ORAL | Status: AC

## 2022-06-07 MED ORDER — METOPROLOL TARTRATE 12.5 MG HALF TABLET
12.5000 mg | ORAL_TABLET | Freq: Two times a day (BID) | ORAL | Status: DC
  Administered 2022-06-08 – 2022-06-10 (×4): 12.5 mg via ORAL
  Filled 2022-06-07 (×4): qty 1

## 2022-06-07 MED ORDER — ACETAMINOPHEN 500 MG PO TABS
1000.0000 mg | ORAL_TABLET | Freq: Four times a day (QID) | ORAL | Status: AC
  Administered 2022-06-08 – 2022-06-12 (×19): 1000 mg via ORAL
  Filled 2022-06-07 (×18): qty 2

## 2022-06-07 SURGICAL SUPPLY — 89 items
ADAPTER CARDIO PERF ANTE/RETRO (ADAPTER) ×2 IMPLANT
ADH SRG 12 PREFL SYR 3 SPRDR (MISCELLANEOUS)
ADPR PRFSN 84XANTGRD RTRGD (ADAPTER) ×1
APL SWBSTK 6 STRL LF DISP (MISCELLANEOUS)
APPLICATOR COTTON TIP 6 STRL (MISCELLANEOUS) IMPLANT
APPLICATOR COTTON TIP 6IN STRL (MISCELLANEOUS)
BAG DECANTER FOR FLEXI CONT (MISCELLANEOUS) ×2 IMPLANT
BLADE CLIPPER SURG (BLADE) ×2 IMPLANT
BLADE STERNUM SYSTEM 6 (BLADE) ×2 IMPLANT
BLADE SURG 15 STRL LF DISP TIS (BLADE) ×1 IMPLANT
BLADE SURG 15 STRL SS (BLADE) ×2
CANISTER SUCT 3000ML PPV (MISCELLANEOUS) ×2 IMPLANT
CANNULA GUNDRY RCSP 15FR (MISCELLANEOUS) ×2 IMPLANT
CATH ROBINSON RED A/P 18FR (CATHETERS) ×4 IMPLANT
CAUTERY EYE LOW TEMP 1300F FIN (OPHTHALMIC RELATED) ×1 IMPLANT
CLIP FOGARTY SPRING 6M (CLIP) IMPLANT
CLIP TI MEDIUM 24 (CLIP) IMPLANT
CNTNR URN SCR LID CUP LEK RST (MISCELLANEOUS) IMPLANT
CONT SPEC 4OZ STRL OR WHT (MISCELLANEOUS) ×6
CONTAINER PROTECT SURGISLUSH (MISCELLANEOUS) ×3 IMPLANT
DRAIN CHANNEL 19F RND (DRAIN) ×1 IMPLANT
DRAIN CONNECTOR BLAKE 1:1 (MISCELLANEOUS) ×1 IMPLANT
DRAPE WARM FLUID 44X44 (DRAPES) IMPLANT
DRSG COVADERM 4X14 (GAUZE/BANDAGES/DRESSINGS) ×2 IMPLANT
ELECT REM PT RETURN 9FT ADLT (ELECTROSURGICAL) ×4
ELECTRODE REM PT RTRN 9FT ADLT (ELECTROSURGICAL) ×2 IMPLANT
FELT TEFLON 1X6 (MISCELLANEOUS) ×2 IMPLANT
GAUZE 4X4 16PLY ~~LOC~~+RFID DBL (SPONGE) ×2 IMPLANT
GAUZE SPONGE 4X4 12PLY STRL (GAUZE/BANDAGES/DRESSINGS) ×4 IMPLANT
GAUZE SPONGE 4X4 12PLY STRL LF (GAUZE/BANDAGES/DRESSINGS) ×1 IMPLANT
GLOVE SURG SIGNA 7.5 PF LTX (GLOVE) ×6 IMPLANT
GOWN STRL REUS W/ TWL LRG LVL3 (GOWN DISPOSABLE) ×4 IMPLANT
GOWN STRL REUS W/TWL LRG LVL3 (GOWN DISPOSABLE) ×8
GRAFT WOVEN D/V 30DX30L (Vascular Products) ×1 IMPLANT
HEMOSTAT POWDER SURGIFOAM 1G (HEMOSTASIS) ×6 IMPLANT
HEMOSTAT SURGICEL 2X14 (HEMOSTASIS) ×2 IMPLANT
INSERT FOGARTY XLG (MISCELLANEOUS) ×1 IMPLANT
IV CATH 18G X1.75 CATHLON (IV SOLUTION) ×1 IMPLANT
KIT BASIN OR (CUSTOM PROCEDURE TRAY) ×2 IMPLANT
KIT SUCTION CATH 14FR (SUCTIONS) ×4 IMPLANT
KIT TURNOVER KIT B (KITS) ×2 IMPLANT
LINE VENT (MISCELLANEOUS) ×3 IMPLANT
LOOP VESSEL SUPERMAXI WHITE (MISCELLANEOUS) ×1 IMPLANT
NEEDLE AORTIC AIR ASPIRATING (NEEDLE) ×1 IMPLANT
NS IRRIG 1000ML POUR BTL (IV SOLUTION) ×8 IMPLANT
PACK OPEN HEART (CUSTOM PROCEDURE TRAY) ×2 IMPLANT
PAD ARMBOARD 7.5X6 YLW CONV (MISCELLANEOUS) ×4 IMPLANT
POSITIONER HEAD DONUT 9IN (MISCELLANEOUS) ×2 IMPLANT
SET MPS 3-ND DEL (MISCELLANEOUS) ×1 IMPLANT
SPONGE T-LAP 18X18 ~~LOC~~+RFID (SPONGE) ×8 IMPLANT
SPONGE T-LAP 4X18 ~~LOC~~+RFID (SPONGE) ×2 IMPLANT
SUT EB EXC GRN/WHT 2-0 V-5 (SUTURE) ×4 IMPLANT
SUT ETHIBON EXCEL 2-0 V-5 (SUTURE) IMPLANT
SUT ETHIBOND 2 0 SH (SUTURE) ×4
SUT ETHIBOND 2 0 SH 36X2 (SUTURE) ×1 IMPLANT
SUT ETHIBOND 2 0 V4 (SUTURE) IMPLANT
SUT ETHIBOND 2 0V4 GREEN (SUTURE) IMPLANT
SUT ETHIBOND 4 0 RB 1 (SUTURE) IMPLANT
SUT ETHIBOND V-5 VALVE (SUTURE) IMPLANT
SUT PROLENE 3 0 SH 1 (SUTURE) ×4 IMPLANT
SUT PROLENE 3 0 SH DA (SUTURE) ×2 IMPLANT
SUT PROLENE 4 0 RB 1 (SUTURE) ×26
SUT PROLENE 4 0 SH DA (SUTURE) ×6 IMPLANT
SUT PROLENE 4-0 RB1 .5 CRCL 36 (SUTURE) IMPLANT
SUT PROLENE 5 0 C 1 36 (SUTURE) ×4 IMPLANT
SUT SILK  1 MH (SUTURE) ×4
SUT SILK 1 MH (SUTURE) ×2 IMPLANT
SUT SILK 1 TIES 10X30 (SUTURE) ×2 IMPLANT
SUT SILK 2 0 (SUTURE) ×2
SUT SILK 2 0 SH CR/8 (SUTURE) ×4 IMPLANT
SUT SILK 2-0 18XBRD TIE 12 (SUTURE) ×1 IMPLANT
SUT SILK 3 0 SH CR/8 (SUTURE) ×2 IMPLANT
SUT SILK 4 0 (SUTURE) ×2
SUT SILK 4-0 18XBRD TIE 12 (SUTURE) ×1 IMPLANT
SUT STEEL 6MS V (SUTURE) ×1 IMPLANT
SUT STEEL SZ 6 DBL 3X14 BALL (SUTURE) ×1 IMPLANT
SUT TEM PAC WIRE 2 0 SH (SUTURE) ×8 IMPLANT
SUT VIC AB 1 CTX 36 (SUTURE) ×4
SUT VIC AB 1 CTX36XBRD ANBCTR (SUTURE) ×2 IMPLANT
SUT VIC AB 2-0 CTX 27 (SUTURE) ×4 IMPLANT
SUT VIC AB 3-0 X1 27 (SUTURE) ×4 IMPLANT
SYR 10ML KIT SKIN ADHESIVE (MISCELLANEOUS) IMPLANT
SYSTEM SAHARA CHEST DRAIN ATS (WOUND CARE) ×2 IMPLANT
TAPE PAPER 2X10 WHT MICROPORE (GAUZE/BANDAGES/DRESSINGS) ×1 IMPLANT
TOWEL GREEN STERILE (TOWEL DISPOSABLE) ×2 IMPLANT
TOWEL GREEN STERILE FF (TOWEL DISPOSABLE) ×2 IMPLANT
TRAY FOLEY SLVR 14FR TEMP STAT (SET/KITS/TRAYS/PACK) ×2 IMPLANT
UNDERPAD 30X36 HEAVY ABSORB (UNDERPADS AND DIAPERS) ×2 IMPLANT
WATER STERILE IRR 1000ML POUR (IV SOLUTION) ×4 IMPLANT

## 2022-06-07 NOTE — Hospital Course (Signed)
PCP is Kinnie [name], MD Referring Provider is Lauree Chandler D  History of Present Illness:  HPI: Maria Griffith is sent for consultation regarding aortic insufficiency and aortic root aneurysm.  Maria Griffith is a 61 year old woman with a history of a sinus of Valsalva aneurysm, aortic insufficiency, hypertension, chronic diastolic heart failure, chronic pain with neuropathy, and depression.  She has had malignant poorly controlled hypertension.  An echocardiogram in 2019 showed mild AI and a mildly dilated aortic root.  More recently she developed dyspnea on exertion and swelling in her legs.  Echocardiogram showed moderate to severe AI with an aortic root aneurysm measuring 48 mm.  Cardiac CT showed no significant coronary disease.  She was in the office a couple of weeks ago to talk about surgery to repair her sinus of Valsalva aneurysm and aortic valve.  She had just had a tooth extracted.  She was noted to have a blood pressure of 829 systolic and was sent to the emergency room.  She was treated there and released.   She is feeling relatively well.  She still has some swelling and some shortness of breath with exertion.  Feels tired a lot.  Still has some chest pain but is better with her blood pressure better controlled.  Her gums have healed after dental extractions.  She is scheduled for repair of sinus of Valsalva aneurysm and replacement of ascending aorta and resuspension of aortic valve on Wednesday, 06/07/2022.  We discussed the indications, risks, benefits, and alternatives in detail with her at her previous visit.  She is aware of the risks and agrees to proceed.   She did have some questions, which I answered for her.   Plan repair of sinus of Valsalva and ascending aortic aneurysm and resuspension of aortic valve on 06/07/2022.  Hospital Course: Maria Griffith was admitted for elective surgery on 06/07/22 and taken to the OR where the sinus of valsalva aneurysm was repaired  with a Hemashield path to the non-coronary cusp and a 52m Hemashield straight graft.  Following the procedure, she was taken to the surgical ICU in stable condition.

## 2022-06-07 NOTE — Anesthesia Procedure Notes (Signed)
Arterial Line Insertion Start/End6/21/2023 8:05 AM, 06/07/2022 8:15 AM  Patient location: Pre-op. Preanesthetic checklist: patient identified, IV checked, site marked, risks and benefits discussed, surgical consent, monitors and equipment checked, pre-op evaluation, timeout performed and anesthesia consent Lidocaine 1% used for infiltration Left, radial was placed Catheter size: 20 G Hand hygiene performed  and maximum sterile barriers used   Attempts: 1 Procedure performed without using ultrasound guided technique. Following insertion, dressing applied and Biopatch. Post procedure assessment: normal and unchanged  Patient tolerated the procedure well with no immediate complications.

## 2022-06-07 NOTE — Op Note (Unsigned)
NAME: Maria Griffith, Maria Griffith MEDICAL RECORD NO: [id number] ACCOUNT NO: [id number] DATE OF BIRTH: [birth year] FACILITY: [facility] LOCATION: [facility] PHYSICIAN: Revonda Standard. Roxan Hockey, MD  Operative Report   DATE OF PROCEDURE: 06/07/2022  PREOPERATIVE DIAGNOSIS:  Sinus of Valsalva aneurysm and ascending aneurysm.  POSTOPERATIVE DIAGNOSIS:  Sinus of Valsalva aneurysm and ascending aneurysm.  PROCEDURE:  Median sternotomy, extracorporeal circulation, patch repair of sinus of Valsalva aneurysm involving noncoronary cusp and ascending aortic aneurysm repair with a 30 mm Hemashield graft.  SURGEON:  Revonda Standard. Roxan Hockey, MD  ASSISTANT:  Enid Cutter, PA  ANESTHESIA:  General.  FINDINGS:  Aneurysmal dilatation of the noncoronary sinus of Valsalva, moderate aortic insufficiency, preserved left ventricular systolic function mildly aneurysmal ascending aorta at 4.3 cm.  Post-bypass transesophageal echocardiography showed mild to  moderate aortic insufficiency with preserved left ventricular systolic function.  CLINICAL NOTE:  The patient is a 61 year old woman with a known sinus of Valsalva aneurysm.  She has had difficulty with malignant hypertension and repeated ER visits for chest pain.  She also developed exertional dyspnea and peripheral edema.  She was  advised to undergo repair of the sinus of Valsalva aneurysm.  She also had a 4.3 cm ascending aneurysm, which returned to normal prior to the takeoff of the innominate artery.  She was advised to have that repaired at the same setting.  She had no  evidence of significant coronary artery disease by CT.  The indications, risks, benefits, and alternatives were discussed in detail with the patient.  She accepted the risks and agreed to proceed.  OPERATIVE NOTE:  The patient was brought to the preoperative holding area on 06/07/2022.  The anesthesia service under direction of Dr. Gloris Manchester placed a Swan-Ganz catheter and arterial blood pressure monitoring  line.  She was taken to the operating  room and anesthetized and intubated.  Foley catheter was placed.  Intravenous antibiotics were administered.  The chest, abdomen and legs were prepped and draped in the usual sterile fashion.  Transesophageal echocardiography was performed by Dr.  Gloris Manchester.  Please see a separately dictated note for full details of the procedure.  There was an aneurysm of the noncoronary sinus of Valsalva and moderate-to-severe aortic insufficiency.  A timeout was performed.  A median sternotomy was performed.  Initial hemostasis was obtained.  A sternal retractor was placed and was gradually opened over time.  The patient was fully heparinized.  The pericardium was opened.  The ascending aorta was  relatively large, but tapered back to a normal size prior to the takeoff of the innominate artery.  After confirming adequate anticoagulation, the aorta was cannulated in the distal ascending aorta/proximal arch via concentric 2-0 Ethibond pledgeted  pursestring sutures.  A dual stage venous cannula was placed via a pursestring suture in the right atrial appendage.  Cardiopulmonary bypass was initiated.  Flows were maintained per protocol.  The patient was cooled to 34 degrees Celsius.  A left  ventricular vent was placed via a pursestring suture in the right superior pulmonary vein.  Retrograde cardioplegia cannula was placed via a pursestring suture in the right atrium and directed into the coronary sinus.  An antegrade cardioplegia cannula  was placed in the ascending aorta.  The aorta was crossclamped.  The left ventricle was emptied via the aortic root and left ventricular vents.  Cardiac arrest then was achieved with cold antegrade KBC blood cardioplegia combined with retrograde cardioplegia and topical iced saline.   Initial 750 mL of cardioplegia was given antegrade.  There  was a rapid diastolic arrest.  The remainder of the calculated dose of cardioplegia, which was 1275 mL was  given retrograde.  There was additional septal cooling to less than 15 degrees Celsius.  The aorta was transected just above the sinotubular junction.  Inspection of the root showed the left and right sinuses of Valsalva to be normal.  The noncoronary sinus was enlarged.  The aortic valve leaflets were thin and pliable.  There was no annular  calcification.  The coronary ostia were inspected and there was no evidence of disease.  A 30 mm Hemashield graft was selected for the ascending aortic replacement.  A patch was cut from this graft in size based on the size of the left and right sinuses  of Valsalva.  It was then used to patch over the noncoronary sinus of Valsalva.  It was sewn into place with a running 4-0 Prolene suture.  Inferiorly, it was sewn to the annulus and up along the commissures and superiorly was sewn to the aortic edge.   The 30 mm Hemashield graft then was sewn end-to-end to the aorta at the level of the sinotubular junction with a running 4-0 Prolene suture.  The graft was cut to length and sewn end-to-end to the distal ascending aorta with a running 4-0 Prolene suture.   A McGoon needle was placed into the graft, the patient was placed in Trendelenburg position.  A 400 mL reanimation dose of cardioplegia was administered.  De-airing was performed and the aortic crossclamp was removed.  The total crossclamp time was 69  minutes.  There were a few small bleeding sites that were repaired with 4-0 Prolene pledgeted sutures.  The patient required a single defibrillation with 10 joules and then was in a sinus rhythm thereafter.  While epicardial pacing wires were placed on  the right ventricle and right atrium and while rewarming was completed, all anastomoses were again inspected for hemostasis.  When the patient reached a core temperature of 37 degrees Celsius a trial wean was performed, it showed some residual mitral  regurgitation, but this was felt to be acceptable result.  The patient  was placed back on bypass.  The left ventricular vent was removed.  Additional de-airing was performed.  The retrograde cardioplegia cannula was removed.  The patient then was weaned  from cardiopulmonary bypass without difficulty.  She was in sinus rhythm and on no inotropic support at the time of separation from bypass.  Total bypass time was 109 minutes.  Post-bypass transesophageal echocardiography showed preserved left  ventricular systolic function.  There was mild-to-moderate mitral regurgitation.  Initially, there was mitral regurgitation as well, but that improved during the postoperative period.  A test dose protamine was administered and was well tolerated.  The  atrial and aortic cannulae were removed.  The McGoon needle was removed from the graft.  The remainder of the protamine was administered without incident.  The chest was copiously irrigated with warm saline.  Hemostasis was achieved.  The pericardium was  reapproximated over the heart with interrupted 3-0 silk sutures came together easily without tension.  A 19-French Blake drain was placed in the pericardium along the diaphragmatic surface and a 36-French chest tube was placed in the anterior  mediastinum.  Both were secured with #1 silk sutures.  The sternum was closed with a combination of single and double heavy gauge stainless steel wires.  Pectoralis fascia, subcutaneous tissue and skin were closed in standard fashion.  All sponge, needle  and instrument counts were correct at the end of the procedure.  The patient was taken from the operating room to the surgical intensive care unit, intubated and in good condition.  Experienced assistance was necessary for this case due to surgical complexity.  Enid Cutter assisted with the procedure, providing retraction of delicate tissues for exposure, suctioning and suture management.   PUS D: 06/07/2022 5:06:09 pm T: 06/07/2022 7:53:00 pm  JOB: [id number]/ [id number]

## 2022-06-07 NOTE — Transfer of Care (Signed)
Immediate Anesthesia Transfer of Care Note  Patient: Maria Griffith  Procedure(s) Performed: ASCENDING AORTIC ROOT REPLACEMENT USING 30MM HEMASHIELD PLATINUM WOVEN DOUBLE VELOUR VASCULAR GRAFT TRANSESOPHAGEAL ECHOCARDIOGRAM (TEE)  Patient Location: ICU  Anesthesia Type:General  Level of Consciousness: Patient remains intubated per anesthesia plan  Airway & Oxygen Therapy: Patient remains intubated per anesthesia plan and Patient placed on Ventilator (see vital sign flow sheet for setting)  Post-op Assessment: Report given to RN and Post -op Vital signs reviewed and stable  Post vital signs: Reviewed and stable  Last Vitals:  Vitals Value Taken Time  BP    Temp 35.8 C 06/07/22 1322  Pulse 73 06/07/22 1322  Resp 12 06/07/22 1322  SpO2 100 % 06/07/22 1322  Vitals shown include unvalidated device data.  Last Pain:  Vitals:   06/07/22 0658  TempSrc: Oral         Complications: No notable events documented.

## 2022-06-07 NOTE — Procedures (Signed)
Extubation Procedure Note  Patient Details:   Name: Maria Griffith DOB: [birth year] MRN: [id number]   Airway Documentation:    Vent end date: 06/07/22 Vent end time: 1937   Evaluation  O2 sats: stable throughout Complications: No apparent complications Patient did tolerate procedure well. Bilateral Breath Sounds: Clear  NIF= -40 extubated patient and placed on [place] at 5lpm. No upper airway strider heard  Yes  Ulice Dash 06/07/2022, 7:37 PM

## 2022-06-07 NOTE — Brief Op Note (Addendum)
06/07/2022  11:44 AM  PATIENT:  Maria Griffith  61 y.o. female  PRE-OPERATIVE DIAGNOSIS:  SINUS OF VALSALVA ANEURYSM, ASCENDING ANEURYSM  POST-OPERATIVE DIAGNOSIS:  SINUS OF VALSALVA ANEURYSM, ASCENDING ANEURYSM  PROCEDURE:   PATCH REPAIR OF SINUS OF VALSALVA ANEURYSM (NONCORONARY) ASCENDING AORTIC ROOT REPLACEMENT USING 30MM HEMASHIELD PLATINUM WOVEN DOUBLE VELOUR VASCULAR GRAFT   TRANSESOPHAGEAL ECHOCARDIOGRAM   SURGEON: Melrose Nakayama, MD - Primary  PHYSICIAN ASSISTANT: Enid Cutter, PA  ASSISTANTS: Sammie Bench, RN, RN First Assistant   ANESTHESIA:   general  EBL:  165m   BLOOD ADMINISTERED:none  DRAINS:  Mediastinal Blake    LOCAL MEDICATIONS USED:  NONE  SPECIMEN:  No Specimen  COUNTS:  Correct  DICTATION: .Dragon Dictation  PLAN OF CARE: Admit to inpatient   PATIENT DISPOSITION:  ICU - intubated and hemodynamically stable.   Delay start of Pharmacological VTE agent (>24hrs) due to surgical blood loss or risk of bleeding: yes

## 2022-06-07 NOTE — Progress Notes (Signed)
CT surgery PM rounds  Patient remains sedated on ventilator after grafting of ascending thoracic aneurysm and repair of sinus of Valsalva aneurysm. Paced rhythm with stable blood pressure, cardiac index now up to 2.1 after volume and treatment of hypertension with Cardene.  Minimal chest tube output Vent wean when patient is more responsive and cooperative. Blood pressure (!) 106/56, pulse 90, temperature 97.7 F (36.5 C), resp. rate 16, height '5\' 1"'$  (1.549 m), weight 84.8 kg, last menstrual period 10/22/2014, SpO2 98 %.

## 2022-06-07 NOTE — Interval H&P Note (Signed)
History and Physical Interval Note:  06/07/2022 7:40 AM  Maria Griffith  has presented today for surgery, with the diagnosis of SINUS OF VALSALVA ANEURYSM.  The various methods of treatment have been discussed with the patient and family. After consideration of risks, benefits and other options for treatment, the patient has consented to  Procedure(s): ASCENDING AORTIC ROOT REPLACEMENT (N/A) TRANSESOPHAGEAL ECHOCARDIOGRAM (TEE) (N/A) as a surgical intervention.  The patient's history has been reviewed, patient examined, no change in status, stable for surgery.  I have reviewed the patient's chart and labs.  Questions were answered to the patient's satisfaction.     Melrose Nakayama

## 2022-06-07 NOTE — Anesthesia Procedure Notes (Signed)
Central Venous Catheter Insertion Start/End6/21/2023 7:52 AM, 06/07/2022 8:05 AM Patient location: Pre-op. Preanesthetic checklist: patient identified, IV checked, site marked, risks and benefits discussed, surgical consent, monitors and equipment checked, pre-op evaluation, timeout performed and anesthesia consent Position: Trendelenburg Lidocaine 1% used for infiltration and patient sedated Hand hygiene performed , maximum sterile barriers used  and Seldinger technique used Catheter size: 9 Fr Central line was placed.MAC introducer Swan type:thermodilution Procedure performed using ultrasound guided technique. Ultrasound Notes:anatomy identified, needle tip was noted to be adjacent to the nerve/plexus identified, no ultrasound evidence of intravascular and/or intraneural injection and image(s) printed for medical record Attempts: 1 Following insertion, line sutured, dressing applied and Biopatch. Post procedure assessment: blood return through all ports, free fluid flow and no air  Post procedure complications: local hematoma. Patient tolerated the procedure well with no immediate complications.

## 2022-06-07 NOTE — Anesthesia Procedure Notes (Signed)
Central Venous Catheter Insertion Start/End6/21/2023 8:06 AM, 06/07/2022 8:07 AM Patient location: Pre-op. Preanesthetic checklist: patient identified, IV checked, site marked, risks and benefits discussed, surgical consent, monitors and equipment checked, pre-op evaluation, timeout performed and anesthesia consent Position: supine Hand hygiene performed  and maximum sterile barriers used  PA cath was placed.Swan type:thermodilution PA Cath depth:47 Procedure performed without using ultrasound guided technique. Attempts: 1 Patient tolerated the procedure well with no immediate complications.

## 2022-06-07 NOTE — Progress Notes (Signed)
  Transition of Care [facility]) Screening Note   Patient Details  Name: Maria Griffith Date of Birth: 1961-07-21   [facility] of [facility]) CM/SW Contact:    Milas Gain, [place] Phone Number: 06/07/2022, 3:15 PM    Transition of Care Department [facility]) has reviewed patient and no TOC needs have been identified at this time. We will continue to monitor patient advancement through interdisciplinary progression rounds. If new patient transition needs arise, please place a TOC consult.

## 2022-06-07 NOTE — Progress Notes (Signed)
Paged Roxan Hockey MD in regards to patient's continued low CO/CI. Orders received to give another albumin with calcium.

## 2022-06-08 ENCOUNTER — Other Ambulatory Visit: Payer: Self-pay

## 2022-06-08 ENCOUNTER — Inpatient Hospital Stay (HOSPITAL_COMMUNITY): Payer: Medicare Other

## 2022-06-08 ENCOUNTER — Encounter (HOSPITAL_COMMUNITY): Payer: Self-pay | Admitting: Thoracic Surgery (Cardiothoracic Vascular Surgery)

## 2022-06-08 LAB — GLUCOSE, CAPILLARY
Glucose-Capillary: 107 mg/dL — ABNORMAL HIGH (ref 70–99)
Glucose-Capillary: 108 mg/dL — ABNORMAL HIGH (ref 70–99)
Glucose-Capillary: 110 mg/dL — ABNORMAL HIGH (ref 70–99)
Glucose-Capillary: 111 mg/dL — ABNORMAL HIGH (ref 70–99)
Glucose-Capillary: 111 mg/dL — ABNORMAL HIGH (ref 70–99)
Glucose-Capillary: 112 mg/dL — ABNORMAL HIGH (ref 70–99)
Glucose-Capillary: 113 mg/dL — ABNORMAL HIGH (ref 70–99)
Glucose-Capillary: 114 mg/dL — ABNORMAL HIGH (ref 70–99)
Glucose-Capillary: 114 mg/dL — ABNORMAL HIGH (ref 70–99)
Glucose-Capillary: 123 mg/dL — ABNORMAL HIGH (ref 70–99)
Glucose-Capillary: 126 mg/dL — ABNORMAL HIGH (ref 70–99)
Glucose-Capillary: 129 mg/dL — ABNORMAL HIGH (ref 70–99)
Glucose-Capillary: 131 mg/dL — ABNORMAL HIGH (ref 70–99)
Glucose-Capillary: 159 mg/dL — ABNORMAL HIGH (ref 70–99)
Glucose-Capillary: 62 mg/dL — ABNORMAL LOW (ref 70–99)
Glucose-Capillary: 96 mg/dL (ref 70–99)

## 2022-06-08 LAB — BASIC METABOLIC PANEL
Anion gap: 3 — ABNORMAL LOW (ref 5–15)
Anion gap: 6 (ref 5–15)
BUN: 16 mg/dL (ref 6–20)
BUN: 17 mg/dL (ref 6–20)
CO2: 23 mmol/L (ref 22–32)
CO2: 26 mmol/L (ref 22–32)
Calcium: 7.5 mg/dL — ABNORMAL LOW (ref 8.9–10.3)
Calcium: 8 mg/dL — ABNORMAL LOW (ref 8.9–10.3)
Chloride: 109 mmol/L (ref 98–111)
Chloride: 107 mmol/L (ref 98–111)
Creatinine, Ser: 1.19 mg/dL — ABNORMAL HIGH (ref 0.44–1.00)
Creatinine, Ser: 1.41 mg/dL — ABNORMAL HIGH (ref 0.44–1.00)
GFR, Estimated: 53 mL/min — ABNORMAL LOW (ref >60)
GFR, Estimated: 43 mL/min — ABNORMAL LOW (ref >60)
Glucose, Bld: 122 mg/dL — ABNORMAL HIGH (ref 70–99)
Glucose, Bld: 102 mg/dL — ABNORMAL HIGH (ref 70–99)
Potassium: 4.1 mmol/L (ref 3.5–5.1)
Potassium: 3.9 mmol/L (ref 3.5–5.1)
Sodium: 135 mmol/L (ref 135–145)
Sodium: 139 mmol/L (ref 135–145)

## 2022-06-08 LAB — CBC
HCT: 27.6 % — ABNORMAL LOW (ref 36.0–46.0)
HCT: 30 % — ABNORMAL LOW (ref 36.0–46.0)
Hemoglobin: 9.1 g/dL — ABNORMAL LOW (ref 12.0–15.0)
Hemoglobin: 9.7 g/dL — ABNORMAL LOW (ref 12.0–15.0)
MCH: 29.2 pg (ref 26.0–34.0)
MCH: 29 pg (ref 26.0–34.0)
MCHC: 33 g/dL (ref 30.0–36.0)
MCHC: 32.3 g/dL (ref 30.0–36.0)
MCV: 88.5 fL (ref 80.0–100.0)
MCV: 89.6 fL (ref 80.0–100.0)
Platelets: 145 10*3/uL — ABNORMAL LOW (ref 150–400)
Platelets: 147 10*3/uL — ABNORMAL LOW (ref 150–400)
RBC: 3.12 MIL/uL — ABNORMAL LOW (ref 3.87–5.11)
RBC: 3.35 MIL/uL — ABNORMAL LOW (ref 3.87–5.11)
RDW: 13.7 % (ref 11.5–15.5)
RDW: 14 % (ref 11.5–15.5)
WBC: 5.7 10*3/uL (ref 4.0–10.5)
WBC: 6.1 10*3/uL (ref 4.0–10.5)
nRBC: 0 % (ref 0.0–0.2)
nRBC: 0 % (ref 0.0–0.2)

## 2022-06-08 LAB — MAGNESIUM
Magnesium: 2.7 mg/dL — ABNORMAL HIGH (ref 1.7–2.4)
Magnesium: 2.4 mg/dL (ref 1.7–2.4)

## 2022-06-08 LAB — SURGICAL PATHOLOGY

## 2022-06-08 MED ORDER — PHENYLEPHRINE HCL-NACL 20-0.9 MG/250ML-% IV SOLN
INTRAVENOUS | Status: AC
  Filled 2022-06-08: qty 250

## 2022-06-08 MED ORDER — INSULIN DETEMIR 100 UNIT/ML ~~LOC~~ SOLN
10.0000 [IU] | Freq: Two times a day (BID) | SUBCUTANEOUS | Status: DC
  Administered 2022-06-08 – 2022-06-09 (×4): 10 [IU] via SUBCUTANEOUS
  Filled 2022-06-08 (×6): qty 0.1

## 2022-06-08 MED ORDER — ENOXAPARIN SODIUM 40 MG/0.4ML IJ SOSY
40.0000 mg | PREFILLED_SYRINGE | Freq: Every day | INTRAMUSCULAR | Status: DC
Start: 2022-06-08 — End: 2022-06-14
  Administered 2022-06-08 – 2022-06-13 (×6): 40 mg via SUBCUTANEOUS
  Filled 2022-06-08 (×6): qty 0.4

## 2022-06-08 MED ORDER — HYDRALAZINE HCL 25 MG PO TABS
25.0000 mg | ORAL_TABLET | Freq: Three times a day (TID) | ORAL | Status: DC
  Administered 2022-06-08 – 2022-06-13 (×13): 25 mg via ORAL
  Filled 2022-06-08 (×13): qty 1

## 2022-06-08 MED ORDER — FUROSEMIDE 10 MG/ML IJ SOLN
40.0000 mg | Freq: Once | INTRAMUSCULAR | Status: AC
  Administered 2022-06-08: 40 mg via INTRAVENOUS
  Filled 2022-06-08: qty 4

## 2022-06-08 MED ORDER — TIZANIDINE HCL 4 MG PO TABS
4.0000 mg | ORAL_TABLET | Freq: Three times a day (TID) | ORAL | Status: DC | PRN
Start: 2022-06-08 — End: 2022-06-14
  Administered 2022-06-08 – 2022-06-14 (×12): 4 mg via ORAL
  Filled 2022-06-08 (×13): qty 1

## 2022-06-08 MED ORDER — OXYBUTYNIN CHLORIDE 5 MG PO TABS
5.0000 mg | ORAL_TABLET | Freq: Two times a day (BID) | ORAL | Status: DC
  Administered 2022-06-08 – 2022-06-14 (×13): 5 mg via ORAL
  Filled 2022-06-08 (×15): qty 1

## 2022-06-08 MED ORDER — TRAZODONE HCL 150 MG PO TABS
150.0000 mg | ORAL_TABLET | Freq: Every day | ORAL | Status: DC
  Filled 2022-06-08: qty 3
  Filled 2022-06-08 (×2): qty 1

## 2022-06-08 MED ORDER — SODIUM CHLORIDE 0.9% FLUSH
10.0000 mL | Freq: Two times a day (BID) | INTRAVENOUS | Status: DC
  Administered 2022-06-08: 20 mL
  Administered 2022-06-09 – 2022-06-10 (×4): 10 mL

## 2022-06-08 MED ORDER — SODIUM CHLORIDE 0.9% FLUSH: 10.0000 mL | INTRAVENOUS | Status: DC | PRN

## 2022-06-08 MED ORDER — WHITE PETROLATUM EX OINT
TOPICAL_OINTMENT | CUTANEOUS | Status: DC | PRN
  Filled 2022-06-08: qty 28.35

## 2022-06-08 MED ORDER — KETOROLAC TROMETHAMINE 15 MG/ML IJ SOLN
15.0000 mg | Freq: Once | INTRAMUSCULAR | Status: AC
  Administered 2022-06-08: 15 mg via INTRAVENOUS
  Filled 2022-06-08: qty 1

## 2022-06-08 MED ORDER — INSULIN ASPART 100 UNIT/ML IJ SOLN: 0.0000 [IU] | INTRAMUSCULAR | Status: DC

## 2022-06-08 MED ORDER — SENNOSIDES-DOCUSATE SODIUM 8.6-50 MG PO TABS: 1.0000 | ORAL_TABLET | Freq: Every evening | ORAL | Status: DC | PRN

## 2022-06-08 NOTE — Progress Notes (Addendum)
TCTS DAILY ICU PROGRESS NOTE                   [address]            [place] [address]          [phone]   1 Day Post-Op Procedure(s) (LRB): ASCENDING AORTIC ROOT REPLACEMENT USING HEMASHIELD PLATINUM WOVEN DOUBLE VELOUR VASCULAR GRAFT (N/A) TRANSESOPHAGEAL ECHOCARDIOGRAM (TEE) (N/A)  Total Length of Stay:  LOS: 1 day   Subjective: Extubated at around 7:30 last evening.  Awake and alert but wants to lie still due to chest soreness.   Stable VS and hemodynamics since surgery.   Objective: Vital signs in last 24 hours: Temp:  [95.9 F (35.5 C)-100.4 F (38 C)] 100.2 F (37.9 C) (06/22 0700) Pulse Rate:  [62-94] 84 (06/22 0730) Cardiac Rhythm: Normal sinus rhythm (06/22 0400) Resp:  [11-35] 16 (06/22 0730) BP: (105-177)/(56-109) 116/81 (06/22 0700) SpO2:  [90 %-100 %] 94 % (06/22 0730) Arterial Line BP: (92-157)/(37-97) 125/63 (06/22 0700) FiO2 (%):  [40 %-50 %] 40 % (06/21 1838) Weight:  [85.5 kg] 85.5 kg (06/22 0529)  Filed Weights   06/07/22 0658 06/08/22 0529  Weight: 84.8 kg 85.5 kg    Weight change: 0.677 kg   Hemodynamic parameters for last 24 hours: PAP: (13-291)/(1-287) 38/20 CO:  [2.2 L/min-6.1 L/min] 6.1 L/min CI:  [1.2 L/min/m2-3.3 L/min/m2] 3.3 L/min/m2  Intake/Output from previous day: 06/21 0701 - 06/22 0700 In: 5940.8 [I.V.:3613.3; Blood:835; IV Piggyback:1492.6] Out: 4103 [Urine:2015; Blood:1728; Chest Tube:360]  Intake/Output this shift: No intake/output data recorded.  Current Meds: Scheduled Meds:  acetaminophen  1,000 mg Oral Q6H   Or   acetaminophen (TYLENOL) oral liquid 160 mg/5 mL  1,000 mg Per Tube Q6H   aspirin EC  325 mg Oral Daily   Or   aspirin  324 mg Per Tube Daily   bisacodyl  10 mg Oral Daily   Or   bisacodyl  10 mg Rectal Daily   Chlorhexidine Gluconate Cloth  6 each Topical Q0600   docusate sodium  200 mg Oral Daily   levalbuterol  1.25 mg Nebulization Q6H   metoprolol tartrate  12.5 mg  Oral BID   Or   metoprolol tartrate  12.5 mg Per Tube BID   mometasone-formoterol  2 puff Inhalation BID   [START ON 06/09/2022] pantoprazole  40 mg Oral Daily   pregabalin  100 mg Oral TID   sodium chloride flush  3 mL Intravenous Q12H   Continuous Infusions:  sodium chloride 20 mL/hr at 06/08/22 0700   sodium chloride     sodium chloride      ceFAZolin (ANCEF) IV Stopped (06/08/22 0535)   dexmedetomidine (PRECEDEX) IV infusion Stopped (06/08/22 0531)   famotidine (PEPCID) IV Stopped (06/07/22 1411)   insulin 1.2 Units/hr (06/08/22 0700)   lactated ringers     lactated ringers     lactated ringers 20 mL/hr at 06/08/22 0700   niCARDipine 2.5 mg/hr (06/08/22 0700)   nitroGLYCERIN 50 mcg/min (06/08/22 0700)   phenylephrine (NEO-SYNEPHRINE) Adult infusion 0 mcg/min (06/07/22 1310)   PRN Meds:.sodium chloride, dextrose, lactated ringers, metoprolol tartrate, midazolam, morphine injection, ondansetron (ZOFRAN) IV, oxyCODONE, polyvinyl alcohol, sodium chloride flush, traMADol  General appearance: alert, cooperative, and moderate distress Neurologic: intact Heart: SR, no murmur Lungs: breath sounds clear but shallow. Abdomen: firm but not tender, absent bowel sounds Extremities: warm and well perfused Wound: dry dressing covers the sternal incision.   Lab  Results: CBC: Recent Labs    06/07/22 1900 06/07/22 2059 06/07/22 2334 06/08/22 0415  WBC 7.6  --   --  5.7  HGB 9.5*  9.9*   < > 9.2* 9.1*  HCT 28.0*  29.9*   < > 27.0* 27.6*  PLT 151  --   --  145*   < > = values in this interval not displayed.   BMET:  Recent Labs    06/07/22 1900 06/07/22 2059 06/07/22 2334 06/08/22 0415  NA 139  138   < > 142 135  K 4.6  4.7   < > 4.1 4.1  CL 109  --   --  109  CO2 21*  --   --  23  GLUCOSE 153*  --   --  122*  BUN 19  --   --  16  CREATININE 1.21*  --   --  1.19*  CALCIUM 8.1*  --   --  7.5*   < > = values in this interval not displayed.    CMET: Lab Results   Component Value Date   WBC 5.7 06/08/2022   HGB 9.1 (L) 06/08/2022   HCT 27.6 (L) 06/08/2022   PLT 145 (L) 06/08/2022   GLUCOSE 122 (H) 06/08/2022   CHOL 145 05/14/2018   TRIG 106 05/14/2018   HDL 41 05/14/2018   LDLCALC 83 05/14/2018   ALT 17 06/05/2022   AST 23 06/05/2022   NA 135 06/08/2022   K 4.1 06/08/2022   CL 109 06/08/2022   CREATININE 1.19 (H) 06/08/2022   BUN 16 06/08/2022   CO2 23 06/08/2022   TSH 0.944 05/14/2018   INR 1.6 (H) 06/07/2022   HGBA1C 6.2 (H) 06/05/2022      PT/INR:  Recent Labs    06/07/22 1319  LABPROT 18.9*  INR 1.6*   Radiology: [facility] Chest Port 1 View  Result Date: 06/07/2022 CLINICAL DATA:  Status post aortic aneurysm repair. EXAM: PORTABLE CHEST 1 VIEW COMPARISON:  June 05, 2022. FINDINGS: Endotracheal and nasogastric tubes are in grossly good position. Right internal jugular Swan-Ganz catheter is noted with tip in expected position of main pulmonary artery. Sternotomy wires are noted. No pneumothorax or significant effusion is noted. Bony thorax is unremarkable. Mediastinal drain is noted. IMPRESSION: Endotracheal and nasogastric tubes are in grossly good position. Right internal jugular Swan-Ganz catheter is noted as well. No pneumothorax is noted. Electronically Signed   By: Lupita Raider M.D.   On: 06/07/2022 13:44     Assessment/Plan: S/P Procedure(s) (LRB): ASCENDING AORTIC ROOT REPLACEMENT USING HEMASHIELD PLATINUM WOVEN DOUBLE VELOUR VASCULAR GRAFT (N/A) TRANSESOPHAGEAL ECHOCARDIOGRAM (TEE) (N/A)  -POD1 repair of aortic root aneurysm. Stable VS, cardiac rhythm, and hemodynamics. On no vasoactive drips. D/C the PA catheter, chest tube, and arterial line. Toradol for pain this morning. Resume her Zanaflex.  Mobilize.    -NEURO-intact  -ENDO- on h/o DM but admission A1C 6.2. Transition to SSI and continue to monitor.  -HEME-Expected acute blood loss anemia, CT drainage minimal. Monitor Hct.   -GI- mild abd distension but not  tender, advance diet slowly  -RENAL- Creat 1.2  (baseline 0.9).  Monitor.  Wt less than 1kg + from pre-op. Lasix x 1 today.   -DVT PPX- to start daily enoxaparin today.     Leary Roca, PA-C [phone] 06/08/2022 7:59 AM  Patient seen and examined, agree with above Pain control is primary issue.  Will give toradol x 1 but don't want to  use on a standing basis as creatinine is mildly elevated Dc tubes, Swan and A line BP control Mobilize  Zaynab Chipman C. Dorris Fetch, MD [facility] [phone]  Dc chest tubes

## 2022-06-09 ENCOUNTER — Inpatient Hospital Stay (HOSPITAL_COMMUNITY): Payer: Medicare Other

## 2022-06-09 LAB — ECHO INTRAOPERATIVE TEE
AV Peak grad: 6.2 mmHg
AV Vena cont: 0.43 cm
Ao pk vel: 1.24 m/s
Area-P 1/2: 4.41 cm2
Height: 61 in
S' Lateral: 3.8 cm
Weight: 2992 oz

## 2022-06-09 LAB — BASIC METABOLIC PANEL
Anion gap: 8 (ref 5–15)
BUN: 23 mg/dL — ABNORMAL HIGH (ref 6–20)
CO2: 25 mmol/L (ref 22–32)
Calcium: 7.9 mg/dL — ABNORMAL LOW (ref 8.9–10.3)
Chloride: 104 mmol/L (ref 98–111)
Creatinine, Ser: 1.53 mg/dL — ABNORMAL HIGH (ref 0.44–1.00)
GFR, Estimated: 39 mL/min — ABNORMAL LOW (ref >60)
Glucose, Bld: 101 mg/dL — ABNORMAL HIGH (ref 70–99)
Potassium: 4.1 mmol/L (ref 3.5–5.1)
Sodium: 137 mmol/L (ref 135–145)

## 2022-06-09 LAB — CBC
HCT: 28.2 % — ABNORMAL LOW (ref 36.0–46.0)
Hemoglobin: 9.1 g/dL — ABNORMAL LOW (ref 12.0–15.0)
MCH: 29 pg (ref 26.0–34.0)
MCHC: 32.3 g/dL (ref 30.0–36.0)
MCV: 89.8 fL (ref 80.0–100.0)
Platelets: 124 10*3/uL — ABNORMAL LOW (ref 150–400)
RBC: 3.14 MIL/uL — ABNORMAL LOW (ref 3.87–5.11)
RDW: 14.1 % (ref 11.5–15.5)
WBC: 7 10*3/uL (ref 4.0–10.5)
nRBC: 0 % (ref 0.0–0.2)

## 2022-06-09 LAB — GLUCOSE, CAPILLARY
Glucose-Capillary: 100 mg/dL — ABNORMAL HIGH (ref 70–99)
Glucose-Capillary: 111 mg/dL — ABNORMAL HIGH (ref 70–99)
Glucose-Capillary: 117 mg/dL — ABNORMAL HIGH (ref 70–99)
Glucose-Capillary: 97 mg/dL (ref 70–99)
Glucose-Capillary: 102 mg/dL — ABNORMAL HIGH (ref 70–99)
Glucose-Capillary: 91 mg/dL (ref 70–99)

## 2022-06-09 MED ORDER — FUROSEMIDE 40 MG PO TABS
40.0000 mg | ORAL_TABLET | Freq: Two times a day (BID) | ORAL | Status: DC
  Administered 2022-06-09 – 2022-06-10 (×3): 40 mg via ORAL
  Filled 2022-06-09 (×3): qty 1

## 2022-06-09 MED ORDER — POTASSIUM CHLORIDE CRYS ER 20 MEQ PO TBCR
20.0000 meq | EXTENDED_RELEASE_TABLET | Freq: Two times a day (BID) | ORAL | Status: DC
  Administered 2022-06-09 – 2022-06-14 (×11): 20 meq via ORAL
  Filled 2022-06-09 (×12): qty 1

## 2022-06-09 MED FILL — Magnesium Sulfate Inj 50%: INTRAMUSCULAR | Qty: 10 | Status: AC

## 2022-06-09 MED FILL — Potassium Chloride Inj 2 mEq/ML: INTRAVENOUS | Qty: 40 | Status: AC

## 2022-06-09 MED FILL — Heparin Sodium (Porcine) Inj 1000 Unit/ML: Qty: 1000 | Status: AC

## 2022-06-09 NOTE — Progress Notes (Addendum)
TCTS DAILY ICU PROGRESS NOTE                   [address]            [place] [address]          [phone]   2 Days Post-Op Procedure(s) (LRB): ASCENDING AORTIC ROOT REPLACEMENT USING HEMASHIELD PLATINUM WOVEN DOUBLE VELOUR VASCULAR GRAFT (N/A) TRANSESOPHAGEAL ECHOCARDIOGRAM (TEE) (N/A)  Total Length of Stay:  LOS: 2 days   Subjective: Up in the bedside chair, feels much better today. She has walked in the hall.  No new concerns past 24 hours.   Objective: Vital signs in last 24 hours: Temp:  [98 F (36.7 C)-101.5 F (38.6 C)] 101.5 F (38.6 C) (06/23 0430) Pulse Rate:  [76-99] 82 (06/23 0615) Cardiac Rhythm: Normal sinus rhythm (06/23 0400) Resp:  [11-27] 12 (06/23 0615) BP: (95-158)/(64-100) 115/64 (06/23 0614) SpO2:  [91 %-100 %] 95 % (06/23 0615) Arterial Line BP: (130-152)/(64-73) 152/73 (06/22 1100) Weight:  [87.7 kg] 87.7 kg (06/23 0516)  Filed Weights   06/07/22 0658 06/08/22 0529 06/09/22 0516  Weight: 84.8 kg 85.5 kg 87.7 kg    Weight change: 2.2 kg   Hemodynamic parameters for last 24 hours: PAP: (37-45)/(22-28) 37/22 CO:  [6.1 L/min] 6.1 L/min CI:  [3.3 L/min/m2] 3.3 L/min/m2  Intake/Output from previous day: 06/22 0701 - 06/23 0700 In: 589.5 [I.V.:489.5; IV Piggyback:100] Out: 1820 [Urine:1820]  Intake/Output this shift: No intake/output data recorded.  Current Meds: Scheduled Meds:  acetaminophen  1,000 mg Oral Q6H   Or   acetaminophen (TYLENOL) oral liquid 160 mg/5 mL  1,000 mg Per Tube Q6H   aspirin EC  325 mg Oral Daily   Or   aspirin  324 mg Per Tube Daily   bisacodyl  10 mg Oral Daily   Or   bisacodyl  10 mg Rectal Daily   Chlorhexidine Gluconate Cloth  6 each Topical Q0600   docusate sodium  200 mg Oral Daily   enoxaparin (LOVENOX) injection  40 mg Subcutaneous QHS   hydrALAZINE  25 mg Oral Q8H   insulin aspart  0-24 Units Subcutaneous Q4H   insulin detemir  10 Units Subcutaneous BID   levalbuterol   1.25 mg Nebulization Q6H   metoprolol tartrate  12.5 mg Oral BID   Or   metoprolol tartrate  12.5 mg Per Tube BID   mometasone-formoterol  2 puff Inhalation BID   oxybutynin  5 mg Oral BID   pantoprazole  40 mg Oral Daily   pregabalin  100 mg Oral TID   sodium chloride flush  10-40 mL Intracatheter Q12H   sodium chloride flush  3 mL Intravenous Q12H   traZODone  150 mg Oral QHS   Continuous Infusions:  sodium chloride Stopped (06/08/22 0859)   sodium chloride Stopped (06/08/22 1500)   sodium chloride     lactated ringers     lactated ringers 20 mL/hr at 06/08/22 1700   niCARDipine Stopped (06/08/22 1041)   nitroGLYCERIN 50 mcg/min (06/08/22 1700)   phenylephrine (NEO-SYNEPHRINE) Adult infusion 0 mcg/min (06/07/22 1310)   PRN Meds:.sodium chloride, metoprolol tartrate, morphine injection, ondansetron (ZOFRAN) IV, oxyCODONE, polyvinyl alcohol, senna-docusate, sodium chloride flush, sodium chloride flush, tiZANidine, traMADol, white petrolatum  General appearance: alert, cooperative, and no distress Neurologic: intact Heart: SR, no murmur, no significant arrhythmias Lungs: breath sounds clear but shallow. Abdomen: soft,not tender  Extremities: warm and well perfused Wound: dry dressing covers the sternal incision.  Lab Results: CBC: Recent Labs    06/08/22 1651 06/09/22 0421  WBC 6.1 7.0  HGB 9.7* 9.1*  HCT 30.0* 28.2*  PLT 147* 124*    BMET:  Recent Labs    06/08/22 1651 06/09/22 0421  NA 139 137  K 3.9 4.1  CL 107 104  CO2 26 25  GLUCOSE 102* 101*  BUN 17 23*  CREATININE 1.41* 1.53*  CALCIUM 8.0* 7.9*     CMET: Lab Results  Component Value Date   WBC 7.0 06/09/2022   HGB 9.1 (L) 06/09/2022   HCT 28.2 (L) 06/09/2022   PLT 124 (L) 06/09/2022   GLUCOSE 101 (H) 06/09/2022   CHOL 145 05/14/2018   TRIG 106 05/14/2018   HDL 41 05/14/2018   LDLCALC 83 05/14/2018   ALT 17 06/05/2022   AST 23 06/05/2022   NA 137 06/09/2022   K 4.1 06/09/2022   CL 104  06/09/2022   CREATININE 1.53 (H) 06/09/2022   BUN 23 (H) 06/09/2022   CO2 25 06/09/2022   TSH 0.944 05/14/2018   INR 1.6 (H) 06/07/2022   HGBA1C 6.2 (H) 06/05/2022      PT/INR:  Recent Labs    06/07/22 1319  LABPROT 18.9*  INR 1.6*    Radiology: No results found.   Assessment/Plan: S/P Procedure(s) (LRB): ASCENDING AORTIC ROOT REPLACEMENT USING HEMASHIELD PLATINUM WOVEN DOUBLE VELOUR VASCULAR GRAFT (N/A) TRANSESOPHAGEAL ECHOCARDIOGRAM (TEE) (N/A)  -POD2 repair of aortic root aneurysm. Stable VS and cardiac rhythm. More comfortable today and progressing slowly with mobility. Advance activity as tolerated.   -NEURO-intact  -ENDO- no h/o DM but admission A1C 6.2. Continue to monitor.  -HEME-Expected acute blood loss anemia, stable. Continue to monitor Hct.   -GI- mild abd distension but not tender, tolerating PO's in small amounts.  -RENAL- slowl increase in creat since surgery. Now 1.5  (baseline 0.9).  Monitor. Wt ~3kg+, no edema on exam.   -DVT PPX- to start daily enoxaparin today.   -Disposition- progressing well post-op day2. Possible transfer to 4E.   Leary Roca, PA-C [phone] 06/09/2022 7:31 AM  Patient seen and examined and discussed with Mr. Hedwig Morton this AM Overall doing well but would benefit from another day in ICU for aggressive rehab, mobilization, pulmonary hygiene Creatinine mildly elevated, monitor  Viviann Spare C. Dorris Fetch, MD [facility] [phone]

## 2022-06-09 NOTE — Discharge Summary (Signed)
Physician Discharge Summary  Patient ID: Maria Griffith MRN: [id number] DOB/AGE: 25-Jun-1961 61 y.o.  Admit date: 06/07/2022 Discharge date: 06/14/2022  Admission Diagnoses: Patient Active Problem List   Diagnosis Date Noted   S/P aortic aneurysm repair 06/07/2022   Vitamin Griffith deficiency 05/09/2022   Restrictive lung disease 05/08/2022   Heart failure with preserved ejection fraction ([place]) 09/26/2021   Morbid obesity ([place]) 09/26/2021   HSV (herpes simplex virus) anogenital infection 12/27/2020   Atrophic vaginitis 67/20/9470   Umbilical hernia 96/28/3662   Seasonal allergic rhinitis 04/15/2020   Lateral femoral cutaneous entrapment syndrome, right 04/05/2020   Cervical myelopathy ([place]) 04/05/2020   Neutropenia ([place]) 06/04/2018   Aortic dilatation ([place]) 05/14/2018   Mixed incontinence 08/25/2015   Pap smear abnormality of vagina with ASC-H 07/15/2015   Fibroid uterus 12/04/2014   Liver lesion 11/28/2013   Hemorrhoid 10/07/2013   Cervical spondylosis with radiculopathy 03/14/2013   Overweight 02/17/2013   MDD (major depressive disorder), recurrent episode, moderate ([place]) 01/20/2013   Post traumatic stress disorder (PTSD) 01/20/2013   Nonrheumatic aortic valve insufficiency 11/15/2012   Asthma, moderate persistent 11/04/2012   Essential hypertension 07/03/2012     Discharge Diagnoses:   Sinus of Valsalva aneurysm and ascending aortic aneurysm S/P aortic aneurysm repair Expected acute blood loss anemia Obesity Hypertension Acute kidney injury postop Deconditioned state following surgery   Discharged Condition: good  CP is Maria [name], MD Referring Provider is Maria Griffith  History of Present Illness:  HPI: Maria Griffith is sent for consultation regarding aortic insufficiency and aortic root aneurysm.  Maria Griffith is a 61 year old woman with a history of a sinus of Valsalva aneurysm, aortic insufficiency, hypertension, chronic diastolic heart  failure, chronic pain with neuropathy, and depression.  She has had malignant poorly controlled hypertension.  An echocardiogram in 2019 showed mild AI and a mildly dilated aortic root.  More recently she developed dyspnea on exertion and swelling in her legs.  Echocardiogram showed moderate to severe AI with an aortic root aneurysm measuring 48 mm.  Cardiac CT showed no significant coronary disease.  She was in the office a couple of weeks ago to talk about surgery to repair her sinus of Valsalva aneurysm and aortic valve.  She had just had a tooth extracted.  She was noted to have a blood pressure of 947 systolic and was sent to the emergency room.  She was treated there and released.   She is feeling relatively well.  She still has some swelling and some shortness of breath with exertion.  Feels tired a lot.  Still has some chest pain but is better with her blood pressure better controlled.  Her gums have healed after dental extractions.  She is scheduled for repair of sinus of Valsalva aneurysm and replacement of ascending aorta and resuspension of aortic valve on Wednesday, 06/07/2022.  We discussed the indications, risks, benefits, and alternatives in detail with her at her previous visit.  She is aware of the risks and agrees to proceed.   She did have some questions, which I answered for her.   Plan repair of sinus of Valsalva and ascending aortic aneurysm and resuspension of aortic valve on 06/07/2022.  Hospital Course: Maria Griffith was admitted for elective surgery on 06/07/22 and taken to the OR where the sinus of valsalva aneurysm was repaired with a Hemashield path to the non-coronary cusp and a 84m Hemashield straight graft.  Following the procedure, she was taken to the surgical ICU in stable  condition.  Vital signs, cardiac rhythm, and hemodynamics remained stable.  She required minimal vasopressor support following surgery.  On the first postoperative day, the chest tubes and monitoring  lines were removed.  She was mobilized and progressed well.  She was started back on her Zanaflex as she was taking prior to surgery.  By the second postoperative day, she was ambulating short distances.  Chest x-ray showed some mild atelectasis.  Pulmonary hygiene was encouraged.  She was diuresed with oral Lasix on postop day 2.  She developed mild acute kidney injury following surgery so the diuresis was discontinued and creatinine monitor.  She progressed slowly with mobility.  Diet was advanced and well-tolerated.  She was transferred to 4E Progressive Care on postop day 4.  Pacer wires were removed routinely on the following day.  She was evaluated by the physical therapy and Occupational Therapy teams.  Recommendations were for home physical therapy and home occupational therapy.  She has a walker at home.  No additional equipment needs were identified. Appropriate arrangements were made by the care management team.  The patient remained hypertensive and her home blood pressure medications were resumed.  She was ambulating independently.  Her incisions are healing without evidence of infection.  She is medically stable for discharge home today.  Consults: None  Significant Diagnostic Studies:  CLINICAL DATA:  Postoperative.   EXAM: CHEST - 2 VIEW   COMPARISON:  Chest radiograph June 09, 2022.   FINDINGS: Similar small bilateral pleural effusions with overlying left greater than right opacities. No visible pneumothorax on similar enlargement the cardiac silhouette. CABG and median sternotomy. Partially imaged ACDF   IMPRESSION: Similar small bilateral pleural effusions with overlying left greater than right opacities, probably atelectasis.     Electronically Signed   By: Margaretha Sheffield M.Griffith.   On: 06/13/2022 08:10   Treatments: Surgery  Operative Report    DATE OF PROCEDURE: 06/07/2022   PREOPERATIVE DIAGNOSIS:  Sinus of Valsalva aneurysm and ascending aneurysm.    POSTOPERATIVE DIAGNOSIS:  Sinus of Valsalva aneurysm and ascending aneurysm.   PROCEDURE:  Median sternotomy, extracorporeal circulation, patch repair of sinus of Valsalva aneurysm involving noncoronary cusp and ascending aortic aneurysm repair with a 30 mm Hemashield graft.   SURGEON:  Revonda Standard. Roxan Hockey, MD   ASSISTANT:  Enid Cutter, PA   ANESTHESIA:  General.   FINDINGS:  Aneurysmal dilatation of the noncoronary sinus of Valsalva, moderate aortic insufficiency, preserved left ventricular systolic function mildly aneurysmal ascending aorta at 4.3 cm.  Post-bypass transesophageal echocardiography showed mild to  moderate aortic insufficiency with preserved left ventricular systolic function.  Discharge Exam: Blood pressure (!) 152/96, pulse 93, temperature 99.5 F (37.5 C), temperature source Oral, resp. rate 20, height '5\' 1"'$  (1.549 m), weight 87.5 kg, last menstrual period 10/22/2014, SpO2 95 %.  General appearance: alert, cooperative, and no distress Heart: regular rate and rhythm Lungs: clear to auscultation bilaterally Abdomen: soft, non-tender; bowel sounds normal; no masses,  no organomegaly Extremities: extremities normal, atraumatic, no cyanosis or edema Wound: clean and dry  Disposition: Discharge disposition: 01-Home or Self Care     Home   Allergies as of 06/14/2022       Reactions   Aripiprazole Hives, Shortness Of Breath   Reports "gasping for air"   Compazine [prochlorperazine Edisylate]    "tingling in hands and abnormal behavior"   Iohexol Anaphylaxis   "tingling in hands & abnormal behavior"   Phenergan [promethazine Hcl] Anaphylaxis   Reglan [metoclopramide] Anaphylaxis   "  hives"   Zofran [ondansetron] Other (See Comments)   headaches   Clindamycin/lincomycin Swelling   Noticed tongue and mouth swelling after tooth extraction while on clindamycin as well as hoarseness        Medication List     STOP taking these medications     ibuprofen 800 MG tablet Commonly known as: ADVIL       TAKE these medications    acetaminophen 325 MG tablet Commonly known as: TYLENOL Take 2 tablets (650 mg total) by mouth every 6 (six) hours as needed for fever or mild pain.   albuterol (2.5 MG/3ML) 0.083% nebulizer solution Commonly known as: PROVENTIL USE 1 vial via nebulizer EVERY 6 HOURS AS NEEDED FOR WHEEZING OR shortness OF breath   albuterol 108 (90 Base) MCG/ACT inhaler Commonly known as: VENTOLIN HFA Inhale 2 puffs into the lungs every 6 (six) hours as needed for wheezing or shortness of breath.   aspirin EC 325 MG tablet Take 1 tablet (325 mg total) by mouth daily.   budesonide-formoterol 160-4.5 MCG/ACT inhaler Commonly known as: SYMBICORT Inhale 2 puffs into the lungs 2 (two) times daily. What changed:  when to take this reasons to take this   chlorhexidine 0.12 % solution Commonly known as: PERIDEX Use as directed 15 mLs in the mouth or throat 2 (two) times daily.   diclofenac Sodium 1 % Gel Commonly known as: VOLTAREN Apply 2 g topically 4 (four) times daily as needed (pain). Apply to shoulder   EPINEPHrine 0.3 mg/0.3 mL Soaj injection Commonly known as: EPI-PEN Inject 0.3 mg into the muscle as needed for anaphylaxis.   fluticasone 50 MCG/ACT nasal spray Commonly known as: FLONASE USE 2 SPRAYS IN EACH NOSTRIL ONCE A DAY   furosemide 20 MG tablet Commonly known as: LASIX Take 2 tablets (40 mg total) by mouth daily as needed. For swelling   guaiFENesin 600 MG 12 hr tablet Commonly known as: MUCINEX Take 1 tablet (600 mg total) by mouth 2 (two) times daily as needed for to loosen phlegm.   hydrALAZINE 50 MG tablet Commonly known as: APRESOLINE Take 1.5 tablet in the AM and PM and take 2 tablets at night time.   HYDROcodone-acetaminophen 7.5-325 MG tablet Commonly known as: NORCO TAKE 1 TABLET BY MOUTH EVERY 6 HOURS AS NEEDED FOR MODERATE PAIN. Do Not Fill Before 06/ 27/2023   lidocaine  5 % Commonly known as: LIDODERM Place 1 PATCH onto THE SKIN EVERY DAY AS NEEDED. REMOVE AND discard PATCH WITHIN 12 hours OR AS DIRECTED by doctor   loratadine 10 MG tablet Commonly known as: CLARITIN TAKE 1 TABLET BY MOUTH EVERY DAY AS NEEDED FOR allergies What changed:  how much to take how to take this when to take this additional instructions   losartan 100 MG tablet Commonly known as: COZAAR Take 1 tablet (100 mg total) by mouth daily.   LUBRICATING EYE DROPS OP Place 1 drop into both eyes daily as needed (dry eyes).   metoprolol tartrate 25 MG tablet Commonly known as: LOPRESSOR Take 1 tablet (25 mg total) by mouth 2 (two) times daily.   oxybutynin 5 MG tablet Commonly known as: DITROPAN Take 1 tablet (5 mg total) by mouth 2 (two) times daily.   polyethylene glycol powder 17 GM/SCOOP powder Commonly known as: GLYCOLAX/MIRALAX MIX 17 GRAMS in 8 OUNCES OF LIQUID AND DRINK EVERY DAY AS DIRECTED What changed: See the new instructions.   pregabalin 100 MG capsule Commonly known as: LYRICA Take 1 capsule (  100 mg total) by mouth 3 (three) times daily.   Senna Plus 8.6-50 MG tablet Generic drug: senna-docusate TAKE 1 TABLET BY MOUTH AT BEDTIME What changed:  when to take this reasons to take this   tiZANidine 4 MG tablet Commonly known as: ZANAFLEX Take 1 tablet (4 mg total) by mouth 3 (three) times daily.   traZODone 150 MG tablet Commonly known as: DESYREL TAKE 1 TABLET BY MOUTH AT BEDTIME   vitamin A & Griffith ointment Apply 1 application  topically as needed (itching).   Vitamin D3 75 MCG (3000 UT) Tabs Take 1 tablet by mouth daily.        Follow-up Information     Imogene Burn, PA-C. Go on 06/27/2022.   Specialty: Cardiology Why: Your appt is at 9:15am Contact information: [address] [place] [address] [phone]         [facility] [place]. Go on 07/06/2022.   Specialty:  Cardiothoracic Surgery Why: Your appointment is at 1:30 PM Please arrive 30 minutes early for chest x-ray to be performed by [facility] located on the first floor of the same building.  y Sport and exercise psychologist information: [place], [place] Miguel Barrera, [facility] Follow up.   Specialty: [place] Why: HHPT/OT arranged- they will contact you to schedule visits with start of care by the weekend Contact information: [address] [place] [address] [phone]               The patient has been discharged on:   1.Beta Blocker:  Yes [  x ]                              No   [   ]                              If No, reason:  2.Ace Inhibitor/ARB: Yes [  x ]                                     No  [    ]                                     If No, reason:  3.Statin:   Yes [   ]                  No  [ x ]                  If No, reason: No known coronary disease  4.[place]:  Yes  [ x  ]                  No   [   ]                  If No, reason:    Signed:  Note by Enid Cutter PA-C,  Updated by: Ellwood Handler, PA-C 06/14/2022, 9:03 AM

## 2022-06-10 LAB — GLUCOSE, CAPILLARY
Glucose-Capillary: 63 mg/dL — ABNORMAL LOW (ref 70–99)
Glucose-Capillary: 69 mg/dL — ABNORMAL LOW (ref 70–99)
Glucose-Capillary: 81 mg/dL (ref 70–99)
Glucose-Capillary: 56 mg/dL — ABNORMAL LOW (ref 70–99)
Glucose-Capillary: 66 mg/dL — ABNORMAL LOW (ref 70–99)
Glucose-Capillary: 78 mg/dL (ref 70–99)
Glucose-Capillary: 69 mg/dL — ABNORMAL LOW (ref 70–99)
Glucose-Capillary: 93 mg/dL (ref 70–99)
Glucose-Capillary: 117 mg/dL — ABNORMAL HIGH (ref 70–99)
Glucose-Capillary: 102 mg/dL — ABNORMAL HIGH (ref 70–99)

## 2022-06-10 LAB — CBC
HCT: 25.1 % — ABNORMAL LOW (ref 36.0–46.0)
Hemoglobin: 8.2 g/dL — ABNORMAL LOW (ref 12.0–15.0)
MCH: 28.9 pg (ref 26.0–34.0)
MCHC: 32.7 g/dL (ref 30.0–36.0)
MCV: 88.4 fL (ref 80.0–100.0)
Platelets: 120 10*3/uL — ABNORMAL LOW (ref 150–400)
RBC: 2.84 MIL/uL — ABNORMAL LOW (ref 3.87–5.11)
RDW: 14.1 % (ref 11.5–15.5)
WBC: 4.8 10*3/uL (ref 4.0–10.5)
nRBC: 0 % (ref 0.0–0.2)

## 2022-06-10 LAB — BASIC METABOLIC PANEL
Anion gap: 6 (ref 5–15)
BUN: 27 mg/dL — ABNORMAL HIGH (ref 6–20)
CO2: 25 mmol/L (ref 22–32)
Calcium: 7.8 mg/dL — ABNORMAL LOW (ref 8.9–10.3)
Chloride: 106 mmol/L (ref 98–111)
Creatinine, Ser: 1.57 mg/dL — ABNORMAL HIGH (ref 0.44–1.00)
GFR, Estimated: 38 mL/min — ABNORMAL LOW (ref >60)
Glucose, Bld: 81 mg/dL (ref 70–99)
Potassium: 4 mmol/L (ref 3.5–5.1)
Sodium: 137 mmol/L (ref 135–145)

## 2022-06-10 MED ORDER — ORAL CARE MOUTH RINSE: 15.0000 mL | OROMUCOSAL | Status: DC | PRN

## 2022-06-10 MED ORDER — ~~LOC~~ CARDIAC SURGERY, PATIENT & FAMILY EDUCATION
Freq: Once | Status: AC
Start: 2022-06-10 — End: 2022-06-10

## 2022-06-10 MED ORDER — METOPROLOL TARTRATE 25 MG PO TABS
25.0000 mg | ORAL_TABLET | Freq: Two times a day (BID) | ORAL | Status: DC
  Administered 2022-06-10 – 2022-06-14 (×8): 25 mg via ORAL
  Filled 2022-06-10 (×8): qty 1

## 2022-06-10 MED ORDER — LEVALBUTEROL HCL 1.25 MG/0.5ML IN NEBU
1.2500 mg | INHALATION_SOLUTION | Freq: Two times a day (BID) | RESPIRATORY_TRACT | Status: DC
Start: 2022-06-10 — End: 2022-06-12
  Administered 2022-06-10 – 2022-06-12 (×3): 1.25 mg via RESPIRATORY_TRACT
  Filled 2022-06-10 (×4): qty 0.5

## 2022-06-10 MED ORDER — FUROSEMIDE 40 MG PO TABS
40.0000 mg | ORAL_TABLET | Freq: Every day | ORAL | Status: DC
  Administered 2022-06-11: 40 mg via ORAL
  Filled 2022-06-10: qty 1

## 2022-06-10 MED ORDER — AMLODIPINE BESYLATE 5 MG PO TABS
5.0000 mg | ORAL_TABLET | Freq: Once | ORAL | Status: AC
  Administered 2022-06-10: 5 mg via ORAL
  Filled 2022-06-10: qty 1

## 2022-06-10 MED ORDER — INSULIN DETEMIR 100 UNIT/ML ~~LOC~~ SOLN
5.0000 [IU] | Freq: Two times a day (BID) | SUBCUTANEOUS | Status: DC
  Administered 2022-06-10 – 2022-06-11 (×3): 5 [IU] via SUBCUTANEOUS
  Filled 2022-06-10 (×5): qty 0.05

## 2022-06-10 MED ORDER — LEVALBUTEROL HCL 1.25 MG/0.5ML IN NEBU
1.2500 mg | INHALATION_SOLUTION | Freq: Four times a day (QID) | RESPIRATORY_TRACT | Status: DC | PRN
  Administered 2022-06-10 – 2022-06-13 (×3): 1.25 mg via RESPIRATORY_TRACT
  Filled 2022-06-10 (×3): qty 0.5

## 2022-06-10 MED ORDER — METOPROLOL TARTRATE 25 MG/10 ML ORAL SUSPENSION
12.5000 mg | Freq: Two times a day (BID) | ORAL | Status: DC
  Filled 2022-06-10 (×7): qty 5

## 2022-06-10 MED ORDER — SODIUM CHLORIDE 0.9% FLUSH
3.0000 mL | Freq: Two times a day (BID) | INTRAVENOUS | Status: DC
  Administered 2022-06-10 – 2022-06-13 (×8): 3 mL via INTRAVENOUS

## 2022-06-10 MED ORDER — SODIUM CHLORIDE 0.9% FLUSH
3.0000 mL | INTRAVENOUS | Status: DC | PRN
Start: 2022-06-10 — End: 2022-06-14

## 2022-06-10 MED ORDER — SODIUM CHLORIDE 0.9 % IV SOLN
250.0000 mL | INTRAVENOUS | Status: DC | PRN
Start: 2022-06-10 — End: 2022-06-14

## 2022-06-11 ENCOUNTER — Encounter (HOSPITAL_COMMUNITY): Payer: Self-pay | Admitting: Thoracic Surgery (Cardiothoracic Vascular Surgery)

## 2022-06-11 LAB — GLUCOSE, CAPILLARY
Glucose-Capillary: 95 mg/dL (ref 70–99)
Glucose-Capillary: 102 mg/dL — ABNORMAL HIGH (ref 70–99)
Glucose-Capillary: 102 mg/dL — ABNORMAL HIGH (ref 70–99)
Glucose-Capillary: 102 mg/dL — ABNORMAL HIGH (ref 70–99)
Glucose-Capillary: 108 mg/dL — ABNORMAL HIGH (ref 70–99)
Glucose-Capillary: 111 mg/dL — ABNORMAL HIGH (ref 70–99)

## 2022-06-11 LAB — CBC
HCT: 25.6 % — ABNORMAL LOW (ref 36.0–46.0)
Hemoglobin: 8.5 g/dL — ABNORMAL LOW (ref 12.0–15.0)
MCH: 29.2 pg (ref 26.0–34.0)
MCHC: 33.2 g/dL (ref 30.0–36.0)
MCV: 88 fL (ref 80.0–100.0)
Platelets: 165 10*3/uL (ref 150–400)
RBC: 2.91 MIL/uL — ABNORMAL LOW (ref 3.87–5.11)
RDW: 14.2 % (ref 11.5–15.5)
WBC: 6.1 10*3/uL (ref 4.0–10.5)
nRBC: 0 % (ref 0.0–0.2)

## 2022-06-11 LAB — BASIC METABOLIC PANEL
Anion gap: 8 (ref 5–15)
BUN: 27 mg/dL — ABNORMAL HIGH (ref 6–20)
CO2: 25 mmol/L (ref 22–32)
Calcium: 8.3 mg/dL — ABNORMAL LOW (ref 8.9–10.3)
Chloride: 101 mmol/L (ref 98–111)
Creatinine, Ser: 1.61 mg/dL — ABNORMAL HIGH (ref 0.44–1.00)
GFR, Estimated: 37 mL/min — ABNORMAL LOW (ref >60)
Glucose, Bld: 98 mg/dL (ref 70–99)
Potassium: 4.9 mmol/L (ref 3.5–5.1)
Sodium: 134 mmol/L — ABNORMAL LOW (ref 135–145)

## 2022-06-11 MED ORDER — INSULIN ASPART 100 UNIT/ML IJ SOLN: 0.0000 [IU] | Freq: Three times a day (TID) | INTRAMUSCULAR | Status: DC

## 2022-06-11 MED ORDER — CHLORHEXIDINE GLUCONATE CLOTH 2 % EX PADS
6.0000 | MEDICATED_PAD | Freq: Every day | CUTANEOUS | Status: DC
Start: 2022-06-11 — End: 2022-06-14
  Administered 2022-06-11 – 2022-06-14 (×3): 6 via TOPICAL

## 2022-06-12 LAB — GLUCOSE, CAPILLARY
Glucose-Capillary: 92 mg/dL (ref 70–99)
Glucose-Capillary: 105 mg/dL — ABNORMAL HIGH (ref 70–99)
Glucose-Capillary: 93 mg/dL (ref 70–99)
Glucose-Capillary: 111 mg/dL — ABNORMAL HIGH (ref 70–99)

## 2022-06-12 LAB — BASIC METABOLIC PANEL
Anion gap: 5 (ref 5–15)
BUN: 27 mg/dL — ABNORMAL HIGH (ref 6–20)
CO2: 25 mmol/L (ref 22–32)
Calcium: 8 mg/dL — ABNORMAL LOW (ref 8.9–10.3)
Chloride: 107 mmol/L (ref 98–111)
Creatinine, Ser: 1.2 mg/dL — ABNORMAL HIGH (ref 0.44–1.00)
GFR, Estimated: 52 mL/min — ABNORMAL LOW (ref >60)
Glucose, Bld: 94 mg/dL (ref 70–99)
Potassium: 4 mmol/L (ref 3.5–5.1)
Sodium: 137 mmol/L (ref 135–145)

## 2022-06-12 NOTE — Progress Notes (Signed)
CARDIAC REHAB PHASE I   PRE:  Rate/Rhythm: 87 SR  BP:  Sitting: 124/85      SaO2: 94 RA  MODE:  Ambulation: 420 ft   POST:  Rate/Rhythm: 104 ST  BP:  Sitting: 149/ 85     SaO2: 95 RA   Pt out of bed to ambulate in hall with RW. Stopped by bathroom and sink for oral care. Tolerated walk with min SOB. pt took several standing rest breaks. Back to room to complete bath with nursing assistant. IS and continued ambulation encouraged. Will continue to follow.   [id number]  Woodroe Chen, RN BSN 06/12/2022 11:20 AM

## 2022-06-13 ENCOUNTER — Inpatient Hospital Stay (HOSPITAL_COMMUNITY): Payer: Medicare Other

## 2022-06-13 LAB — GLUCOSE, CAPILLARY
Glucose-Capillary: 109 mg/dL — ABNORMAL HIGH (ref 70–99)
Glucose-Capillary: 94 mg/dL (ref 70–99)
Glucose-Capillary: 106 mg/dL — ABNORMAL HIGH (ref 70–99)
Glucose-Capillary: 100 mg/dL — ABNORMAL HIGH (ref 70–99)

## 2022-06-13 MED ORDER — HYDRALAZINE HCL 50 MG PO TABS
50.0000 mg | ORAL_TABLET | Freq: Three times a day (TID) | ORAL | Status: DC
  Administered 2022-06-13 – 2022-06-14 (×3): 50 mg via ORAL
  Filled 2022-06-13 (×3): qty 1

## 2022-06-13 MED ORDER — ACETAMINOPHEN 325 MG PO TABS
650.0000 mg | ORAL_TABLET | Freq: Four times a day (QID) | ORAL | Status: DC | PRN
  Administered 2022-06-13 (×2): 650 mg via ORAL
  Filled 2022-06-13 (×2): qty 2

## 2022-06-13 MED ORDER — LOSARTAN POTASSIUM 50 MG PO TABS
50.0000 mg | ORAL_TABLET | Freq: Every day | ORAL | Status: DC
  Administered 2022-06-13 – 2022-06-14 (×2): 50 mg via ORAL
  Filled 2022-06-13 (×2): qty 1

## 2022-06-13 MED FILL — Heparin Sodium (Porcine) Inj 1000 Unit/ML: INTRAMUSCULAR | Qty: 20 | Status: AC

## 2022-06-13 MED FILL — Sodium Bicarbonate IV Soln 8.4%: INTRAVENOUS | Qty: 50 | Status: AC

## 2022-06-13 MED FILL — Potassium Chloride Inj 2 mEq/ML: INTRAVENOUS | Qty: 40 | Status: AC

## 2022-06-13 MED FILL — Electrolyte-R (PH 7.4) Solution: INTRAVENOUS | Qty: 4000 | Status: AC

## 2022-06-13 MED FILL — Mannitol IV Soln 20%: INTRAVENOUS | Qty: 500 | Status: AC

## 2022-06-13 MED FILL — Lidocaine HCl Local Preservative Free (PF) Inj 2%: INTRAMUSCULAR | Qty: 15 | Status: AC

## 2022-06-13 MED FILL — Sodium Chloride IV Soln 0.9%: INTRAVENOUS | Qty: 2000 | Status: AC

## 2022-06-13 NOTE — Progress Notes (Addendum)
[address]            [place] [address]          [phone]   6 Days Post-Op Procedure(s) (LRB): ASCENDING AORTIC ROOT REPLACEMENT USING HEMASHIELD PLATINUM WOVEN DOUBLE VELOUR VASCULAR GRAFT (N/A) TRANSESOPHAGEAL ECHOCARDIOGRAM (TEE) (N/A)  Total Length of Stay:  LOS: 6 days   Subjective:  Awake and alert, resting in bed this morning.  Had a better day yesterday. More comfortable,  made some progress with mobility.  Objective: Vital signs in last 24 hours: Temp:  [98.3 F (36.8 C)-99.3 F (37.4 C)] 99.1 F (37.3 C) (06/27 0810) Pulse Rate:  [37-97] 87 (06/27 0810) Cardiac Rhythm: Normal sinus rhythm (06/26 1900) Resp:  [16-20] 18 (06/27 0810) BP: (124-163)/(84-105) 131/84 (06/27 0810) SpO2:  [0 %-98 %] 97 % (06/27 0810) FiO2 (%):  [65 %] 65 % (06/26 1959)  Filed Weights   06/10/22 0500 06/11/22 0500 06/12/22 0408  Weight: 87.1 kg 87.1 kg 86.3 kg    Weight change:      Intake/Output from previous day: 06/26 0701 - 06/27 0700 In: 720 [P.O.:720] Out: -   Intake/Output this shift: No intake/output data recorded.  Current Meds: Scheduled Meds:  aspirin EC  325 mg Oral Daily   Or   aspirin  324 mg Per Tube Daily   bisacodyl  10 mg Oral Daily   Or   bisacodyl  10 mg Rectal Daily   Chlorhexidine Gluconate Cloth  6 each Topical Q0600   docusate sodium  200 mg Oral Daily   enoxaparin (LOVENOX) injection  40 mg Subcutaneous QHS   hydrALAZINE  50 mg Oral Q8H   insulin aspart  0-24 Units Subcutaneous TID WC   losartan  50 mg Oral Daily   metoprolol tartrate  25 mg Oral BID   Or   metoprolol tartrate  12.5 mg Per Tube BID   mometasone-formoterol  2 puff Inhalation BID   oxybutynin  5 mg Oral BID   pantoprazole  40 mg Oral Daily   potassium chloride  20 mEq Oral BID   pregabalin  100 mg Oral TID   sodium chloride flush  3 mL Intravenous Q12H   traZODone  150 mg Oral QHS   Continuous Infusions:  sodium chloride      PRN Meds:.sodium chloride, levalbuterol, metoprolol tartrate, morphine injection, ondansetron (ZOFRAN) IV, mouth rinse, oxyCODONE, polyvinyl alcohol, senna-docusate, sodium chloride flush, tiZANidine, traMADol, white petrolatum  General appearance: alert, cooperative, and no distress Neurologic: intact Heart: SR, no murmur, no significant arrhythmias Lungs: breath sounds clear Abdomen: soft,not tender  Extremities: warm and well perfused Wound: The sternotomy incision is intact and dry.  Lab Results: CBC: Recent Labs    06/11/22 0339  WBC 6.1  HGB 8.5*  HCT 25.6*  PLT 165    BMET:  Recent Labs    06/11/22 0339 06/12/22 0930  NA 134* 137  K 4.9 4.0  CL 101 107  CO2 25 25  GLUCOSE 98 94  BUN 27* 27*  CREATININE 1.61* 1.20*  CALCIUM 8.3* 8.0*     CMET: Lab Results  Component Value Date   WBC 6.1 06/11/2022   HGB 8.5 (L) 06/11/2022   HCT 25.6 (L) 06/11/2022   PLT 165 06/11/2022   GLUCOSE 94 06/12/2022   CHOL 145 05/14/2018   TRIG 106 05/14/2018   HDL 41 05/14/2018  LDLCALC 83 05/14/2018   ALT 17 06/05/2022   AST 23 06/05/2022   NA 137 06/12/2022   K 4.0 06/12/2022   CL 107 06/12/2022   CREATININE 1.20 (H) 06/12/2022   BUN 27 (H) 06/12/2022   CO2 25 06/12/2022   TSH 0.944 05/14/2018   INR 1.6 (H) 06/07/2022   HGBA1C 6.2 (H) 06/05/2022      PT/INR:  No results for input(s): "LABPROT", "INR" in the last 72 hours.  Radiology: DG Chest 2 View  Result Date: 06/13/2022 CLINICAL DATA:  Postoperative. EXAM: CHEST - 2 VIEW COMPARISON:  Chest radiograph June 09, 2022. FINDINGS: Similar small bilateral pleural effusions with overlying left greater than right opacities. No visible pneumothorax on similar enlargement the cardiac silhouette. CABG and median sternotomy. Partially imaged ACDF IMPRESSION: Similar small bilateral pleural effusions with overlying left greater than right opacities, probably atelectasis. Electronically Signed   By: Feliberto Harts  M.D.   On: 06/13/2022 08:10     Assessment/Plan: S/P Procedure(s) (LRB): ASCENDING AORTIC ROOT REPLACEMENT USING HEMASHIELD PLATINUM WOVEN DOUBLE VELOUR VASCULAR GRAFT (N/A) TRANSESOPHAGEAL ECHOCARDIOGRAM (TEE) (N/A)  -POD6 repair of aortic root aneurysm. Stable VS and cardiac rhythm.  Progressing slowly with mobility. BP gradually increasing. Will increase the hydralazine and resume losartan at half of home dose today. Continue metoprolol.   -NEURO-intact  -ENDO- no h/o DM and glucose well controlled.  Will d/c CBG's and SSI.    -HEME-Expected acute blood loss anemia, stable.  Tolerating well.  Continue to monitor Hct.   -GI-tolerating diet, appropriate bowel function.  -RENAL-mild postop renal insufficiency as she was diuresed.    Creat decreasing, 1.2 yesterday. OK to resume losartan.  -DVT PPX- to start daily enoxaparin today.   -Disposition-making satisfactory progress.  Progressing with mobility.  Appreciate PT/OT evaluations and care manager's input.  Planning for discharge to home tomorrow with [facility] PT. She has a walker at home.   Leary Roca, PA-C [phone] 06/13/2022 8:20 AM   Agree with above No events Creat down Dispo planning  Malonie Tatum O Denny Mccree

## 2022-06-13 NOTE — TOC Progression Note (Signed)
Transition of Care (TOC) - Progression Note  Donn Pierini RN, BSN Transitions of Care Unit 4E- RN Case Manager See Treatment Team for direct phone #    Patient Details  Name: Maria Griffith MRN: [id number] Date of Birth: [birth year]  Transition of Care [facility]) CM/SW Contact  Zenda Alpers Lenn Sink, RN Phone Number: 06/13/2022, 3:01 PM  Clinical Narrative:    Cm follow up done with pt for [facility] choice, pt's son and niece also at bedside.  Per pt she would like to use [facility] as first choice, [facility] as backup.   Per son, family is working out plan to assist pt on discharge.   Call made to [facility] with [facility] for [facility] referral- referral has been accepted for HHPT/OT- orders for [facility] still need to be placed.    Expected Discharge Plan: Home w Home Health Services Barriers to Discharge: Continued Medical Work up  Expected Discharge Plan and Services Expected Discharge Plan: Home w Home Health Services   Discharge Planning Services: CM Consult Post Acute Care Choice: Home Health Living arrangements for the past 2 months: Single Family Home                 DME Arranged: N/A DME Agency: NA       HH Arranged: PT, OT HH Agency: [facility] Date [facility] Agency Contacted: 06/13/22 Time HH Agency Contacted: 1501 Representative spoke with at [facility] Agency: Kandee Keen   Social Determinants of Health (SDOH) Interventions    Readmission Risk Interventions     No data to display

## 2022-06-13 NOTE — Care Management Important Message (Signed)
Important Message  Patient Details  Name: Maria Griffith MRN: [id number] Date of Birth: [birth year]   Medicare Important Message Given:  Yes     Renie Ora 06/13/2022, 9:05 AM
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
## 2022-06-14 LAB — CBC
HCT: 23.5 % — ABNORMAL LOW (ref 36.0–46.0)
Hemoglobin: 7.7 g/dL — ABNORMAL LOW (ref 12.0–15.0)
MCH: 29.3 pg (ref 26.0–34.0)
MCHC: 32.8 g/dL (ref 30.0–36.0)
MCV: 89.4 fL (ref 80.0–100.0)
Platelets: 234 10*3/uL (ref 150–400)
RBC: 2.63 MIL/uL — ABNORMAL LOW (ref 3.87–5.11)
RDW: 14.2 % (ref 11.5–15.5)
WBC: 3.7 10*3/uL — ABNORMAL LOW (ref 4.0–10.5)
nRBC: 0 % (ref 0.0–0.2)

## 2022-06-14 LAB — BASIC METABOLIC PANEL
Anion gap: 11 (ref 5–15)
BUN: 21 mg/dL — ABNORMAL HIGH (ref 6–20)
CO2: 23 mmol/L (ref 22–32)
Calcium: 8.6 mg/dL — ABNORMAL LOW (ref 8.9–10.3)
Chloride: 104 mmol/L (ref 98–111)
Creatinine, Ser: 1.04 mg/dL — ABNORMAL HIGH (ref 0.44–1.00)
GFR, Estimated: >60 mL/min (ref >60)
Glucose, Bld: 104 mg/dL — ABNORMAL HIGH (ref 70–99)
Potassium: 4.6 mmol/L (ref 3.5–5.1)
Sodium: 138 mmol/L (ref 135–145)

## 2022-06-14 LAB — GLUCOSE, CAPILLARY: Glucose-Capillary: 99 mg/dL (ref 70–99)

## 2022-06-14 MED ORDER — ACETAMINOPHEN 325 MG PO TABS: 650.0000 mg | ORAL_TABLET | Freq: Four times a day (QID) | ORAL | Status: AC | PRN

## 2022-06-14 MED ORDER — METOPROLOL TARTRATE 25 MG PO TABS: 25.0000 mg | ORAL_TABLET | Freq: Two times a day (BID) | ORAL | 3 refills | Status: DC

## 2022-06-14 MED ORDER — GUAIFENESIN ER 600 MG PO TB12: 600.0000 mg | ORAL_TABLET | Freq: Two times a day (BID) | ORAL | Status: DC | PRN

## 2022-06-14 MED ORDER — ASPIRIN 325 MG PO TBEC: 325.0000 mg | DELAYED_RELEASE_TABLET | Freq: Every day | ORAL | Status: DC

## 2022-06-14 NOTE — Progress Notes (Signed)
      [address]       [place] [address]             [phone]      7 Days Post-Op Procedure(s) (LRB): ASCENDING AORTIC ROOT REPLACEMENT USING 30MM HEMASHIELD PLATINUM WOVEN DOUBLE VELOUR VASCULAR GRAFT (N/A) TRANSESOPHAGEAL ECHOCARDIOGRAM (TEE) (N/A)  Subjective:  Patient feels good.  Happy to be going home today.  States her birthday is just around the corner.  + ambulation   + BM  Objective: Vital signs in last 24 hours: Temp:  [98.5 F (36.9 C)-100.5 F (38.1 C)] 99.5 F (37.5 C) (06/28 0810) Pulse Rate:  [77-93] 93 (06/28 0810) Cardiac Rhythm: Normal sinus rhythm (06/28 0700) Resp:  [13-20] 20 (06/28 0810) BP: (116-173)/(68-112) 152/96 (06/28 0810) SpO2:  [92 %-98 %] 95 % (06/28 0814) Weight:  [87.5 kg] 87.5 kg (06/28 0337)  Intake/Output from previous day: 06/27 0701 - 06/28 0700 In: 240 [P.O.:240] Out: -   General appearance: alert, cooperative, and no distress Heart: regular rate and rhythm Lungs: clear to auscultation bilaterally Abdomen: soft, non-tender; bowel sounds normal; no masses,  no organomegaly Extremities: extremities normal, atraumatic, no cyanosis or edema Wound: clean and dry  Lab Results: Recent Labs    06/14/22 0108  WBC 3.7*  HGB 7.7*  HCT 23.5*  PLT 234   BMET:  Recent Labs    06/12/22 0930 06/14/22 0108  NA 137 138  K 4.0 4.6  CL 107 104  CO2 25 23  GLUCOSE 94 104*  BUN 27* 21*  CREATININE 1.20* 1.04*  CALCIUM 8.0* 8.6*    PT/INR: No results for input(s): "LABPROT", "INR" in the last 72 hours. ABG    Component Value Date/Time   PHART 7.346 (L) 06/07/2022 2334   HCO3 21.0 06/07/2022 2334   TCO2 22 06/07/2022 2334   ACIDBASEDEF 4.0 (H) 06/07/2022 2334   O2SAT 96 06/07/2022 2334   CBG (last 3)  Recent Labs    06/13/22 1651 06/13/22 2135 06/14/22 0623  GLUCAP 106* 100* 99    Assessment/Plan: S/P Procedure(s) (LRB): ASCENDING AORTIC ROOT REPLACEMENT USING 30MM HEMASHIELD PLATINUM WOVEN  DOUBLE VELOUR VASCULAR GRAFT (N/A) TRANSESOPHAGEAL ECHOCARDIOGRAM (TEE) (N/A)  1.CV- NSR, + HTN- continue Lopressor, resume home regimen of Cozaar 2. Pulm-no acute issues, continue IS 3. Renal- creatinine improved at 1.04, weight is trending down, continue diuretics 4. ID-no leukocytosis, low grade temp however patient under heavy blankets this morning, no acute signs of infection 5. Deconditioning- mild 6. Dispo- patient stable, will d/c home today.    LOS: 7 days    Ellwood Handler, PA-C 06/14/2022

## 2022-06-14 NOTE — TOC Transition Note (Signed)
Transition of Care (TOC) - CM/SW Discharge Note Maria Gibbons RN, BSN Transitions of Care Unit 4E- RN Case Manager See Treatment Team for direct phone #    Patient Details  Name: Maria Griffith MRN: [id number] Date of Birth: 12-Mar-1961  Transition of Care [facility]) CM/SW Contact:  Maria Patricia, RN Phone Number: 06/14/2022, 10:19 AM   Clinical Narrative:    Pt stable for transition home today, HH has been set up with [facility] for HHPT/OT. Pt informed.  Pt asking about rollator for home, Cardiac rehab at bedside and to assess.   Msg received from Cardiac Rehab/Mobility- pt will need rollator for home. - order placed - and call made to in house provider- [employer] for DME need- rollator to be delivered to room prior to discharge.   Family to transport home.    Final next level of care: [place] Barriers to Discharge: Barriers Resolved   Patient Goals and CMS Choice Patient states their goals for this hospitalization and ongoing recovery are:: return home CMS Medicare.gov Compare Post Acute Care list provided to:: Patient Choice offered to / list presented to : Patient  Discharge Placement             Home w/ [facility]          Discharge Plan and Services   Discharge Planning Services: CM Consult Post Acute Care Choice: Home Health          DME Arranged: Walker rolling with seat DME Agency: [employer] Date DME Agency Contacted: 06/14/22 Time DME Agency Contacted: [id number] Representative spoke with at DME Agency: Jodell Cipro HH Arranged: PT, OT [place] Agency: [place] Date [place]: 06/13/22 Time [place]: 1501 Representative spoke with at [place]: [place] ([place]) Interventions     Readmission Risk Interventions    06/14/2022   10:19 AM  Readmission Risk Prevention Plan  Transportation Screening Complete  PCP or Specialist Appt within 5-7 Days Complete  Home Care Screening Complete   Medication Review (RN CM) Complete

## 2022-06-14 NOTE — Progress Notes (Signed)
CT sutures removed as per orders.  Discharge instructions reviewed.  Pt expresses understanding.  Niece at bedside and for transport to home.

## 2022-06-14 NOTE — Progress Notes (Addendum)
Seen pt from [phone] pt was ambulated through hallway with light assistance from gait belt and rollator. During ambulation pt needed multiple breaks for SOB while grimacing. Pt denies dizziness and pain but was SOB.  Pt was returned to chair and educated on restrictions, site care, ex guidelines, heart healthy diet, and CRPII. Pt is being referred to CRPII at [facility].   Distance 434f  JChristen Bame6/28/2023 11:07 AM   JChristen Bame 11:02 AM 06/14/2022

## 2022-06-15 ENCOUNTER — Telehealth: Payer: Self-pay

## 2022-06-15 ENCOUNTER — Other Ambulatory Visit: Payer: Self-pay | Admitting: Family Medicine

## 2022-06-15 NOTE — Telephone Encounter (Signed)
Patient contacted the office stating she was experiencing stomach aches. She is s/p aortic root replacement with Dr. Roxan Hockey 6/21. She states that she has not had a bowel movement in 2-3 days, goes every other day normally. Her god-daughter states that she has not eaten much and she is taking her pain/scheduled medications. Advised that she can take over-the-counter Miralax and Colace or Sen[place]ot if needed to help with a bowel movement. She states that she is laying down after eating her meals. Advised to stay in up-right position for at least 45 mins- 1 hr after eating. Also advised to take her pain medications with food as this can cause upset stomach. Advised that she needed to contact her PCP for further evaluation if this does not resolve within the next couple days. Patient and family acknowledged receipt.

## 2022-06-16 LAB — BPAM RBC
Blood Product Expiration Date: 202307102359
Blood Product Expiration Date: 202307102359
Blood Product Expiration Date: 202307102359
Blood Product Expiration Date: 202307102359
ISSUE DATE / TIME: 202306210922
ISSUE DATE / TIME: 202306240901
ISSUE DATE / TIME: 202306251040
Unit Type and Rh: 6200
Unit Type and Rh: 6200
Unit Type and Rh: 6200
Unit Type and Rh: 6200

## 2022-06-16 LAB — TYPE AND SCREEN
ABO/RH(D): A POS
Antibody Screen: NEGATIVE
Unit division: 0
Unit division: 0
Unit division: 0
Unit division: 0

## 2022-06-17 DIAGNOSIS — Z48812 Encounter for surgical aftercare following surgery on the circulatory system: Secondary | ICD-10-CM | POA: Diagnosis not present

## 2022-06-17 DIAGNOSIS — I110 Hypertensive heart disease with heart failure: Secondary | ICD-10-CM | POA: Diagnosis not present

## 2022-06-17 DIAGNOSIS — I5032 Chronic diastolic (congestive) heart failure: Secondary | ICD-10-CM | POA: Diagnosis not present

## 2022-06-17 DIAGNOSIS — D62 Acute posthemorrhagic anemia: Secondary | ICD-10-CM | POA: Diagnosis not present

## 2022-06-17 DIAGNOSIS — N179 Acute kidney failure, unspecified: Secondary | ICD-10-CM | POA: Diagnosis not present

## 2022-06-21 DIAGNOSIS — Z48812 Encounter for surgical aftercare following surgery on the circulatory system: Secondary | ICD-10-CM | POA: Diagnosis not present

## 2022-06-21 DIAGNOSIS — I5032 Chronic diastolic (congestive) heart failure: Secondary | ICD-10-CM | POA: Diagnosis not present

## 2022-06-21 DIAGNOSIS — D62 Acute posthemorrhagic anemia: Secondary | ICD-10-CM | POA: Diagnosis not present

## 2022-06-21 DIAGNOSIS — I110 Hypertensive heart disease with heart failure: Secondary | ICD-10-CM | POA: Diagnosis not present

## 2022-06-21 DIAGNOSIS — N179 Acute kidney failure, unspecified: Secondary | ICD-10-CM | POA: Diagnosis not present

## 2022-06-22 ENCOUNTER — Telehealth: Payer: Self-pay

## 2022-06-22 DIAGNOSIS — Z48812 Encounter for surgical aftercare following surgery on the circulatory system: Secondary | ICD-10-CM | POA: Diagnosis not present

## 2022-06-22 DIAGNOSIS — I5032 Chronic diastolic (congestive) heart failure: Secondary | ICD-10-CM | POA: Diagnosis not present

## 2022-06-22 DIAGNOSIS — I110 Hypertensive heart disease with heart failure: Secondary | ICD-10-CM | POA: Diagnosis not present

## 2022-06-22 DIAGNOSIS — D62 Acute posthemorrhagic anemia: Secondary | ICD-10-CM | POA: Diagnosis not present

## 2022-06-22 DIAGNOSIS — N179 Acute kidney failure, unspecified: Secondary | ICD-10-CM | POA: Diagnosis not present

## 2022-06-22 NOTE — Telephone Encounter (Signed)
[facility] RN calls nurse line requesting verbal orders for [facility] nursing as follows.   1x a week for 4 weeks   Speech evaluation   Verbal orders given.

## 2022-06-23 DIAGNOSIS — I5032 Chronic diastolic (congestive) heart failure: Secondary | ICD-10-CM | POA: Diagnosis not present

## 2022-06-23 DIAGNOSIS — Z48812 Encounter for surgical aftercare following surgery on the circulatory system: Secondary | ICD-10-CM | POA: Diagnosis not present

## 2022-06-23 DIAGNOSIS — I110 Hypertensive heart disease with heart failure: Secondary | ICD-10-CM | POA: Diagnosis not present

## 2022-06-23 DIAGNOSIS — N179 Acute kidney failure, unspecified: Secondary | ICD-10-CM | POA: Diagnosis not present

## 2022-06-23 DIAGNOSIS — D62 Acute posthemorrhagic anemia: Secondary | ICD-10-CM | POA: Diagnosis not present

## 2022-06-26 ENCOUNTER — Telehealth: Payer: Self-pay | Admitting: *Deleted

## 2022-06-26 DIAGNOSIS — D62 Acute posthemorrhagic anemia: Secondary | ICD-10-CM | POA: Diagnosis not present

## 2022-06-26 DIAGNOSIS — I5032 Chronic diastolic (congestive) heart failure: Secondary | ICD-10-CM | POA: Diagnosis not present

## 2022-06-26 DIAGNOSIS — N179 Acute kidney failure, unspecified: Secondary | ICD-10-CM | POA: Diagnosis not present

## 2022-06-26 DIAGNOSIS — Z48812 Encounter for surgical aftercare following surgery on the circulatory system: Secondary | ICD-10-CM | POA: Diagnosis not present

## 2022-06-26 DIAGNOSIS — I110 Hypertensive heart disease with heart failure: Secondary | ICD-10-CM | POA: Diagnosis not present

## 2022-06-26 NOTE — Telephone Encounter (Signed)
Verbal orders provided to [facility] with [facility] for 1x/week for 2 weeks and 1x/week for 1 week.

## 2022-06-27 ENCOUNTER — Ambulatory Visit (INDEPENDENT_AMBULATORY_CARE_PROVIDER_SITE_OTHER): Payer: Medicare Other | Admitting: Physician Assistant

## 2022-06-27 ENCOUNTER — Encounter: Payer: Self-pay | Admitting: Physician Assistant

## 2022-06-27 ENCOUNTER — Ambulatory Visit: Payer: Medicare Other | Admitting: Physician Assistant

## 2022-06-27 ENCOUNTER — Other Ambulatory Visit: Payer: Self-pay | Admitting: Thoracic Surgery (Cardiothoracic Vascular Surgery)

## 2022-06-27 VITALS — BP 130/90 | HR 101 | Ht 61.0 in | Wt 175.0 lb

## 2022-06-27 DIAGNOSIS — Z8679 Personal history of other diseases of the circulatory system: Secondary | ICD-10-CM | POA: Diagnosis not present

## 2022-06-27 DIAGNOSIS — Z48812 Encounter for surgical aftercare following surgery on the circulatory system: Secondary | ICD-10-CM | POA: Diagnosis not present

## 2022-06-27 DIAGNOSIS — I2510 Atherosclerotic heart disease of native coronary artery without angina pectoris: Secondary | ICD-10-CM

## 2022-06-27 DIAGNOSIS — I10 Essential (primary) hypertension: Secondary | ICD-10-CM

## 2022-06-27 DIAGNOSIS — I5032 Chronic diastolic (congestive) heart failure: Secondary | ICD-10-CM | POA: Diagnosis not present

## 2022-06-27 DIAGNOSIS — D62 Acute posthemorrhagic anemia: Secondary | ICD-10-CM | POA: Diagnosis not present

## 2022-06-27 DIAGNOSIS — I110 Hypertensive heart disease with heart failure: Secondary | ICD-10-CM

## 2022-06-27 DIAGNOSIS — R42 Dizziness and giddiness: Secondary | ICD-10-CM | POA: Diagnosis not present

## 2022-06-27 DIAGNOSIS — Z9889 Other specified postprocedural states: Secondary | ICD-10-CM

## 2022-06-27 DIAGNOSIS — N179 Acute kidney failure, unspecified: Secondary | ICD-10-CM | POA: Diagnosis not present
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
## 2022-06-27 NOTE — Patient Instructions (Addendum)
Medication Instructions:  Your physician recommends that you continue on your current medications as directed. Please refer to the Current Medication list given to you today.  *If you need a refill on your cardiac medications before your next appointment, please call your pharmacy*  Lab Work: NONE  Testing/Procedures: NONE  Follow-Up: At [facility], you and your health needs are our priority.  As part of our continuing mission to provide you with exceptional heart care, we have created designated Provider Care Teams.  These Care Teams include your primary Cardiologist (physician) and Advanced Practice Providers (APPs -  Physician Assistants and Nurse Practitioners) who all work together to provide you with the care you need, when you need it.  Your next appointment:   3 month(s)  The format for your next appointment:   In Person  Provider:   Lauree Chandler, MD {  Other Instructions These were your blood pressure readings from today: 132/98 (heart rate 98) lying 130/96 (heart rate 101) sitting 128/90 (heart rate 110) standing  Important Information About Sugar

## 2022-06-28 DIAGNOSIS — I110 Hypertensive heart disease with heart failure: Secondary | ICD-10-CM | POA: Diagnosis not present

## 2022-06-28 DIAGNOSIS — D62 Acute posthemorrhagic anemia: Secondary | ICD-10-CM | POA: Diagnosis not present

## 2022-06-28 DIAGNOSIS — N179 Acute kidney failure, unspecified: Secondary | ICD-10-CM | POA: Diagnosis not present

## 2022-06-28 DIAGNOSIS — Z48812 Encounter for surgical aftercare following surgery on the circulatory system: Secondary | ICD-10-CM | POA: Diagnosis not present

## 2022-06-28 DIAGNOSIS — I5032 Chronic diastolic (congestive) heart failure: Secondary | ICD-10-CM | POA: Diagnosis not present

## 2022-06-29 ENCOUNTER — Telehealth: Payer: Self-pay

## 2022-06-29 ENCOUNTER — Other Ambulatory Visit: Payer: Self-pay | Admitting: Family Medicine

## 2022-06-29 DIAGNOSIS — I110 Hypertensive heart disease with heart failure: Secondary | ICD-10-CM | POA: Diagnosis not present

## 2022-06-29 DIAGNOSIS — J984 Other disorders of lung: Secondary | ICD-10-CM

## 2022-06-29 DIAGNOSIS — D62 Acute posthemorrhagic anemia: Secondary | ICD-10-CM | POA: Diagnosis not present

## 2022-06-29 DIAGNOSIS — I5032 Chronic diastolic (congestive) heart failure: Secondary | ICD-10-CM | POA: Diagnosis not present

## 2022-06-29 DIAGNOSIS — Z48812 Encounter for surgical aftercare following surgery on the circulatory system: Secondary | ICD-10-CM | POA: Diagnosis not present

## 2022-06-29 DIAGNOSIS — N179 Acute kidney failure, unspecified: Secondary | ICD-10-CM | POA: Diagnosis not present

## 2022-06-29 NOTE — Telephone Encounter (Signed)
Received phone call from [facility] RN regarding patient. Reports that patient has lost nebulizer machine and is requesting order to be sent to [facility]. Please route back to RN team once order has been placed.   Patient has also been experiencing nausea and indigestion for the past few days. Denies abdominal pain, vomiting, or diarrhea. States that during visit patient was "belching a few times."   Patient reports allergies to multiple anit- nausea medications. RN is requesting order for protonix.   Please advise if this can be sent to patient's pharmacy ([place])

## 2022-06-29 NOTE — Progress Notes (Signed)
Community message sent to [facility]. Will await response.   Raesha Coonrod C Sabir Charters, RN  

## 2022-06-29 NOTE — Progress Notes (Signed)
[facility] received order.   Talbot Grumbling, RN

## 2022-06-29 NOTE — Telephone Encounter (Signed)
Hello blue team CMA,   Please contact patient to help her schedule an appointment with me soon for indigestion. Thanks.

## 2022-06-30 DIAGNOSIS — J984 Other disorders of lung: Secondary | ICD-10-CM | POA: Diagnosis not present

## 2022-06-30 DIAGNOSIS — J45909 Unspecified asthma, uncomplicated: Secondary | ICD-10-CM | POA: Diagnosis not present

## 2022-07-04 ENCOUNTER — Telehealth: Payer: Self-pay | Admitting: Cardiovascular Disease

## 2022-07-04 DIAGNOSIS — I110 Hypertensive heart disease with heart failure: Secondary | ICD-10-CM | POA: Diagnosis not present

## 2022-07-04 DIAGNOSIS — Z48812 Encounter for surgical aftercare following surgery on the circulatory system: Secondary | ICD-10-CM | POA: Diagnosis not present

## 2022-07-04 DIAGNOSIS — I5032 Chronic diastolic (congestive) heart failure: Secondary | ICD-10-CM | POA: Diagnosis not present

## 2022-07-04 DIAGNOSIS — N179 Acute kidney failure, unspecified: Secondary | ICD-10-CM | POA: Diagnosis not present

## 2022-07-04 DIAGNOSIS — D62 Acute posthemorrhagic anemia: Secondary | ICD-10-CM | POA: Diagnosis not present

## 2022-07-04 NOTE — Telephone Encounter (Signed)
Informed HH of last OV note, from last week. Advised that pt should f/u w/ PCP per our recommendation last week. They appreciate the call back and will f/u w/ PCP on this matter.

## 2022-07-04 NOTE — Telephone Encounter (Signed)
STAT if patient feels like he/she is going to faint   Are you dizzy now? No  Do you feel faint or have you passed out? No  Do you have any other symptoms?   Have you checked your HR and BP (record if available)? No   [place], is calling in regards to this patients dizziness. He would like a call back to discuss a possible med to help with. He believes it is due to vertigo and would like to discuss this.

## 2022-07-05 ENCOUNTER — Other Ambulatory Visit: Payer: Self-pay | Admitting: Family Medicine

## 2022-07-06 ENCOUNTER — Encounter: Payer: Self-pay | Admitting: Physician Assistant

## 2022-07-06 ENCOUNTER — Ambulatory Visit (INDEPENDENT_AMBULATORY_CARE_PROVIDER_SITE_OTHER): Payer: Self-pay | Admitting: Physician Assistant

## 2022-07-06 ENCOUNTER — Ambulatory Visit
Admission: RE | Admit: 2022-07-06 | Discharge: 2022-07-06 | Disposition: A | Discharge status: Home / Self Care | Payer: Medicare Other | Source: Ambulatory Visit | Attending: Thoracic Surgery (Cardiothoracic Vascular Surgery) | Admitting: Thoracic Surgery (Cardiothoracic Vascular Surgery)

## 2022-07-06 ENCOUNTER — Encounter: Payer: No Typology Code available for payment source | Admitting: Registered Nurse

## 2022-07-06 VITALS — BP 94/59 | HR 69 | Resp 20 | Ht 61.0 in | Wt 175.0 lb

## 2022-07-06 DIAGNOSIS — Z8679 Personal history of other diseases of the circulatory system: Secondary | ICD-10-CM

## 2022-07-06 DIAGNOSIS — I700 Atherosclerosis of aorta: Secondary | ICD-10-CM | POA: Diagnosis not present

## 2022-07-06 DIAGNOSIS — I5032 Chronic diastolic (congestive) heart failure: Secondary | ICD-10-CM | POA: Diagnosis not present

## 2022-07-06 DIAGNOSIS — I110 Hypertensive heart disease with heart failure: Secondary | ICD-10-CM | POA: Diagnosis not present

## 2022-07-06 DIAGNOSIS — I517 Cardiomegaly: Secondary | ICD-10-CM | POA: Diagnosis not present

## 2022-07-06 DIAGNOSIS — Z9889 Other specified postprocedural states: Secondary | ICD-10-CM

## 2022-07-06 DIAGNOSIS — Z48812 Encounter for surgical aftercare following surgery on the circulatory system: Secondary | ICD-10-CM | POA: Diagnosis not present

## 2022-07-06 DIAGNOSIS — R42 Dizziness and giddiness: Secondary | ICD-10-CM | POA: Diagnosis not present

## 2022-07-06 DIAGNOSIS — D62 Acute posthemorrhagic anemia: Secondary | ICD-10-CM | POA: Diagnosis not present

## 2022-07-06 DIAGNOSIS — I952 Hypotension due to drugs: Secondary | ICD-10-CM

## 2022-07-06 DIAGNOSIS — J90 Pleural effusion, not elsewhere classified: Secondary | ICD-10-CM | POA: Diagnosis not present

## 2022-07-06 DIAGNOSIS — N179 Acute kidney failure, unspecified: Secondary | ICD-10-CM | POA: Diagnosis not present

## 2022-07-06 NOTE — Patient Instructions (Signed)
You may gradually increase her activities of cooking and cleaning as long as you avoid lifting, pushing, or pulling greater than 10 pounds.  You should not resume driving until cleared by Dr. Leonarda Salon office.  Please decrease your metoprolol dose to 1/2 tablet (or 12.5 mg) by mouth twice daily.

## 2022-07-06 NOTE — Progress Notes (Signed)
[address]       [place] [address]             [phone]       HPI: [name] is a 61 year old female with a history of hypertension, chronic diastolic heart failure, chronic pain with neuropathy, depression, who was referred to [place] with a sinus of Valsalva aneurysm and moderate aortic insufficiency.  Patient returns for routine postoperative follow-up having undergone repair of sinus of Valsalva and ascending aortic aneurysms Dr. Roxan Hockey on 06/07/2022. The patient's early postoperative recovery while in the hospital was uneventful and she was discharged on postop day 7. She was seen in follow-up by the cardiology service about 10 days ago and reported she was not taking her metoprolol due to nausea after the first dose.  Nausea resolved after about a week.  She was encouraged to restart the metoprolol by the cardiology team. Since hospital discharge the patient reports some dizziness off and on that she believes is worsened since she started taking the metoprolol.  She continues to take hydrocodone 7.5 mg twice daily and Lyrica for chronic pain.   Current Outpatient Medications  Medication Sig Dispense Refill   acetaminophen (TYLENOL) 325 MG tablet Take 2 tablets (650 mg total) by mouth every 6 (six) hours as needed for fever or mild pain.     albuterol (PROVENTIL) (2.5 MG/3ML) 0.083% nebulizer solution USE 1 vial via nebulizer EVERY 6 HOURS AS NEEDED FOR WHEEZING AND SHORTNESS OF BREATH 90 mL 3   albuterol (VENTOLIN HFA) 108 (90 Base) MCG/ACT inhaler INHALE 2 PUFFS INTO THE LUNGS EVERY 6 HOURS AS NEEDED FOR WHEEZING 18 g 3   aspirin EC 325 MG tablet Take 1 tablet (325 mg total) by mouth daily.     budesonide-formoterol (SYMBICORT) 160-4.5 MCG/ACT inhaler Inhale 2 puffs into the lungs 2 (two) times daily. (Patient taking differently: Inhale 2 puffs into the lungs 2 (two) times daily as needed (shortness of breath).) 10.2 g 4   Carboxymethylcellul-Glycerin  (LUBRICATING EYE DROPS OP) Place 1 drop into both eyes daily as needed (dry eyes).     chlorhexidine (PERIDEX) 0.12 % solution Use as directed 15 mLs in the mouth or throat 2 (two) times daily.     Cholecalciferol (VITAMIN D3) 75 MCG (3000 UT) TABS Take 1 tablet by mouth daily. 90 tablet 2   diclofenac Sodium (VOLTAREN) 1 % GEL Apply 2 g topically 4 (four) times daily as needed (pain). Apply to shoulder 100 g 1   EPINEPHrine 0.3 mg/0.3 mL IJ SOAJ injection Inject 0.3 mg into the muscle as needed for anaphylaxis. 1 each 1   fluticasone (FLONASE) 50 MCG/ACT nasal spray USE 2 SPRAYS IN EACH NOSTRIL ONCE A DAY 16 g 6   furosemide (LASIX) 20 MG tablet Take 2 tablets (40 mg total) by mouth daily as needed. For swelling 60 tablet 11   guaiFENesin (MUCINEX) 600 MG 12 hr tablet Take 1 tablet (600 mg total) by mouth 2 (two) times daily as needed for to loosen phlegm.     hydrALAZINE (APRESOLINE) 50 MG tablet Take 1.5 tablet in the AM and PM and take 2 tablets at night time. 270 tablet 3   HYDROcodone-acetaminophen (NORCO) 7.5-325 MG tablet TAKE 1 TABLET BY MOUTH EVERY 6 HOURS AS NEEDED FOR MODERATE PAIN. Do Not Fill Before 06/ 27/2023 120 tablet 0   lidocaine (LIDODERM) 5 % Place 1 PATCH onto THE SKIN EVERY DAY AS NEEDED. REMOVE AND discard  PATCH WITHIN 12 hours OR AS DIRECTED by doctor 90 patch 0   loratadine (CLARITIN) 10 MG tablet TAKE 1 TABLET BY MOUTH EVERY DAY AS NEEDED FOR allergies 90 tablet 1   losartan (COZAAR) 100 MG tablet Take 1 tablet (100 mg total) by mouth daily. 90 tablet 1   metoprolol tartrate (LOPRESSOR) 25 MG tablet Take 1 tablet (25 mg total) by mouth 2 (two) times daily. 60 tablet 3   oxybutynin (DITROPAN) 5 MG tablet Take 1 tablet (5 mg total) by mouth 2 (two) times daily. 180 tablet 1   polyethylene glycol powder (GLYCOLAX/MIRALAX) 17 GM/SCOOP powder MIX 17 GRAMS in 8 OUNCES OF LIQUID AND DRINK EVERY DAY AS DIRECTED (Patient taking differently: Take 17 g by mouth daily as needed for  moderate constipation.) 510 g 2   pregabalin (LYRICA) 100 MG capsule Take 1 capsule (100 mg total) by mouth 3 (three) times daily. 90 capsule 3   SENNA PLUS 8.6-50 MG tablet TAKE 1 TABLET BY MOUTH AT BEDTIME (Patient taking differently: Take 1 tablet by mouth at bedtime as needed for mild constipation.) 30 tablet 5   tiZANidine (ZANAFLEX) 4 MG tablet Take 1 tablet (4 mg total) by mouth 3 (three) times daily. 90 tablet 3   traZODone (DESYREL) 150 MG tablet TAKE 1 TABLET BY MOUTH AT BEDTIME 90 tablet 3   Vitamins A & D (VITAMIN A & D) ointment Apply 1 application  topically as needed (itching).     No current facility-administered medications for this visit.    Physical Exam: Vital signs BP: 94/59 Pulse 69 Respirations 20 SPO2 96% on room air  General: [name] arrived today using a four-wheel walker was pretty good mobility.  She is alert and oriented and skin is warm and dry Heart: Regular rate and rhythm.  She has a very soft systolic murmur. Chest: Symmetrical with clear breath sounds.  The sternotomy incision is well-healed and the sternum is stable.  Chest tube sites are also healing. Extremities: All are warm and well-perfused with no peripheral edema.   Diagnostic Tests: CLINICAL DATA:  Dizziness, history of aortic aneurysm repair   EXAM: CHEST - 2 VIEW   COMPARISON:  Radiographs 06/13/2022   FINDINGS: Left basilar and mid lung atelectasis or infiltrate. Small left and trace right pleural effusions. Cardiomegaly. Median sternotomy. Aortic calcifications. Cervical spine fusion hardware.   IMPRESSION: Minimal change from 06/13/2022. Small pleural effusions with left basilar opacities, likely atelectasis.     Electronically Signed   By: Placido Sou M.D.   On: 07/06/2022 14:46  Impression / Plan: -[name] is now about 3 weeks post repair of sinus of Valsalva aneurysm.  She is slowly progressing.  Her blood pressure is relatively low in the office today and I  think this may be contributing to her dizziness.  I asked her to decrease the metoprolol dose to 12.5 mg twice daily.  Her blood pressure was around 130/90 10 days ago at the cardiology office before she started the metoprolol.  I do not think she should resume driving.  She did ask about gradually increasing her activity which is encouraged as long as she continues to observe sternal precautions as previously instructed.  We will plan to see her back in 1 month.    Antony Odea, PA-C [facility] [phone]

## 2022-07-07 DIAGNOSIS — N179 Acute kidney failure, unspecified: Secondary | ICD-10-CM | POA: Diagnosis not present

## 2022-07-07 DIAGNOSIS — D62 Acute posthemorrhagic anemia: Secondary | ICD-10-CM | POA: Diagnosis not present

## 2022-07-07 DIAGNOSIS — I5032 Chronic diastolic (congestive) heart failure: Secondary | ICD-10-CM | POA: Diagnosis not present

## 2022-07-07 DIAGNOSIS — Z48812 Encounter for surgical aftercare following surgery on the circulatory system: Secondary | ICD-10-CM | POA: Diagnosis not present

## 2022-07-07 DIAGNOSIS — I110 Hypertensive heart disease with heart failure: Secondary | ICD-10-CM | POA: Diagnosis not present

## 2022-07-08 ENCOUNTER — Telehealth: Payer: Self-pay | Admitting: Physician Assistant

## 2022-07-08 DIAGNOSIS — I110 Hypertensive heart disease with heart failure: Secondary | ICD-10-CM | POA: Diagnosis not present

## 2022-07-08 DIAGNOSIS — I5032 Chronic diastolic (congestive) heart failure: Secondary | ICD-10-CM | POA: Diagnosis not present

## 2022-07-08 DIAGNOSIS — N179 Acute kidney failure, unspecified: Secondary | ICD-10-CM | POA: Diagnosis not present

## 2022-07-08 DIAGNOSIS — D62 Acute posthemorrhagic anemia: Secondary | ICD-10-CM | POA: Diagnosis not present

## 2022-07-08 DIAGNOSIS — Z48812 Encounter for surgical aftercare following surgery on the circulatory system: Secondary | ICD-10-CM | POA: Diagnosis not present

## 2022-07-08 NOTE — Telephone Encounter (Signed)
Patient by physical therapy Reed Pandy who saw Mrs. Renato Battles today.  Her blood pressure was 90 systolic, this drops further upon standing.  She is also feeling dizzy as well.  She was recently seen by cardiothoracic surgery for posthospital follow-up after aortic root replacement by Dr. Roxan Hockey in June.  She was noted to be hypotensive during the office visit and was instructed to decrease metoprolol to 12.5 mg twice a day.  According to Reed Pandy who is a physical therapist who saw her today, her blood pressure remain low.  Unfortunately she has taken all of her blood pressure medications including hydralazine, metoprolol and 100 mg of losartan this morning.  I instructed the patient to stop the hydralazine completely.  Continue on the metoprolol, drink more fluid to help raise her blood pressure.  As far as whether or not to take losartan tomorrow morning, this will need to be dependent on her blood pressure.  If her systolic blood pressure is less than 120 mmHg, she should continue to hold losartan, however if her systolic blood pressure is above 120 mmHg, she may take the losartan.  She will need an earlier cardiology follow-up.  She is aware that if she her dizzy spell gets worse, if she has any feeling of passing out or her blood pressure does not come up, she need to go to the nearest emergency room by calling 911.  Please arrange earlier cardiology follow-up, will forward to our triage team

## 2022-07-10 DIAGNOSIS — I110 Hypertensive heart disease with heart failure: Secondary | ICD-10-CM | POA: Diagnosis not present

## 2022-07-10 DIAGNOSIS — I5032 Chronic diastolic (congestive) heart failure: Secondary | ICD-10-CM | POA: Diagnosis not present

## 2022-07-10 DIAGNOSIS — Z48812 Encounter for surgical aftercare following surgery on the circulatory system: Secondary | ICD-10-CM | POA: Diagnosis not present

## 2022-07-10 DIAGNOSIS — N179 Acute kidney failure, unspecified: Secondary | ICD-10-CM | POA: Diagnosis not present

## 2022-07-10 DIAGNOSIS — D62 Acute posthemorrhagic anemia: Secondary | ICD-10-CM | POA: Diagnosis not present

## 2022-07-10 NOTE — Telephone Encounter (Signed)
[place] nurse calls nurse line again in regards to nausea and reflux.   Beth reports nausea and indigestion have been persistent since discharge. Patient also complains of dizziness. Patient does have a cards apt on 7/27 and they have been following her for this.   Patient scheduled for an afternoon apt per preference next week.

## 2022-07-10 NOTE — Telephone Encounter (Addendum)
Spoke with the patient.  Her [place] [name] [phone]) present for most of the call.  Checked pt's wrist cuff against her own cuff.  140s/80s on both.  Wrist cuff accurate.  The pt has been scheduled for follow up with Dr. Angelena Form this Thursday.  She gets lightheaded after she has been up walking a bit.  The home nurse reports she is pale.    Denies dark stools or blood in urine.  Her last hgb was 7.7 on June 28.  Discontinued hydralazine from med list.  Aware of rec to hold losartan for SBP<120.

## 2022-07-10 NOTE — Addendum Note (Signed)
Addended by: Rodman Key on: 07/10/2022 12:44 PM   Modules accepted: Orders

## 2022-07-11 DIAGNOSIS — I110 Hypertensive heart disease with heart failure: Secondary | ICD-10-CM | POA: Diagnosis not present

## 2022-07-11 DIAGNOSIS — N179 Acute kidney failure, unspecified: Secondary | ICD-10-CM | POA: Diagnosis not present

## 2022-07-11 DIAGNOSIS — D62 Acute posthemorrhagic anemia: Secondary | ICD-10-CM | POA: Diagnosis not present

## 2022-07-11 DIAGNOSIS — Z48812 Encounter for surgical aftercare following surgery on the circulatory system: Secondary | ICD-10-CM | POA: Diagnosis not present

## 2022-07-11 DIAGNOSIS — I5032 Chronic diastolic (congestive) heart failure: Secondary | ICD-10-CM | POA: Diagnosis not present

## 2022-07-13 ENCOUNTER — Encounter: Payer: Self-pay | Admitting: Cardiovascular Disease

## 2022-07-13 ENCOUNTER — Ambulatory Visit (INDEPENDENT_AMBULATORY_CARE_PROVIDER_SITE_OTHER): Payer: Medicare Other | Admitting: Cardiovascular Disease

## 2022-07-13 VITALS — BP 91/62 | HR 67 | Ht 61.0 in | Wt 171.8 lb

## 2022-07-13 DIAGNOSIS — I5032 Chronic diastolic (congestive) heart failure: Secondary | ICD-10-CM

## 2022-07-13 DIAGNOSIS — Z48812 Encounter for surgical aftercare following surgery on the circulatory system: Secondary | ICD-10-CM | POA: Diagnosis not present

## 2022-07-13 DIAGNOSIS — I2510 Atherosclerotic heart disease of native coronary artery without angina pectoris: Secondary | ICD-10-CM

## 2022-07-13 DIAGNOSIS — Z8679 Personal history of other diseases of the circulatory system: Secondary | ICD-10-CM

## 2022-07-13 DIAGNOSIS — D62 Acute posthemorrhagic anemia: Secondary | ICD-10-CM | POA: Diagnosis not present

## 2022-07-13 DIAGNOSIS — Z9889 Other specified postprocedural states: Secondary | ICD-10-CM | POA: Diagnosis not present

## 2022-07-13 DIAGNOSIS — I10 Essential (primary) hypertension: Secondary | ICD-10-CM

## 2022-07-13 DIAGNOSIS — R42 Dizziness and giddiness: Secondary | ICD-10-CM | POA: Diagnosis not present

## 2022-07-13 DIAGNOSIS — I110 Hypertensive heart disease with heart failure: Secondary | ICD-10-CM | POA: Diagnosis not present

## 2022-07-13 DIAGNOSIS — N179 Acute kidney failure, unspecified: Secondary | ICD-10-CM | POA: Diagnosis not present

## 2022-07-13 MED ORDER — LOSARTAN POTASSIUM 50 MG PO TABS: 50.0000 mg | ORAL_TABLET | Freq: Two times a day (BID) | ORAL | 3 refills | Status: DC
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
## 2022-07-13 NOTE — Patient Instructions (Signed)
Medication Instructions:  Your physician has recommended you make the following change in your medication:  1.) change losartan to 50 mg twice a day.  We sent a new prescription for 50 mg tablets to the pharmacy.  You can split your 100 mg tablets (already have at home) in HALF.  Take HALF TAB twice a day until you get the new prescription.  Start tomorrow.   *If you need a refill on your cardiac medications before your next appointment, please call your pharmacy*   Lab Work: Today: CBC, BMET If you have labs (blood work) drawn today and your tests are completely normal, you will receive your results only by: [place] (if you have MyChart) OR A paper copy in the mail If you have any lab test that is abnormal or we need to change your treatment, we will call you to review the results.   Testing/Procedures: Your physician has requested that you have an echocardiogram. Echocardiography is a painless test that uses sound waves to create images of your heart. It provides your doctor with information about the size and shape of your heart and how well your heart's chambers and valves are working. This procedure takes approximately one hour. There are no restrictions for this procedure.   Follow-Up: As scheduled.   Important Information About Sugar

## 2022-07-14 ENCOUNTER — Encounter: Payer: Self-pay | Admitting: Registered Nurse

## 2022-07-14 ENCOUNTER — Encounter: Payer: No Typology Code available for payment source | Attending: Registered Nurse | Admitting: Registered Nurse

## 2022-07-14 VITALS — Ht 61.0 in | Wt 165.0 lb

## 2022-07-14 DIAGNOSIS — Z79891 Long term (current) use of opiate analgesic: Secondary | ICD-10-CM | POA: Insufficient documentation

## 2022-07-14 DIAGNOSIS — M5416 Radiculopathy, lumbar region: Secondary | ICD-10-CM | POA: Insufficient documentation

## 2022-07-14 DIAGNOSIS — M4722 Other spondylosis with radiculopathy, cervical region: Secondary | ICD-10-CM | POA: Insufficient documentation

## 2022-07-14 DIAGNOSIS — M5412 Radiculopathy, cervical region: Secondary | ICD-10-CM | POA: Insufficient documentation

## 2022-07-14 DIAGNOSIS — M542 Cervicalgia: Secondary | ICD-10-CM | POA: Diagnosis not present

## 2022-07-14 DIAGNOSIS — M546 Pain in thoracic spine: Secondary | ICD-10-CM | POA: Insufficient documentation

## 2022-07-14 DIAGNOSIS — M961 Postlaminectomy syndrome, not elsewhere classified: Secondary | ICD-10-CM | POA: Diagnosis not present

## 2022-07-14 DIAGNOSIS — M7918 Myalgia, other site: Secondary | ICD-10-CM | POA: Insufficient documentation

## 2022-07-14 DIAGNOSIS — Z5181 Encounter for therapeutic drug level monitoring: Secondary | ICD-10-CM | POA: Insufficient documentation

## 2022-07-14 DIAGNOSIS — R202 Paresthesia of skin: Secondary | ICD-10-CM | POA: Insufficient documentation

## 2022-07-14 DIAGNOSIS — G8929 Other chronic pain: Secondary | ICD-10-CM | POA: Insufficient documentation

## 2022-07-14 DIAGNOSIS — G894 Chronic pain syndrome: Secondary | ICD-10-CM | POA: Insufficient documentation

## 2022-07-14 LAB — BASIC METABOLIC PANEL
BUN/Creatinine Ratio: 12 (ref 12–28)
BUN: 19 mg/dL (ref 8–27)
CO2: 23 mmol/L (ref 20–29)
Calcium: 9.4 mg/dL (ref 8.7–10.3)
Chloride: 104 mmol/L (ref 96–106)
Creatinine, Ser: 1.54 mg/dL — ABNORMAL HIGH (ref 0.57–1.00)
Glucose: 89 mg/dL (ref 70–99)
Potassium: 4.9 mmol/L (ref 3.5–5.2)
Sodium: 140 mmol/L (ref 134–144)
eGFR: 38 mL/min/{1.73_m2} — ABNORMAL LOW (ref >59)

## 2022-07-14 LAB — CBC
Hematocrit: 31.5 % — ABNORMAL LOW (ref 34.0–46.6)
Hemoglobin: 10.3 g/dL — ABNORMAL LOW (ref 11.1–15.9)
MCH: 27.2 pg (ref 26.6–33.0)
MCHC: 32.7 g/dL (ref 31.5–35.7)
MCV: 83 fL (ref 79–97)
Platelets: 324 10*3/uL (ref 150–450)
RBC: 3.79 x10E6/uL (ref 3.77–5.28)
RDW: 13.4 % (ref 11.7–15.4)
WBC: 2.6 10*3/uL — ABNORMAL LOW (ref 3.4–10.8)

## 2022-07-14 MED ORDER — HYDROCODONE-ACETAMINOPHEN 7.5-325 MG PO TABS: ORAL_TABLET | ORAL | 0 refills | Status: DC

## 2022-07-14 MED ORDER — PREGABALIN 100 MG PO CAPS: 100.0000 mg | ORAL_CAPSULE | Freq: Three times a day (TID) | ORAL | 3 refills | Status: DC

## 2022-07-14 MED ORDER — POLYETHYLENE GLYCOL 3350 17 GM/SCOOP PO POWD: 17.0000 g | Freq: Every day | ORAL | 3 refills | Status: DC | PRN

## 2022-07-14 MED ORDER — TIZANIDINE HCL 4 MG PO TABS: 4.0000 mg | ORAL_TABLET | Freq: Three times a day (TID) | ORAL | 3 refills | Status: DC

## 2022-07-14 MED ORDER — TRAZODONE HCL 150 MG PO TABS: 150.0000 mg | ORAL_TABLET | Freq: Every day | ORAL | 3 refills | Status: DC

## 2022-07-14 MED ORDER — SENNOSIDES-DOCUSATE SODIUM 8.6-50 MG PO TABS: 1.0000 | ORAL_TABLET | Freq: Every day | ORAL | 5 refills | Status: DC

## 2022-07-14 MED ORDER — LIDOCAINE 5 % EX PTCH: MEDICATED_PATCH | CUTANEOUS | 0 refills | Status: DC
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
## 2022-07-18 ENCOUNTER — Telehealth: Payer: Self-pay | Admitting: Cardiovascular Disease

## 2022-07-18 DIAGNOSIS — D62 Acute posthemorrhagic anemia: Secondary | ICD-10-CM | POA: Diagnosis not present

## 2022-07-18 DIAGNOSIS — I110 Hypertensive heart disease with heart failure: Secondary | ICD-10-CM | POA: Diagnosis not present

## 2022-07-18 DIAGNOSIS — N179 Acute kidney failure, unspecified: Secondary | ICD-10-CM | POA: Diagnosis not present

## 2022-07-18 DIAGNOSIS — Z48812 Encounter for surgical aftercare following surgery on the circulatory system: Secondary | ICD-10-CM | POA: Diagnosis not present

## 2022-07-18 DIAGNOSIS — I5032 Chronic diastolic (congestive) heart failure: Secondary | ICD-10-CM | POA: Diagnosis not present

## 2022-07-18 NOTE — Telephone Encounter (Signed)
Pt c/o BP issue: STAT if pt c/o blurred vision, one-sided weakness or slurred speech  1. What are your last 5 BP readings?  Sitting 122/78  hr80 Standing after 1 minute 92/78  hr92 Standing after 3 minute102/78  hr 90  2. Are you having any other symptoms (ex. Dizziness, headache, blurred vision, passed out)? Ongoing lightheadedness  3. What is your BP issue? Beth from [place] called with bp readings for Dr. Angelena Form.

## 2022-07-18 NOTE — Telephone Encounter (Signed)
Spoke with Beth who is calling with an update on patient's orthostatic vitals. She states that the patient still complains of feeling worse after she has been standing for a while. This is when she seems to get lightheaded and needs to sit down. She states that the patient actually is looking better this week compared to last and is moving around more. I have also clarified the patient's losartan dose with her which was changed to 50 mg twice per day. Beth verbalized understanding.

## 2022-07-19 NOTE — Telephone Encounter (Signed)
Message from Dr. Angelena Form: I would have her continue to push her po intake of water. cdm    Called and let patient know.  No other needs at this time.

## 2022-07-20 ENCOUNTER — Ambulatory Visit (INDEPENDENT_AMBULATORY_CARE_PROVIDER_SITE_OTHER): Payer: Medicare Other | Admitting: Student

## 2022-07-20 ENCOUNTER — Encounter: Payer: Self-pay | Admitting: Student

## 2022-07-20 VITALS — BP 105/62 | HR 77 | Ht 61.0 in | Wt 175.4 lb

## 2022-07-20 DIAGNOSIS — R110 Nausea: Secondary | ICD-10-CM | POA: Insufficient documentation

## 2022-07-20 MED ORDER — PANTOPRAZOLE SODIUM 40 MG PO TBEC: 40.0000 mg | DELAYED_RELEASE_TABLET | Freq: Every day | ORAL | 3 refills | Status: DC

## 2022-07-20 MED ORDER — ONDANSETRON 4 MG PO TBDP: 4.0000 mg | ORAL_TABLET | Freq: Three times a day (TID) | ORAL | 0 refills | Status: AC | PRN

## 2022-07-20 NOTE — Progress Notes (Signed)
  SUBJECTIVE:   CHIEF COMPLAINT / HPI:    Dizziness/Lightheadedness:  Patient contacted cardiology per recent telephone note about ongoing lightheadedness.  At that time, orthostatics were obtained by [facility] assistant and cardiology instructed to continue with increasing water intake  Was last seen by cardiology in office on 07/13/2022.  At that time, they had her continue with losartan 50 mg twice daily and metoprolol 12.5 twice daily.  Patient is post aortic root replacement that was performed in 06/23. Echo performed 06/07/22 showed LV normal systolic function and EF of 60 to 65% with moderate concentric left ventricular hypertrophy.  Moderate aortic valve regurgitation treated with aortic root replacement. Patient will have CT surgery follow up soon as well as cardiology follow up   Reflux and Nausea:  Present since surgery  No vomiting  Worse with eating food  Appetite is slowly returning  No bleeding  Zofran has helped with her nausea before  Using a walker to assist walking around at home    PERTINENT  PMH / PSH: Hypertension, chronic diastolic heart failure, anxiety, aortic insufficiency with aortic root replacement   OBJECTIVE:  BP 105/62   Pulse 77   Ht '5\' 1"'$  (1.549 m)   Wt 175 lb 6 oz (79.5 kg)   LMP 10/22/2014 (Approximate)   SpO2 99%   BMI 33.14 kg/m   General: NAD, pleasant, able to participate in exam; pleasant AAF Cardiac: RRR, no murmurs auscultated Respiratory: CTAB, normal WOB Abdomen: soft, non-tender, non-distended, normoactive bowel sounds Extremities: warm and well perfused, no edema or cyanosis Skin: warm and dry, no rashes noted Psych: Normal affect and mood  ASSESSMENT/PLAN:  Nausea Rx PPI and Zofran for nausea and reflux symptoms  No concern for aortic root repair complications, HDS with normal physical exam findings Monitor symptoms  Recent EKG reviewed without qtc prolongation  Return in 4 weeks if symptoms persist   For dizziness, defer to  cardiology expertise on antihypertensives. Continue with HH therapy and current walker around the house for ambulation.    No orders of the defined types were placed in this encounter.  Meds ordered this encounter  Medications   pantoprazole (PROTONIX) 40 MG tablet    Sig: Take 1 tablet (40 mg total) by mouth daily.    Dispense:  30 tablet    Refill:  3   ondansetron (ZOFRAN-ODT) 4 MG disintegrating tablet    Sig: Take 1 tablet (4 mg total) by mouth every 8 (eight) hours as needed for nausea or vomiting.    Dispense:  20 tablet    Refill:  0   Return in about 1 month (around 08/20/2022), or if symptoms worsen or fail to improve. Erskine Emery, MD 07/20/2022, 4:06 PM PGY-2, [place]

## 2022-07-20 NOTE — Assessment & Plan Note (Addendum)
Rx PPI and Zofran for nausea and reflux symptoms  No concern for aortic root repair complications, HDS with normal physical exam findings Monitor symptoms  Recent EKG reviewed without qtc prolongation  Return in 4 weeks if symptoms persist

## 2022-07-20 NOTE — Patient Instructions (Addendum)
It was great to see you today! Thank you for choosing [facility] for your primary care. Maria Griffith was seen for nausea and reflux.  Today we addressed: Continue with PPI for treatment of your reflux you can take this daily Zofran to help you placed on your tongue and let disintegrate, you can take this as needed every 8 hours Please follow-up in a few weeks if symptoms persist   You can get the Shingrix vaccine at the pharmacy. You will need a 2nd shot 2-6 months after the first. This vaccine is important to help prevent shingles. If you would like this vaccine, please let us know   If you haven't already, sign up for My Chart to have easy access to your labs results, and communication with your primary care physician.  You should return to our clinic Return in about 1 month (around 08/20/2022), or if symptoms worsen or fail to improve.  I recommend that you always bring your medications to each appointment as this makes it easy to ensure you are on the correct medications and helps [place] not miss refills when you need them.  Please arrive 15 minutes before your appointment to ensure smooth check in process.  We appreciate your efforts in making this happen.  Please call the clinic at [phone] if your symptoms worsen or you have any concerns.  Thank you for allowing me to participate in your care, Erskine Emery, MD 07/20/2022, 3:51 PM PGY-2, [place]

## 2022-07-21 ENCOUNTER — Telehealth: Payer: Self-pay | Admitting: Cardiovascular Disease

## 2022-07-21 DIAGNOSIS — D62 Acute posthemorrhagic anemia: Secondary | ICD-10-CM | POA: Diagnosis not present

## 2022-07-21 DIAGNOSIS — I110 Hypertensive heart disease with heart failure: Secondary | ICD-10-CM | POA: Diagnosis not present

## 2022-07-21 DIAGNOSIS — Z48812 Encounter for surgical aftercare following surgery on the circulatory system: Secondary | ICD-10-CM | POA: Diagnosis not present

## 2022-07-21 DIAGNOSIS — I5032 Chronic diastolic (congestive) heart failure: Secondary | ICD-10-CM | POA: Diagnosis not present

## 2022-07-21 DIAGNOSIS — N179 Acute kidney failure, unspecified: Secondary | ICD-10-CM | POA: Diagnosis not present

## 2022-07-21 NOTE — Telephone Encounter (Signed)
Pt c/o BP issue: STAT if pt c/o blurred vision, one-sided weakness or slurred speech  1. What are your last 5 BP readings?   2. Are you having any other symptoms (ex. Dizziness, headache, blurred vision, passed out)?   3. What is your BP issue?   Home health nurse called stating patient has elevated blood pressure reading and she has not taken her BP medicine as yet.

## 2022-07-21 NOTE — Telephone Encounter (Signed)
Spoke with pt and pt's OT who reports pt's BP upon arrival to home was 168/120 and rechecked 10 minutes later after being up moving around is 210/130.  Pt has just taken her morning medications and has also taken an additional '50mg'$  of Losartan and 12.'5mg'$  of Metoprolol due to elevated BP.  Pt's OT reports BP's on 08/01 were 128/78 and 92/78.  Pt denies current HA, SOB, CP, visual changes or confusion.  She is eating a frozen meal provided by her insurance company.   Pt advised she should be following a low Na+ diet and hydrating with water.  Pt should check BP about 2 hours after taking morning medications for most accurate BP reading.  OT states he will recheck before he leaves pt's home today.  Reviewed ED precautions.  Will forward to Dr Angelena Form and his RN for review and further recommendation.  Please contact pt as well as OT re any changes in treatment.

## 2022-07-21 NOTE — Telephone Encounter (Signed)
Attempted phone call to pt and left voicemail message to contact RN at [phone].  Will also send a MyChart message re: Dr Camillia Herter recommendation.    Burnell Blanks, MD - It's hard to suggest that she increase her daily dosage of her anti-hypertensive therapy since she has had so many low blood pressures at home. I would recommend that she follow her BP several times per day over the next week. If the pressures are all high, we may need to add something else. The maximum dose of Losartan daily is 100 mg. Gerald Stabs

## 2022-07-22 ENCOUNTER — Other Ambulatory Visit: Payer: Self-pay | Admitting: Family Medicine

## 2022-07-25 ENCOUNTER — Telehealth: Payer: Self-pay | Admitting: Registered Nurse

## 2022-07-25 NOTE — Telephone Encounter (Signed)
Letter sent to [employer]

## 2022-07-26 ENCOUNTER — Other Ambulatory Visit: Payer: Self-pay | Admitting: Family Medicine

## 2022-07-26 DIAGNOSIS — Z48812 Encounter for surgical aftercare following surgery on the circulatory system: Secondary | ICD-10-CM | POA: Diagnosis not present

## 2022-07-26 DIAGNOSIS — D62 Acute posthemorrhagic anemia: Secondary | ICD-10-CM | POA: Diagnosis not present

## 2022-07-26 DIAGNOSIS — N179 Acute kidney failure, unspecified: Secondary | ICD-10-CM | POA: Diagnosis not present

## 2022-07-26 DIAGNOSIS — I110 Hypertensive heart disease with heart failure: Secondary | ICD-10-CM | POA: Diagnosis not present

## 2022-07-26 DIAGNOSIS — I5032 Chronic diastolic (congestive) heart failure: Secondary | ICD-10-CM | POA: Diagnosis not present

## 2022-07-27 ENCOUNTER — Telehealth: Payer: Self-pay | Admitting: Cardiovascular Disease

## 2022-07-27 DIAGNOSIS — I5032 Chronic diastolic (congestive) heart failure: Secondary | ICD-10-CM | POA: Diagnosis not present

## 2022-07-27 DIAGNOSIS — Z48812 Encounter for surgical aftercare following surgery on the circulatory system: Secondary | ICD-10-CM | POA: Diagnosis not present

## 2022-07-27 DIAGNOSIS — N179 Acute kidney failure, unspecified: Secondary | ICD-10-CM | POA: Diagnosis not present

## 2022-07-27 DIAGNOSIS — D62 Acute posthemorrhagic anemia: Secondary | ICD-10-CM | POA: Diagnosis not present

## 2022-07-27 DIAGNOSIS — I110 Hypertensive heart disease with heart failure: Secondary | ICD-10-CM | POA: Diagnosis not present

## 2022-07-27 NOTE — Telephone Encounter (Signed)
V/O for continued PT home services as requested.  Will be faxing orders for Dr. Camillia Herter signature.

## 2022-07-27 NOTE — Telephone Encounter (Signed)
Patient on losartan 50 mg twice a day per medication list request is for 100 mg once daily  Note from cardiology patient was having dizziness on 7/18 they requested she see her PCP.    On 8/3 [facility] office visit blood pressure was 105/62 Dr Zigmund Daniel said to defer to cardiology for expertise on dizziness with her blood pressure medications   Denied Rx for losartan and asked patient to contact our office   If patient calls please determine what she is currenlty taking and who she will be seeing for her blood pressure control  Thanks  LC

## 2022-07-27 NOTE — Telephone Encounter (Signed)
Calling to request extended Home Health order for 1 time a week for 3 weeks. Please advise

## 2022-07-28 ENCOUNTER — Ambulatory Visit (HOSPITAL_COMMUNITY): Payer: Medicare Other | Attending: Cardiovascular Disease

## 2022-07-31 DIAGNOSIS — I110 Hypertensive heart disease with heart failure: Secondary | ICD-10-CM | POA: Diagnosis not present

## 2022-07-31 DIAGNOSIS — I5032 Chronic diastolic (congestive) heart failure: Secondary | ICD-10-CM | POA: Diagnosis not present

## 2022-07-31 DIAGNOSIS — Z48812 Encounter for surgical aftercare following surgery on the circulatory system: Secondary | ICD-10-CM | POA: Diagnosis not present

## 2022-07-31 DIAGNOSIS — N179 Acute kidney failure, unspecified: Secondary | ICD-10-CM | POA: Diagnosis not present

## 2022-07-31 DIAGNOSIS — D62 Acute posthemorrhagic anemia: Secondary | ICD-10-CM | POA: Diagnosis not present

## 2022-08-01 ENCOUNTER — Telehealth: Payer: Self-pay | Admitting: Cardiovascular Disease

## 2022-08-01 DIAGNOSIS — D62 Acute posthemorrhagic anemia: Secondary | ICD-10-CM | POA: Diagnosis not present

## 2022-08-01 DIAGNOSIS — N179 Acute kidney failure, unspecified: Secondary | ICD-10-CM | POA: Diagnosis not present

## 2022-08-01 DIAGNOSIS — Z48812 Encounter for surgical aftercare following surgery on the circulatory system: Secondary | ICD-10-CM | POA: Diagnosis not present

## 2022-08-01 DIAGNOSIS — I5032 Chronic diastolic (congestive) heart failure: Secondary | ICD-10-CM | POA: Diagnosis not present

## 2022-08-01 DIAGNOSIS — I110 Hypertensive heart disease with heart failure: Secondary | ICD-10-CM | POA: Diagnosis not present

## 2022-08-01 NOTE — Telephone Encounter (Signed)
Pt c/o BP issue: STAT if pt c/o blurred vision, one-sided weakness or slurred speech  1. What are your last 5 BP readings? 160/100 HR 78  2. Are you having any other symptoms (ex. Dizziness, headache, blurred vision, passed out)? Mild headache   3. What is your BP issue?

## 2022-08-01 NOTE — Telephone Encounter (Signed)
Left message for Maria Griffith to call PCP, that Dr. Angelena Form is not in this week.

## 2022-08-02 NOTE — Telephone Encounter (Signed)
Called and left message for patient to call back.

## 2022-08-03 ENCOUNTER — Telehealth: Payer: Self-pay | Admitting: Cardiovascular Disease

## 2022-08-03 DIAGNOSIS — Z48812 Encounter for surgical aftercare following surgery on the circulatory system: Secondary | ICD-10-CM | POA: Diagnosis not present

## 2022-08-03 DIAGNOSIS — N179 Acute kidney failure, unspecified: Secondary | ICD-10-CM | POA: Diagnosis not present

## 2022-08-03 DIAGNOSIS — D62 Acute posthemorrhagic anemia: Secondary | ICD-10-CM | POA: Diagnosis not present

## 2022-08-03 DIAGNOSIS — I5032 Chronic diastolic (congestive) heart failure: Secondary | ICD-10-CM | POA: Diagnosis not present

## 2022-08-03 DIAGNOSIS — I110 Hypertensive heart disease with heart failure: Secondary | ICD-10-CM | POA: Diagnosis not present

## 2022-08-03 MED ORDER — HYDRALAZINE HCL 25 MG PO TABS: 25.0000 mg | ORAL_TABLET | Freq: Three times a day (TID) | ORAL | 3 refills | Status: DC

## 2022-08-03 NOTE — Telephone Encounter (Signed)
See previous telephone encounter.

## 2022-08-03 NOTE — Telephone Encounter (Signed)
Pt c/o BP issue: STAT if pt c/o blurred vision, one-sided weakness or slurred speech  1. What are your last 5 BP readings?   170/118 162/110   2. Are you having any other symptoms (ex. Dizziness, headache, blurred vision, passed out)? Lightheadedness  3. What is your BP issue?   Caller stated patient's BP has been running high.  Caller stated patient is complaining of lightheadedness and now headaches.

## 2022-08-03 NOTE — Telephone Encounter (Signed)
Call from [facility].  Pts BPs still running high when they are in the home.  The patient has not been compliant checking them.  Neither Beth nor the physical therapist have gotten any low readings.  This week she has been 160s-170s-/100-110s.  HRs 70s. C/O mild headache and some chest soreness around incision site.  Reviewed medications. Pt is still on 12.5 mg metoprolol (decreased by surgery) and losartan 50 mg BID.   Her other home BP med is hydralazine and she had previously been on 75 mg morning and afternoon and 100 mg at bedtime.  I reviewed with Dr. Marlou Porch (DOD) and then called back to [facility] nurse w recommendation to restart hydralazine 25 mg TID.  [name] will call the patient and let her know. Adv to have her hold this med for SBP <100.  Call w any symptoms.

## 2022-08-08 ENCOUNTER — Encounter: Payer: Self-pay | Admitting: Thoracic Surgery (Cardiothoracic Vascular Surgery)

## 2022-08-08 ENCOUNTER — Ambulatory Visit (INDEPENDENT_AMBULATORY_CARE_PROVIDER_SITE_OTHER): Payer: Self-pay | Admitting: Thoracic Surgery (Cardiothoracic Vascular Surgery)

## 2022-08-08 VITALS — BP 97/67 | HR 82 | Resp 18 | Ht 61.0 in | Wt 180.0 lb

## 2022-08-08 DIAGNOSIS — Q2543 Congenital aneurysm of aorta: Secondary | ICD-10-CM

## 2022-08-08 DIAGNOSIS — Z9889 Other specified postprocedural states: Secondary | ICD-10-CM

## 2022-08-08 DIAGNOSIS — Q2549 Other congenital malformations of aorta: Secondary | ICD-10-CM

## 2022-08-08 DIAGNOSIS — I351 Nonrheumatic aortic (valve) insufficiency: Secondary | ICD-10-CM

## 2022-08-08 DIAGNOSIS — I77819 Aortic ectasia, unspecified site: Secondary | ICD-10-CM

## 2022-08-08 DIAGNOSIS — Z8679 Personal history of other diseases of the circulatory system: Secondary | ICD-10-CM
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
## 2022-08-09 ENCOUNTER — Telehealth: Payer: Self-pay | Admitting: Cardiovascular Disease

## 2022-08-09 DIAGNOSIS — I110 Hypertensive heart disease with heart failure: Secondary | ICD-10-CM | POA: Diagnosis not present

## 2022-08-09 DIAGNOSIS — D62 Acute posthemorrhagic anemia: Secondary | ICD-10-CM | POA: Diagnosis not present

## 2022-08-09 DIAGNOSIS — Z48812 Encounter for surgical aftercare following surgery on the circulatory system: Secondary | ICD-10-CM | POA: Diagnosis not present

## 2022-08-09 DIAGNOSIS — I5032 Chronic diastolic (congestive) heart failure: Secondary | ICD-10-CM | POA: Diagnosis not present

## 2022-08-09 DIAGNOSIS — N179 Acute kidney failure, unspecified: Secondary | ICD-10-CM | POA: Diagnosis not present

## 2022-08-09 NOTE — Telephone Encounter (Signed)
Last week hydralazine was restarted at 25 mg TID.  Her previous home dose was '75mg'$  am, pm and 100 mg at bedtime. Will route this to Dr. Angelena Form to see if he would like to increase hydralazine based on readings below, or has other recommendations.

## 2022-08-09 NOTE — Telephone Encounter (Signed)
Pt c/o BP issue: STAT if pt c/o blurred vision, one-sided weakness or slurred speech  1. What are your last 5 BP readings?  171/109 - Today before meds 146/98 - Today after meds 173/116 - 8/22  2. Are you having any other symptoms (ex. Dizziness, headache, blurred vision, passed out)? No  3. What is your BP issue? Pt's nurse states that pt's BP continues to run high. Please advise

## 2022-08-10 ENCOUNTER — Other Ambulatory Visit (HOSPITAL_COMMUNITY): Payer: Medicare Other

## 2022-08-10 MED ORDER — HYDRALAZINE HCL 50 MG PO TABS: ORAL_TABLET | ORAL | 11 refills | Status: DC

## 2022-08-10 MED ORDER — HYDRALAZINE HCL 50 MG PO TABS: ORAL_TABLET | ORAL | 3 refills | Status: DC

## 2022-08-10 NOTE — Telephone Encounter (Signed)
Reply/recommendation from Dr. Angelena Form: Let's increase her dose back to the previous dose as outlined below. [employer] and left a detailed message on HH Nurse's VM w recommendation to have patient increase hydralazine to 75 mg am, 75 mg afternoon and 100 mg at bedtime.  Asked that she call back to let me know she received the message and communicates this to the patient.

## 2022-08-10 NOTE — Telephone Encounter (Signed)
Pt's nurse from [place] is returning call. Relayed message from [place], RN to [place], [place].

## 2022-08-10 NOTE — Telephone Encounter (Signed)
Beth called back and confirmed she received message to increase hydralazine and will let Ms.Nason know.  Medication list updated.

## 2022-08-14 ENCOUNTER — Ambulatory Visit (HOSPITAL_COMMUNITY): Payer: Medicare Other

## 2022-08-23 ENCOUNTER — Other Ambulatory Visit: Payer: Self-pay | Admitting: Physician Assistant

## 2022-08-29 ENCOUNTER — Ambulatory Visit (HOSPITAL_COMMUNITY): Payer: Medicare Other | Attending: Cardiovascular Disease

## 2022-08-29 DIAGNOSIS — I5032 Chronic diastolic (congestive) heart failure: Secondary | ICD-10-CM

## 2022-08-29 DIAGNOSIS — R42 Dizziness and giddiness: Secondary | ICD-10-CM | POA: Insufficient documentation

## 2022-08-29 DIAGNOSIS — Z8679 Personal history of other diseases of the circulatory system: Secondary | ICD-10-CM

## 2022-08-29 DIAGNOSIS — I2510 Atherosclerotic heart disease of native coronary artery without angina pectoris: Secondary | ICD-10-CM

## 2022-08-29 DIAGNOSIS — Z9889 Other specified postprocedural states: Secondary | ICD-10-CM | POA: Diagnosis not present

## 2022-08-29 DIAGNOSIS — I10 Essential (primary) hypertension: Secondary | ICD-10-CM

## 2022-08-29 LAB — ECHOCARDIOGRAM COMPLETE
AR max vel: 2.73 cm2
AV Area VTI: 2.56 cm2
AV Area mean vel: 2.44 cm2
AV Mean grad: 3 mmHg
AV Peak grad: 6.4 mmHg
Ao pk vel: 1.27 m/s
Area-P 1/2: 4.23 cm2
P 1/2 time: 818 msec
S' Lateral: 2.6 cm

## 2022-09-07 ENCOUNTER — Telehealth: Payer: Self-pay

## 2022-09-07 NOTE — Telephone Encounter (Signed)
Patient called and stated she needs a prescription for heat wraps to go through her worker's comp. Please advise

## 2022-09-08 ENCOUNTER — Ambulatory Visit (INDEPENDENT_AMBULATORY_CARE_PROVIDER_SITE_OTHER): Payer: Medicare Other | Admitting: Family Medicine

## 2022-09-08 ENCOUNTER — Encounter: Payer: Self-pay | Admitting: Family Medicine

## 2022-09-08 VITALS — BP 178/102 | HR 67 | Ht 61.0 in | Wt 186.4 lb

## 2022-09-08 DIAGNOSIS — I10 Essential (primary) hypertension: Secondary | ICD-10-CM | POA: Diagnosis not present

## 2022-09-08 DIAGNOSIS — R0981 Nasal congestion: Secondary | ICD-10-CM | POA: Diagnosis not present

## 2022-09-08 DIAGNOSIS — J069 Acute upper respiratory infection, unspecified: Secondary | ICD-10-CM

## 2022-09-08 NOTE — Patient Instructions (Signed)
Good to see you today - Thank you for coming in!  Things we discussed today:  1) Your congestion and sneezing are due to a virus infection of your sinuses. - Continue to use Flonase 2 sprays in each nostril daily - Use Tylenol as needed for pain or fever - You can expect your symptoms to improve in the next 5 days - Taking warm steamy showers can help moisturize your sinuses and help with breathing  2) Your blood pressure is high today in clinic. Please come back in 2-4weeks to see Dr Gwendlyn Deutscher to adjust your blood pressure meds.  Please always bring your medication bottles

## 2022-09-08 NOTE — Progress Notes (Unsigned)
    SUBJECTIVE:   CHIEF COMPLAINT / HPI: URI sx's  TD is a 61yo F p/w 2 day hx of congestion. She reports stuffy nose, sneezing, generalized muscle aches (especially in legs), raspy voice. She feels that food tastes less good, but still is able to taste things. She is up to date on COVID vaccine. She denies cough, sore throat, or fever. She has tried claritin, flonase, and vicks vapor rub without improvement. PT has not needed her albuterol inhaler more often. Her son smokes cigs at home, usually outside.  PERTINENT  PMH / PSH: HFpEF, Moderate Persistent Asthma, HTN, Restrictive lung dz  OBJECTIVE:   BP (!) 178/102   Pulse 67   Ht '5\' 1"'$  (1.549 m)   Wt 186 lb 6.4 oz (84.6 kg)   LMP 10/22/2014 (Approximate)   SpO2 100%   BMI 35.22 kg/m   Gen: Uncomfortable appearing woman. Friendly. NAD. HEENT: Oropharynx mildly red. No lymphadenopathy palapated. MMM. Resp: Raspy voice. CTAB, no wheezing or crackles. Normal WOB on RA. CV: RRR  ASSESSMENT/PLAN:   Viral URI Afebrile. Normal WOB on RA. No lung involvement on exam.  - Cont Flonase  - Tylenol prn for pain and fever - COVID negative  Essential hypertension Elevated to 178/102 (on recheck).  - Plan to followup with PCP Dr Gwendlyn Deutscher in 2-4wks    Arlyce Dice, MD [place]

## 2022-09-09 LAB — NOVEL CORONAVIRUS, NAA: SARS-CoV-2, NAA: NOT DETECTED

## 2022-09-10 ENCOUNTER — Telehealth: Payer: Self-pay | Admitting: Family Medicine

## 2022-09-10 NOTE — Assessment & Plan Note (Signed)
Elevated to 178/102 (on recheck).  - Plan to followup with PCP Dr Gwendlyn Deutscher in 2-4wks

## 2022-09-10 NOTE — Telephone Encounter (Signed)
Attempted to call pt and left a voicemail. If pt calls back, please let her know that her COVID test is negative.

## 2022-09-10 NOTE — Assessment & Plan Note (Signed)
Afebrile. Normal WOB on RA. No lung involvement on exam.  - Cont Flonase  - Tylenol prn for pain and fever - COVID negative

## 2022-09-11 ENCOUNTER — Telehealth: Payer: Self-pay | Admitting: Registered Nurse

## 2022-09-11 NOTE — Telephone Encounter (Signed)
Patient not able to make her appt on Tuesday 9/26.  She had a Covid test done over weekend and hasn't got results back.  Wants to know what she needs to do about getting another appt and her medications.  I did tell patient to call us with her test results.

## 2022-09-11 NOTE — Telephone Encounter (Signed)
Return [name] call,  No answer, left message to return the call.

## 2022-09-12 ENCOUNTER — Encounter: Payer: Medicare Other | Admitting: Registered Nurse

## 2022-09-12 ENCOUNTER — Telehealth: Payer: Self-pay

## 2022-09-12 MED ORDER — HYDROCODONE-ACETAMINOPHEN 7.5-325 MG PO TABS: ORAL_TABLET | ORAL | 0 refills | Status: DC

## 2022-09-12 NOTE — Telephone Encounter (Signed)
PMP was Reviewed.  Hydrocodone e-scribed today. Maria Griffith awaiting on her COVID Results, her appointment was re-scheduled/ She verbalizes understanding.

## 2022-09-12 NOTE — Telephone Encounter (Signed)
Send medication to pharmacy. Patient awaiting Covid results

## 2022-09-19 ENCOUNTER — Encounter: Payer: Self-pay | Admitting: Family Medicine

## 2022-09-19 ENCOUNTER — Ambulatory Visit (HOSPITAL_BASED_OUTPATIENT_CLINIC_OR_DEPARTMENT_OTHER): Payer: Medicare Other | Admitting: Family Medicine

## 2022-09-19 VITALS — BP 106/73 | HR 69 | Ht 61.0 in | Wt 179.2 lb

## 2022-09-19 DIAGNOSIS — I10 Essential (primary) hypertension: Secondary | ICD-10-CM

## 2022-09-19 DIAGNOSIS — Z23 Encounter for immunization: Secondary | ICD-10-CM

## 2022-09-19 DIAGNOSIS — N1832 Chronic kidney disease, stage 3b: Secondary | ICD-10-CM

## 2022-09-19 DIAGNOSIS — R252 Cramp and spasm: Secondary | ICD-10-CM

## 2022-09-19 DIAGNOSIS — R7303 Prediabetes: Secondary | ICD-10-CM

## 2022-09-19 DIAGNOSIS — F331 Major depressive disorder, recurrent, moderate: Secondary | ICD-10-CM

## 2022-09-19 DIAGNOSIS — R5383 Other fatigue: Secondary | ICD-10-CM | POA: Diagnosis not present

## 2022-09-19 DIAGNOSIS — D649 Anemia, unspecified: Secondary | ICD-10-CM

## 2022-09-19 DIAGNOSIS — Z79899 Other long term (current) drug therapy: Secondary | ICD-10-CM

## 2022-09-19 DIAGNOSIS — L989 Disorder of the skin and subcutaneous tissue, unspecified: Secondary | ICD-10-CM

## 2022-09-19 LAB — POCT GLYCOSYLATED HEMOGLOBIN (HGB A1C): Hemoglobin A1C: 5.5 % (ref 4.0–5.6)

## 2022-09-19 MED ORDER — NYSTATIN 100000 UNIT/GM EX OINT: 1.0000 | TOPICAL_OINTMENT | Freq: Two times a day (BID) | CUTANEOUS | 0 refills | Status: DC

## 2022-09-19 NOTE — Progress Notes (Unsigned)
    SUBJECTIVE:   CHIEF COMPLAINT / HPI:   HTN/Kidy:  Fatigue: appetite, no exerce  Muscle cramps: mustard did not help  Spots on skin:  Vulva itching  PERTINENT  PMH / PSH: ***  OBJECTIVE:   BP 98/67   Pulse 69   Ht '5\' 1"'$  (1.549 m)   Wt 179 lb 3.2 oz (81.3 kg)   LMP 10/22/2014 (Approximate)   SpO2 100%   BMI 33.86 kg/m   ***  ASSESSMENT/PLAN:   No problem-specific Assessment & Plan notes found for this encounter.     Andrena Mews, MD [place]

## 2022-09-19 NOTE — Patient Instructions (Signed)

## 2022-09-20 ENCOUNTER — Telehealth: Payer: Self-pay | Admitting: Registered Nurse

## 2022-09-20 ENCOUNTER — Other Ambulatory Visit: Payer: Self-pay | Admitting: Physician Assistant

## 2022-09-20 ENCOUNTER — Other Ambulatory Visit: Payer: Self-pay | Admitting: Family Medicine

## 2022-09-20 DIAGNOSIS — N183 Chronic kidney disease, stage 3 unspecified: Secondary | ICD-10-CM | POA: Insufficient documentation

## 2022-09-20 DIAGNOSIS — Z1231 Encounter for screening mammogram for malignant neoplasm of breast: Secondary | ICD-10-CM

## 2022-09-20 DIAGNOSIS — Z79899 Other long term (current) drug therapy: Secondary | ICD-10-CM | POA: Insufficient documentation

## 2022-09-20 NOTE — Assessment & Plan Note (Signed)
PHQ9 chronically elevated. Not suicidal. Continue Trazadon. F/U Psych. [place] Office Visit from 09/19/2022 in [place]  PHQ-9 Total Score 15    \

## 2022-09-20 NOTE — Assessment & Plan Note (Signed)
Likely hypertensive renal disease. Cmet checked. I will contact her with result. Consider Nephrology referral at some point.

## 2022-09-20 NOTE — Assessment & Plan Note (Signed)
She lost few pounds since last visit. Likely due to poor oral intake. She is up to date with all cancer screening. Increase intake recommended. Monitor closely for now.

## 2022-09-20 NOTE — Assessment & Plan Note (Signed)
She had extensive work up in the past. Hydration and use of muscle relaxant discussed. Monitor closely for now.

## 2022-09-20 NOTE — Assessment & Plan Note (Signed)
Poly pharmacy discussed during this visit. She was advised to schedule pharmacy appointment for medication review and adjustment. She agreed with the plan.

## 2022-09-20 NOTE — Assessment & Plan Note (Signed)
BP looks good today. I discussed transitioning to Losartan 100 mg QD. She prefers to continue at 50 mg BID per her cardiologist. She also prefer BP management by her cardiologist which is reasonable.

## 2022-09-20 NOTE — Assessment & Plan Note (Signed)
Likely chronic fatigue syndrome. May also be related to polypharmacy. CBC checked to assess for anemia. TSH checked. Graded exercise discussed. I will contact her soon with results.

## 2022-09-20 NOTE — Telephone Encounter (Signed)
Return Maria Griffith call,  She states she is using her Cervical and Lumbar heat wraps. She will also need prescriptions and letter to be sent to [facility] Compensation for Tens Unit, Lead Wires and Tens Electrodes to be sent.  This will be sent to [employer].  She verbalizes understanding.

## 2022-09-21 ENCOUNTER — Telehealth (HOSPITAL_COMMUNITY): Payer: Self-pay

## 2022-09-21 ENCOUNTER — Encounter (HOSPITAL_COMMUNITY): Payer: Self-pay

## 2022-09-21 ENCOUNTER — Encounter: Payer: Self-pay | Admitting: Family Medicine

## 2022-09-21 ENCOUNTER — Telehealth: Payer: Self-pay | Admitting: Family Medicine

## 2022-09-21 LAB — ANEMIA PROFILE B
Basophils Absolute: 0 10*3/uL (ref 0.0–0.2)
Basos: 1 %
EOS (ABSOLUTE): 0.4 10*3/uL (ref 0.0–0.4)
Eos: 15 %
Ferritin: 91 ng/mL (ref 15–150)
Folate: 14.3 ng/mL (ref >3.0)
Hematocrit: 33.4 % — ABNORMAL LOW (ref 34.0–46.6)
Hemoglobin: 11.1 g/dL (ref 11.1–15.9)
Immature Grans (Abs): 0 10*3/uL (ref 0.0–0.1)
Immature Granulocytes: 0 %
Iron Saturation: 23 % (ref 15–55)
Iron: 63 ug/dL (ref 27–159)
Lymphocytes Absolute: 1.1 10*3/uL (ref 0.7–3.1)
Lymphs: 46 %
MCH: 27.1 pg (ref 26.6–33.0)
MCHC: 33.2 g/dL (ref 31.5–35.7)
MCV: 82 fL (ref 79–97)
Monocytes Absolute: 0.3 10*3/uL (ref 0.1–0.9)
Monocytes: 10 %
Neutrophils Absolute: 0.7 10*3/uL — ABNORMAL LOW (ref 1.4–7.0)
Neutrophils: 28 %
Platelets: 265 10*3/uL (ref 150–450)
RBC: 4.1 x10E6/uL (ref 3.77–5.28)
RDW: 14.8 % (ref 11.7–15.4)
Retic Ct Pct: 0.8 % (ref 0.6–2.6)
Total Iron Binding Capacity: 269 ug/dL (ref 250–450)
UIBC: 206 ug/dL (ref 131–425)
Vitamin B-12: 542 pg/mL (ref 232–1245)
WBC: 2.5 10*3/uL — CL (ref 3.4–10.8)

## 2022-09-21 LAB — CMP14+EGFR
ALT: 14 IU/L (ref 0–32)
AST: 20 IU/L (ref 0–40)
Albumin/Globulin Ratio: 1.3 (ref 1.2–2.2)
Albumin: 4.3 g/dL (ref 3.8–4.9)
Alkaline Phosphatase: 76 IU/L (ref 44–121)
BUN/Creatinine Ratio: 18 (ref 12–28)
BUN: 27 mg/dL (ref 8–27)
Bilirubin Total: 0.3 mg/dL (ref 0.0–1.2)
CO2: 24 mmol/L (ref 20–29)
Calcium: 9.3 mg/dL (ref 8.7–10.3)
Chloride: 101 mmol/L (ref 96–106)
Creatinine, Ser: 1.46 mg/dL — ABNORMAL HIGH (ref 0.57–1.00)
Globulin, Total: 3.2 g/dL (ref 1.5–4.5)
Glucose: 71 mg/dL (ref 70–99)
Potassium: 4.6 mmol/L (ref 3.5–5.2)
Sodium: 139 mmol/L (ref 134–144)
Total Protein: 7.5 g/dL (ref 6.0–8.5)
eGFR: 41 mL/min/{1.73_m2} — ABNORMAL LOW (ref >59)

## 2022-09-21 LAB — MICROALBUMIN / CREATININE URINE RATIO
Creatinine, Urine: 283.5 mg/dL
Microalb/Creat Ratio: 35 mg/g creat — ABNORMAL HIGH (ref 0–29)
Microalbumin, Urine: 98.3 ug/mL

## 2022-09-21 LAB — TSH: TSH: 1.06 u[IU]/mL (ref 0.450–4.500)

## 2022-09-21 NOTE — Telephone Encounter (Signed)
Test result discussed. Chronic leukopenia - She was evaluated by hematologist in the past. I again advised f/u with pain specialist for Lyrica adjustment.  Kidney funtion improved. Urine microalbumin abnormal. I discussed optimizing BP control. Repeat in 3-6 months. If worsening, we will consider referral to nephrologist. She agreed with the plan.

## 2022-09-21 NOTE — Telephone Encounter (Signed)
Attempted to call patient in regards to Cardiac Rehab - LM on VM Mailed letter 

## 2022-09-26 ENCOUNTER — Other Ambulatory Visit: Payer: Self-pay

## 2022-09-26 MED ORDER — METOPROLOL TARTRATE 25 MG PO TABS: 25.0000 mg | ORAL_TABLET | Freq: Two times a day (BID) | ORAL | 2 refills | Status: DC

## 2022-09-26 NOTE — Telephone Encounter (Signed)
Pt's medication was sent to pt's pharmacy as requested. Confirmation received.  °

## 2022-09-27 ENCOUNTER — Telehealth: Payer: Self-pay

## 2022-09-27 NOTE — Telephone Encounter (Signed)
Prior Authorization for Hydrocodone 7.5-325 MG faxed to Ms. Norman Herrlich. Fax  # [phone].

## 2022-09-28 ENCOUNTER — Ambulatory Visit: Payer: Medicare Other | Admitting: Pharmacist

## 2022-10-02 ENCOUNTER — Ambulatory Visit: Payer: Medicare Other | Admitting: Registered Nurse

## 2022-10-09 ENCOUNTER — Encounter: Payer: Self-pay | Admitting: Registered Nurse

## 2022-10-09 ENCOUNTER — Other Ambulatory Visit: Payer: Self-pay | Admitting: Student

## 2022-10-09 ENCOUNTER — Encounter: Payer: Medicare Other | Attending: Registered Nurse | Admitting: Registered Nurse

## 2022-10-09 VITALS — BP 108/67 | HR 68 | Ht 61.0 in | Wt 178.0 lb

## 2022-10-09 DIAGNOSIS — M546 Pain in thoracic spine: Secondary | ICD-10-CM | POA: Diagnosis not present

## 2022-10-09 DIAGNOSIS — Z5181 Encounter for therapeutic drug level monitoring: Secondary | ICD-10-CM | POA: Insufficient documentation

## 2022-10-09 DIAGNOSIS — G894 Chronic pain syndrome: Secondary | ICD-10-CM | POA: Insufficient documentation

## 2022-10-09 DIAGNOSIS — M961 Postlaminectomy syndrome, not elsewhere classified: Secondary | ICD-10-CM | POA: Insufficient documentation

## 2022-10-09 DIAGNOSIS — M542 Cervicalgia: Secondary | ICD-10-CM | POA: Insufficient documentation

## 2022-10-09 DIAGNOSIS — M5412 Radiculopathy, cervical region: Secondary | ICD-10-CM | POA: Diagnosis not present

## 2022-10-09 DIAGNOSIS — M7918 Myalgia, other site: Secondary | ICD-10-CM | POA: Insufficient documentation

## 2022-10-09 DIAGNOSIS — M5416 Radiculopathy, lumbar region: Secondary | ICD-10-CM | POA: Insufficient documentation

## 2022-10-09 DIAGNOSIS — G8929 Other chronic pain: Secondary | ICD-10-CM | POA: Insufficient documentation

## 2022-10-09 DIAGNOSIS — Z79891 Long term (current) use of opiate analgesic: Secondary | ICD-10-CM | POA: Insufficient documentation

## 2022-10-09 DIAGNOSIS — M4722 Other spondylosis with radiculopathy, cervical region: Secondary | ICD-10-CM | POA: Insufficient documentation

## 2022-10-09 DIAGNOSIS — R202 Paresthesia of skin: Secondary | ICD-10-CM | POA: Diagnosis not present

## 2022-10-09 DIAGNOSIS — R110 Nausea: Secondary | ICD-10-CM

## 2022-10-09 MED ORDER — PREGABALIN 100 MG PO CAPS: 100.0000 mg | ORAL_CAPSULE | Freq: Three times a day (TID) | ORAL | 3 refills | Status: DC

## 2022-10-09 MED ORDER — LIDOCAINE 5 % EX PTCH: MEDICATED_PATCH | CUTANEOUS | 1 refills | Status: DC

## 2022-10-09 MED ORDER — HYDROCODONE-ACETAMINOPHEN 7.5-325 MG PO TABS: ORAL_TABLET | ORAL | 0 refills | Status: DC

## 2022-10-09 MED ORDER — TRAZODONE HCL 150 MG PO TABS: 150.0000 mg | ORAL_TABLET | Freq: Every day | ORAL | 3 refills | Status: DC

## 2022-10-09 MED ORDER — TIZANIDINE HCL 4 MG PO TABS: 4.0000 mg | ORAL_TABLET | Freq: Three times a day (TID) | ORAL | 3 refills | Status: DC
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
## 2022-10-09 NOTE — Telephone Encounter (Signed)
No Indication for PPI. She also has CKD.

## 2022-10-12 LAB — DRUG TOX MONITOR 1 W/CONF, ORAL FLD
Amphetamines: NEGATIVE ng/mL (ref <10)
Barbiturates: NEGATIVE ng/mL (ref <10)
Benzodiazepines: NEGATIVE ng/mL (ref <0.50)
Buprenorphine: NEGATIVE ng/mL (ref <0.10)
Cocaine: NEGATIVE ng/mL (ref <5.0)
Codeine: NEGATIVE ng/mL (ref <2.5)
Dihydrocodeine: NEGATIVE ng/mL (ref <2.5)
Fentanyl: NEGATIVE ng/mL (ref <0.10)
Heroin Metabolite: NEGATIVE ng/mL (ref <1.0)
Hydrocodone: 35 ng/mL — ABNORMAL HIGH (ref <2.5)
Hydromorphone: NEGATIVE ng/mL (ref <2.5)
MARIJUANA: NEGATIVE ng/mL (ref <2.5)
MDMA: NEGATIVE ng/mL (ref <10)
Meprobamate: NEGATIVE ng/mL (ref <2.5)
Methadone: NEGATIVE ng/mL (ref <5.0)
Morphine: NEGATIVE ng/mL (ref <2.5)
Nicotine Metabolite: NEGATIVE ng/mL (ref <5.0)
Norhydrocodone: NEGATIVE ng/mL (ref <2.5)
Noroxycodone: NEGATIVE ng/mL (ref <2.5)
Opiates: POSITIVE ng/mL — AB (ref <2.5)
Oxycodone: NEGATIVE ng/mL (ref <2.5)
Oxymorphone: NEGATIVE ng/mL (ref <2.5)
Phencyclidine: NEGATIVE ng/mL (ref <10)
Tapentadol: NEGATIVE ng/mL (ref <5.0)
Tramadol: NEGATIVE ng/mL (ref <5.0)
Zolpidem: NEGATIVE ng/mL (ref <5.0)

## 2022-10-12 LAB — DRUG TOX ALC METAB W/CON, ORAL FLD: Alcohol Metabolite: NEGATIVE ng/mL (ref <25)

## 2022-10-18 ENCOUNTER — Telehealth: Payer: Self-pay | Admitting: *Deleted

## 2022-10-18 NOTE — Telephone Encounter (Signed)
Urine drug screen for this encounter is consistent for prescribed medication 

## 2022-10-19 ENCOUNTER — Emergency Department (HOSPITAL_COMMUNITY)
Admission: EM | Admit: 2022-10-19 | Discharge: 2022-10-19 | Disposition: A | Discharge status: Home / Self Care | Payer: Medicare Other | Attending: Emergency Medicine | Admitting: Emergency Medicine

## 2022-10-19 ENCOUNTER — Encounter (HOSPITAL_COMMUNITY): Payer: Self-pay

## 2022-10-19 ENCOUNTER — Emergency Department (HOSPITAL_COMMUNITY): Payer: Medicare Other

## 2022-10-19 DIAGNOSIS — Z743 Need for continuous supervision: Secondary | ICD-10-CM | POA: Diagnosis not present

## 2022-10-19 DIAGNOSIS — R531 Weakness: Secondary | ICD-10-CM | POA: Diagnosis not present

## 2022-10-19 DIAGNOSIS — J189 Pneumonia, unspecified organism: Secondary | ICD-10-CM

## 2022-10-19 DIAGNOSIS — Z79899 Other long term (current) drug therapy: Secondary | ICD-10-CM | POA: Diagnosis not present

## 2022-10-19 DIAGNOSIS — Z20822 Contact with and (suspected) exposure to covid-19: Secondary | ICD-10-CM | POA: Diagnosis not present

## 2022-10-19 DIAGNOSIS — R6889 Other general symptoms and signs: Secondary | ICD-10-CM | POA: Diagnosis not present

## 2022-10-19 DIAGNOSIS — G4489 Other headache syndrome: Secondary | ICD-10-CM | POA: Diagnosis not present

## 2022-10-19 DIAGNOSIS — I160 Hypertensive urgency: Secondary | ICD-10-CM | POA: Insufficient documentation

## 2022-10-19 DIAGNOSIS — J181 Lobar pneumonia, unspecified organism: Secondary | ICD-10-CM | POA: Insufficient documentation

## 2022-10-19 DIAGNOSIS — I10 Essential (primary) hypertension: Secondary | ICD-10-CM | POA: Diagnosis not present

## 2022-10-19 DIAGNOSIS — R509 Fever, unspecified: Secondary | ICD-10-CM | POA: Diagnosis not present

## 2022-10-19 LAB — COMPREHENSIVE METABOLIC PANEL
ALT: 17 U/L (ref 0–44)
AST: 25 U/L (ref 15–41)
Albumin: 4.2 g/dL (ref 3.5–5.0)
Alkaline Phosphatase: 54 U/L (ref 38–126)
Anion gap: 8 (ref 5–15)
BUN: 19 mg/dL (ref 6–20)
CO2: 22 mmol/L (ref 22–32)
Calcium: 8.9 mg/dL (ref 8.9–10.3)
Chloride: 106 mmol/L (ref 98–111)
Creatinine, Ser: 1.32 mg/dL — ABNORMAL HIGH (ref 0.44–1.00)
GFR, Estimated: 46 mL/min — ABNORMAL LOW (ref >60)
Glucose, Bld: 109 mg/dL — ABNORMAL HIGH (ref 70–99)
Potassium: 3.5 mmol/L (ref 3.5–5.1)
Sodium: 136 mmol/L (ref 135–145)
Total Bilirubin: 1.1 mg/dL (ref 0.3–1.2)
Total Protein: 8 g/dL (ref 6.5–8.1)

## 2022-10-19 LAB — CBC
HCT: 38 % (ref 36.0–46.0)
Hemoglobin: 12.7 g/dL (ref 12.0–15.0)
MCH: 28.3 pg (ref 26.0–34.0)
MCHC: 33.4 g/dL (ref 30.0–36.0)
MCV: 84.8 fL (ref 80.0–100.0)
Platelets: 200 10*3/uL (ref 150–400)
RBC: 4.48 MIL/uL (ref 3.87–5.11)
RDW: 15.6 % — ABNORMAL HIGH (ref 11.5–15.5)
WBC: 11.2 10*3/uL — ABNORMAL HIGH (ref 4.0–10.5)
nRBC: 0 % (ref 0.0–0.2)

## 2022-10-19 LAB — RESP PANEL BY RT-PCR (FLU A&B, COVID) ARPGX2
Influenza A by PCR: NEGATIVE
Influenza B by PCR: NEGATIVE
SARS Coronavirus 2 by RT PCR: NEGATIVE

## 2022-10-19 MED ORDER — KETOROLAC TROMETHAMINE 15 MG/ML IJ SOLN
15.0000 mg | Freq: Once | INTRAMUSCULAR | Status: AC
  Administered 2022-10-19: 15 mg via INTRAVENOUS
  Filled 2022-10-19: qty 1

## 2022-10-19 MED ORDER — METOPROLOL TARTRATE 25 MG PO TABS
25.0000 mg | ORAL_TABLET | Freq: Once | ORAL | Status: AC
  Administered 2022-10-19: 25 mg via ORAL
  Filled 2022-10-19: qty 1

## 2022-10-19 MED ORDER — DOXYCYCLINE HYCLATE 100 MG PO TABS
100.0000 mg | ORAL_TABLET | Freq: Once | ORAL | Status: AC
  Administered 2022-10-19: 100 mg via ORAL
  Filled 2022-10-19: qty 1

## 2022-10-19 MED ORDER — ACETAMINOPHEN 500 MG PO TABS
1000.0000 mg | ORAL_TABLET | Freq: Once | ORAL | Status: AC
  Administered 2022-10-19: 1000 mg via ORAL
  Filled 2022-10-19: qty 2

## 2022-10-19 MED ORDER — HYDRALAZINE HCL 25 MG PO TABS
75.0000 mg | ORAL_TABLET | Freq: Once | ORAL | Status: AC
  Administered 2022-10-19: 75 mg via ORAL
  Filled 2022-10-19: qty 3

## 2022-10-19 MED ORDER — SODIUM CHLORIDE 0.9 % IV BOLUS
1000.0000 mL | Freq: Once | INTRAVENOUS | Status: AC
  Administered 2022-10-19: 1000 mL via INTRAVENOUS

## 2022-10-19 MED ORDER — DROPERIDOL 2.5 MG/ML IJ SOLN
2.5000 mg | Freq: Once | INTRAMUSCULAR | Status: AC
  Administered 2022-10-19: 2.5 mg via INTRAVENOUS
  Filled 2022-10-19: qty 2

## 2022-10-19 MED ORDER — LOSARTAN POTASSIUM 25 MG PO TABS
50.0000 mg | ORAL_TABLET | Freq: Once | ORAL | Status: AC
  Administered 2022-10-19: 50 mg via ORAL
  Filled 2022-10-19: qty 2

## 2022-10-19 MED ORDER — AMOXICILLIN-POT CLAVULANATE 875-125 MG PO TABS: 1.0000 | ORAL_TABLET | Freq: Two times a day (BID) | ORAL | 0 refills | Status: DC

## 2022-10-19 MED ORDER — DOXYCYCLINE HYCLATE 100 MG PO CAPS: 100.0000 mg | ORAL_CAPSULE | Freq: Two times a day (BID) | ORAL | 0 refills | Status: DC

## 2022-10-19 MED ORDER — AMOXICILLIN-POT CLAVULANATE 875-125 MG PO TABS
1.0000 | ORAL_TABLET | Freq: Once | ORAL | Status: AC
  Administered 2022-10-19: 1 via ORAL
  Filled 2022-10-19: qty 1

## 2022-10-19 NOTE — ED Triage Notes (Signed)
Pt BIBA from home for headache and weakness x2 days, worse today. Hx HTN, has not taken meds in a few days. Some nausea, denies vomiting or diarrhea. Denies [facility], denies CP with EMS. Valve replacement in June.  218/109 SpO2 96% CBG 146

## 2022-10-19 NOTE — Discharge Instructions (Addendum)
Your testing today shows that you have pneumonia.  You are being given 2 different antibiotics to help treat this.  Be sure to take your antibiotics as instructed.  You also have a high fever and you should alternate ibuprofen and Tylenol throughout the day to help with both headache and fever.  Be sure that you are taking your blood pressure medicines as prescribed as your blood pressure was very elevated today.  This is also probably contributing to your headache.  You were given your first dose of antibiotics in the Emergency Department. Take your next dose this evening.  If you develop coughing up blood, new or worsening shortness of breath, severe chest pain, new or worsening headache, neck stiffness, or any other new/concerning symptoms then return to the ER or call 911.

## 2022-10-19 NOTE — ED Provider Notes (Signed)
[facility] DEPT Provider Note   CSN: [id number] Arrival date & time: 10/19/22  1031     History  Chief Complaint  Patient presents with   Headache   Fever    Maria Griffith is a 61 y.o. female.  HPI 61 year old female presents with a chief complaint of headache.  Headache started 2 days ago and has been progressively worsening.  She has felt hot but not known that she had a fever but her temp was 103 in triage.  She has also had multiple other symptoms including cough, congestion, a little bit of sore throat, right-sided chest pain, and some shortness of breath.  She also has body aches and a little bit of photophobia.  No neck stiffness.  She feels generally weak but denies any focal weakness.  No vomiting.  Because she has not been feeling well she also has not been taking her medicines properly.  She is on multiple blood pressure meds that are given multiple times per day but she is only been taking them once per day and has not taken them since yesterday.  Home Medications Prior to Admission medications   Medication Sig Start Date End Date Taking? Authorizing Provider  acetaminophen (TYLENOL) 325 MG tablet Take 2 tablets (650 mg total) by mouth every 6 (six) hours as needed for fever or mild pain. Patient not taking: Reported on 09/19/2022 06/14/22   Barrett, Erin R, PA-C  albuterol (PROVENTIL) (2.5 MG/3ML) 0.083% nebulizer solution USE 1 vial via nebulizer EVERY 6 HOURS AS NEEDED FOR WHEEZING AND SHORTNESS OF BREATH Patient not taking: Reported on 09/19/2022 07/06/22   Kinnie [name], MD  albuterol (VENTOLIN HFA) 108 (90 Base) MCG/ACT inhaler INHALE 2 PUFFS INTO THE LUNGS EVERY 6 HOURS AS NEEDED FOR WHEEZING Patient not taking: Reported on 09/19/2022 06/15/22   Kinnie [name], MD  budesonide-formoterol (SYMBICORT) 160-4.5 MCG/ACT inhaler Inhale 2 puffs into the lungs 2 (two) times daily. Patient taking differently: Inhale 2 puffs into the lungs 2  (two) times daily as needed (shortness of breath). 11/22/21   Kinnie [name], MD  Carboxymethylcellul-Glycerin (LUBRICATING EYE DROPS OP) Place 1 drop into both eyes daily as needed (dry eyes).    [provider]  Cholecalciferol (VITAMIN D3) 75 MCG (3000 UT) TABS Take 1 tablet by mouth daily. Patient not taking: Reported on 09/19/2022 04/06/20   Andrena Mews T, MD  diclofenac Sodium (VOLTAREN) 1 % GEL Apply 2 grams topically TO TO TO SHOULDER 4 TIMES daily as needed FOR PAIN 09/20/22   Andrena Mews T, MD  EPINEPHrine 0.3 mg/0.3 mL IJ SOAJ injection Inject 0.3 mg into the muscle as needed for anaphylaxis. Patient not taking: Reported on 09/19/2022 05/25/22   Lurline Del, DO  fluticasone (FLONASE) 50 MCG/ACT nasal spray USE 2 SPRAYS IN EACH NOSTRIL ONCE A DAY 12/27/21   Kinnie [name], MD  furosemide (LASIX) 20 MG tablet Take 2 tablets (40 mg total) by mouth daily as needed. For swelling 12/27/21   Imogene Burn, PA-C  guaiFENesin (MUCINEX) 600 MG 12 hr tablet Take 1 tablet (600 mg total) by mouth 2 (two) times daily as needed for to loosen phlegm. Patient not taking: Reported on 09/19/2022 06/14/22   Barrett, Lodema Hong, PA-C  hydrALAZINE (APRESOLINE) 50 MG tablet Take 75 mg (ONE AND HALF TABS) in AM and afternoon, take 100 mg (TWO TABS) at bedtime 08/10/22   Burnell Blanks, MD  HYDROcodone-acetaminophen (NORCO) 7.5-325 MG tablet TAKE 1 TABLET  BY MOUTH EVERY 6 HOURS AS NEEDED FOR MODERATE PAIN. Do Not Fill before 10/25/2022 10/09/22   Bayard Hugger, NP  lidocaine (LIDODERM) 5 % Place 1 PATCH onto THE SKIN EVERY DAY AS NEEDED. REMOVE AND discard PATCH WITHIN 12 hours OR AS DIRECTED by doctor 10/09/22   Bayard Hugger, NP  loratadine (CLARITIN) 10 MG tablet TAKE 1 TABLET BY MOUTH EVERY DAY AS NEEDED FOR allergies 05/09/22   Kinnie [name], MD  losartan (COZAAR) 50 MG tablet Take 1 tablet (50 mg total) by mouth 2 (two) times daily. 07/13/22   Burnell Blanks, MD   metoprolol tartrate (LOPRESSOR) 25 MG tablet Take 1 tablet (25 mg total) by mouth 2 (two) times daily. 09/26/22   Burnell Blanks, MD  nystatin ointment (MYCOSTATIN) Apply 1 Application topically 2 (two) times daily. 09/19/22   Kinnie [name], MD  ondansetron (ZOFRAN-ODT) 4 MG disintegrating tablet Take 1 tablet (4 mg total) by mouth every 8 (eight) hours as needed for nausea or vomiting. 07/20/22   Erskine Emery, MD  oxybutynin (DITROPAN) 5 MG tablet TAKE 1 TABLET BY MOUTH 2 TIMES DAILY 07/27/22   Lind Covert, MD  pantoprazole (PROTONIX) 40 MG tablet Take 1 tablet (40 mg total) by mouth daily. 07/20/22   Erskine Emery, MD  polyethylene glycol powder (GLYCOLAX/MIRALAX) 17 GM/SCOOP powder Take 17 g by mouth daily as needed for moderate constipation. 07/14/22   Bayard Hugger, NP  pregabalin (LYRICA) 100 MG capsule Take 1 capsule (100 mg total) by mouth 3 (three) times daily. 10/09/22   Bayard Hugger, NP  senna-docusate (SENNA PLUS) 8.6-50 MG tablet Take 1 tablet by mouth at bedtime. 07/14/22   Bayard Hugger, NP  tiZANidine (ZANAFLEX) 4 MG tablet Take 1 tablet (4 mg total) by mouth 3 (three) times daily. 10/09/22   Bayard Hugger, NP  traZODone (DESYREL) 150 MG tablet Take 1 tablet (150 mg total) by mouth at bedtime. 10/09/22   Bayard Hugger, NP  Vitamins A & D (VITAMIN A & D) ointment Apply 1 application  topically as needed (itching).    [provider]      Allergies    Aripiprazole, Compazine [prochlorperazine edisylate], Iohexol, Phenergan [promethazine hcl], Reglan [metoclopramide], and Clindamycin/lincomycin    Review of Systems   Review of Systems  Constitutional:  Positive for fever.  HENT:  Positive for congestion and sore throat.   Respiratory:  Positive for cough and shortness of breath.   Cardiovascular:  Positive for chest pain.  Gastrointestinal:  Negative for abdominal pain and vomiting.  Musculoskeletal:  Positive for myalgias.     Physical Exam Updated Vital Signs BP (!) 187/87   Pulse 90   Temp (!) 103.2 F (39.6 C) (Oral)   Resp 20   LMP 10/22/2014 (Approximate)   SpO2 97%  Physical Exam Vitals and nursing note reviewed.  Constitutional:      Appearance: She is well-developed.  HENT:     Head: Normocephalic and atraumatic.  Eyes:     Extraocular Movements: Extraocular movements intact.     Pupils: Pupils are equal, round, and reactive to light.  Cardiovascular:     Rate and Rhythm: Normal rate and regular rhythm.     Heart sounds: Normal heart sounds.  Pulmonary:     Effort: Pulmonary effort is normal.     Breath sounds: Normal breath sounds. No wheezing.  Abdominal:     Palpations: Abdomen is soft.     Tenderness:  There is no abdominal tenderness.  Musculoskeletal:     Cervical back: Normal range of motion and neck supple.  Skin:    General: Skin is warm and dry.  Neurological:     Mental Status: She is alert.     Comments: No slurred speech. No facial droop. Patient gives poor effort on strength testing in extremities, but it is symmetric bilaterally in all 4 extremities. Grossly normal sensation     ED Results / Procedures / Treatments   Labs (all labs ordered are listed, but only abnormal results are displayed) Labs Reviewed  COMPREHENSIVE METABOLIC PANEL - Abnormal; Notable for the following components:      Result Value   Glucose, Bld 109 (*)    Creatinine, Ser 1.32 (*)    GFR, Estimated 46 (*)    All other components within normal limits  CBC - Abnormal; Notable for the following components:   WBC 11.2 (*)    RDW 15.6 (*)    All other components within normal limits  RESP PANEL BY RT-PCR (FLU A&B, COVID) ARPGX2  CULTURE, BLOOD (ROUTINE X 2)  CULTURE, BLOOD (ROUTINE X 2)  URINALYSIS, ROUTINE W REFLEX MICROSCOPIC    EKG EKG Interpretation  Date/Time:  Thursday October 19 2022 10:45:54 EDT Ventricular Rate:  96 PR Interval:  148 QRS Duration: 88 QT  Interval:  350 QTC Calculation: 443 R Axis:   12 Text Interpretation: Sinus rhythm LAE, consider biatrial enlargement nonspecific T waves in similar distribution to prior EKGs Confirmed by Sherwood Gambler [phone]) on 10/19/2022 11:24:47 AM  Radiology DG Chest 2 View  Result Date: 10/19/2022 CLINICAL DATA:  Fever EXAM: CHEST - 2 VIEW COMPARISON:  07/06/22 CXR FINDINGS: Status post median sternotomy. Sternotomy wires are intact. No pleural effusion. No pneumothorax. Unchanged cardiac and mediastinal contours. Compared to prior exam there is new focal airspace opacity in the right mid lung, which is worrisome some for infection. Vertebral body heights are maintained. No displaced rib fractures. IMPRESSION: New focal airspace opacity in the right mid lung is worrisome for infection. Electronically Signed   By: Marin Roberts M.D.   On: 10/19/2022 11:36    Procedures Procedures    Medications Ordered in ED Medications  acetaminophen (TYLENOL) tablet 1,000 mg (has no administration in time range)  ketorolac (TORADOL) 15 MG/ML injection 15 mg (has no administration in time range)  droperidol (INAPSINE) 2.5 MG/ML injection 2.5 mg (has no administration in time range)  sodium chloride 0.9 % bolus 1,000 mL (has no administration in time range)  hydrALAZINE (APRESOLINE) tablet 75 mg (has no administration in time range)  losartan (COZAAR) tablet 50 mg (has no administration in time range)  metoprolol tartrate (LOPRESSOR) tablet 25 mg (has no administration in time range)  amoxicillin-clavulanate (AUGMENTIN) 875-125 MG per tablet 1 tablet (has no administration in time range)  doxycycline (VIBRA-TABS) tablet 100 mg (has no administration in time range)    ED Course/ Medical Decision Making/ A&P                           Medical Decision Making Amount and/or Complexity of Data Reviewed Labs: ordered.    Details: Mild this goes along with infection.  Creatinine is mildly elevated but stable from  baseline Radiology: ordered and independent interpretation performed.    Details: Chest x-ray shows right middle lobe pneumonia ECG/medicine tests: ordered and independent interpretation performed.    Details: Nonspecific T waves but similar distribution to  prior EKGs  Risk OTC drugs. Prescription drug management.   Patient appears to have pneumonia.  I think her headache is multifactorial, likely from the acute illness, high fever, and hypertensive urgency.  However no focal deficits or signs of endorgan damage.  She was given treatment for headache which included Toradol, Tylenol, droperidol.  She is also given IV fluids.  She is feeling better.  I think the fever is coming from the pneumonia.  I highly doubt CNS infection such as meningitis/encephalitis.  She was given oral antibiotics.  She does have a mild leukocytosis but is not hypotensive and otherwise seems unlikely to have sepsis.  Seems appropriate for discharge home given normal respiratory rate and normal O2 sats.  Will give return precautions and outpatient antibiotics.  Follow-up with PCP.        Final Clinical Impression(s) / ED Diagnoses Final diagnoses:  Community acquired pneumonia of right middle lobe of lung  Hypertensive urgency    Rx / DC Orders ED Discharge Orders     None         Sherwood Gambler, MD 10/19/22 1340
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
## 2022-10-23 ENCOUNTER — Ambulatory Visit: Payer: Medicare Other | Admitting: Cardiovascular Disease

## 2022-10-24 ENCOUNTER — Telehealth (HOSPITAL_COMMUNITY): Payer: Self-pay

## 2022-10-24 LAB — CULTURE, BLOOD (ROUTINE X 2)
Culture: NO GROWTH
Culture: NO GROWTH

## 2022-10-24 NOTE — Telephone Encounter (Signed)
No response from pt.  Closed referral  

## 2022-10-31 ENCOUNTER — Encounter: Payer: Self-pay | Admitting: Family Medicine

## 2022-10-31 ENCOUNTER — Ambulatory Visit (INDEPENDENT_AMBULATORY_CARE_PROVIDER_SITE_OTHER): Payer: Medicare Other | Admitting: Family Medicine

## 2022-10-31 VITALS — BP 163/109 | HR 94 | Ht 61.0 in | Wt 173.1 lb

## 2022-10-31 DIAGNOSIS — J189 Pneumonia, unspecified organism: Secondary | ICD-10-CM | POA: Diagnosis not present

## 2022-10-31 DIAGNOSIS — I10 Essential (primary) hypertension: Secondary | ICD-10-CM | POA: Diagnosis not present

## 2022-10-31 MED ORDER — NYSTATIN 100000 UNIT/GM EX OINT: 1.0000 | TOPICAL_OINTMENT | Freq: Two times a day (BID) | CUTANEOUS | 0 refills | Status: DC

## 2022-10-31 MED ORDER — HYDRALAZINE HCL 50 MG PO TABS: ORAL_TABLET | ORAL | 11 refills | Status: DC

## 2022-10-31 NOTE — Assessment & Plan Note (Signed)
Uncontrolled despite good medication adherence. Her cardiologist normally manages her BP medications. However, she does not have an upcoming appointment. I recommended going up on her Hydralazine to 75 mg AM, 100 mg PM and QHS. Home BP parameters provided. F/U with Cardiology soon for medication management. She agreed with the plan.

## 2022-10-31 NOTE — Progress Notes (Signed)
    SUBJECTIVE:   CHIEF COMPLAINT / HPI:   PNA: She was seen at the hospital for CAP, for which she was discharged home on AUgmentin and Doxycycline. She has one more dose to complete her treatment. Her cough has improved a lot. No fever, no SOB. Feels well otherwise.   HTN: She is compliant with Losartan 100 mg QD (takes it 50 mg BID per her cardiologist), Metoprolol 25 mg BID, Hydralazine 75 mg AM and PM, and 100 mg QHS. Her home BP runs high. She denies cardiopulmonary or neurologic symptoms at the moment.     PERTINENT  PMH / PSH: PMHx reviewed  OBJECTIVE:   Vitals:   10/31/22 1030 10/31/22 1050 10/31/22 1052  BP: (!) 145/101 (!) 162/113 (!) 163/109  Pulse: 94    SpO2: 100%    Weight: 173 lb 2 oz (78.5 kg)    Height: '5\' 1"'$  (1.549 m)      Physical Exam Vitals and nursing note reviewed.  Cardiovascular:     Rate and Rhythm: Normal rate and regular rhythm.     Comments: Mild right 2nd intercostal aortic click. Pulmonary:     Effort: Pulmonary effort is normal. No respiratory distress.     Breath sounds: Normal breath sounds. No wheezing.      ASSESSMENT/PLAN:   CAP: Hospital lab and images reviewed. Resolving Complete oral A/B as prescribed. F/U as needed.  Essential hypertension Uncontrolled despite good medication adherence. Her cardiologist normally manages her BP medications. However, she does not have an upcoming appointment. I recommended going up on her Hydralazine to 75 mg AM, 100 mg PM and QHS. Home BP parameters provided. F/U with Cardiology soon for medication management. She agreed with the plan.      Andrena Mews, MD [place]

## 2022-10-31 NOTE — Patient Instructions (Addendum)
It was nice seeing you today. Your BP is up. Please increase your hydralazine to 1.5 tablet in the AM and 2 tablets in the afternoon and at bedtime. Please, contact your cardiology office today for BP medication management. And monitor BP closely at home. Your BP goal is 120/70 to 140/90.

## 2022-11-01 ENCOUNTER — Other Ambulatory Visit: Payer: Self-pay | Admitting: Family Medicine

## 2022-11-01 ENCOUNTER — Other Ambulatory Visit: Payer: Self-pay | Admitting: Registered Nurse

## 2022-11-13 ENCOUNTER — Other Ambulatory Visit: Payer: Self-pay

## 2022-11-13 NOTE — Telephone Encounter (Signed)
Maria Griffith is out of the Hydrocodone 7.5-325 MG. She is still waiting on Worker's Comp to approve her visit. Can you still do refills? Please advise. Call back phone [phone].

## 2022-11-14 ENCOUNTER — Telehealth: Payer: Self-pay | Admitting: Registered Nurse

## 2022-11-14 ENCOUNTER — Encounter: Payer: Medicare Other | Admitting: Registered Nurse

## 2022-11-14 NOTE — Telephone Encounter (Signed)
Call placed to [name],  Her Hydrocodone prescription was e-scribed on 10/09/2022 with a Do Not Fill Date for 10/25/2022. [name] states she is waiting on a return call from her lawyer since her case was settled. She is aware she has to be seen in the office if she wants to continue with her Hydrocodone. She verbalizes understanding.

## 2022-11-15 ENCOUNTER — Other Ambulatory Visit: Payer: Self-pay | Admitting: Student

## 2022-11-15 DIAGNOSIS — R110 Nausea: Secondary | ICD-10-CM

## 2022-11-22 ENCOUNTER — Ambulatory Visit: Payer: Medicare Other

## 2022-11-23 ENCOUNTER — Other Ambulatory Visit: Payer: Self-pay | Admitting: Cardiovascular Disease

## 2022-11-23 ENCOUNTER — Ambulatory Visit: Payer: Medicare Other

## 2022-11-23 NOTE — Telephone Encounter (Signed)
Rx refill sent to pharmacy. 

## 2022-12-20 ENCOUNTER — Other Ambulatory Visit: Payer: Self-pay | Admitting: Registered Nurse

## 2022-12-22 ENCOUNTER — Encounter: Payer: Medicare Other | Attending: Registered Nurse | Admitting: Registered Nurse

## 2022-12-22 ENCOUNTER — Encounter: Payer: Self-pay | Admitting: Registered Nurse

## 2022-12-22 VITALS — BP 123/78 | HR 62 | Ht 61.0 in | Wt 178.0 lb

## 2022-12-22 DIAGNOSIS — M7918 Myalgia, other site: Secondary | ICD-10-CM | POA: Insufficient documentation

## 2022-12-22 DIAGNOSIS — M5412 Radiculopathy, cervical region: Secondary | ICD-10-CM

## 2022-12-22 DIAGNOSIS — G8929 Other chronic pain: Secondary | ICD-10-CM | POA: Diagnosis not present

## 2022-12-22 DIAGNOSIS — Z79891 Long term (current) use of opiate analgesic: Secondary | ICD-10-CM

## 2022-12-22 DIAGNOSIS — R202 Paresthesia of skin: Secondary | ICD-10-CM | POA: Insufficient documentation

## 2022-12-22 DIAGNOSIS — Z5181 Encounter for therapeutic drug level monitoring: Secondary | ICD-10-CM | POA: Insufficient documentation

## 2022-12-22 DIAGNOSIS — G894 Chronic pain syndrome: Secondary | ICD-10-CM | POA: Diagnosis not present

## 2022-12-22 DIAGNOSIS — M5416 Radiculopathy, lumbar region: Secondary | ICD-10-CM | POA: Insufficient documentation

## 2022-12-22 DIAGNOSIS — M961 Postlaminectomy syndrome, not elsewhere classified: Secondary | ICD-10-CM | POA: Insufficient documentation

## 2022-12-22 DIAGNOSIS — M4722 Other spondylosis with radiculopathy, cervical region: Secondary | ICD-10-CM | POA: Diagnosis not present

## 2022-12-22 DIAGNOSIS — M542 Cervicalgia: Secondary | ICD-10-CM | POA: Diagnosis not present

## 2022-12-22 DIAGNOSIS — M546 Pain in thoracic spine: Secondary | ICD-10-CM | POA: Diagnosis not present

## 2022-12-22 MED ORDER — HYDROCODONE-ACETAMINOPHEN 7.5-325 MG PO TABS: ORAL_TABLET | ORAL | 0 refills | Status: DC
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
## 2022-12-25 ENCOUNTER — Encounter: Payer: Self-pay | Admitting: Registered Nurse

## 2023-01-05 ENCOUNTER — Ambulatory Visit (INDEPENDENT_AMBULATORY_CARE_PROVIDER_SITE_OTHER): Payer: Medicare Other | Admitting: Family Medicine

## 2023-01-05 ENCOUNTER — Encounter: Payer: Self-pay | Admitting: Family Medicine

## 2023-01-05 VITALS — BP 111/78 | HR 66

## 2023-01-05 DIAGNOSIS — Z0000 Encounter for general adult medical examination without abnormal findings: Secondary | ICD-10-CM

## 2023-01-05 DIAGNOSIS — I5032 Chronic diastolic (congestive) heart failure: Secondary | ICD-10-CM

## 2023-01-05 DIAGNOSIS — I10 Essential (primary) hypertension: Secondary | ICD-10-CM

## 2023-01-05 DIAGNOSIS — G959 Disease of spinal cord, unspecified: Secondary | ICD-10-CM

## 2023-01-05 DIAGNOSIS — R296 Repeated falls: Secondary | ICD-10-CM

## 2023-01-05 DIAGNOSIS — F329 Major depressive disorder, single episode, unspecified: Secondary | ICD-10-CM

## 2023-01-05 NOTE — Addendum Note (Signed)
Addended by: Andrena Mews T on: 01/05/2023 01:01 PM   Modules accepted: Level of Service

## 2023-01-05 NOTE — Patient Instructions (Signed)
To schedule with a psychiatrist, please contact your insurance company to find an International aid/development worker.

## 2023-01-05 NOTE — Assessment & Plan Note (Signed)
PT referral today
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
## 2023-01-10 ENCOUNTER — Other Ambulatory Visit: Payer: Self-pay | Admitting: Registered Nurse

## 2023-01-10 ENCOUNTER — Telehealth: Payer: Self-pay | Admitting: Registered Nurse

## 2023-01-10 ENCOUNTER — Other Ambulatory Visit: Payer: Self-pay | Admitting: Family Medicine

## 2023-01-10 MED ORDER — PREGABALIN 100 MG PO CAPS: 100.0000 mg | ORAL_CAPSULE | Freq: Three times a day (TID) | ORAL | 3 refills | Status: DC

## 2023-01-10 MED ORDER — SENNOSIDES-DOCUSATE SODIUM 8.6-50 MG PO TABS: 1.0000 | ORAL_TABLET | Freq: Every day | ORAL | 5 refills | Status: DC

## 2023-01-10 MED ORDER — TIZANIDINE HCL 4 MG PO TABS: 4.0000 mg | ORAL_TABLET | Freq: Three times a day (TID) | ORAL | 3 refills | Status: DC

## 2023-01-10 NOTE — Telephone Encounter (Signed)
PMP was Reviewed.  Lyrica e-scribed today.  Tizanidine and Senna refills sent to pharmacy.  Ms. Yankovich is aware via My-Chart message.

## 2023-01-12 ENCOUNTER — Ambulatory Visit (INDEPENDENT_AMBULATORY_CARE_PROVIDER_SITE_OTHER): Payer: Medicare Other | Admitting: Family Medicine

## 2023-01-12 ENCOUNTER — Encounter: Payer: Self-pay | Admitting: Family Medicine

## 2023-01-12 ENCOUNTER — Ambulatory Visit (HOSPITAL_COMMUNITY)
Admission: RE | Admit: 2023-01-12 | Discharge: 2023-01-12 | Disposition: A | Discharge status: Home / Self Care | Payer: Medicare Other | Source: Ambulatory Visit | Attending: Registered Nurse | Admitting: Registered Nurse

## 2023-01-12 VITALS — BP 180/111 | HR 77 | Ht 61.0 in | Wt 175.2 lb

## 2023-01-12 DIAGNOSIS — Z113 Encounter for screening for infections with a predominantly sexual mode of transmission: Secondary | ICD-10-CM | POA: Diagnosis not present

## 2023-01-12 DIAGNOSIS — Z114 Encounter for screening for human immunodeficiency virus [HIV]: Secondary | ICD-10-CM

## 2023-01-12 DIAGNOSIS — I10 Essential (primary) hypertension: Secondary | ICD-10-CM

## 2023-01-12 DIAGNOSIS — D709 Neutropenia, unspecified: Secondary | ICD-10-CM

## 2023-01-12 DIAGNOSIS — M79671 Pain in right foot: Secondary | ICD-10-CM | POA: Diagnosis not present

## 2023-01-12 DIAGNOSIS — M67471 Ganglion, right ankle and foot: Secondary | ICD-10-CM

## 2023-01-12 DIAGNOSIS — R2241 Localized swelling, mass and lump, right lower limb: Secondary | ICD-10-CM | POA: Diagnosis not present

## 2023-01-12 HISTORY — DX: Ganglion, right ankle and foot: M67.471

## 2023-01-12 MED ORDER — NYSTATIN 100000 UNIT/GM EX OINT: 1.0000 | TOPICAL_OINTMENT | Freq: Two times a day (BID) | CUTANEOUS | 0 refills | Status: DC

## 2023-01-12 NOTE — Assessment & Plan Note (Signed)
Xray ordered to further assess. Podiatry referral if it worsens. She agreed with the plan.

## 2023-01-12 NOTE — Patient Instructions (Addendum)
  It was nice seeing you. I was unable to observe any bumps with your pelvic exam. Please use Nystatin cream as needed. I have ordered xray to assess the bump on your right foot. I will call you soon with all results.

## 2023-01-12 NOTE — Assessment & Plan Note (Addendum)
Elevated BP due to medication non-adherence. She is currently asymptomatic. She will take her meds as soon as she gets home. FLP checked today.  F/U in 2 weeks for recheck. She agreed with the plan.

## 2023-01-12 NOTE — Progress Notes (Signed)
    SUBJECTIVE:   CHIEF COMPLAINT / HPI:   Foot concern: C/O a painful knot on the top of her right foot, which has been present for three months. The knot has reduced in size but is still painful. She denies any trauma to her foot. No redness.  Vulva lesion: C/O has itchy bumps in her private area, which has been on for a few weeks. She denies vaginal discharge but endorsed hx of unprotected sexual activity 4 months ago. She sometimes have intermittent central abdominal pain which she felt might be related to this. Pain occurs only with movements. No N/V. She has chronic constipations. No blood in her stool.   HTN: Here for f/u. She did not take her BP medication today. Her home BP has been in the 017-510C systolic/ 58-52D diastolic. No other concerns.  Neutropenia: Per the patient, no f/u recommended by her hematologist, the last time she saw them.    PERTINENT  PMH / PSH: PMHx reviewed.  OBJECTIVE:   Vitals:   01/12/23 0912 01/12/23 0955  BP: (!) 182/98 (!) 180/111  Pulse: 77   SpO2: 100%   Weight: 175 lb 4 oz (79.5 kg)   Height: '5\' 1"'$  (1.549 m)     Physical Exam Vitals reviewed. Exam conducted with a chaperone present Cherrie Distance Legette).  Cardiovascular:     Rate and Rhythm: Normal rate and regular rhythm.     Heart sounds: Normal heart sounds. No murmur heard. Pulmonary:     Effort: Pulmonary effort is normal. No respiratory distress.     Breath sounds: Normal breath sounds. No wheezing.  Abdominal:     General: Abdomen is flat. Bowel sounds are normal.     Palpations: Abdomen is soft. There is no mass.     Tenderness: There is no guarding or rebound.     Hernia: No hernia is present.     Comments: Mild superficial tenderness on palpation - non-localized  Genitourinary:    General: Normal vulva.     Comments: No lesion, ulceration or erythema of her vulva, groin or perineal area. Musculoskeletal:     Right lower leg: No edema.     Left lower leg: No edema.      Comments: Small, 0.33m, tender nodular mass on the dorsal surface of her right foot, a few inches away from her 2nd digit. No erythema.      ASSESSMENT/PLAN:   Ganglion cyst of right foot Xray ordered to further assess. Podiatry referral if it worsens. She agreed with the plan.   Essential hypertension Elevated BP due to medication non-adherence. She is currently asymptomatic. She will take her meds as soon as she gets home. FLP checked today.  F/U in 2 weeks for recheck. She agreed with the plan.   Neutropenia (HCC) Recent WBC elevated. Repeat lab today. I will contact her soon with her test result.   Vulva/Skin lesion: Normal GU exam. No skin lesion or breakdown. Etiology of her concern is unclear. Trial Nystatin cream. Refill provided. She will f/u soon if still no improvement. In addition, she likely has superficial intermittent abdominal wall pain. Tylenol as needed for pain.F/U soon if no improvement. She agreed with the plan.    KAndrena Mews MD [place]

## 2023-01-12 NOTE — Assessment & Plan Note (Signed)
Recent WBC elevated. Repeat lab today. I will contact her soon with her test result.

## 2023-01-15 ENCOUNTER — Telehealth: Payer: Self-pay | Admitting: *Deleted

## 2023-01-15 ENCOUNTER — Telehealth: Payer: Self-pay | Admitting: Family Medicine

## 2023-01-15 ENCOUNTER — Telehealth: Payer: Self-pay

## 2023-01-15 DIAGNOSIS — D709 Neutropenia, unspecified: Secondary | ICD-10-CM

## 2023-01-15 NOTE — Telephone Encounter (Addendum)
I discussed all test results with her.   WBC remains low. This is less likely due to her Lyric, as previously commented on by hematology in 2019. Hydralazine or Metoprolol may cause this. Her BP meds are managed by her cardiologists, and she will contact their office for medication adjustment. I did offer her to schedule f/u with me as well to adjust BP meds if she wants. She will get back to me on that after contacting her cardiologist. Referral to hematologists was discussed for reevaluation and consideration for bone marrow biopsy. She wants to proceed with a referral. A referral order was placed.

## 2023-01-15 NOTE — Progress Notes (Unsigned)
  Care Coordination  Outreach Note  01/15/2023 Name: Maria Griffith MRN: [id number] DOB: [birth year]   Care Coordination Outreach Attempts: An unsuccessful telephone outreach was attempted today to offer the patient information about available care coordination services as a benefit of their health plan.   Follow Up Plan:  Additional outreach attempts will be made to offer the patient care coordination information and services.   Encounter Outcome:  No Answer  [place]  Direct Dial: [phone]

## 2023-01-15 NOTE — Telephone Encounter (Signed)
   Telephone encounter was:  Unsuccessful.  01/15/2023 Name: CORBYN STEEDMAN MRN: [id number] DOB: Dec 25, 1960  Unsuccessful outbound call made today to assist with:  Food Insecurity  Outreach Attempt:  1st Attempt  A HIPAA compliant voice message was left requesting a return call.  Instructed patient to call back .    [facility], Care Management  [phone] [address], [place], [place] [address] Phone: [phone] Email: [email or web address]

## 2023-01-15 NOTE — Telephone Encounter (Signed)
Xray report also discussed which is normal. Likely has a small right foot cyst non-detectable by xray. Given that her symptoms is improving, we can monitor. Consider referral to podiatry in the future. She agreed with the plan.

## 2023-01-16 NOTE — Progress Notes (Signed)
  Care Coordination   Note   01/16/2023 Name: Maria Griffith MRN: [id number] DOB: 1961/06/14  Maria Griffith is a 62 y.o. year old female who sees Kinnie [name], MD for primary care. I reached out to [name] by phone today to offer care coordination services.  Ms. Games was given information about Care Coordination services today including:   The Care Coordination services include support from the care team which includes your Nurse Coordinator, Clinical Social Worker, or Pharmacist.  The Care Coordination team is here to help remove barriers to the health concerns and goals most important to you. Care Coordination services are voluntary, and the patient may decline or stop services at any time by request to their care team member.   Care Coordination Consent Status: Patient agreed to services and verbal consent obtained.   Follow up plan:  Telephone appointment with care coordination team member scheduled for:  01/24/23  Encounter Outcome:  Pt. Scheduled   [place]  Direct Dial: [phone]

## 2023-01-17 LAB — LIPID PANEL
Chol/HDL Ratio: 2.9 ratio (ref 0.0–4.4)
Cholesterol, Total: 147 mg/dL (ref 100–199)
HDL: 50 mg/dL (ref >39)
LDL Chol Calc (NIH): 75 mg/dL (ref 0–99)
Triglycerides: 121 mg/dL (ref 0–149)
VLDL Cholesterol Cal: 22 mg/dL (ref 5–40)

## 2023-01-17 LAB — WBC: WBC: 2.1 10*3/uL — CL (ref 3.4–10.8)

## 2023-01-17 LAB — RPR W/REFLEX TO TREPSURE: RPR: NONREACTIVE

## 2023-01-17 LAB — HIV ANTIBODY (ROUTINE TESTING W REFLEX): HIV Screen 4th Generation wRfx: NONREACTIVE

## 2023-01-17 LAB — T PALLIDUM ANTIBODY, EIA: T pallidum Antibody, EIA: NEGATIVE

## 2023-01-18 ENCOUNTER — Ambulatory Visit: Payer: Medicare Other

## 2023-01-18 ENCOUNTER — Encounter: Payer: Medicare Other | Attending: Registered Nurse | Admitting: Registered Nurse

## 2023-01-18 DIAGNOSIS — M542 Cervicalgia: Secondary | ICD-10-CM | POA: Insufficient documentation

## 2023-01-18 DIAGNOSIS — M7918 Myalgia, other site: Secondary | ICD-10-CM | POA: Insufficient documentation

## 2023-01-18 DIAGNOSIS — M546 Pain in thoracic spine: Secondary | ICD-10-CM | POA: Insufficient documentation

## 2023-01-18 DIAGNOSIS — M4722 Other spondylosis with radiculopathy, cervical region: Secondary | ICD-10-CM | POA: Insufficient documentation

## 2023-01-18 DIAGNOSIS — G894 Chronic pain syndrome: Secondary | ICD-10-CM | POA: Insufficient documentation

## 2023-01-18 DIAGNOSIS — G8929 Other chronic pain: Secondary | ICD-10-CM | POA: Insufficient documentation

## 2023-01-18 DIAGNOSIS — M5416 Radiculopathy, lumbar region: Secondary | ICD-10-CM | POA: Insufficient documentation

## 2023-01-18 DIAGNOSIS — R202 Paresthesia of skin: Secondary | ICD-10-CM | POA: Insufficient documentation

## 2023-01-18 DIAGNOSIS — Z79891 Long term (current) use of opiate analgesic: Secondary | ICD-10-CM | POA: Insufficient documentation

## 2023-01-18 DIAGNOSIS — Z5181 Encounter for therapeutic drug level monitoring: Secondary | ICD-10-CM | POA: Insufficient documentation

## 2023-01-18 DIAGNOSIS — M5412 Radiculopathy, cervical region: Secondary | ICD-10-CM | POA: Insufficient documentation

## 2023-01-18 DIAGNOSIS — M961 Postlaminectomy syndrome, not elsewhere classified: Secondary | ICD-10-CM | POA: Insufficient documentation

## 2023-01-19 ENCOUNTER — Telehealth: Payer: Self-pay

## 2023-01-19 NOTE — Telephone Encounter (Signed)
   Telephone encounter was:  Successful.  01/19/2023 Name: Maria Griffith MRN: [id number] DOB: [birth year]  Maria Griffith is a 62 y.o. year old female who is a primary care patient of [place], Phill Myron, MD . The community resource team was consulted for assistance with Food Insecurity and Financial Difficulties related to [place] guide performed the following interventions: Patient provided with information about care guide support team and interviewed to confirm resource needs.Patient has Financial Strain due to only having one income. She cant pay all utilities and buy food. Patients Food stamps has been reduced. I have added referral to [place], mailed resources and gave resources over the phone  Follow Up Plan:  No further follow up planned at this time. The patient has been provided with needed resources.    [facility] [phone] [address], [place], [place] [address] Phone: [phone] Email: [email or web address]

## 2023-01-23 ENCOUNTER — Encounter: Payer: Self-pay | Admitting: Registered Nurse

## 2023-01-23 ENCOUNTER — Encounter: Payer: Medicare Other | Admitting: Registered Nurse

## 2023-01-23 VITALS — BP 119/77 | HR 58 | Ht 61.0 in | Wt 170.0 lb

## 2023-01-23 DIAGNOSIS — G8929 Other chronic pain: Secondary | ICD-10-CM | POA: Diagnosis not present

## 2023-01-23 DIAGNOSIS — M5416 Radiculopathy, lumbar region: Secondary | ICD-10-CM

## 2023-01-23 DIAGNOSIS — G894 Chronic pain syndrome: Secondary | ICD-10-CM

## 2023-01-23 DIAGNOSIS — M961 Postlaminectomy syndrome, not elsewhere classified: Secondary | ICD-10-CM

## 2023-01-23 DIAGNOSIS — R202 Paresthesia of skin: Secondary | ICD-10-CM | POA: Diagnosis not present

## 2023-01-23 DIAGNOSIS — M4722 Other spondylosis with radiculopathy, cervical region: Secondary | ICD-10-CM | POA: Diagnosis not present

## 2023-01-23 DIAGNOSIS — Z79891 Long term (current) use of opiate analgesic: Secondary | ICD-10-CM

## 2023-01-23 DIAGNOSIS — M546 Pain in thoracic spine: Secondary | ICD-10-CM

## 2023-01-23 DIAGNOSIS — M542 Cervicalgia: Secondary | ICD-10-CM

## 2023-01-23 DIAGNOSIS — M7918 Myalgia, other site: Secondary | ICD-10-CM | POA: Diagnosis not present

## 2023-01-23 DIAGNOSIS — M5412 Radiculopathy, cervical region: Secondary | ICD-10-CM | POA: Diagnosis not present

## 2023-01-23 DIAGNOSIS — Z5181 Encounter for therapeutic drug level monitoring: Secondary | ICD-10-CM

## 2023-01-23 MED ORDER — LIDOCAINE 5 % EX PTCH: MEDICATED_PATCH | CUTANEOUS | 1 refills | Status: DC

## 2023-01-23 MED ORDER — HYDROCODONE-ACETAMINOPHEN 7.5-325 MG PO TABS: ORAL_TABLET | ORAL | 0 refills | Status: DC
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
## 2023-01-24 ENCOUNTER — Ambulatory Visit: Payer: Self-pay | Admitting: Licensed Clinical Social Worker

## 2023-01-24 NOTE — Patient Instructions (Signed)
Visit Information  Thank you for taking time to visit with me today. Please don't hesitate to contact me if I can be of assistance to you before our next scheduled telephone appointment.  Following are the goals we discussed today:    Our next appointment is by telephone on 03/05/23 at 1:00 PM   Please call the care guide team at [phone] if you need to cancel or reschedule your appointment.   If you are experiencing a Mental Health or [place] or need someone to talk to, please go to [facility] [place] [phone])   Following is a copy of your full plan of care:   Interventions: LCSW spoke via phone today with client and spoke about client needs Client said she fatigues easily and gets short of breath easily. She said she had open heart surgery in June of 2023.  She has a cane and a wheelchair to use as needed for ambulation. She has some support from her daughter. Client is active in her church and enjoys leading choir in music as a form of relaxation. She said it has been more difficult lately to concentrate. She said she sometimes has difficulty focusing on things. Reviewed sleeping issues. She said she has difficulty sleeping.  Reviewed home access. She lives in her home and has a few steps to access her home. She is able to go in and out of her home. Reviewed medication procurement Discussed mood of client. She said she is a little depressed occasionally. She gets sad because she cannot clean her home as often and cannot do some of the physical things she used to do.  She said PCP had ordered HHPT and HHAide to contact her for support. She is waiting to get call from [facility].  LCSW encouraged client that if she has not heard from [place] by tomorrow to call PCP office to inform them of this information Provided counseling support for client Discussed program support with RN,Pharmacy  and Social Work Client agreed for LCSW to call her in 5 weeks to assess her needs at that time  Ms. Moss was given information about Care Management services by the embedded care coordination team including:  Care Management services include personalized support from designated clinical staff supervised by her physician, including individualized plan of care and coordination with other care providers 24/7 contact phone numbers for assistance for urgent and routine care needs. The patient may stop CCM services at any time (effective at the end of the month) by phone call to the office staff.  Patient agreed to services and verbal consent obtained.   Norva Riffle.Mubarak Bevens MSW, [place] representative [facility] Care Management [phone]

## 2023-01-24 NOTE — Patient Outreach (Signed)
  Care Coordination   Initial Visit Note   01/24/2023 Name: Maria Griffith MRN: [id number] DOB: 1961-11-27  Maria Griffith is a 62 y.o. year old female who sees Kinnie [name], MD for primary care. I spoke with  [name] by phone today.  What matters to the patients health and wellness today? Patient stated she needs in home help with chores and cleaning issues. She cannot do these tasks due to easily being short of breath or being fatigued    Goals Addressed               This Visit's Progress     Patient Stated she needs in home help with chores, cleaning issues (pt-stated)        Interventions: LCSW spoke via phone today with client and spoke about client needs Client said she fatigues easily and gets short of breath easily. She said she had open heart surgery in June of 2023.  She has a cane and a wheelchair to use as needed for ambulation. She has some support from her daughter. Client is active in her church and enjoys leading choir in music as a form of relaxation. She said it has been more difficult lately to concentrate. She said she sometimes has difficulty focusing on things. Reviewed sleeping issues. She said she has difficulty sleeping.  Reviewed home access. She lives in her home and has a few steps to access her home. She is able to go in and out of her home. Reviewed medication procurement Discussed mood of client. She said she is a little depressed occasionally. She gets sad because she cannot clean her home as often and cannot do some of the physical things she used to do.  She said PCP had ordered HHPT and HHAide to contact her for support. She is waiting to get call from [facility].  LCSW encouraged client that if she has not heard from [place] by tomorrow to call PCP office to inform them of this information Provided counseling support for client Discussed program support with RN,Pharmacy and Social Work Client agreed for LCSW to  call her in 5 weeks to assess her needs at that time       SDOH assessments and interventions completed:  Yes  SDOH Interventions Today    Flowsheet Row Most Recent Value  SDOH Interventions   Depression Interventions/Treatment  Counseling  Physical Activity Interventions Other (Comments)  [difficulty walking. uses a cane to help her walk]  Stress Interventions Provide Counseling  [stress related to managing medical needs]        Care Coordination Interventions:  Yes, provided   Follow up plan: Follow up call scheduled for 03/05/23 at 1:00 PM     Encounter Outcome:  Pt. Visit Completed   Norva Riffle.Morgon Pamer MSW, [place] Holiday representative [facility] Care Management [phone]

## 2023-02-19 ENCOUNTER — Other Ambulatory Visit: Payer: Self-pay | Admitting: Registered Nurse

## 2023-02-20 ENCOUNTER — Encounter: Payer: Medicare Other | Attending: Registered Nurse | Admitting: Registered Nurse

## 2023-02-20 ENCOUNTER — Encounter: Payer: Self-pay | Admitting: Registered Nurse

## 2023-02-20 VITALS — BP 159/91 | HR 54 | Ht 61.0 in | Wt 170.0 lb

## 2023-02-20 DIAGNOSIS — R202 Paresthesia of skin: Secondary | ICD-10-CM | POA: Diagnosis not present

## 2023-02-20 DIAGNOSIS — G894 Chronic pain syndrome: Secondary | ICD-10-CM | POA: Diagnosis not present

## 2023-02-20 DIAGNOSIS — G8929 Other chronic pain: Secondary | ICD-10-CM | POA: Insufficient documentation

## 2023-02-20 DIAGNOSIS — Z5181 Encounter for therapeutic drug level monitoring: Secondary | ICD-10-CM

## 2023-02-20 DIAGNOSIS — M7918 Myalgia, other site: Secondary | ICD-10-CM | POA: Diagnosis not present

## 2023-02-20 DIAGNOSIS — M5412 Radiculopathy, cervical region: Secondary | ICD-10-CM | POA: Diagnosis not present

## 2023-02-20 DIAGNOSIS — Z79891 Long term (current) use of opiate analgesic: Secondary | ICD-10-CM

## 2023-02-20 DIAGNOSIS — M5416 Radiculopathy, lumbar region: Secondary | ICD-10-CM | POA: Diagnosis not present

## 2023-02-20 DIAGNOSIS — M961 Postlaminectomy syndrome, not elsewhere classified: Secondary | ICD-10-CM | POA: Diagnosis not present

## 2023-02-20 DIAGNOSIS — M546 Pain in thoracic spine: Secondary | ICD-10-CM

## 2023-02-20 DIAGNOSIS — M542 Cervicalgia: Secondary | ICD-10-CM

## 2023-02-20 DIAGNOSIS — G4709 Other insomnia: Secondary | ICD-10-CM

## 2023-02-20 MED ORDER — HYDROCODONE-ACETAMINOPHEN 7.5-325 MG PO TABS: ORAL_TABLET | ORAL | 0 refills | Status: DC

## 2023-02-20 MED ORDER — DICLOFENAC SODIUM 1 % EX GEL: CUTANEOUS | 3 refills | Status: DC
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
## 2023-02-23 LAB — DRUG TOX MONITOR 1 W/CONF, ORAL FLD
Amphetamines: NEGATIVE ng/mL (ref <10)
Barbiturates: NEGATIVE ng/mL (ref <10)
Benzodiazepines: NEGATIVE ng/mL (ref <0.50)
Buprenorphine: NEGATIVE ng/mL (ref <0.10)
Cocaine: NEGATIVE ng/mL (ref <5.0)
Codeine: NEGATIVE ng/mL (ref <2.5)
Dihydrocodeine: 4.1 ng/mL — ABNORMAL HIGH (ref <2.5)
Fentanyl: NEGATIVE ng/mL (ref <0.10)
Heroin Metabolite: NEGATIVE ng/mL (ref <1.0)
Hydrocodone: 39.9 ng/mL — ABNORMAL HIGH (ref <2.5)
Hydromorphone: NEGATIVE ng/mL (ref <2.5)
MARIJUANA: NEGATIVE ng/mL (ref <2.5)
MDMA: NEGATIVE ng/mL (ref <10)
Meprobamate: NEGATIVE ng/mL (ref <2.5)
Methadone: NEGATIVE ng/mL (ref <5.0)
Morphine: NEGATIVE ng/mL (ref <2.5)
Nicotine Metabolite: NEGATIVE ng/mL (ref <5.0)
Norhydrocodone: 4.6 ng/mL — ABNORMAL HIGH (ref <2.5)
Noroxycodone: NEGATIVE ng/mL (ref <2.5)
Opiates: POSITIVE ng/mL — AB (ref <2.5)
Oxycodone: NEGATIVE ng/mL (ref <2.5)
Oxymorphone: NEGATIVE ng/mL (ref <2.5)
Phencyclidine: NEGATIVE ng/mL (ref <10)
Tapentadol: NEGATIVE ng/mL (ref <5.0)
Tramadol: NEGATIVE ng/mL (ref <5.0)
Zolpidem: NEGATIVE ng/mL (ref <5.0)

## 2023-02-23 LAB — DRUG TOX ALC METAB W/CON, ORAL FLD: Alcohol Metabolite: NEGATIVE ng/mL (ref <25)

## 2023-02-26 NOTE — Addendum Note (Signed)
Addended by: Andrena Mews T on: 02/26/2023 10:22 AM   Modules accepted: Orders

## 2023-02-27 ENCOUNTER — Encounter: Payer: Self-pay | Admitting: Family Medicine

## 2023-02-27 ENCOUNTER — Telehealth: Payer: Self-pay

## 2023-02-27 ENCOUNTER — Ambulatory Visit (INDEPENDENT_AMBULATORY_CARE_PROVIDER_SITE_OTHER): Payer: Medicare Other | Admitting: Family Medicine

## 2023-02-27 ENCOUNTER — Ambulatory Visit (HOSPITAL_COMMUNITY)
Admission: RE | Admit: 2023-02-27 | Discharge: 2023-02-27 | Disposition: A | Discharge status: Home / Self Care | Payer: Medicare Other | Source: Ambulatory Visit | Attending: Family Medicine | Admitting: Family Medicine

## 2023-02-27 VITALS — BP 194/143 | HR 58 | Temp 98.1°F | Ht 61.0 in | Wt 168.0 lb

## 2023-02-27 DIAGNOSIS — R051 Acute cough: Secondary | ICD-10-CM | POA: Insufficient documentation

## 2023-02-27 DIAGNOSIS — R197 Diarrhea, unspecified: Secondary | ICD-10-CM | POA: Diagnosis not present

## 2023-02-27 DIAGNOSIS — I10 Essential (primary) hypertension: Secondary | ICD-10-CM | POA: Diagnosis not present

## 2023-02-27 DIAGNOSIS — R059 Cough, unspecified: Secondary | ICD-10-CM | POA: Diagnosis not present

## 2023-02-27 LAB — POC SOFIA 2 FLU + SARS ANTIGEN FIA
Influenza A, POC: NEGATIVE
Influenza B, POC: NEGATIVE
SARS Coronavirus 2 Ag: NEGATIVE

## 2023-02-27 MED ORDER — AZITHROMYCIN 250 MG PO TABS: ORAL_TABLET | ORAL | 0 refills | Status: DC

## 2023-02-27 MED ORDER — HYDROCOD POLI-CHLORPHE POLI ER 10-8 MG/5ML PO SUER: 5.0000 mL | Freq: Two times a day (BID) | ORAL | 0 refills | Status: AC | PRN

## 2023-02-27 NOTE — Telephone Encounter (Signed)
I called to check on her. She is aware of her Tussionex insurance coverage status. We will try prioauth if we receive request. Otherwise, I advised her to continue Mucinex or may use Robitussin. ED precaution discussed. I also get her phone numbers for all the referrals she missed - CCM, Hematology and PT. She will call to reschedule/schedule her appointments.

## 2023-02-27 NOTE — Progress Notes (Signed)
    SUBJECTIVE:   CHIEF COMPLAINT / HPI:   Cough This is a new problem. Episode onset: Started coughing 4 days ago. The problem has been unchanged. The problem occurs constantly. The cough is Productive of sputum. Pertinent negatives include no chest pain, fever, headaches, sore throat, shortness of breath or wheezing. Associated symptoms comments: Felt warm a few days ago, but did not measure her temp.. Nothing aggravates the symptoms. Risk factors: Exposure to grandkids with cold symptoms. Treatments tried: Mucinex, albuterol. The treatment provided moderate relief. Her past medical history is significant for asthma.  Diarrhea  This is a new problem. The current episode started yesterday. The problem occurs 2 to 4 times per day. The problem has been unchanged. Diarrhea characteristics: Pasty. Associated symptoms include abdominal pain and coughing. Pertinent negatives include no bloating, fever, headaches or vomiting. Associated symptoms comments: Feels belly cramps with the diarrhea.. Nothing aggravates the symptoms. There are no known risk factors. She has tried nothing for the symptoms.   HTN: She did not take her medication today due to poor appetitie.  PERTINENT  PMH / PSH: PMHx reviewed  OBJECTIVE:   BP (!) 194/143   Pulse (!) 58   Temp 98.1 F (36.7 C)   Ht 5\' 1"  (1.549 m)   Wt 168 lb (76.2 kg)   LMP 10/22/2014 (Approximate)   SpO2 100%   BMI 31.74 kg/m   Physical Exam Vitals and nursing note reviewed.  Constitutional:      Appearance: She is not toxic-appearing.  HENT:     Right Ear: Tympanic membrane normal.     Left Ear: Tympanic membrane normal.     Mouth/Throat:     Mouth: Mucous membranes are moist.     Pharynx: No oropharyngeal exudate or posterior oropharyngeal erythema.  Eyes:     Extraocular Movements: Extraocular movements intact.     Conjunctiva/sclera: Conjunctivae normal.     Pupils: Pupils are equal, round, and reactive to light.  Cardiovascular:      Rate and Rhythm: Normal rate and regular rhythm.     Heart sounds: Normal heart sounds. No murmur heard. Pulmonary:     Effort: Pulmonary effort is normal. No respiratory distress.     Breath sounds: Normal breath sounds. No wheezing.  Abdominal:     General: Bowel sounds are normal. There is no distension.     Palpations: Abdomen is soft.     Tenderness: There is no abdominal tenderness.  Musculoskeletal:     Cervical back: Normal range of motion.      ASSESSMENT/PLAN:   Essential hypertension BP elevated even after repeat. She is off her meds today. She prefers that her cards manages her med. She will f/u with them for BP meds management.   Cough: Neg COVID 19 and influenza POC test. Chest xray ordered. Z-pak prescribed for empirical treatment for bacterial bronchitis. ED precautions discussed.  Diarrhea: Abdominal exam benign. Likely viral. Hydration and advance diet as tolerated discussed. F.U as needed.  Andrena Mews, MD [place]

## 2023-02-27 NOTE — Patient Instructions (Signed)
It was nice seeing you today. Your COVID 19 and Flu tests are negative. We will start you on antibiotic for presumed bronchitis and obtain chest xray. Please call or go to the ED if symptoms worsens.

## 2023-02-27 NOTE — Assessment & Plan Note (Signed)
BP elevated even after repeat. She is off her meds today. She prefers that her cards manages her med. She will f/u with them for BP meds management.

## 2023-02-27 NOTE — Telephone Encounter (Signed)
Patient calls nurse line regarding cough medication not being covered through insurance.   Called pharmacy. Pharmacist reports that cash price for medication would be $65.73. States that medication is getting rejected due to not being covered under Medicare Part D Law. She states that PA request was faxed to our office earlier today.   Will forward to [place] for further advisement.   If PA is unable to be completed, patient is requesting alternative medication.   Talbot Grumbling, RN

## 2023-03-01 ENCOUNTER — Telehealth: Payer: Self-pay | Admitting: Family Medicine

## 2023-03-01 ENCOUNTER — Other Ambulatory Visit (HOSPITAL_COMMUNITY): Payer: Self-pay

## 2023-03-01 ENCOUNTER — Telehealth: Payer: Self-pay | Admitting: *Deleted

## 2023-03-01 ENCOUNTER — Telehealth: Payer: Self-pay | Admitting: Hematology

## 2023-03-01 NOTE — Telephone Encounter (Signed)
A Prior Authorization was initiated for this patients Hydrocod Poli-Chlorphe Poli ER 10-'8MG'$ /5ML through CoverMyMeds.   Key: [id number]

## 2023-03-01 NOTE — Telephone Encounter (Signed)
Chest xray discussed. No acute PNA. Complete Zithromax as discussed for bacterial bronchitis. Return precautions discussed.

## 2023-03-01 NOTE — Telephone Encounter (Signed)
Oral swab drug screen was consistent for prescribed medications.  ?

## 2023-03-01 NOTE — Telephone Encounter (Signed)
scheduled per 3/11 referral, pt has been called and confirmed date and time. Pt is aware of location and to arrive early for check in

## 2023-03-02 NOTE — Telephone Encounter (Signed)
Prior Auth for patients medication Hydrocod Poli-Chlorphe Poli ER 10-8MG /5ML  denied by [facility] via CoverMyMeds.   Reason: HYDPOL/CPM SUS108/5ML is not a Part D eligible medication as defined by the Medicare Part D benefit and is not covered under your Part D prescription drug plan.  CoverMyMeds Key: [id number]

## 2023-03-05 ENCOUNTER — Ambulatory Visit: Payer: Medicare Other | Attending: Family Medicine

## 2023-03-05 ENCOUNTER — Ambulatory Visit: Payer: Self-pay | Admitting: Licensed Clinical Social Worker

## 2023-03-05 NOTE — Patient Outreach (Signed)
  Care Coordination   Follow Up Visit Note   03/05/2023 Name: Maria Griffith MRN: [id number] DOB: Feb 03, 1961  Maria Griffith is a 62 y.o. year old female who sees Maria [name], MD for primary care. I spoke with  Maria Griffith by phone today.  What matters to the patients health and wellness today? Patient stated that she needed in home help with  chores and cleaning activities     Goals Addressed               This Visit's Progress     Patient Stated she needs in home help with chores, cleaning issues (pt-stated)        Interventions: LCSW spoke via phone today with client and spoke about client needs. Client said she gets short of breath regularly. She uses nebulizer as needed for treatments. She uses inhaler as needed. Discussed energy. She said she cannot reach over very well without getting short of breath. She said PCP had ordered home health aide to help her in home; however home health aid has not come out to her home yet. Patient to call PCP office and alert PCP that home health aide has not come out to help patient yet in the home. Patient also said PCP ordered Outpatient Physical Therapy for client Discussed pain issues of client. She said she sees medical provider related to pain management.  Client and LCSW spoke of her family support Provided counseling support for client related to managing medical needs at present.  Dicussed mood. She said she has been a little sad related to trying to manage her medical needs at present.  She is taking medications as prescribed. Encouraged Maria Griffith to call LCSW as needed for SW support at [phone].     SDOH assessments and interventions completed:  Yes  SDOH Interventions Today    Flowsheet Row Most Recent Value  SDOH Interventions   Depression Interventions/Treatment  Counseling  Stress Interventions Provide Counseling  [client has stress related to managing medical conditions]        Care Coordination  Interventions:  Yes, provided    Interventions Today    Flowsheet Row Most Recent Value  Chronic Disease   Chronic disease during today's visit Other  [discussed client needs]  General Interventions   General Interventions Discussed/Reviewed General Interventions Discussed, [employer]  [discussed program support]  Education Interventions   Education Provided Provided Education  [discussed client communicating with PCP office to discuss her current needs. She said PCP ordered Outpatient physcal therapy for her and ordered home health aide to assist her as scheduled]  Provided Verbal Education On [place] Discussed/Reviewed Anxiety, Coping Strategies  [discussed her managing stress isssues. She has some stress related to managing medical needs]       Follow up plan: Follow up call scheduled for 03/20/23 at 10:00 AM    Encounter Outcome:  Pt. Visit Completed   Norva Riffle.Daziyah Cogan MSW, [place] [facility] representative [facility] Care Management [phone]

## 2023-03-05 NOTE — Patient Instructions (Signed)
Visit Information  Thank you for taking time to visit with me today. Please don't hesitate to contact me if I can be of assistance to you.   Following are the goals we discussed today:   Goals Addressed               This Visit's Progress     Patient Stated she needs in home help with chores, cleaning issues (pt-stated)        Interventions: LCSW spoke via phone today with client and spoke about client needs. Client said she gets short of breath regularly. She uses nebulizer as needed for treatments. She uses inhaler as needed. Discussed energy. She said she cannot reach over very well without getting short of breath. She said PCP had ordered home health aide to help her in home; however home health aid has not come out to her home yet. Patient to call PCP office and alert PCP that home health aide has not come out to help patient yet in the home. Patient also said PCP ordered Outpatient Physical Therapy for client Discussed pain issues of client. She said she sees medical provider related to pain management.  Client and LCSW spoke of her family support Provided counseling support for client related to managing medical needs at present.  Dicussed mood. She said she has been a little sad related to trying to manage her medical needs at present.  She is taking medications as prescribed. Encouraged Maria Griffith to call LCSW as needed for SW support at [phone].     Our next appointment is by telephone on 03/20/23 at 10:00 AM   Please call the care guide team at [phone] if you need to cancel or reschedule your appointment.   If you are experiencing a Mental Health or [place] or need someone to talk to, please go to [facility] [address] [place] [phone])   Patient verbalizes understanding of instructions and care plan provided today and agrees to view in [place]. Active MyChart status and patient understanding of how  to access instructions and care plan via MyChart confirmed with patient.     The patient has been provided with contact information for the care management team and has been advised to call with any health related questions or concerns.    Norva Riffle.Yavier Snider MSW, [place] [facility] representative [facility] Care Management [phone]

## 2023-03-07 ENCOUNTER — Other Ambulatory Visit: Payer: Self-pay | Admitting: Registered Nurse

## 2023-03-09 ENCOUNTER — Ambulatory Visit: Payer: Medicare Other

## 2023-03-20 ENCOUNTER — Encounter: Payer: Medicare Other | Admitting: Registered Nurse

## 2023-03-20 ENCOUNTER — Ambulatory Visit: Payer: Self-pay | Admitting: Licensed Clinical Social Worker

## 2023-03-20 NOTE — Patient Instructions (Signed)
Visit Information  Thank you for taking time to visit with me today. Please don't hesitate to contact me if I can be of assistance to you.   Following are the goals we discussed today:   Goals Addressed             This Visit's Progress    Patient Stated previously she needed help with cleaning at her home and needed help with in home chores       Interventions:  LCSW spoke via phone with client today. Client has said previously that she needed help with in home cleaning and with in home chores. Client has home health aide who helps her weekly in the home  Client also has MDD and PTSD.  LCSW tried to talk with client today about her needs. Client requested that LCSW call her at another appointment time. Client said she was not feeling well today. LCSW to call client at another appointment time for phone visit with client     Our next appointment is by telephone on 05/08/23 at 2:00 PM   Please call the care guide team at [phone] if you need to cancel or reschedule your appointment.   If you are experiencing a Mental Health or [place] or need someone to talk to, please go to [facility] [place] [phone])   The patient verbalized understanding of instructions, educational materials, and care plan provided today and DECLINED offer to receive copy of patient instructions, educational materials, and care plan.   The patient has been provided with contact information for the care management team and has been advised to call with any health related questions or concerns.   Norva Riffle.Julis Haubner MSW, [employer] representative [facility] Care Management [phone]

## 2023-03-20 NOTE — Patient Outreach (Signed)
  Care Coordination   Follow Up Visit Note   03/20/2023 Name: Maria Griffith MRN: [id number] DOB: Nov 08, 1961  Maria Griffith is a 62 y.o. year old female who sees Kinnie [name], MD for primary care. I spoke with  [name] by phone today.  What matters to the patients health and wellness today?  Patient needs help with cleaning at her home and needs help with weekly chores at her home    Goals Addressed             This Visit's Progress    Patient Stated previously she needed help with cleaning at her home and needed help with in home chores       Interventions:  LCSW spoke via phone with client today. Client has said previously that she needed help with in home cleaning and with in home chores. Client has home health aide who helps her weekly in the home  Client also has MDD and PTSD.  LCSW tried to talk with client today about her needs. Client requested that LCSW call her at another appointment time. Client said she was not feeling well today. LCSW to call client at another appointment time for phone visit with client     SDOH assessments and interventions completed:  Yes  SDOH Interventions Today    Flowsheet Row Most Recent Value  SDOH Interventions   Depression Interventions/Treatment  Counseling  Physical Activity Interventions Other (Comments)  [decreased physical activity]  Stress Interventions Provide Counseling  [stress in managing medical needs]        Care Coordination Interventions:  Yes, provided   Interventions Today    Flowsheet Row Most Recent Value  Chronic Disease   Chronic disease during today's visit Other  [discussed program support]  General Interventions   General Interventions Discussed/Reviewed General Interventions Discussed, Emergency planning/management officer  [discussed support with RN, Neurosurgeon and Pharmacist]  Education Interventions   Education Provided Provided Education  Provided Orthoptist On [employer]  Mental Health  Interventions   Mental Health Discussed/Reviewed Anxiety, Coping Strategies        Follow up plan: Follow up call scheduled for 05/08/23 at 2:00 PM     Encounter Outcome:  Pt. Visit Completed   Norva Riffle.Keaundre Thelin MSW, [employer] representative [facility] Care Management [phone]

## 2023-03-21 ENCOUNTER — Encounter: Payer: Self-pay | Admitting: Registered Nurse

## 2023-03-21 ENCOUNTER — Encounter: Payer: Medicare Other | Attending: Registered Nurse | Admitting: Registered Nurse

## 2023-03-21 VITALS — BP 129/80 | HR 74 | Ht 61.0 in | Wt 169.0 lb

## 2023-03-21 DIAGNOSIS — G4709 Other insomnia: Secondary | ICD-10-CM | POA: Diagnosis not present

## 2023-03-21 DIAGNOSIS — M542 Cervicalgia: Secondary | ICD-10-CM | POA: Insufficient documentation

## 2023-03-21 DIAGNOSIS — M5416 Radiculopathy, lumbar region: Secondary | ICD-10-CM | POA: Insufficient documentation

## 2023-03-21 DIAGNOSIS — G894 Chronic pain syndrome: Secondary | ICD-10-CM | POA: Diagnosis not present

## 2023-03-21 DIAGNOSIS — Z79891 Long term (current) use of opiate analgesic: Secondary | ICD-10-CM | POA: Diagnosis not present

## 2023-03-21 DIAGNOSIS — R202 Paresthesia of skin: Secondary | ICD-10-CM | POA: Insufficient documentation

## 2023-03-21 DIAGNOSIS — G8929 Other chronic pain: Secondary | ICD-10-CM | POA: Diagnosis not present

## 2023-03-21 DIAGNOSIS — M7918 Myalgia, other site: Secondary | ICD-10-CM | POA: Insufficient documentation

## 2023-03-21 DIAGNOSIS — Z5181 Encounter for therapeutic drug level monitoring: Secondary | ICD-10-CM | POA: Insufficient documentation

## 2023-03-21 DIAGNOSIS — M546 Pain in thoracic spine: Secondary | ICD-10-CM | POA: Insufficient documentation

## 2023-03-21 DIAGNOSIS — M5412 Radiculopathy, cervical region: Secondary | ICD-10-CM | POA: Diagnosis not present

## 2023-03-21 MED ORDER — HYDROCODONE-ACETAMINOPHEN 7.5-325 MG PO TABS: ORAL_TABLET | ORAL | 0 refills | Status: DC
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
## 2023-03-22 ENCOUNTER — Ambulatory Visit: Payer: Medicare Other | Admitting: Registered Nurse

## 2023-04-02 ENCOUNTER — Other Ambulatory Visit: Payer: Self-pay | Admitting: Registered Nurse

## 2023-04-10 ENCOUNTER — Ambulatory Visit: Payer: Self-pay | Admitting: Licensed Clinical Social Worker

## 2023-04-10 ENCOUNTER — Inpatient Hospital Stay: Payer: Medicare Other

## 2023-04-10 ENCOUNTER — Inpatient Hospital Stay: Payer: Medicare Other | Attending: Hematology | Admitting: Hematology

## 2023-04-10 NOTE — Patient Instructions (Signed)
Visit Information  Thank you for taking time to visit with me today. Please don't hesitate to contact me if I can be of assistance to you.   Following are the goals we discussed today:   Goals Addressed             This Visit's Progress    Patient Stated she has pain issues, she has difficulty walking. she uses a cane to help her walk. She needs help with home cleaning       Interventions:  LCSW spoke with client about her needs. She spoke of pain issues. She spoke of heart surgery in June of 2023. She spoke of pain issues in her chest. She plans to call MD related to chest pain issues. She plans to call surgeon to discuss chest pain issues.  Discussed medications procurement Discussed food supply. Discussed mood of client. She has stress related to managing medical needs. She has stress related to finances Discussed Medicaid application. She said she has applied for Medicaid previously. She said she talked with Medicaid case worker and supplied documents for application to Medicaid case worker. She has been denied at present for Medicaid. She said she may call case worker to learn more about reason for denial of Medicaid at this time Provided counseling support Discussed financial strain issues Encouraged client to call LCSW as needed at [phone]         Our next appointment is by telephone on 05/15/23 at 2:30 PM   Please call the care guide team at [phone] if you need to cancel or reschedule your appointment.   If you are experiencing a Mental Health or Behavioral Health Crisis or need someone to talk to, please go to [facility] [address], [place] [phone])   The patient verbalized understanding of instructions, educational materials, and care plan provided today and DECLINED offer to receive copy of patient instructions, educational materials, and care plan.   The patient has been provided with contact information for  the care management team and has been advised to call with any health related questions or concerns.   Kelton Pillar.Abdalrahman Clementson MSW, LCSW Licensed Visual merchandiser [facility] Care Management [phone]

## 2023-04-10 NOTE — Patient Outreach (Signed)
  Care Coordination   Follow Up Visit Note   04/10/2023 Name: Maria Griffith MRN: [id number] DOB: 06/20/1961  Maria Griffith is a 62 y.o. year old female who sees Maria Eland, MD for primary care. I spoke with  Maria Griffith by phone today.  What matters to the patients health and wellness today?  Client has pain issues. She has difficulty walking. She uses a cane to help her walk. She needs help with home cleaning activities    Goals Addressed             This Visit's Progress    Patient Stated she has pain issues, she has difficulty walking. she uses a cane to help her walk. She needs help with home cleaning       Interventions:  LCSW spoke with client about her needs. She spoke of pain issues. She spoke of heart surgery in June of 2023. She spoke of pain issues in her chest. She plans to call MD related to chest pain issues. She plans to call surgeon to discuss chest pain issues.  Discussed medications procurement Discussed food supply. Discussed mood of client. She has stress related to managing medical needs. She has stress related to finances Discussed Medicaid application. She said she has applied for Medicaid previously. She said she talked with Medicaid case worker and supplied documents for application to Medicaid case worker. She has been denied at present for Medicaid. She said she may call case worker to learn more about reason for denial of Medicaid at this time Provided counseling support Discussed financial strain issues Encouraged client to call LCSW as needed at [phone]         SDOH assessments and interventions completed:  Yes  SDOH Interventions Today    Flowsheet Row Most Recent Value  SDOH Interventions   Depression Interventions/Treatment  Counseling  Financial Strain Interventions Other (Comment)  [difficulty paying bills.]  Physical Activity Interventions Other (Comments)  [walking challenges,  uses a cane to help her walk]  Stress  Interventions Provide Counseling  Maria Griffith is concerned about finances. she is stressed about paying bills]        Care Coordination Interventions:  Yes, provided   Interventions Today    Flowsheet Row Most Recent Value  Chronic Disease   Chronic disease during today's visit Other  [talked with client about her needs. She spoke of pain issues and of walking challenges]  General Interventions   General Interventions Discussed/Reviewed General Interventions Discussed, Community Resources  Exercise Interventions   Exercise Discussed/Reviewed Physical Activity  [client uses a cane to help her walk]  Physical Activity Discussed/Reviewed Physical Activity Discussed  Education Interventions   Education Provided Provided Education  Provided Verbal Education On [employer] application. discussed home health aide services]  Mental Health Interventions   Mental Health Discussed/Reviewed Anxiety, Coping Strategies  [discussed stress management. She has stress related to paying monthly bills]  Nutrition Interventions   Nutrition Discussed/Reviewed Nutrition Discussed        Follow up plan: Follow up call scheduled for 05/15/23 at 2:30 PM     Encounter Outcome:  Pt. Visit Completed   Kelton Pillar.Aycen Porreca MSW, LCSW Licensed Visual merchandiser [facility] Care Management [phone]

## 2023-04-17 ENCOUNTER — Other Ambulatory Visit: Payer: Self-pay | Admitting: Registered Nurse

## 2023-04-18 ENCOUNTER — Other Ambulatory Visit: Payer: Self-pay | Admitting: Family Medicine

## 2023-04-18 DIAGNOSIS — R110 Nausea: Secondary | ICD-10-CM

## 2023-04-19 ENCOUNTER — Encounter: Payer: Medicare Other | Attending: Registered Nurse | Admitting: Registered Nurse

## 2023-04-19 ENCOUNTER — Encounter: Payer: Self-pay | Admitting: Registered Nurse

## 2023-04-19 ENCOUNTER — Other Ambulatory Visit: Payer: Self-pay | Admitting: Family Medicine

## 2023-04-19 VITALS — BP 216/122 | HR 68 | Ht 61.0 in | Wt 170.4 lb

## 2023-04-19 DIAGNOSIS — M542 Cervicalgia: Secondary | ICD-10-CM | POA: Diagnosis not present

## 2023-04-19 DIAGNOSIS — M5416 Radiculopathy, lumbar region: Secondary | ICD-10-CM | POA: Insufficient documentation

## 2023-04-19 DIAGNOSIS — Z79891 Long term (current) use of opiate analgesic: Secondary | ICD-10-CM | POA: Insufficient documentation

## 2023-04-19 DIAGNOSIS — G4709 Other insomnia: Secondary | ICD-10-CM | POA: Diagnosis not present

## 2023-04-19 DIAGNOSIS — M546 Pain in thoracic spine: Secondary | ICD-10-CM

## 2023-04-19 DIAGNOSIS — G894 Chronic pain syndrome: Secondary | ICD-10-CM | POA: Diagnosis not present

## 2023-04-19 DIAGNOSIS — I10 Essential (primary) hypertension: Secondary | ICD-10-CM | POA: Diagnosis not present

## 2023-04-19 DIAGNOSIS — M7918 Myalgia, other site: Secondary | ICD-10-CM | POA: Insufficient documentation

## 2023-04-19 DIAGNOSIS — M5412 Radiculopathy, cervical region: Secondary | ICD-10-CM | POA: Insufficient documentation

## 2023-04-19 DIAGNOSIS — Z5181 Encounter for therapeutic drug level monitoring: Secondary | ICD-10-CM | POA: Diagnosis not present

## 2023-04-19 DIAGNOSIS — G8929 Other chronic pain: Secondary | ICD-10-CM | POA: Diagnosis not present

## 2023-04-19 DIAGNOSIS — R202 Paresthesia of skin: Secondary | ICD-10-CM | POA: Diagnosis not present

## 2023-04-19 MED ORDER — HYDROCODONE-ACETAMINOPHEN 7.5-325 MG PO TABS: ORAL_TABLET | ORAL | 0 refills | Status: DC

## 2023-04-19 MED ORDER — TIZANIDINE HCL 4 MG PO TABS: 4.0000 mg | ORAL_TABLET | Freq: Three times a day (TID) | ORAL | 3 refills | Status: DC

## 2023-04-19 MED ORDER — PREGABALIN 100 MG PO CAPS: 100.0000 mg | ORAL_CAPSULE | Freq: Three times a day (TID) | ORAL | 3 refills | Status: DC
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
## 2023-05-02 ENCOUNTER — Other Ambulatory Visit: Payer: Self-pay | Admitting: Registered Nurse

## 2023-05-08 ENCOUNTER — Encounter: Payer: Medicare Other | Admitting: Licensed Clinical Social Worker

## 2023-05-15 ENCOUNTER — Ambulatory Visit: Payer: Self-pay | Admitting: Licensed Clinical Social Worker

## 2023-05-15 NOTE — Patient Outreach (Signed)
  Care Coordination   05/15/2023 Name: Maria Griffith MRN: [id number] DOB: [birth year]   Care Coordination Outreach Attempts:  An unsuccessful telephone outreach was attempted today to offer the patient information about available care coordination services.  Follow Up Plan:  Additional outreach attempts will be made to offer the patient care coordination information and services.   Encounter Outcome:  No Answer   Care Coordination Interventions:  No, not indicated    Kelton Pillar.Taletha Twiford MSW, LCSW Licensed Visual merchandiser [facility] Care Management [phone]

## 2023-05-15 NOTE — Patient Instructions (Signed)
Visit Information  Thank you for taking time to visit with me today. Please don't hesitate to contact me if I can be of assistance to you.   Following are the goals we discussed today:   Goals Addressed             This Visit's Progress    patient has walking challenges. she uses a cane to help her walk       Interventions:  LCSW spoke via phone today with Lavonda Jumbo, daughter of client, about client needs Trinicia said client was generally doing well. She said client uses a cane to help her walk Discussed medication procurement for client.  Client has prescribed medications Trinicia said she and client live in different households; so Tenny Craw was not always sure of some details related to client daily functioning. Discussed program support for client with RN, LCSW and Pharmacist.  Peggyann Shoals for call. Encouraged Lavonda Jumbo or client to call LCSW as needed for SW support for client at [phone]          Our next appointment is by telephone on 07/10/23 at 10:00 AM   Please call the care guide team at [phone] if you need to cancel or reschedule your appointment.   If you are experiencing a Mental Health or Behavioral Health Crisis or need someone to talk to, please go to [facility] [address], [place] [phone])   The patient / Leeonna Pusateri, daughter, verbalized understanding of instructions, educational materials, and care plan provided today and DECLINED offer to receive copy of patient instructions, educational materials, and care plan.   The patient / Audrena Heydon, daughter, has been provided with contact information for the care management team and has been advised to call with any health related questions or concerns.   Kelton Pillar.Candida Vetter MSW, LCSW Licensed Visual merchandiser [facility] Care Management [phone]

## 2023-05-15 NOTE — Patient Outreach (Signed)
  Care Coordination   Follow Up Visit Note   05/15/2023 Name: DANNETTA TILGHMAN MRN: [id number] DOB: May 07, 1961  SHWETHA MARUCCI is a 62 y.o. year old female who sees Doreene Eland, MD for primary care. I spoke with  [name] / Lavonda Jumbo, daughter of client, via phone today about client needs by phone today.  What matters to the patients health and wellness today?  Patient has walking challenges She uses a cane to help her walk    Goals Addressed             This Visit's Progress    patient has walking challenges. she uses a cane to help her walk       Interventions:  LCSW spoke via phone today with Lavonda Jumbo, daughter of client, about client needs Trinicia said client was generally doing well. She said client uses a cane to help her walk Discussed medication procurement for client.  Client has prescribed medications Trinicia said she and client live in different households; so Tenny Craw was not always sure of some details related to client daily functioning. Discussed program support for client with RN, LCSW and Pharmacist.  Peggyann Shoals for call. Encouraged Lavonda Jumbo or client to call LCSW as needed for SW support for client at [phone]          SDOH assessments and interventions completed:  Yes  SDOH Interventions Today    Flowsheet Row Most Recent Value  SDOH Interventions   Depression Interventions/Treatment  Counseling  Physical Activity Interventions Other (Comments)  [walking challenges. she uses a cane to help her walk]  Stress Interventions Other (Comment)  [client has walking challenges. She has some stress related to managing medical needs]        Care Coordination Interventions:  Yes, provided   Interventions Today    Flowsheet Row Most Recent Value  Chronic Disease   Chronic disease during today's visit Other  [LCSW spoke via phone with Lavonda Jumbo, daughter of client, about client needs]  General  Interventions   General Interventions Discussed/Reviewed General Interventions Discussed, Community Resources  [discussed program resources for client]  Exercise Interventions   Exercise Discussed/Reviewed Physical Activity  [client uses a cane to help her ambulate]  Education Interventions   Education Provided Provided Education  Provided Engineer, [employer] program support with RN, LCSW, and Pharmacist]  Mental Health Interventions   Mental Health Discussed/Reviewed Anxiety, Coping Strategies  [Trinicia said client was doing pretty well all in all. Trinicia did not mention any mood issues of client]  Pharmacy Interventions   Pharmacy Dicussed/Reviewed Pharmacy Topics Discussed        Follow up plan: Follow up call scheduled for 07/10/23 at 10:00 AM    Encounter Outcome:  Pt. Visit Completed   Kelton Pillar.Giannie Soliday MSW, LCSW Licensed Visual merchandiser [facility] Care Management [phone]

## 2023-05-17 ENCOUNTER — Other Ambulatory Visit: Payer: Self-pay | Admitting: Cardiovascular Disease

## 2023-05-18 ENCOUNTER — Encounter: Payer: Medicare Other | Admitting: Registered Nurse

## 2023-05-18 ENCOUNTER — Telehealth: Payer: Self-pay | Admitting: Registered Nurse

## 2023-05-18 MED ORDER — HYDROCODONE-ACETAMINOPHEN 7.5-325 MG PO TABS: ORAL_TABLET | ORAL | 0 refills | Status: DC

## 2023-05-18 NOTE — Telephone Encounter (Signed)
Patient had an appointment today. She is sick and unable to come. She has medication for today only. Can you send in refill for hydrocodone.

## 2023-05-18 NOTE — Telephone Encounter (Signed)
PMP was Reviewed.  Hydrocodone e-scribed to pharmacy Call placed to Ms. Arita Miss, no answer, left message to return the call.

## 2023-05-30 ENCOUNTER — Ambulatory Visit: Payer: Medicare Other | Admitting: Physical Therapy

## 2023-06-05 ENCOUNTER — Encounter: Payer: Medicare Other | Attending: Registered Nurse | Admitting: Registered Nurse

## 2023-06-05 ENCOUNTER — Encounter: Payer: Self-pay | Admitting: Registered Nurse

## 2023-06-05 VITALS — BP 111/69 | HR 61 | Ht 61.0 in | Wt 174.6 lb

## 2023-06-05 DIAGNOSIS — M5416 Radiculopathy, lumbar region: Secondary | ICD-10-CM | POA: Diagnosis not present

## 2023-06-05 DIAGNOSIS — M5412 Radiculopathy, cervical region: Secondary | ICD-10-CM | POA: Diagnosis not present

## 2023-06-05 DIAGNOSIS — G4709 Other insomnia: Secondary | ICD-10-CM | POA: Insufficient documentation

## 2023-06-05 DIAGNOSIS — M7918 Myalgia, other site: Secondary | ICD-10-CM | POA: Insufficient documentation

## 2023-06-05 DIAGNOSIS — M542 Cervicalgia: Secondary | ICD-10-CM | POA: Diagnosis not present

## 2023-06-05 DIAGNOSIS — Z5181 Encounter for therapeutic drug level monitoring: Secondary | ICD-10-CM | POA: Diagnosis not present

## 2023-06-05 DIAGNOSIS — G8929 Other chronic pain: Secondary | ICD-10-CM | POA: Diagnosis not present

## 2023-06-05 DIAGNOSIS — R202 Paresthesia of skin: Secondary | ICD-10-CM | POA: Insufficient documentation

## 2023-06-05 DIAGNOSIS — G894 Chronic pain syndrome: Secondary | ICD-10-CM | POA: Insufficient documentation

## 2023-06-05 DIAGNOSIS — M546 Pain in thoracic spine: Secondary | ICD-10-CM | POA: Diagnosis not present

## 2023-06-05 DIAGNOSIS — Z79891 Long term (current) use of opiate analgesic: Secondary | ICD-10-CM | POA: Diagnosis not present

## 2023-06-05 MED ORDER — HYDROCODONE-ACETAMINOPHEN 7.5-325 MG PO TABS: ORAL_TABLET | ORAL | 0 refills | Status: DC

## 2023-06-05 MED ORDER — PREGABALIN 100 MG PO CAPS: 100.0000 mg | ORAL_CAPSULE | Freq: Three times a day (TID) | ORAL | 3 refills | Status: DC
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
## 2023-06-08 ENCOUNTER — Ambulatory Visit: Payer: Medicare Other | Attending: Family Medicine

## 2023-06-08 DIAGNOSIS — R296 Repeated falls: Secondary | ICD-10-CM | POA: Diagnosis not present

## 2023-06-08 DIAGNOSIS — G959 Disease of spinal cord, unspecified: Secondary | ICD-10-CM | POA: Insufficient documentation

## 2023-06-08 DIAGNOSIS — M5442 Lumbago with sciatica, left side: Secondary | ICD-10-CM | POA: Diagnosis not present

## 2023-06-08 DIAGNOSIS — M5441 Lumbago with sciatica, right side: Secondary | ICD-10-CM | POA: Diagnosis not present

## 2023-06-08 DIAGNOSIS — G8929 Other chronic pain: Secondary | ICD-10-CM | POA: Insufficient documentation

## 2023-06-08 DIAGNOSIS — R2689 Other abnormalities of gait and mobility: Secondary | ICD-10-CM | POA: Insufficient documentation

## 2023-06-08 DIAGNOSIS — M542 Cervicalgia: Secondary | ICD-10-CM | POA: Diagnosis not present
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
## 2023-06-08 NOTE — Patient Instructions (Addendum)
  Aquatic Therapy: What to Expect!  Where:  [facility] [address] [place], [place]  [address] [phone]  NOTE:  You will receive an automated phone message reminding you of your appointment and it will say the appointment is at the Rehab Center on [address]  We are working to fix this- just know that you will meet us at the pool!  How to Prepare: Please make sure you drink 8 ounces of water about one hour prior to your pool session A caregiver MUST attend the entire session with the patient.  The caregiver will be responsible for assisting with dressing as well as any toileting needs.  If the patient will be doing a home program this should likely be the person who will assist as well.  Patients must wear either their street shoes or pool shoes until they are ready to enter the pool with the therapist.  Patients must also wear either street shoes or pool shoes once exiting the pool to walk to the locker room.  This will helps us prevent slips and falls.  Please arrive 15 minutes early to prepare for your pool therapy session Sign in at the front desk on the clipboard marked for [place] You may use the locker rooms on your right and then enter directly into the recreation pool (NOT the competition pool) Please make sure to attend to any toileting needs prior to entering the pool Please be dressed in your swim suit and on the pool deck at least 5 minutes before your appointment Once on the pool deck your therapist will ask you to sign the Patient  Consent and Assignment of Benefits form Your therapist may take your blood pressure prior to, during and after your session if indicated  About the pool  and parking: Entering the pool Your therapist will assist you; there are 2 ways to enter:  stairs with railings or with a chair lift.   Your therapist will determine the most appropriate way for you. Water temperature is usually between 86-87 degrees There  may be other swimmers in the pool at the same time Parking is free.   Contact Info:     Appointments: [facility]  All sessions are 45 minutes   [address]  [address]     Please call the [facility] if   [place], [place]   [phone]    you need to cancel or reschedule an appointment.  [phone]       

## 2023-06-15 ENCOUNTER — Ambulatory Visit: Payer: Medicare Other | Admitting: Physical Therapy

## 2023-06-26 ENCOUNTER — Ambulatory Visit: Payer: Medicare Other | Attending: Family Medicine | Admitting: Rehabilitation

## 2023-06-26 ENCOUNTER — Other Ambulatory Visit: Payer: Self-pay | Admitting: Family Medicine

## 2023-06-26 ENCOUNTER — Encounter: Payer: Self-pay | Admitting: Rehabilitation

## 2023-06-26 DIAGNOSIS — R42 Dizziness and giddiness: Secondary | ICD-10-CM | POA: Insufficient documentation

## 2023-06-26 DIAGNOSIS — M542 Cervicalgia: Secondary | ICD-10-CM | POA: Insufficient documentation

## 2023-06-26 DIAGNOSIS — M5441 Lumbago with sciatica, right side: Secondary | ICD-10-CM | POA: Insufficient documentation

## 2023-06-26 DIAGNOSIS — R2689 Other abnormalities of gait and mobility: Secondary | ICD-10-CM | POA: Insufficient documentation

## 2023-06-26 DIAGNOSIS — G8929 Other chronic pain: Secondary | ICD-10-CM | POA: Insufficient documentation

## 2023-06-26 DIAGNOSIS — M5442 Lumbago with sciatica, left side: Secondary | ICD-10-CM | POA: Insufficient documentation
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
## 2023-07-03 ENCOUNTER — Ambulatory Visit: Payer: Medicare Other | Admitting: Rehabilitation

## 2023-07-03 ENCOUNTER — Encounter: Payer: Self-pay | Admitting: Rehabilitation

## 2023-07-03 DIAGNOSIS — G8929 Other chronic pain: Secondary | ICD-10-CM | POA: Diagnosis not present

## 2023-07-03 DIAGNOSIS — R2689 Other abnormalities of gait and mobility: Secondary | ICD-10-CM

## 2023-07-03 DIAGNOSIS — M542 Cervicalgia: Secondary | ICD-10-CM | POA: Diagnosis not present

## 2023-07-03 DIAGNOSIS — R42 Dizziness and giddiness: Secondary | ICD-10-CM | POA: Diagnosis not present

## 2023-07-03 DIAGNOSIS — M5441 Lumbago with sciatica, right side: Secondary | ICD-10-CM | POA: Diagnosis not present

## 2023-07-03 DIAGNOSIS — M5442 Lumbago with sciatica, left side: Secondary | ICD-10-CM | POA: Diagnosis not present
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
## 2023-07-04 NOTE — Addendum Note (Signed)
Addended by: [name] on: 07/04/2023 08:43 AM   Modules accepted: Orders

## 2023-07-10 ENCOUNTER — Encounter: Payer: Medicare Other | Attending: Registered Nurse | Admitting: Registered Nurse

## 2023-07-10 ENCOUNTER — Ambulatory Visit: Payer: Self-pay | Admitting: Licensed Clinical Social Worker

## 2023-07-10 DIAGNOSIS — M7918 Myalgia, other site: Secondary | ICD-10-CM | POA: Insufficient documentation

## 2023-07-10 DIAGNOSIS — G8929 Other chronic pain: Secondary | ICD-10-CM | POA: Insufficient documentation

## 2023-07-10 DIAGNOSIS — M542 Cervicalgia: Secondary | ICD-10-CM | POA: Insufficient documentation

## 2023-07-10 DIAGNOSIS — G894 Chronic pain syndrome: Secondary | ICD-10-CM | POA: Insufficient documentation

## 2023-07-10 DIAGNOSIS — M5412 Radiculopathy, cervical region: Secondary | ICD-10-CM | POA: Insufficient documentation

## 2023-07-10 DIAGNOSIS — Z5181 Encounter for therapeutic drug level monitoring: Secondary | ICD-10-CM | POA: Insufficient documentation

## 2023-07-10 DIAGNOSIS — Z79891 Long term (current) use of opiate analgesic: Secondary | ICD-10-CM | POA: Insufficient documentation

## 2023-07-10 DIAGNOSIS — R202 Paresthesia of skin: Secondary | ICD-10-CM | POA: Insufficient documentation

## 2023-07-10 DIAGNOSIS — G4709 Other insomnia: Secondary | ICD-10-CM | POA: Insufficient documentation

## 2023-07-10 DIAGNOSIS — M5416 Radiculopathy, lumbar region: Secondary | ICD-10-CM | POA: Insufficient documentation

## 2023-07-10 DIAGNOSIS — M546 Pain in thoracic spine: Secondary | ICD-10-CM | POA: Insufficient documentation

## 2023-07-10 NOTE — Patient Instructions (Signed)
Visit Information  Thank you for taking time to visit with me today. Please don't hesitate to contact me if I can be of assistance to you.   Following are the goals we discussed today:   Goals Addressed             This Visit's Progress    patient has walking challenges. she uses a cane to help her walk       Interventions:  LCSW spoke via phone today with client about client needs Discussed family support of client. She has strong family support.   Discussed transport needs. Client drives to and from appointments . Sometimes she has transport help from family members. Family member will sometimes go with client to client appointments Discussed medication procurement of client Discussed walking of client. She said she uses a cane to help her walk. Client is currently receiving aquatic therapy sessions. She said these sessions are very helpful to her. Discussed pain issues of client. Provided counseling support for client Discussed program support for client with RN, LCSW and Pharmacist.  Reviewed sleeping issues of client. Reviewed appetite of client.  Discussed support of neurologist.  Thanked client for phone call with LCSW.   Encouraged client to call LCSW as needed for SW support at [phone].           Our next appointment is by telephone on 08/28/23 at 10:30 AM   Please call the care guide team at [phone] if you need to cancel or reschedule your appointment.   If you are experiencing a Mental Health or Behavioral Health Crisis or need someone to talk to, please go to [facility] [address] [place] [phone])   The patient verbalized understanding of instructions, educational materials, and care plan provided today and DECLINED offer to receive copy of patient instructions, educational materials, and care plan.   The patient has been provided with contact information for the care management team and has been  advised to call with any health related questions or concerns.   Kelton Pillar.Annali Lybrand MSW, LCSW Licensed Visual merchandiser [facility] Care Management [phone]

## 2023-07-10 NOTE — Patient Outreach (Signed)
  Care Coordination   Follow Up Visit Note   07/10/2023 Name: STELA IWASAKI MRN: [id number] DOB: 11-10-1961  MARGUETTA WINDISH is a 62 y.o. year old female who sees Doreene Eland, MD for primary care. I spoke with  [name] by phone today.  What matters to the patients health and wellness today?  Patient has walking challenges. She uses a cane to help her walk    Goals Addressed             This Visit's Progress    patient has walking challenges. she uses a cane to help her walk       Interventions:  LCSW spoke via phone today with client about client needs Discussed family support of client. She has strong family support.   Discussed transport needs. Client drives to and from appointments . Sometimes she has transport help from family members. Family member will sometimes go with client to client appointments Discussed medication procurement of client Discussed walking of client. She said she uses a cane to help her walk. Client is currently receiving aquatic therapy sessions. She said these sessions are very helpful to her. Discussed pain issues of client. Provided counseling support for client Discussed program support for client with RN, LCSW and Pharmacist.  Reviewed sleeping issues of client. Reviewed appetite of client.  Discussed support of neurologist.  Thanked client for phone call with LCSW.   Encouraged client to call LCSW as needed for SW support at [phone].           SDOH assessments and interventions completed:  Yes  SDOH Interventions Today    Flowsheet Row Most Recent Value  SDOH Interventions   Depression Interventions/Treatment  Counseling  Physical Activity Interventions Other (Comments)  [receives aquatic therapy as scheduled]  Stress Interventions Provide Counseling  [has stress in managing medical needs]        Care Coordination Interventions:  Yes, provided    Interventions Today    Flowsheet Row Most Recent Value   Chronic Disease   Chronic disease during today's visit Other  [spoke with client about client needs]  General Interventions   General Interventions Discussed/Reviewed General Interventions Discussed, Community Resources  [discused program support]  Exercise Interventions   Exercise Discussed/Reviewed Physical Activity  Education Interventions   Education Provided Provided Education  Provided Verbal Education On Community Resources  Mental Health Interventions   Mental Health Discussed/Reviewed Coping Strategies  [no mood issues noted she has strong family support]  Nutrition Interventions   Nutrition Discussed/Reviewed Nutrition Discussed  Pharmacy Interventions   Pharmacy Dicussed/Reviewed Pharmacy Topics Discussed  Safety Interventions   Safety Discussed/Reviewed Fall Risk       Follow up plan: Follow up call scheduled for 08/28/23 at 10:30 AM    Encounter Outcome:  Pt. Visit Completed   Kelton Pillar.Raina Sole MSW, LCSW Licensed Visual merchandiser [facility] Care Management [phone]

## 2023-07-11 ENCOUNTER — Encounter: Payer: Self-pay | Admitting: Physical Therapy

## 2023-07-11 ENCOUNTER — Ambulatory Visit: Payer: Medicare Other | Admitting: Physical Therapy

## 2023-07-11 VITALS — BP 132/72 | HR 59

## 2023-07-11 DIAGNOSIS — M5442 Lumbago with sciatica, left side: Secondary | ICD-10-CM | POA: Diagnosis not present

## 2023-07-11 DIAGNOSIS — M542 Cervicalgia: Secondary | ICD-10-CM | POA: Diagnosis not present

## 2023-07-11 DIAGNOSIS — R42 Dizziness and giddiness: Secondary | ICD-10-CM

## 2023-07-11 DIAGNOSIS — R2689 Other abnormalities of gait and mobility: Secondary | ICD-10-CM

## 2023-07-11 DIAGNOSIS — G8929 Other chronic pain: Secondary | ICD-10-CM | POA: Diagnosis not present

## 2023-07-11 DIAGNOSIS — M5441 Lumbago with sciatica, right side: Secondary | ICD-10-CM | POA: Diagnosis not present

## 2023-07-11 NOTE — Patient Instructions (Signed)
Gaze Stabilization: Sitting    Keeping eyes on target on wall a few feet away, tilt head down 15-30 and move head side to side for __20_ seconds.  Perform 3 sets.  Do ___2-3 _ sessions per day. Use a plain background   Do not let dizziness get above a 4/10!  Gaze Stabilization: Tip Card  1.Target must remain in focus, not blurry, and appear stationary while head is in motion. 2.Perform exercises with small head movements (45 to either side of midline). 3.Increase speed of head motion so long as target is in focus. 4.If you wear eyeglasses, be sure you can see target through lens (therapist will give specific instructions for bifocal / progressive lenses). 5.These exercises may provoke dizziness or nausea. Work through these symptoms. If too dizzy, slow head movement slightly. Rest between each exercise. 6.Exercises demand concentration; avoid distractions. 7.For safety, perform standing exercises close to a counter, wall, corner, or next to someone.  Copyright  VHI. All rights reserved.
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
## 2023-07-12 ENCOUNTER — Encounter: Payer: Medicare Other | Admitting: Registered Nurse

## 2023-07-12 ENCOUNTER — Encounter: Payer: Self-pay | Admitting: Registered Nurse

## 2023-07-12 VITALS — BP 137/83 | HR 57 | Ht 61.0 in | Wt 174.0 lb

## 2023-07-12 DIAGNOSIS — Z5181 Encounter for therapeutic drug level monitoring: Secondary | ICD-10-CM

## 2023-07-12 DIAGNOSIS — R202 Paresthesia of skin: Secondary | ICD-10-CM | POA: Diagnosis not present

## 2023-07-12 DIAGNOSIS — G4709 Other insomnia: Secondary | ICD-10-CM

## 2023-07-12 DIAGNOSIS — M5412 Radiculopathy, cervical region: Secondary | ICD-10-CM | POA: Diagnosis not present

## 2023-07-12 DIAGNOSIS — M5416 Radiculopathy, lumbar region: Secondary | ICD-10-CM

## 2023-07-12 DIAGNOSIS — Z79891 Long term (current) use of opiate analgesic: Secondary | ICD-10-CM | POA: Diagnosis not present

## 2023-07-12 DIAGNOSIS — M7918 Myalgia, other site: Secondary | ICD-10-CM | POA: Diagnosis not present

## 2023-07-12 DIAGNOSIS — M542 Cervicalgia: Secondary | ICD-10-CM

## 2023-07-12 DIAGNOSIS — M546 Pain in thoracic spine: Secondary | ICD-10-CM

## 2023-07-12 DIAGNOSIS — G8929 Other chronic pain: Secondary | ICD-10-CM

## 2023-07-12 DIAGNOSIS — G894 Chronic pain syndrome: Secondary | ICD-10-CM

## 2023-07-12 MED ORDER — HYDROCODONE-ACETAMINOPHEN 7.5-325 MG PO TABS: ORAL_TABLET | ORAL | 0 refills | Status: DC
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
## 2023-07-16 ENCOUNTER — Ambulatory Visit: Payer: Medicare Other | Admitting: Physical Therapy

## 2023-07-16 DIAGNOSIS — R42 Dizziness and giddiness: Secondary | ICD-10-CM

## 2023-07-16 NOTE — Therapy (Signed)
[facility] [address] [place], [place], [address] Phone: [phone]   Fax:  [phone]  Patient Details  Name: Maria Griffith MRN: [id number] Date of Birth: [birth year] Referring Provider:  Doreene Eland, MD  Encounter Date: 07/16/2023  Arrived - No Charge  Pt arrived late to session and drove herself to therapy for further vestibular assessment. Last time PT started vestibular assessment, pt got nauseous and dizziness and had a ride. Discussed that would want pt to have a ride to session due to symptoms last time and to use full time for assessment. Re-scheduled pt and cancelled pool session tomorrow as pt won't be able to tolerate it due to symptoms. Pt in agreement with plan.    Drake Leach, PT,DPT 07/16/2023, 1:11 PM  [place] [facility] [address] [place], [place], [address] Phone: [phone]   Fax:  [phone]

## 2023-07-17 ENCOUNTER — Ambulatory Visit: Payer: Medicare Other | Admitting: Rehabilitation

## 2023-07-18 ENCOUNTER — Ambulatory Visit: Payer: Medicare Other | Admitting: Physical Therapy

## 2023-07-18 ENCOUNTER — Other Ambulatory Visit: Payer: Self-pay | Admitting: Registered Nurse

## 2023-07-18 ENCOUNTER — Encounter: Payer: Self-pay | Admitting: Physical Therapy

## 2023-07-18 VITALS — BP 153/85 | HR 55

## 2023-07-18 DIAGNOSIS — R2689 Other abnormalities of gait and mobility: Secondary | ICD-10-CM | POA: Diagnosis not present

## 2023-07-18 DIAGNOSIS — M5441 Lumbago with sciatica, right side: Secondary | ICD-10-CM | POA: Diagnosis not present

## 2023-07-18 DIAGNOSIS — G8929 Other chronic pain: Secondary | ICD-10-CM | POA: Diagnosis not present

## 2023-07-18 DIAGNOSIS — M542 Cervicalgia: Secondary | ICD-10-CM | POA: Diagnosis not present

## 2023-07-18 DIAGNOSIS — R42 Dizziness and giddiness: Secondary | ICD-10-CM

## 2023-07-18 DIAGNOSIS — M5442 Lumbago with sciatica, left side: Secondary | ICD-10-CM | POA: Diagnosis not present

## 2023-07-18 NOTE — Patient Instructions (Signed)
Gaze Stabilization: Sitting    Keeping eyes on target on wall a few feet away, tilt head down 15-30 and move head side to side for __20__ seconds.  Repeat while moving head up and down for __20__ seconds. Don't let symptoms go above a 5/10 dizziness   Perform 2-3 sets of each.  Do __2__ sessions per day.   Gaze Stabilization: Tip Card  1.Target must remain in focus, not blurry, and appear stationary while head is in motion. 2.Perform exercises with small head movements (45 to either side of midline). 3.These exercises may provoke dizziness or nausea. Work through these symptoms. If too dizzy, slow head movement slightly. Rest between each exercise. 4.Exercises demand concentration; avoid distractions.        Copyright  VHI. All rights reserved.
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
## 2023-07-24 ENCOUNTER — Ambulatory Visit: Payer: Medicare Other | Admitting: Rehabilitation

## 2023-07-31 ENCOUNTER — Ambulatory Visit: Payer: Medicare Other | Admitting: Rehabilitation

## 2023-08-02 ENCOUNTER — Ambulatory Visit: Payer: Medicare Other | Attending: Family Medicine | Admitting: Physical Therapy

## 2023-08-02 DIAGNOSIS — R42 Dizziness and giddiness: Secondary | ICD-10-CM | POA: Diagnosis not present

## 2023-08-02 DIAGNOSIS — M5441 Lumbago with sciatica, right side: Secondary | ICD-10-CM | POA: Insufficient documentation

## 2023-08-02 DIAGNOSIS — M542 Cervicalgia: Secondary | ICD-10-CM | POA: Insufficient documentation

## 2023-08-02 DIAGNOSIS — G8929 Other chronic pain: Secondary | ICD-10-CM | POA: Diagnosis not present

## 2023-08-02 DIAGNOSIS — R2689 Other abnormalities of gait and mobility: Secondary | ICD-10-CM

## 2023-08-02 DIAGNOSIS — G959 Disease of spinal cord, unspecified: Secondary | ICD-10-CM

## 2023-08-02 DIAGNOSIS — M5442 Lumbago with sciatica, left side: Secondary | ICD-10-CM | POA: Diagnosis not present

## 2023-08-06 ENCOUNTER — Encounter: Payer: Self-pay | Admitting: Physical Therapy
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
## 2023-08-07 ENCOUNTER — Ambulatory Visit: Payer: Medicare Other | Admitting: Rehabilitation

## 2023-08-07 ENCOUNTER — Encounter: Payer: Self-pay | Admitting: Rehabilitation
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
## 2023-08-14 ENCOUNTER — Other Ambulatory Visit: Payer: Self-pay | Admitting: Cardiovascular Disease

## 2023-08-14 ENCOUNTER — Encounter: Payer: Self-pay | Admitting: Rehabilitation

## 2023-08-14 ENCOUNTER — Ambulatory Visit: Payer: Medicare Other | Admitting: Rehabilitation

## 2023-08-14 DIAGNOSIS — R42 Dizziness and giddiness: Secondary | ICD-10-CM | POA: Diagnosis not present

## 2023-08-14 DIAGNOSIS — M542 Cervicalgia: Secondary | ICD-10-CM | POA: Diagnosis not present

## 2023-08-14 DIAGNOSIS — G8929 Other chronic pain: Secondary | ICD-10-CM | POA: Diagnosis not present

## 2023-08-14 DIAGNOSIS — R2689 Other abnormalities of gait and mobility: Secondary | ICD-10-CM | POA: Diagnosis not present

## 2023-08-14 DIAGNOSIS — G959 Disease of spinal cord, unspecified: Secondary | ICD-10-CM | POA: Diagnosis not present

## 2023-08-14 DIAGNOSIS — M5441 Lumbago with sciatica, right side: Secondary | ICD-10-CM | POA: Diagnosis not present

## 2023-08-14 DIAGNOSIS — M5442 Lumbago with sciatica, left side: Secondary | ICD-10-CM | POA: Diagnosis not present
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
## 2023-08-15 ENCOUNTER — Other Ambulatory Visit: Payer: Self-pay | Admitting: Registered Nurse

## 2023-08-15 ENCOUNTER — Other Ambulatory Visit: Payer: Self-pay | Admitting: Cardiovascular Disease

## 2023-08-16 ENCOUNTER — Encounter: Payer: Medicare Other | Attending: Registered Nurse | Admitting: Registered Nurse

## 2023-08-16 ENCOUNTER — Encounter: Payer: Self-pay | Admitting: Registered Nurse

## 2023-08-16 VITALS — BP 126/77 | HR 66 | Ht 61.0 in | Wt 172.0 lb

## 2023-08-16 DIAGNOSIS — Z5181 Encounter for therapeutic drug level monitoring: Secondary | ICD-10-CM | POA: Diagnosis not present

## 2023-08-16 DIAGNOSIS — M5412 Radiculopathy, cervical region: Secondary | ICD-10-CM | POA: Insufficient documentation

## 2023-08-16 DIAGNOSIS — Z79891 Long term (current) use of opiate analgesic: Secondary | ICD-10-CM | POA: Insufficient documentation

## 2023-08-16 DIAGNOSIS — M5416 Radiculopathy, lumbar region: Secondary | ICD-10-CM | POA: Insufficient documentation

## 2023-08-16 DIAGNOSIS — M542 Cervicalgia: Secondary | ICD-10-CM | POA: Insufficient documentation

## 2023-08-16 DIAGNOSIS — G8929 Other chronic pain: Secondary | ICD-10-CM | POA: Diagnosis not present

## 2023-08-16 DIAGNOSIS — G894 Chronic pain syndrome: Secondary | ICD-10-CM | POA: Insufficient documentation

## 2023-08-16 DIAGNOSIS — M7918 Myalgia, other site: Secondary | ICD-10-CM | POA: Diagnosis not present

## 2023-08-16 DIAGNOSIS — R202 Paresthesia of skin: Secondary | ICD-10-CM | POA: Diagnosis not present

## 2023-08-16 DIAGNOSIS — G4709 Other insomnia: Secondary | ICD-10-CM | POA: Insufficient documentation

## 2023-08-16 DIAGNOSIS — M546 Pain in thoracic spine: Secondary | ICD-10-CM | POA: Insufficient documentation

## 2023-08-16 MED ORDER — HYDROCODONE-ACETAMINOPHEN 7.5-325 MG PO TABS: ORAL_TABLET | ORAL | 0 refills | Status: DC
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
## 2023-08-21 ENCOUNTER — Ambulatory Visit: Payer: Medicare Other | Attending: Family Medicine

## 2023-08-21 VITALS — BP 146/71

## 2023-08-21 DIAGNOSIS — M5441 Lumbago with sciatica, right side: Secondary | ICD-10-CM | POA: Insufficient documentation

## 2023-08-21 DIAGNOSIS — G8929 Other chronic pain: Secondary | ICD-10-CM | POA: Diagnosis not present

## 2023-08-21 DIAGNOSIS — R42 Dizziness and giddiness: Secondary | ICD-10-CM | POA: Diagnosis not present

## 2023-08-21 DIAGNOSIS — M5442 Lumbago with sciatica, left side: Secondary | ICD-10-CM | POA: Insufficient documentation

## 2023-08-21 DIAGNOSIS — R2689 Other abnormalities of gait and mobility: Secondary | ICD-10-CM | POA: Insufficient documentation

## 2023-08-21 DIAGNOSIS — M542 Cervicalgia: Secondary | ICD-10-CM | POA: Insufficient documentation

## 2023-08-21 DIAGNOSIS — G959 Disease of spinal cord, unspecified: Secondary | ICD-10-CM | POA: Diagnosis not present
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
## 2023-08-22 ENCOUNTER — Ambulatory Visit: Payer: Medicare Other

## 2023-08-22 ENCOUNTER — Other Ambulatory Visit: Payer: Self-pay | Admitting: Family Medicine

## 2023-08-23 ENCOUNTER — Other Ambulatory Visit: Payer: Self-pay | Admitting: Registered Nurse

## 2023-08-24 LAB — TOXASSURE SELECT,+ANTIDEPR,UR

## 2023-08-28 ENCOUNTER — Ambulatory Visit: Payer: Medicare Other

## 2023-08-28 ENCOUNTER — Ambulatory Visit: Payer: Self-pay | Admitting: Licensed Clinical Social Worker

## 2023-08-28 NOTE — Patient Instructions (Signed)
Visit Information  Thank you for taking time to visit with me today. Please don't hesitate to contact me if I can be of assistance to you.   Following are the goals we discussed today:   Goals Addressed             This Visit's Progress    patient has walking challenges. she uses a cane to help her walk       Interventions:  LCSW spoke via phone today with client about client needs Client said she is doing fairly well. She did not mention any particular problems today. Client has said previously that she has strong family support. She has said previously that she drives to her appointments as needed. She has said that she sometimes uses a cane to help her walk Client asked if she could talk with LCSW at a later time. Client has LCSW name and phone number and will call LCSW back at a later time to talk further with LCSW about her SW needs.  LCSW thanked client for phone call today with LCSW        Client to call LCSW at a later time to discuss social work needs of client   Please call the care guide team at [phone] if you need to cancel or reschedule your appointment.   If you are experiencing a Mental Health or Behavioral Health Crisis or need someone to talk to, please go to [facility] [address] [place] [phone])   The patient verbalized understanding of instructions, educational materials, and care plan provided today and DECLINED offer to receive copy of patient instructions, educational materials, and care plan.   The patient has been provided with contact information for the care management team and has been advised to call with any health related questions or concerns.   Kelton Pillar.Eulogia Dismore MSW, LCSW Licensed Visual merchandiser [facility] Care Management [phone]

## 2023-08-28 NOTE — Patient Outreach (Signed)
  Care Coordination   Follow Up Visit Note   08/28/2023 Name: Maria Griffith MRN: [id number] DOB: 11-Oct-1961  Maria Griffith is a 62 y.o. year old female who sees Doreene Eland, MD for primary care. I spoke with  Maria Griffith by phone today.  What matters to the patients health and wellness today?  Patient has walking challenges. She uses a cane to help her walk     Goals Addressed             This Visit's Progress    patient has walking challenges. she uses a cane to help her walk       Interventions:  LCSW spoke via phone today with client about client needs Client said she is doing fairly well. She did not mention any particular problems today. Client has said previously that she has strong family support. She has said previously that she drives to her appointments as needed. She has said that she sometimes uses a cane to help her walk Client asked if she could talk with LCSW at a later time. Client has LCSW name and phone number and will call LCSW back at a later time to talk further with LCSW about her SW needs.  LCSW thanked client for phone call today with LCSW          SDOH assessments and interventions completed:  Yes  SDOH Interventions Today    Flowsheet Row Most Recent Value  SDOH Interventions   Depression Interventions/Treatment  Counseling  Physical Activity Interventions Other (Comments)  [occasional mobility challenges]  Stress Interventions Provide Counseling        Care Coordination Interventions:  Yes, provided   Interventions Today    Flowsheet Row Most Recent Value  Chronic Disease   Chronic disease during today's visit Other  [spoke with client about client needs]  General Interventions   General Interventions Discussed/Reviewed General Interventions Discussed, [employer] program resources with RN, LCSW, Pharmacist]  Exercise Interventions   Exercise Discussed/Reviewed Physical Activity  [walking challenges.  Sometimes she uses a cane to help her walk]  Education Interventions   Education Provided Provided Education  Provided Verbal Education On [employer]  Mental Health Interventions   Mental Health Discussed/Reviewed Coping Strategies  [client has history of MDD and PTSD. She has said previously that she has strong family support]        Follow up plan: Client to call LCSW at a later time to discuss social work needs of client.  Client has LCSW phone number    Encounter Outcome:  Patient Visit Completed   Kelton Pillar.Alfie Rideaux MSW, LCSW Licensed Visual merchandiser [facility] Care Management [phone]

## 2023-09-03 ENCOUNTER — Telehealth: Payer: Self-pay

## 2023-09-03 MED ORDER — POLYETHYLENE GLYCOL 3350 17 GM/SCOOP PO POWD: 1.0000 | ORAL | 3 refills | Status: DC | PRN

## 2023-09-03 MED ORDER — LIDOCAINE 5 % EX PTCH: MEDICATED_PATCH | CUTANEOUS | 1 refills | Status: DC

## 2023-09-03 MED ORDER — TRAZODONE HCL 150 MG PO TABS: 150.0000 mg | ORAL_TABLET | Freq: Every day | ORAL | 3 refills | Status: DC

## 2023-09-03 NOTE — Telephone Encounter (Signed)
Refill request

## 2023-09-04 ENCOUNTER — Telehealth: Payer: Self-pay | Admitting: Pharmacist

## 2023-09-04 NOTE — Telephone Encounter (Signed)
Completed call to patient for follow-up of blood pressure/hypertension control.   Scheduled appointment for 09/12/23.  Total time with patient call and documentation of interaction: 3 minutes.

## 2023-09-10 ENCOUNTER — Ambulatory Visit: Payer: Medicare Other | Admitting: Registered Nurse

## 2023-09-12 ENCOUNTER — Ambulatory Visit: Payer: Medicare Other | Admitting: Pharmacist

## 2023-09-19 ENCOUNTER — Other Ambulatory Visit: Payer: Self-pay | Admitting: Cardiovascular Disease

## 2023-09-20 ENCOUNTER — Ambulatory Visit: Payer: Medicare Other | Attending: Family Medicine | Admitting: Physical Therapy

## 2023-09-20 ENCOUNTER — Other Ambulatory Visit: Payer: Self-pay | Admitting: Registered Nurse

## 2023-09-20 DIAGNOSIS — R2689 Other abnormalities of gait and mobility: Secondary | ICD-10-CM | POA: Insufficient documentation

## 2023-09-24 ENCOUNTER — Telehealth: Payer: Self-pay

## 2023-09-24 NOTE — Telephone Encounter (Signed)
Patient Name: Maria Griffith MRN: [id number] DOB:Apr 22, 1961, 62 y.o., female Today's Date: 09/24/2023  Called and spoke to patient to remind her of her aquatic therapy session which is scheduled for 09/25/23 at 11:45 at [facility]. Patient was reminded that she has exceeded >3 cancellations/no shows since the start of her PT episode and one more no show can result in discharge from skilled PT. Pt verbalized understanding and reported that she will be at pool therapy.   [name], PT 09/24/2023, 3:54 PM

## 2023-09-25 ENCOUNTER — Ambulatory Visit: Payer: Medicare Other | Admitting: Rehabilitation

## 2023-09-25 NOTE — Therapy (Signed)
[facility] [address] [place], [place], [address] Phone: [phone]   Fax:  [phone]  Patient Details  Name: TANISHA LUTES MRN: [id number] Date of Birth: 1961/06/06 Referring Provider:  Doreene Eland, MD  Encounter Date: 09/25/2023   Pt arrived to session 15 mins late.  She reports not feeling well but knows she "needs to or her appts will be cancelled."  Had a lengthy discussion with patient regarding therapy and needing to be consistent in order to see results.  She has not been seen in over a month.  She reports she is still having some dizziness despite doing exercises given in clinic.  PT highly recommends she makes appt with her cardiologist in order to discuss.  She verbalizes understanding.  Discussed that we can add vestibular rehab to pool exercises, but again she has to be consistent, otherwise there will be no benefit for her.  She reports that she is unable to take pain meds prior to session due to having to drive herself.  Recommended her call our clinic and speak with front desk about possible transportation qualification through her insurance.  Pt verbalized understanding.  Will keep current appts and pt knows to text PT as soon as possible so that her appt time can be replaced with someone from waitlist.  Pt verbalized understanding.    Harriet Butte, PT, MPT [facility] [address] [place], [place], [address] Phone: [phone]   Fax:  [phone] 09/25/23, 12:27 PM      Harriet Butte, PT, MPT [facility] [address] [place], [place], [address] Phone: [phone]   Fax:  [phone] 09/25/23, 12:24 PM

## 2023-09-26 ENCOUNTER — Other Ambulatory Visit: Payer: Self-pay | Admitting: Registered Nurse

## 2023-09-26 ENCOUNTER — Other Ambulatory Visit: Payer: Self-pay | Admitting: Family Medicine

## 2023-09-26 ENCOUNTER — Other Ambulatory Visit: Payer: Self-pay | Admitting: Cardiovascular Disease

## 2023-09-26 DIAGNOSIS — R110 Nausea: Secondary | ICD-10-CM

## 2023-09-27 ENCOUNTER — Telehealth: Payer: Self-pay | Admitting: Cardiovascular Disease

## 2023-09-27 MED ORDER — METOPROLOL TARTRATE 25 MG PO TABS: 25.0000 mg | ORAL_TABLET | Freq: Two times a day (BID) | ORAL | 1 refills | Status: DC

## 2023-09-27 MED ORDER — LOSARTAN POTASSIUM 50 MG PO TABS: 50.0000 mg | ORAL_TABLET | Freq: Two times a day (BID) | ORAL | 1 refills | Status: DC

## 2023-09-27 NOTE — Telephone Encounter (Signed)
Pt's medications were sent to pt's pharmacy as requested. Confirmation received.  

## 2023-09-27 NOTE — Telephone Encounter (Signed)
*  STAT* If patient is at the pharmacy, call can be transferred to refill team.   1. Which medications need to be refilled? (please list name of each medication and dose if known) losartan (COZAAR) 50 MG tablet  metoprolol tartrate (LOPRESSOR) 25 MG tablet  2. Would you like to learn more about the convenience, safety, & potential cost savings by using the [facility]? No    3. Are you open to using the [facility] No   4. Which pharmacy/location (including street and city if local pharmacy) is medication to be sent to?[facility] - [place], [place] - [address]   5. Do they need a 30 day or 90 day supply? 90 Day Supply  Pt is scheduled for 11/25. Patient is currently out of medication.

## 2023-10-02 ENCOUNTER — Ambulatory Visit: Payer: Medicare Other | Admitting: Rehabilitation

## 2023-10-09 ENCOUNTER — Ambulatory Visit: Payer: Medicare Other | Admitting: Rehabilitation
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
## 2023-10-10 ENCOUNTER — Encounter: Payer: Medicare Other | Admitting: Registered Nurse

## 2023-10-12 ENCOUNTER — Encounter: Payer: Self-pay | Admitting: Registered Nurse

## 2023-10-12 ENCOUNTER — Encounter: Payer: Medicare Other | Attending: Registered Nurse | Admitting: Registered Nurse

## 2023-10-12 VITALS — Ht 61.0 in | Wt 174.0 lb

## 2023-10-12 DIAGNOSIS — G8929 Other chronic pain: Secondary | ICD-10-CM | POA: Insufficient documentation

## 2023-10-12 DIAGNOSIS — M546 Pain in thoracic spine: Secondary | ICD-10-CM | POA: Diagnosis not present

## 2023-10-12 DIAGNOSIS — Z5181 Encounter for therapeutic drug level monitoring: Secondary | ICD-10-CM | POA: Diagnosis not present

## 2023-10-12 DIAGNOSIS — M5416 Radiculopathy, lumbar region: Secondary | ICD-10-CM | POA: Diagnosis not present

## 2023-10-12 DIAGNOSIS — M542 Cervicalgia: Secondary | ICD-10-CM | POA: Diagnosis not present

## 2023-10-12 DIAGNOSIS — Z79891 Long term (current) use of opiate analgesic: Secondary | ICD-10-CM | POA: Insufficient documentation

## 2023-10-12 DIAGNOSIS — M5412 Radiculopathy, cervical region: Secondary | ICD-10-CM | POA: Insufficient documentation

## 2023-10-12 DIAGNOSIS — G4709 Other insomnia: Secondary | ICD-10-CM | POA: Insufficient documentation

## 2023-10-12 DIAGNOSIS — M7918 Myalgia, other site: Secondary | ICD-10-CM | POA: Insufficient documentation

## 2023-10-12 DIAGNOSIS — G894 Chronic pain syndrome: Secondary | ICD-10-CM | POA: Diagnosis not present

## 2023-10-12 DIAGNOSIS — R202 Paresthesia of skin: Secondary | ICD-10-CM | POA: Insufficient documentation

## 2023-10-12 MED ORDER — HYDROCODONE-ACETAMINOPHEN 7.5-325 MG PO TABS: ORAL_TABLET | ORAL | 0 refills | Status: DC
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
## 2023-10-16 ENCOUNTER — Ambulatory Visit: Payer: Medicare Other | Admitting: Rehabilitation

## 2023-10-17 ENCOUNTER — Other Ambulatory Visit: Payer: Self-pay

## 2023-10-17 ENCOUNTER — Encounter: Payer: Self-pay | Admitting: Student

## 2023-10-17 ENCOUNTER — Ambulatory Visit: Payer: Medicare Other | Admitting: Student

## 2023-10-17 ENCOUNTER — Other Ambulatory Visit (HOSPITAL_COMMUNITY)
Admission: RE | Admit: 2023-10-17 | Discharge: 2023-10-17 | Disposition: A | Discharge status: Home / Self Care | Payer: Medicare Other | Source: Ambulatory Visit | Attending: Family Medicine | Admitting: Family Medicine

## 2023-10-17 VITALS — BP 197/85 | HR 59 | Ht 62.0 in | Wt 177.8 lb

## 2023-10-17 DIAGNOSIS — J387 Other diseases of larynx: Secondary | ICD-10-CM | POA: Diagnosis not present

## 2023-10-17 DIAGNOSIS — N898 Other specified noninflammatory disorders of vagina: Secondary | ICD-10-CM

## 2023-10-17 MED ORDER — CLOTRIMAZOLE 1 % EX CREA: 1.0000 | TOPICAL_CREAM | Freq: Every evening | CUTANEOUS | 0 refills | Status: AC

## 2023-10-17 NOTE — Patient Instructions (Addendum)
It was great to see you today!   Today we addressed: Mouth sore: I recommend trying over the counter Orajel as a numbing medication. You can also use a mouth rinse.  I am sending in vaginal suppositories for your vaginal itching to treat a yeast infection.     Future Appointments  Date Time Provider Department Center  10/23/2023 10:50 AM Doreene Eland, MD [facility]  11/12/2023 11:20 AM Jones Bales, NP [facility]  11/12/2023  1:30 PM Perlie Gold, PA-C [facility]  12/14/2023 11:20 AM Jones Bales, NP [facility]    Please arrive 15 minutes before your appointment to ensure smooth check in process.    Please call the clinic at [phone] if your symptoms worsen or you have any concerns.  Thank you for allowing me to participate in your care, Dr. Glendale Chard [facility]

## 2023-10-17 NOTE — Progress Notes (Signed)
    SUBJECTIVE:   CHIEF COMPLAINT / HPI:   Maria Griffith is a 62 y.o. female  presenting for an acute visit for mouth pain.   Mouth pain: started 3-4 days ago, does not remember an inciting event. Feels like a sore in her upper mouth, does not seem to be related to a tooth. Hurts worse when she chews and swallows. Denies choking on food. She remembers having sores in her mouth previously months-years ago. She keeps up with her dental care and most recently was seen several months ago. She has a partial denture she wears frequently but reports it does not fit well.  She reports she has had unprotected oral sex but this was several years ago. She has never had a throat swab before.   Vaginal itching: ongoing for several days to weeks. Reports as vaginal itching with no odor or smell.   PERTINENT  PMH / PSH: Reviewed and updated   OBJECTIVE:   BP (!) 197/85   Pulse (!) 59   Ht 5\' 2"  (1.575 m)   Wt 177 lb 12.8 oz (80.6 kg)   LMP 10/22/2014 (Approximate)   SpO2 100%   BMI 32.52 kg/m   Well-appearing, no acute distress, ambulates with cane HEENT: two ulcers present on soft palate, poor dentition with multiple absent teeth Cardio: Regular rate, regular rhythm, no murmurs on exam. Pulm: Clear, no wheezing, no crackles. No increased work of breathing Abdominal: bowel sounds present, soft, non-tender, non-distended Extremities: no peripheral edema    Neuro: CN II: PERRL CN III, IV,VI: EOMI CV V: Normal sensation in V1, V2, V3 CVII: Symmetric smile and brow raise CN VIII: Normal hearing CN IX,X: Symmetric palate raise  CN XII: Symmetric tongue protrusion  UE and LE strength 5/5        10/12/2023    2:48 PM 08/28/2023   10:19 AM 08/16/2023   11:59 AM  PHQ9 SCORE ONLY  PHQ-9 Total Score 2 7 2       ASSESSMENT/PLAN:   No problem-specific Assessment & Plan notes found for this encounter.   Throat sore:  Differential includes STI, benign aphthous ulcer, throat malignancy  (low suspicion due to remote history of tobacco abuse). Throat swabbed for GC. Recommended patient use oral mouth swish to help with pain. Will call her with results of swab.   Vaginal itching:  Similar to prior yeast infections. Denies recent or new sexual partners. Will empirically treat with vaginal suppository.   HTN:  BP elevated in the office today. Normal neurological exam, denies HA, and vision changes. Patient reports not taking her blood pressure medication this morning. Instructed her to follow up with PCP at next visit.   Patient scheduled for pap smear next week with PCP and for BP follow up.   Glendale Chard, DO [facility]

## 2023-10-18 LAB — CERVICOVAGINAL ANCILLARY ONLY
Chlamydia: NEGATIVE
Comment: NEGATIVE
Comment: NORMAL
Neisseria Gonorrhea: NEGATIVE

## 2023-10-22 ENCOUNTER — Telehealth: Payer: Self-pay | Admitting: Family Medicine

## 2023-10-22 NOTE — Telephone Encounter (Signed)
HIPAA compliant callback message left.  Pls advise. I called to reconcile her medications prior to her appointment tomorrow. Please encourage her to come in with her medication bottles tomorrow when she calls.  Would have been nice if I can complete her med rec today over the phone in anticipation of her visit tomorrow.

## 2023-10-23 ENCOUNTER — Other Ambulatory Visit: Payer: Self-pay | Admitting: Family Medicine

## 2023-10-23 ENCOUNTER — Ambulatory Visit: Payer: Medicare Other

## 2023-10-23 ENCOUNTER — Ambulatory Visit (INDEPENDENT_AMBULATORY_CARE_PROVIDER_SITE_OTHER): Payer: Medicare Other | Admitting: Family Medicine

## 2023-10-23 ENCOUNTER — Telehealth: Payer: Self-pay | Admitting: Family Medicine

## 2023-10-23 ENCOUNTER — Encounter: Payer: Self-pay | Admitting: Family Medicine

## 2023-10-23 ENCOUNTER — Other Ambulatory Visit (HOSPITAL_COMMUNITY)
Admission: RE | Admit: 2023-10-23 | Discharge: 2023-10-23 | Disposition: A | Discharge status: Home / Self Care | Payer: Medicare Other | Source: Ambulatory Visit | Attending: Family Medicine | Admitting: Family Medicine

## 2023-10-23 VITALS — BP 192/100 | HR 60 | Wt 182.0 lb

## 2023-10-23 DIAGNOSIS — I5030 Unspecified diastolic (congestive) heart failure: Secondary | ICD-10-CM | POA: Diagnosis not present

## 2023-10-23 DIAGNOSIS — N1832 Chronic kidney disease, stage 3b: Secondary | ICD-10-CM

## 2023-10-23 DIAGNOSIS — I10 Essential (primary) hypertension: Secondary | ICD-10-CM | POA: Diagnosis not present

## 2023-10-23 DIAGNOSIS — Z01419 Encounter for gynecological examination (general) (routine) without abnormal findings: Secondary | ICD-10-CM | POA: Diagnosis present

## 2023-10-23 DIAGNOSIS — N760 Acute vaginitis: Secondary | ICD-10-CM

## 2023-10-23 DIAGNOSIS — Z1151 Encounter for screening for human papillomavirus (HPV): Secondary | ICD-10-CM | POA: Diagnosis not present

## 2023-10-23 DIAGNOSIS — G959 Disease of spinal cord, unspecified: Secondary | ICD-10-CM | POA: Diagnosis not present

## 2023-10-23 DIAGNOSIS — Z23 Encounter for immunization: Secondary | ICD-10-CM

## 2023-10-23 DIAGNOSIS — Z1231 Encounter for screening mammogram for malignant neoplasm of breast: Secondary | ICD-10-CM | POA: Diagnosis not present

## 2023-10-23 DIAGNOSIS — Z114 Encounter for screening for human immunodeficiency virus [HIV]: Secondary | ICD-10-CM

## 2023-10-23 DIAGNOSIS — Z113 Encounter for screening for infections with a predominantly sexual mode of transmission: Secondary | ICD-10-CM

## 2023-10-23 DIAGNOSIS — F331 Major depressive disorder, recurrent, moderate: Secondary | ICD-10-CM

## 2023-10-23 DIAGNOSIS — Z124 Encounter for screening for malignant neoplasm of cervix: Secondary | ICD-10-CM

## 2023-10-23 DIAGNOSIS — K1379 Other lesions of oral mucosa: Secondary | ICD-10-CM | POA: Diagnosis not present

## 2023-10-23 LAB — POCT WET PREP (WET MOUNT)
Clue Cells Wet Prep Whiff POC: NEGATIVE
Trichomonas Wet Prep HPF POC: ABSENT

## 2023-10-23 MED ORDER — AMLODIPINE BESYLATE 5 MG PO TABS: 5.0000 mg | ORAL_TABLET | Freq: Every day | ORAL | 1 refills | Status: DC

## 2023-10-23 NOTE — Assessment & Plan Note (Signed)
Compliant with Trazodone Stable

## 2023-10-23 NOTE — Assessment & Plan Note (Signed)
Stable on Metoprolol Not on Diuretics F/U with Cards as planned

## 2023-10-23 NOTE — Assessment & Plan Note (Signed)
Cmet checked today.

## 2023-10-23 NOTE — Progress Notes (Addendum)
SUBJECTIVE:   CHIEF COMPLAINT / HPI:  HTN: BP is mainly managed by her cardiologist. She came in with her home medication and confirmed she is taking Losartan 50 mg BID and Metoprolol 25 mg BID for her HTN. Although she has Norvasc 5 mg, Furosemide 20 mg, and Accuretic 20/12.5 mg listed on her medication list, she stated that her cardiologist discontinued all these medications, and she is to take only her Losartan and Metoprolol.  Her home BP range, she says, is usually in the 140s systolic. She denies any cardiopulm or neurologic symptoms  Gyn exam:  Here for PAP. She denies any vaginal bleeding. However, she endorsed vulva itching and vaginal odor. She is sexually active and uses protection on and off.   Palate irritation: She c/o right posterior palate irritation, which started 1 week after eating a certain meal. Her symptoms have improved a lot, but I would love to get this reassessed.   Chronic problems: Other chronic problems addressed and were stable - MDD, Cervical Myelopathy, CKD IIIb - see problem list  PERTINENT  PMH / PSH: PMHx reviewed  OBJECTIVE:   BP (!) 192/100   Pulse 60   Wt 182 lb (82.6 kg)   LMP 10/22/2014 (Approximate)   SpO2 97%   BMI 33.29 kg/m   Physical Exam Vitals and nursing note reviewed. Exam conducted with a chaperone present Clabe Seal Legette).  Constitutional:      Appearance: Normal appearance. She is not ill-appearing.  HENT:     Mouth/Throat:     Comments: The palate looks good, with no ulceration. There is a tiny area of mild irritation on the right posterior palate; otherwise, there is no lesion, ulcer, or bumps. Cardiovascular:     Rate and Rhythm: Normal rate and regular rhythm.     Heart sounds: Normal heart sounds.     Comments: Mild systolic click on her URSB Pulmonary:     Effort: Pulmonary effort is normal. No respiratory distress.     Breath sounds: Normal breath sounds. No wheezing.  Abdominal:     General: Abdomen is flat.   Genitourinary:    Labia:        Right: No tenderness or lesion.        Left: No tenderness or lesion.      Comments: Unable to visualize the cervix PAP specimen collected despite this  Vaginal vault bleed on contact - atrophic vaginitis     ASSESSMENT/PLAN:   Essential hypertension BP remains elevated despite multiple checks Unclear what her exact BP medication regimen is No clear documentation on whether or not she should be on Norvasc 5 mg, Furosemide 20 mg, and Accuretic 20/12.5 mg It makes sense not to be on Accuretic while on Losartan, but I am uncertain if the plan was for her to switch back on this from Losartan. No documented allergy to Norvasc She has F/U with Cards in 3 weeks and prefers they continue to manage her BP, at least till she sees them. In the meantime, in addition to her Losartan and Metoprolol, I restarted her Amlodipine 5 mg QD. F/U with Cards as planned for medication management I would be ok with taking over her BP management as it appears she does not follow up with cardiology as often for BP management She will f/u with me once she sees her cardiologist for BP management No other concerns  Irritation of palate ?? Traumatic given the acuity and the fact that it is slowly resolving Monitor  for now and consider ENT referral if this persists She is to see me in about 2 weeks for this if it persists She agreed with the plan   MDD (major depressive disorder), recurrent episode, moderate (HCC) Compliant with Trazodone Stable  Cervical myelopathy (HCC) Continue pain management and f/u with rehab  Heart failure with preserved ejection fraction (HCC) Stable on Metoprolol Not on Diuretics F/U with Cards as planned  CKD (chronic kidney disease), stage IIIb (HCC) Cmet checked today    Gyn exam: Cervical cancer screen PAP completed I advised her that I was unable to visualize her cervix and gave the option to cancel the test today and have her see  Gyn for repeat PAP Also, not that previous pelvic US did not make mention of a cervix, although she had a recent LEEP procedure done with her Gyn Patient opted to proceed with sending out her Swab to pathology for evaluation rather than repeating test I will contact her soon with the result and recommendations   Vaginitis: Wet prep done and was negative for BV, Yeast and Trich I reviewed her recent GC/Chlamydia testing which was negative Vaginal irritation might be from atrophic vaginitis I have called to discuss this result with her after visit but was unable to reach her  HIPAA compliant callback message left   COVID and flu shots given Mammogram slip provided to call breast center for her mammogram I also ordered mammogram  More than 50% of this 50 minutes face to face visit was spent on medication review, gyn exam, counseling and coordination of care  Janit Pagan, MD [facility]

## 2023-10-23 NOTE — Assessment & Plan Note (Signed)
Continue pain management and f/u with rehab

## 2023-10-23 NOTE — Patient Instructions (Signed)
Pap Test Why am I having this test? A Pap test, also called a Pap smear, is a screening test to check for signs of: Infection. Cancer of the cervix. The cervix is the lower part of the uterus that opens into the vagina. Changes that may be a sign that cancer is developing (precancerous changes). Women need this test on a regular basis. In general, you should have a Pap test every 3 years until you reach menopause or age 61. Women aged 30-60 may choose to have their Pap test done at the same time as an HPV (human papillomavirus) test every 5 years (instead of every 3 years). Your health care provider may recommend having Pap tests more or less often depending on your medical conditions and past Pap test results. What is being tested? Cervical cells are tested for signs of infection or abnormalities. What kind of sample is taken?  Your health care provider will collect a sample of cells from the surface of your cervix. This will be done using a small cotton swab, plastic spatula, or brush that is inserted into your vagina using a tool called a speculum. This sample is often collected during a pelvic exam, when you are lying on your back on an exam table with your feet in footrests (stirrups). In some cases, fluids (secretions) from the cervix or vagina may also be collected. How do I prepare for this test? Be aware of where you are in your menstrual cycle. If you are menstruating on the day of the test, you may be asked to reschedule. You may need to reschedule if you have a known vaginal infection on the day of the test. Follow instructions from your health care provider about: Changing or stopping your regular medicines. Some medicines can cause abnormal test results, such as vaginal medicines and tetracycline. Avoiding douching 2-3 days before or the day of the test. Tell a health care provider about: Any allergies you have. All medicines you are taking, including vitamins, herbs, eye drops,  creams, and over-the-counter medicines. Any bleeding problems you have. Any surgeries you have had. Any medical conditions you have. Whether you are pregnant or may be pregnant. How are the results reported? Your test results will be reported as either abnormal or normal. What do the results mean? A normal test result means that you do not have signs of cancer of the cervix. An abnormal result may mean that you have: Cancer. A Pap test by itself is not enough to diagnose cancer. You will have more tests done if cancer is suspected. Precancerous changes in your cervix. Inflammation of the cervix. An STI (sexually transmitted infection). A fungal infection. A parasite infection. Talk with your health care provider about what your results mean. In some cases, your health care provider may do more testing to confirm the results. Questions to ask your health care provider Ask your health care provider, or the department that is doing the test: When will my results be ready? How will I get my results? What are my treatment options? What other tests do I need? What are my next steps? Summary In general, women should have a Pap test every 3 years until they reach menopause or age 61. Your health care provider will collect a sample of cells from the surface of your cervix. This will be done using a small cotton swab, plastic spatula, or brush. In some cases, fluids (secretions) from the cervix or vagina may also be collected. This information is not   intended to replace advice given to you by your health care provider. Make sure you discuss any questions you have with your health care provider. Document Revised: 03/04/2021 Document Reviewed: 03/04/2021 Elsevier Patient Education  2024 [employer] Inc[employer]  

## 2023-10-23 NOTE — Addendum Note (Signed)
Addended by: Janit Pagan T on: 10/23/2023 03:24 PM   Modules accepted: Orders, Level of Service

## 2023-10-23 NOTE — Assessment & Plan Note (Signed)
??   Traumatic given the acuity and the fact that it is slowly resolving Monitor for now and consider ENT referral if this persists She is to see me in about 2 weeks for this if it persists She agreed with the plan

## 2023-10-23 NOTE — Telephone Encounter (Signed)
HIPAA compliant callback message left  Please advise - Negative wet prep Recent STD screen negative Follow up soon if still having vaginal irritation

## 2023-10-23 NOTE — Assessment & Plan Note (Addendum)
BP remains elevated despite multiple checks Unclear what her exact BP medication regimen is No clear documentation on whether or not she should be on Norvasc 5 mg, Furosemide 20 mg, and Accuretic 20/12.5 mg It makes sense not to be on Accuretic while on Losartan, but I am uncertain if the plan was for her to switch back on this from Losartan. No documented allergy to Norvasc She has F/U with Cards in 3 weeks and prefers they continue to manage her BP, at least till she sees them. In the meantime, in addition to her Losartan and Metoprolol, I restarted her Amlodipine 5 mg QD. F/U with Cards as planned for medication management I would be ok with taking over her BP management as it appears she does not follow up with cardiology as often for BP management She will f/u with me once she sees her cardiologist for BP management No other concerns

## 2023-10-23 NOTE — Telephone Encounter (Signed)
Patient returns call to nurse line.   Results discussed with patient and to FU in clinic is symptoms fail to improve.   She asked when her recent HIV test was. Advised 01/12/2023.  She reports she would like to come in for a lab visit to be re-screened.   Will forward to PCP.

## 2023-10-24 ENCOUNTER — Telehealth: Payer: Self-pay | Admitting: Family Medicine

## 2023-10-24 LAB — CMP14+EGFR
ALT: 17 [IU]/L (ref 0–32)
AST: 25 [IU]/L (ref 0–40)
Albumin: 4.1 g/dL (ref 3.9–4.9)
Alkaline Phosphatase: 71 [IU]/L (ref 44–121)
BUN/Creatinine Ratio: 18 (ref 12–28)
BUN: 21 mg/dL (ref 8–27)
Bilirubin Total: 0.3 mg/dL (ref 0.0–1.2)
CO2: 25 mmol/L (ref 20–29)
Calcium: 9.1 mg/dL (ref 8.7–10.3)
Chloride: 102 mmol/L (ref 96–106)
Creatinine, Ser: 1.15 mg/dL — ABNORMAL HIGH (ref 0.57–1.00)
Globulin, Total: 2.9 g/dL (ref 1.5–4.5)
Glucose: 80 mg/dL (ref 70–99)
Potassium: 4.3 mmol/L (ref 3.5–5.2)
Sodium: 141 mmol/L (ref 134–144)
Total Protein: 7 g/dL (ref 6.0–8.5)
eGFR: 54 mL/min/{1.73_m2} — ABNORMAL LOW (ref >59)

## 2023-10-24 NOTE — Telephone Encounter (Signed)
HIPAA compliant callback message left  Please advise: Her liver enzyme is normal Kidney function improved from previous. We will keep an eye on this for now. F/U as planned.

## 2023-10-24 NOTE — Telephone Encounter (Signed)
Patient have a lab visit tomorrow at 415

## 2023-10-25 ENCOUNTER — Other Ambulatory Visit: Payer: Medicare Other

## 2023-10-30 LAB — CYTOLOGY - PAP
Comment: NEGATIVE
Diagnosis: NEGATIVE
High risk HPV: NEGATIVE

## 2023-10-31 ENCOUNTER — Telehealth: Payer: Self-pay | Admitting: Family Medicine

## 2023-10-31 NOTE — Telephone Encounter (Signed)
Normal PAP discussed. Transition zone present.  Neg PAP x 2 post-LEEP.  Plan to repeat PAP in 3 years. She agreed with the plan

## 2023-11-12 ENCOUNTER — Telehealth: Payer: Self-pay | Admitting: Family Medicine

## 2023-11-12 ENCOUNTER — Other Ambulatory Visit: Payer: Medicare Other

## 2023-11-12 ENCOUNTER — Encounter: Payer: Medicare Other | Attending: Registered Nurse | Admitting: Registered Nurse

## 2023-11-12 ENCOUNTER — Ambulatory Visit: Payer: Medicare Other | Attending: Cardiology | Admitting: Cardiology

## 2023-11-12 ENCOUNTER — Encounter: Payer: Self-pay | Admitting: Cardiology

## 2023-11-12 VITALS — BP 126/78 | HR 68 | Ht 62.0 in | Wt 179.0 lb

## 2023-11-12 VITALS — BP 160/80 | HR 53 | Ht 62.0 in | Wt 179.0 lb

## 2023-11-12 DIAGNOSIS — M7918 Myalgia, other site: Secondary | ICD-10-CM | POA: Diagnosis not present

## 2023-11-12 DIAGNOSIS — M542 Cervicalgia: Secondary | ICD-10-CM | POA: Insufficient documentation

## 2023-11-12 DIAGNOSIS — R202 Paresthesia of skin: Secondary | ICD-10-CM | POA: Insufficient documentation

## 2023-11-12 DIAGNOSIS — G8929 Other chronic pain: Secondary | ICD-10-CM | POA: Insufficient documentation

## 2023-11-12 DIAGNOSIS — G894 Chronic pain syndrome: Secondary | ICD-10-CM | POA: Insufficient documentation

## 2023-11-12 DIAGNOSIS — I10 Essential (primary) hypertension: Secondary | ICD-10-CM

## 2023-11-12 DIAGNOSIS — M546 Pain in thoracic spine: Secondary | ICD-10-CM | POA: Diagnosis not present

## 2023-11-12 DIAGNOSIS — M5412 Radiculopathy, cervical region: Secondary | ICD-10-CM | POA: Insufficient documentation

## 2023-11-12 DIAGNOSIS — I2510 Atherosclerotic heart disease of native coronary artery without angina pectoris: Secondary | ICD-10-CM

## 2023-11-12 DIAGNOSIS — Z5181 Encounter for therapeutic drug level monitoring: Secondary | ICD-10-CM | POA: Diagnosis not present

## 2023-11-12 DIAGNOSIS — Z114 Encounter for screening for human immunodeficiency virus [HIV]: Secondary | ICD-10-CM

## 2023-11-12 DIAGNOSIS — F329 Major depressive disorder, single episode, unspecified: Secondary | ICD-10-CM | POA: Diagnosis present

## 2023-11-12 DIAGNOSIS — Z79891 Long term (current) use of opiate analgesic: Secondary | ICD-10-CM | POA: Insufficient documentation

## 2023-11-12 DIAGNOSIS — M5416 Radiculopathy, lumbar region: Secondary | ICD-10-CM | POA: Insufficient documentation

## 2023-11-12 DIAGNOSIS — Z113 Encounter for screening for infections with a predominantly sexual mode of transmission: Secondary | ICD-10-CM

## 2023-11-12 DIAGNOSIS — F339 Major depressive disorder, recurrent, unspecified: Secondary | ICD-10-CM

## 2023-11-12 DIAGNOSIS — I5032 Chronic diastolic (congestive) heart failure: Secondary | ICD-10-CM

## 2023-11-12 DIAGNOSIS — G4709 Other insomnia: Secondary | ICD-10-CM | POA: Insufficient documentation

## 2023-11-12 MED ORDER — ASPIRIN 81 MG PO TBEC: 81.0000 mg | DELAYED_RELEASE_TABLET | Freq: Every day | ORAL | Status: AC

## 2023-11-12 MED ORDER — HYDROCODONE-ACETAMINOPHEN 7.5-325 MG PO TABS: ORAL_TABLET | ORAL | 0 refills | Status: DC

## 2023-11-12 NOTE — Telephone Encounter (Signed)
Referral placed.

## 2023-11-12 NOTE — Patient Instructions (Addendum)
Medication Instructions:  RESUME Aspirin 81mg  Take 1 tablet once a day  *If you need a refill on your cardiac medications before your next appointment, please call your pharmacy*   Lab Work: None ordered   Testing/Procedures: Your physician has requested that you have an echocardiogram. Echocardiography is a painless test that uses sound waves to create images of your heart. It provides your doctor with information about the size and shape of your heart and how well your heart's chambers and valves are working. This procedure takes approximately one hour. There are no restrictions for this procedure. Please do NOT wear cologne, perfume, aftershave, or lotions (deodorant is allowed). Please arrive 15 minutes prior to your appointment time.  Please note: We ask at that you not bring children with you during ultrasound (echo/ vascular) testing. Due to room size and safety concerns, children are not allowed in the ultrasound rooms during exams. Our front office staff cannot provide observation of children in our lobby area while testing is being conducted. An adult accompanying a patient to their appointment will only be allowed in the ultrasound room at the discretion of the ultrasound technician under special circumstances. We apologize for any inconvenience.   Follow-Up: At [facility], you and your health needs are our priority.  As part of our continuing mission to provide you with exceptional heart care, we have created designated Provider Care Teams.  These Care Teams include your primary Cardiologist (physician) and Advanced Practice Providers (APPs -  Physician Assistants and Nurse Practitioners) who all work together to provide you with the care you need, when you need it.  We recommend signing up for the patient portal called "MyChart".  Sign up information is provided on this After Visit Summary.  MyChart is used to connect with patients for Virtual Visits (Telemedicine).   Patients are able to view lab/test results, encounter notes, upcoming appointments, etc.  Non-urgent messages can be sent to your provider as well.   To learn more about what you can do with MyChart, go to [email or web address].    Your next appointment:   6 month(s)  Provider:   Verne Carrow, MD     Other Instructions
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
## 2023-11-12 NOTE — Progress Notes (Signed)
Cardiology Office Note:   Date:  11/12/2023  ID:  CLARITY WI, DOB Nov 22, 1961, MRN [id number] PCP: Doreene Eland, MD  [facility] Providers Cardiologist:  Verne Carrow, MD    History of Present Illness:   Discussed the use of AI scribe software for clinical note transcription with the patient, who gave verbal consent to proceed.  History of Present Illness   The patient is a 62 year old individual with a history of hypertensive heart disease, chronic diastolic congestive heart failure, and anxiety. She presented to cardiology in November 2013 for workup of syncope and chest pain. The patient underwent a series of tests, including a head CT, cardiac CTA, and echocardiogram, all of which showed no evidence of coronary artery disease or septal defect. Even with negative test results, the patient was referred for possible stress testing.  In November 2013, the patient experienced an episode of dizziness, weakness, and severe headache, which prevented her from standing while at the cardiology office for her initial evaluation. This episode led to an emergency medical service call and the patient was not seen in cardiology that day. The patient was later evaluated by neurology and it was determined that she was experiencing complicated migraines.  In January 2018, an echocardiogram showed normal left ventricular (LV) systolic function, mild aortic insufficiency (AI), moderate left ventricular hypertrophy (LVH), and a mildly dilated descending aorta. A nuclear stress test in March 2018 showed no ischemia. In October 2022, an echocardiogram showed an ejection fraction of 55-60%, moderate to severe AI with a dilated aortic root, and mild LVH. A cardiac MRI in November 2022 showed a dilated LV, moderate LVH, and a severely dilated aortic root and ascending aorta with moderate central aortic valve regurgitation.  In January 2023, a coronary CT showed mild disease in the right coronary  artery and first diagonal artery with a calcium score of zero. The patient underwent elective surgery in June 2023 for replacement of the ascending aorta and had a successful recovery.  The patient reports feeling tired frequently, similar to last year's visit. She also reports occasional shortness of breath, particularly when bending over or moving quickly. She reports non-specific chest discomfort, not exertionally associated. The patient also reports occasional dizziness, which can be severe enough to require her to stop and close her eyes until it passes. She also reports occasional stiffness and pain in her right hand/arm and is being followed by physical medicine and rehabilitation for cervicalgia.   The patient acknowledges the need for improvement in her diet and exercise habits. She reports trying to avoid salty foods and walking for exercise, but admits to getting tired quickly. A significant portion of her diet is takeout type foods. She also reports swelling in her legs, which she manages by elevating her legs. The patient is currently taking amlodipine, losartan, and metoprolol for blood pressure management. A significant amount of time today was spend reviewing medications as patient's list of meds she was previously taking differed significantly from her pill packs. She has been noted with increased BP and was recently seen by her PCP in part for concern of same. At that visit, patient was restarted on Amlodipine 5mg .       Studies Reviewed:    EKG:   EKG Interpretation Date/Time:  Monday November 12 2023 13:36:55 EST Ventricular Rate:  53 PR Interval:  154 QRS Duration:  84 QT Interval:  444 QTC Calculation: 416 R Axis:   20  Text Interpretation: Sinus bradycardia When compared with  ECG of 19-Oct-2022 10:45, PREVIOUS ECG IS PRESENT Confirmed by Perlie Gold [phone]) on 11/12/2023 1:39:07 PM    08/29/22 TTE  IMPRESSIONS     1. Left ventricular ejection fraction, by  estimation, is 60 to 65%. The  left ventricle has normal function. The left ventricle has no regional  wall motion abnormalities. There is mild left ventricular hypertrophy.  Left ventricular diastolic parameters  were normal.   2. Right ventricular systolic function is normal. The right ventricular  size is normal.   3. The mitral valve is normal in structure. Trivial mitral valve  regurgitation. No evidence of mitral stenosis.   4. The aortic valve is normal in structure. Aortic valve regurgitation is  mild to moderate. No aortic stenosis is present.   5. Ascending aorta 3.4 cm. Aortic root/ascending aorta has been  repaired/replaced.   6. The inferior vena cava is normal in size with greater than 50%  respiratory variability, suggesting right atrial pressure of 3 mmHg.   FINDINGS   Left Ventricle: Left ventricular ejection fraction, by estimation, is 60  to 65%. The left ventricle has normal function. The left ventricle has no  regional wall motion abnormalities. The left ventricular internal cavity  size was normal in size. There is   mild left ventricular hypertrophy. Left ventricular diastolic parameters  were normal.   Right Ventricle: The right ventricular size is normal. No increase in  right ventricular wall thickness. Right ventricular systolic function is  normal.   Left Atrium: Left atrial size was normal in size.   Right Atrium: Right atrial size was normal in size.   Pericardium: There is no evidence of pericardial effusion.   Mitral Valve: The mitral valve is normal in structure. Trivial mitral  valve regurgitation. No evidence of mitral valve stenosis.   Tricuspid Valve: The tricuspid valve is normal in structure. Tricuspid  valve regurgitation is mild . No evidence of tricuspid stenosis.   Aortic Valve: The aortic valve is normal in structure. Aortic valve  regurgitation is mild to moderate. Aortic regurgitation PHT measures 818  msec. No aortic stenosis is  present. Aortic valve mean gradient measures  3.0 mmHg. Aortic valve peak gradient  measures 6.4 mmHg. Aortic valve area, by VTI measures 2.56 cm.   Pulmonic Valve: The pulmonic valve was normal in structure. Pulmonic valve  regurgitation is not visualized. No evidence of pulmonic stenosis.   Aorta: Ascending aorta 3.4 cm. The aortic root/ascending aorta has been  repaired/replaced.   Venous: The inferior vena cava is normal in size with greater than 50%  respiratory variability, suggesting right atrial pressure of 3 mmHg.   IAS/Shunts: No atrial level shunt detected by color flow Doppler.   Risk Assessment/Calculations:     HYPERTENSION CONTROL Vitals:   11/12/23 1302 11/12/23 1330  BP: (!) 150/70 (!) 160/80    The patient's blood pressure is elevated above target today.  In order to address the patient's elevated BP: A current anti-hypertensive medication was adjusted today.           Physical Exam:   VS:  BP (!) 160/80   Pulse (!) 53   Ht 5\' 2"  (1.575 m)   Wt 179 lb (81.2 kg)   LMP 10/22/2014 (Approximate)   SpO2 97%   BMI 32.74 kg/m    Wt Readings from Last 3 Encounters:  11/12/23 179 lb (81.2 kg)  11/12/23 179 lb (81.2 kg)  10/23/23 182 lb (82.6 kg)     Physical Exam Vitals reviewed.  Constitutional:      Appearance: Normal appearance.  HENT:     Head: Normocephalic.     Nose: Nose normal.  Eyes:     Pupils: Pupils are equal, round, and reactive to light.  Cardiovascular:     Rate and Rhythm: Normal rate and regular rhythm.     Pulses: Normal pulses.     Heart sounds: Murmur (RUSB) heard.     No friction rub. No gallop.  Pulmonary:     Effort: Pulmonary effort is normal.     Breath sounds: Normal breath sounds.  Abdominal:     General: Abdomen is flat.  Musculoskeletal:     Right lower leg: No edema.     Left lower leg: No edema.  Skin:    General: Skin is warm and dry.     Capillary Refill: Capillary refill takes less than 2 seconds.   Neurological:     General: No focal deficit present.     Mental Status: She is alert and oriented to person, place, and time.  Psychiatric:        Mood and Affect: Mood normal.        Behavior: Behavior normal.        Thought Content: Thought content normal.        Judgment: Judgment normal.    ASSESSMENT AND PLAN:     Assessment and Plan    Hypertension Hypertensive heart disease with recent BP readings of 160/80 (and up to 192/100 at PCP). Current medications: amlodipine 5 mg, losartan 50 mg BID, metoprolol tartrate 25 mg BID. Discussed increasing amlodipine to 10 mg daily and potential switch to carvedilol for better BP control, noting carvedilol's side effects (lower heart rates, fatigue). - Increase amlodipine to 10 mg daily - Continue Metoprolol Tartrate 25mg  BID, Losartan 50mg  BID. - Send note to primary care physician for further BP management as patient has more regular follow up. Consider changing beta blocker to Coreg. Could also switch to more potent ARB such as Valsartan.  - Provide handout on low salt diet  Aortic Valve Regurgitation Moderate to severe aortic insufficiency with dilated aortic root (4.8 cm). Post-aortic root replacement (June 2023). Current echocardiogram: LVEF 55-60%, mild to moderate regurgitation. Murmur present. Annual echocardiograms recommended. - Order echocardiogram to monitor aortic valve and root  Chronic Diastolic Congestive Heart Failure Chronic diastolic CHF with occasional exertional dyspnea, mild LE edema. No recent exacerbations. - Monitor symptoms and continue with PRN Lasix for swelling - Continue Losartan as above.  Coronary Artery Disease Nonobstructive CAD with mild disease in RCA and first diag, calcium score of zero. Last coronary CT (January 2023). Cholesterol level 75 (January 2023). Continue daily low-dose aspirin and monitor cholesterol. - Continue daily low-dose aspirin (81 mg enteric coated) - Monitor cholesterol levels  with PCP  Fatigue Suspect that patient's ongoing chronic fatigue and dizziness may be largely a result of persistent hypertension with simultaneous component of vertigo. - Repeat echocardiogram ordered.  General Health Maintenance Discussed diet and exercise for BP and cardiovascular health. Provided low salt diet handout and encouraged regular exercise and dietary modifications. - Provide handout on low salt diet - Encourage regular exercise and dietary modifications to reduce salt intake  Follow-up - Schedule follow-up appointment in 6 months - Send note to primary care physician regarding BP management and medication changes.              Signed, Perlie Gold, PA-C

## 2023-11-12 NOTE — Telephone Encounter (Signed)
Patient would like a referral to see a counselor. Patient states she has been depressed and would like to speak to someone about it.

## 2023-11-13 ENCOUNTER — Telehealth: Payer: Self-pay | Admitting: Cardiology

## 2023-11-13 LAB — RPR: RPR Ser Ql: NONREACTIVE

## 2023-11-13 LAB — HIV ANTIBODY (ROUTINE TESTING W REFLEX): HIV Screen 4th Generation wRfx: NONREACTIVE

## 2023-11-13 MED ORDER — AMLODIPINE BESYLATE 10 MG PO TABS: 10.0000 mg | ORAL_TABLET | Freq: Every day | ORAL | 0 refills | Status: DC

## 2023-11-13 NOTE — Telephone Encounter (Signed)
Spoke with patient regarding recent medication change at office visit yesterday.  Per Perlie Gold, PA-C, patient is to increase amlodipine to 10 mg daily.  Patient states she receives pill packs from her pharmacy and would need a new Rx with the new dose sent in to receive a bottle of the new dose to take until the pharmacy sends out her pill packs again.  Rx for amlodipine 10 mg daily sent to [facility] per patient's request. PCP to provide future refills.  Patient verbalized understanding of medication change to start taking amlodipine 10 mg daily.

## 2023-11-13 NOTE — Progress Notes (Signed)
Informed the patient about her test results. Patient understood, she always mention something about therapy I informe her that the doctor placed that referral for her.

## 2023-11-13 NOTE — Telephone Encounter (Signed)
-----   Message from Perlie Gold sent at 11/12/2023 10:10 PM EST ----- Regarding: med change This patient was instructed to increase Amlodipine to 10mg  but somehow this didn't make it into the AVS or orders. Please call her and ensure understanding of this instruction.   Perlie Gold, PA-C

## 2023-11-20 ENCOUNTER — Encounter: Payer: Self-pay | Admitting: Registered Nurse

## 2023-11-23 ENCOUNTER — Other Ambulatory Visit: Payer: Self-pay | Admitting: Cardiovascular Disease

## 2023-12-04 ENCOUNTER — Other Ambulatory Visit: Payer: Self-pay | Admitting: Registered Nurse

## 2023-12-06 ENCOUNTER — Telehealth: Payer: Self-pay | Admitting: Registered Nurse

## 2023-12-06 ENCOUNTER — Other Ambulatory Visit: Payer: Self-pay

## 2023-12-06 MED ORDER — PREGABALIN 100 MG PO CAPS: 100.0000 mg | ORAL_CAPSULE | Freq: Three times a day (TID) | ORAL | 3 refills | Status: DC

## 2023-12-06 MED ORDER — AMLODIPINE BESYLATE 10 MG PO TABS: 10.0000 mg | ORAL_TABLET | Freq: Every day | ORAL | 3 refills | Status: DC

## 2023-12-06 NOTE — Telephone Encounter (Signed)
PMP was reviewed.  Pregabalin e-scribed to pharmacy.

## 2023-12-07 ENCOUNTER — Telehealth: Payer: Self-pay

## 2023-12-07 NOTE — Telephone Encounter (Signed)
Patient calls nurse line requesting an update on PSY referral.   Referral was placed on 11/25. Advised on referral policy.   Will forward to referral coordinator.

## 2023-12-14 ENCOUNTER — Encounter: Payer: Medicare Other | Attending: Registered Nurse | Admitting: Registered Nurse

## 2023-12-14 ENCOUNTER — Encounter: Payer: Self-pay | Admitting: Registered Nurse

## 2023-12-14 VITALS — Ht 62.0 in | Wt 179.0 lb

## 2023-12-14 DIAGNOSIS — M546 Pain in thoracic spine: Secondary | ICD-10-CM | POA: Insufficient documentation

## 2023-12-14 DIAGNOSIS — M5416 Radiculopathy, lumbar region: Secondary | ICD-10-CM | POA: Diagnosis not present

## 2023-12-14 DIAGNOSIS — F329 Major depressive disorder, single episode, unspecified: Secondary | ICD-10-CM | POA: Diagnosis present

## 2023-12-14 DIAGNOSIS — Z5181 Encounter for therapeutic drug level monitoring: Secondary | ICD-10-CM

## 2023-12-14 DIAGNOSIS — G4709 Other insomnia: Secondary | ICD-10-CM

## 2023-12-14 DIAGNOSIS — M7918 Myalgia, other site: Secondary | ICD-10-CM | POA: Diagnosis not present

## 2023-12-14 DIAGNOSIS — Z79891 Long term (current) use of opiate analgesic: Secondary | ICD-10-CM | POA: Diagnosis not present

## 2023-12-14 DIAGNOSIS — G8929 Other chronic pain: Secondary | ICD-10-CM

## 2023-12-14 DIAGNOSIS — G894 Chronic pain syndrome: Secondary | ICD-10-CM | POA: Diagnosis not present

## 2023-12-14 DIAGNOSIS — M542 Cervicalgia: Secondary | ICD-10-CM | POA: Diagnosis not present

## 2023-12-14 DIAGNOSIS — R202 Paresthesia of skin: Secondary | ICD-10-CM

## 2023-12-14 DIAGNOSIS — M5412 Radiculopathy, cervical region: Secondary | ICD-10-CM

## 2023-12-14 MED ORDER — PREGABALIN 100 MG PO CAPS: 100.0000 mg | ORAL_CAPSULE | Freq: Three times a day (TID) | ORAL | 3 refills | Status: DC

## 2023-12-14 MED ORDER — HYDROCODONE-ACETAMINOPHEN 7.5-325 MG PO TABS: ORAL_TABLET | ORAL | 0 refills | Status: DC
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
## 2023-12-20 ENCOUNTER — Other Ambulatory Visit: Payer: Self-pay | Admitting: Registered Nurse

## 2023-12-21 ENCOUNTER — Telehealth: Payer: Self-pay

## 2023-12-21 MED ORDER — POLYETHYLENE GLYCOL 3350 17 GM/SCOOP PO POWD: 1.0000 | ORAL | 3 refills | Status: DC | PRN

## 2023-12-24 ENCOUNTER — Telehealth: Payer: Self-pay

## 2023-12-24 NOTE — Telephone Encounter (Signed)
 Record reviewed - referral was placed in Nov of 2024. See referral report below. Please advise her of the information below.   Type Date User Summary Attachment  General 12/20/2023  9:58 AM Charity Hulan RAMAN  We have spoken to your patient and sent out the required paperwork needed before scheduling. We have not received the paperwork back due to this reason we are closing the referral.   If your patient completes the paperwork, we will reopen the referral -  Note:   We have spoken to your patient and sent out the required paperwork needed before scheduling. We have not received the paperwork back due to this reason we are closing the referral.   If your patient completes the paperwork, we will reopen the referral and get them scheduled in our next opening.      Thanks! . Type Date User Summary Attachment  General 11/23/2023 12:12 PM Zackary Reena DASEN, CMA Referral sent to -  Note: Referral sent to   [facility] [address] [place], [place] [address] [phone]   They will call pt to schedule an appointment,   Margit Zackary, CMA

## 2023-12-24 NOTE — Telephone Encounter (Signed)
 2nd attempt to reach patient no answer. LVM stated that GI was reach out to her regarding being due for a colonoscopy, and that Dr. Anders wanted a video visit with her tomorrow if possible to talk about getting the canceling that she was inquiring about. Nelson Land, CMA

## 2023-12-24 NOTE — Telephone Encounter (Signed)
 Attempted to reach patient. To inform about the GI referral it regarding being due for colonoscopy, and that Dr. Anders wanted to due a video visit with her tomorrow Jan 7th regarding getting something in place for her to get canceling. Nelson Land, CMA

## 2023-12-25 ENCOUNTER — Telehealth (HOSPITAL_COMMUNITY): Payer: Self-pay | Admitting: Cardiology

## 2023-12-25 NOTE — Telephone Encounter (Signed)
 I called patient for a reminder of her echocardiogram for 12/26/23. Patient cancelled the appointment and will call back later to reschedule. Order will be removed from the echo wq and if patient calls back we will reinstate the orders. Thank you.

## 2023-12-26 ENCOUNTER — Ambulatory Visit (HOSPITAL_COMMUNITY): Payer: Medicare Other

## 2024-01-07 ENCOUNTER — Other Ambulatory Visit: Payer: Self-pay | Admitting: Registered Nurse

## 2024-01-10 NOTE — Telephone Encounter (Signed)
Task completed
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
## 2024-01-15 ENCOUNTER — Telehealth: Payer: Self-pay | Admitting: Registered Nurse

## 2024-01-15 ENCOUNTER — Encounter: Payer: Medicare Other | Attending: Registered Nurse | Admitting: Registered Nurse

## 2024-01-15 ENCOUNTER — Encounter: Payer: Self-pay | Admitting: Registered Nurse

## 2024-01-15 VITALS — BP 124/74 | HR 59 | Ht 62.0 in | Wt 179.0 lb

## 2024-01-15 DIAGNOSIS — M7918 Myalgia, other site: Secondary | ICD-10-CM | POA: Diagnosis not present

## 2024-01-15 DIAGNOSIS — G4709 Other insomnia: Secondary | ICD-10-CM | POA: Diagnosis not present

## 2024-01-15 DIAGNOSIS — Z79891 Long term (current) use of opiate analgesic: Secondary | ICD-10-CM

## 2024-01-15 DIAGNOSIS — M5416 Radiculopathy, lumbar region: Secondary | ICD-10-CM | POA: Diagnosis not present

## 2024-01-15 DIAGNOSIS — G894 Chronic pain syndrome: Secondary | ICD-10-CM

## 2024-01-15 DIAGNOSIS — Z5181 Encounter for therapeutic drug level monitoring: Secondary | ICD-10-CM

## 2024-01-15 DIAGNOSIS — M542 Cervicalgia: Secondary | ICD-10-CM

## 2024-01-15 DIAGNOSIS — G8929 Other chronic pain: Secondary | ICD-10-CM | POA: Diagnosis not present

## 2024-01-15 DIAGNOSIS — M546 Pain in thoracic spine: Secondary | ICD-10-CM | POA: Diagnosis not present

## 2024-01-15 DIAGNOSIS — M5412 Radiculopathy, cervical region: Secondary | ICD-10-CM

## 2024-01-15 DIAGNOSIS — R202 Paresthesia of skin: Secondary | ICD-10-CM

## 2024-01-15 MED ORDER — HYDROCODONE-ACETAMINOPHEN 7.5-325 MG PO TABS: ORAL_TABLET | ORAL | 0 refills | Status: DC

## 2024-01-15 NOTE — Patient Instructions (Signed)
Call your Transportation Service: For assistance for Transportation

## 2024-01-21 LAB — DRUG TOX MONITOR 1 W/CONF, ORAL FLD
Amobarbital: NEGATIVE ng/mL (ref <10)
Amphetamines: NEGATIVE ng/mL (ref <10)
Barbiturates: NEGATIVE ng/mL (ref <10)
Benzodiazepines: NEGATIVE ng/mL (ref <0.50)
Buprenorphine: NEGATIVE ng/mL (ref <0.10)
Butalbital: NEGATIVE ng/mL (ref <10)
Cocaine: NEGATIVE ng/mL (ref <5.0)
Codeine: NEGATIVE ng/mL (ref <2.5)
Dihydrocodeine: 5.2 ng/mL — ABNORMAL HIGH (ref <2.5)
Fentanyl: NEGATIVE ng/mL (ref <0.10)
Heroin Metabolite: NEGATIVE ng/mL (ref <1.0)
Hydrocodone: 34.3 ng/mL — ABNORMAL HIGH (ref <2.5)
Hydromorphone: NEGATIVE ng/mL (ref <2.5)
MARIJUANA: NEGATIVE ng/mL (ref <2.5)
MDMA: NEGATIVE ng/mL (ref <10)
Meprobamate: NEGATIVE ng/mL (ref <2.5)
Methadone: NEGATIVE ng/mL (ref <5.0)
Morphine: NEGATIVE ng/mL (ref <2.5)
Nicotine Metabolite: NEGATIVE ng/mL (ref <5.0)
Norhydrocodone: NEGATIVE ng/mL (ref <2.5)
Noroxycodone: NEGATIVE ng/mL (ref <2.5)
Opiates: POSITIVE ng/mL — AB (ref <2.5)
Oxycodone: NEGATIVE ng/mL (ref <2.5)
Oxymorphone: NEGATIVE ng/mL (ref <2.5)
Pentobarbital: NEGATIVE ng/mL (ref <10)
Phencyclidine: NEGATIVE ng/mL (ref <10)
Phenobarbital: NEGATIVE ng/mL (ref <10)
Secobarbital: NEGATIVE ng/mL (ref <10)
Tapentadol: NEGATIVE ng/mL (ref <5.0)
Tramadol: NEGATIVE ng/mL (ref <5.0)
Zolpidem: NEGATIVE ng/mL (ref <5.0)

## 2024-01-21 LAB — DRUG TOX ALC METAB W/CON, ORAL FLD: Alcohol Metabolite: NEGATIVE ng/mL (ref <25)

## 2024-01-22 ENCOUNTER — Other Ambulatory Visit: Payer: Self-pay | Admitting: Family Medicine

## 2024-02-18 ENCOUNTER — Other Ambulatory Visit: Payer: Self-pay

## 2024-02-19 MED ORDER — LIDOCAINE 5 % EX PTCH: MEDICATED_PATCH | CUTANEOUS | 1 refills | Status: DC
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
## 2024-03-12 ENCOUNTER — Other Ambulatory Visit: Payer: Self-pay | Admitting: Family Medicine

## 2024-03-12 ENCOUNTER — Encounter: Payer: Medicare Other | Admitting: Registered Nurse

## 2024-03-12 ENCOUNTER — Other Ambulatory Visit: Payer: Self-pay | Admitting: Registered Nurse

## 2024-03-12 DIAGNOSIS — R110 Nausea: Secondary | ICD-10-CM
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
## 2024-03-14 ENCOUNTER — Encounter: Payer: Self-pay | Admitting: Registered Nurse

## 2024-03-14 ENCOUNTER — Telehealth: Payer: Self-pay | Admitting: Registered Nurse

## 2024-03-14 ENCOUNTER — Encounter: Admitting: Registered Nurse

## 2024-03-14 MED ORDER — HYDROCODONE-ACETAMINOPHEN 7.5-325 MG PO TABS: ORAL_TABLET | ORAL | 0 refills | Status: DC

## 2024-03-14 NOTE — Telephone Encounter (Signed)
 Patient arrived to office, reporting she is sick, her appointment was re-scheduled.  PMP was reviewed.  Hydrocodone e-scribed to pharmacy.

## 2024-03-17 ENCOUNTER — Ambulatory Visit: Admitting: Registered Nurse

## 2024-03-26 ENCOUNTER — Other Ambulatory Visit: Payer: Self-pay | Admitting: Family Medicine

## 2024-03-26 DIAGNOSIS — Z1231 Encounter for screening mammogram for malignant neoplasm of breast: Secondary | ICD-10-CM

## 2024-04-07 ENCOUNTER — Encounter: Admitting: Registered Nurse

## 2024-04-07 ENCOUNTER — Other Ambulatory Visit: Payer: Self-pay | Admitting: Registered Nurse

## 2024-04-09 ENCOUNTER — Other Ambulatory Visit: Payer: Self-pay | Admitting: Family Medicine

## 2024-04-09 ENCOUNTER — Encounter: Attending: Registered Nurse | Admitting: Registered Nurse

## 2024-04-09 ENCOUNTER — Encounter: Payer: Self-pay | Admitting: Registered Nurse

## 2024-04-09 ENCOUNTER — Other Ambulatory Visit: Payer: Self-pay | Admitting: Registered Nurse

## 2024-04-09 VITALS — BP 132/73 | HR 65 | Resp 20 | Wt 190.0 lb

## 2024-04-09 DIAGNOSIS — M5412 Radiculopathy, cervical region: Secondary | ICD-10-CM | POA: Diagnosis not present

## 2024-04-09 DIAGNOSIS — M546 Pain in thoracic spine: Secondary | ICD-10-CM | POA: Insufficient documentation

## 2024-04-09 DIAGNOSIS — G8929 Other chronic pain: Secondary | ICD-10-CM | POA: Diagnosis not present

## 2024-04-09 DIAGNOSIS — M5416 Radiculopathy, lumbar region: Secondary | ICD-10-CM | POA: Insufficient documentation

## 2024-04-09 DIAGNOSIS — Z79891 Long term (current) use of opiate analgesic: Secondary | ICD-10-CM | POA: Insufficient documentation

## 2024-04-09 DIAGNOSIS — M542 Cervicalgia: Secondary | ICD-10-CM | POA: Diagnosis not present

## 2024-04-09 DIAGNOSIS — Z5181 Encounter for therapeutic drug level monitoring: Secondary | ICD-10-CM | POA: Insufficient documentation

## 2024-04-09 DIAGNOSIS — G894 Chronic pain syndrome: Secondary | ICD-10-CM | POA: Insufficient documentation

## 2024-04-09 MED ORDER — HYDROCODONE-ACETAMINOPHEN 7.5-325 MG PO TABS: ORAL_TABLET | ORAL | 0 refills | Status: DC

## 2024-04-09 MED ORDER — DICLOFENAC SODIUM 1 % EX GEL: CUTANEOUS | 3 refills | Status: AC

## 2024-04-09 MED ORDER — PREGABALIN 100 MG PO CAPS: 100.0000 mg | ORAL_CAPSULE | Freq: Three times a day (TID) | ORAL | 3 refills | Status: DC

## 2024-04-09 NOTE — Addendum Note (Signed)
 Addended by: Sagan Wurzel M on: 04/09/2024 08:14 AM   Modules accepted: Orders
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
## 2024-05-07 ENCOUNTER — Encounter: Attending: Registered Nurse | Admitting: Registered Nurse

## 2024-05-07 DIAGNOSIS — M5412 Radiculopathy, cervical region: Secondary | ICD-10-CM | POA: Diagnosis not present

## 2024-05-07 DIAGNOSIS — M546 Pain in thoracic spine: Secondary | ICD-10-CM | POA: Diagnosis not present

## 2024-05-07 DIAGNOSIS — Z79891 Long term (current) use of opiate analgesic: Secondary | ICD-10-CM | POA: Insufficient documentation

## 2024-05-07 DIAGNOSIS — M5416 Radiculopathy, lumbar region: Secondary | ICD-10-CM | POA: Diagnosis not present

## 2024-05-07 DIAGNOSIS — G8929 Other chronic pain: Secondary | ICD-10-CM | POA: Insufficient documentation

## 2024-05-07 DIAGNOSIS — Z5181 Encounter for therapeutic drug level monitoring: Secondary | ICD-10-CM | POA: Insufficient documentation

## 2024-05-07 DIAGNOSIS — M542 Cervicalgia: Secondary | ICD-10-CM | POA: Insufficient documentation

## 2024-05-07 DIAGNOSIS — G4709 Other insomnia: Secondary | ICD-10-CM | POA: Insufficient documentation

## 2024-05-07 DIAGNOSIS — R202 Paresthesia of skin: Secondary | ICD-10-CM | POA: Insufficient documentation

## 2024-05-07 DIAGNOSIS — G894 Chronic pain syndrome: Secondary | ICD-10-CM | POA: Insufficient documentation

## 2024-05-07 MED ORDER — HYDROCODONE-ACETAMINOPHEN 7.5-325 MG PO TABS: ORAL_TABLET | ORAL | 0 refills | Status: DC
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
## 2024-05-13 ENCOUNTER — Encounter: Payer: Self-pay | Admitting: Registered Nurse

## 2024-05-22 ENCOUNTER — Ambulatory Visit (HOSPITAL_COMMUNITY)
Admission: RE | Admit: 2024-05-22 | Discharge: 2024-05-22 | Disposition: A | Discharge status: Home / Self Care | Source: Ambulatory Visit | Attending: Family Medicine | Admitting: Family Medicine

## 2024-05-22 ENCOUNTER — Ambulatory Visit: Admitting: Student

## 2024-05-22 ENCOUNTER — Other Ambulatory Visit (HOSPITAL_COMMUNITY)
Admission: RE | Admit: 2024-05-22 | Discharge: 2024-05-22 | Disposition: A | Discharge status: Home / Self Care | Source: Ambulatory Visit

## 2024-05-22 ENCOUNTER — Ambulatory Visit: Payer: Self-pay | Admitting: Student

## 2024-05-22 VITALS — BP 124/79 | HR 59 | Ht 62.0 in | Wt 189.5 lb

## 2024-05-22 DIAGNOSIS — R252 Cramp and spasm: Secondary | ICD-10-CM

## 2024-05-22 DIAGNOSIS — R5383 Other fatigue: Secondary | ICD-10-CM

## 2024-05-22 DIAGNOSIS — N898 Other specified noninflammatory disorders of vagina: Secondary | ICD-10-CM

## 2024-05-22 DIAGNOSIS — R1033 Periumbilical pain: Secondary | ICD-10-CM | POA: Diagnosis not present

## 2024-05-22 LAB — POCT WET PREP (WET MOUNT)
Clue Cells Wet Prep Whiff POC: NEGATIVE
Trichomonas Wet Prep HPF POC: ABSENT

## 2024-05-22 LAB — POCT URINALYSIS DIP (MANUAL ENTRY)
Bilirubin, UA: NEGATIVE
Blood, UA: NEGATIVE
Glucose, UA: NEGATIVE mg/dL
Leukocytes, UA: NEGATIVE
Nitrite, UA: NEGATIVE
Protein Ur, POC: NEGATIVE mg/dL
Spec Grav, UA: 1.02 (ref 1.010–1.025)
Urobilinogen, UA: 0.2 U/dL
pH, UA: 5 (ref 5.0–8.0)

## 2024-05-22 MED ORDER — FLUCONAZOLE 150 MG PO TABS: 150.0000 mg | ORAL_TABLET | Freq: Once | ORAL | 0 refills | Status: AC

## 2024-05-22 MED ORDER — APIXABAN (ELIQUIS) VTE STARTER PACK (10MG AND 5MG): ORAL_TABLET | ORAL | 0 refills | Status: DC

## 2024-05-22 NOTE — Patient Instructions (Addendum)
  VISIT SUMMARY: During your visit, we discussed your abdominal pain, urinary symptoms, vaginal itching, leg cramps and swelling, fatigue, and shortness of breath. We have outlined a plan to address each of these issues and will be conducting further tests and follow-ups to ensure your health and well-being.  YOUR PLAN: -ABDOMINAL PAIN: Your abdominal pain around the navel, which worsens with movement, is likely related to constipation or other issues. Please increase your bowel regimen with Miralax  twice a day and Senna twice a day to help alleviate the pan for the next few days  -URINARY SYMPTOMS: Your increased urinary frequency and urgency may be due to a possible pelvic organ prolapse following your previous surgical procedure. We will perform a urinalysis and culture, and refer you to an OB-GYN for further evaluation and pelvic floor therapy if you are okay with that  -VAGINAL ITCHING: The vaginal itching you are experiencing may be due to irritation or an infection, possibly worsened by the use of medicated powder. We will perform a swab for STD testing and a wet prep to check for yeast, bacterial vaginosis, and trichomonas.  -LEG CRAMPS AND SWELLING: Your leg cramps and swelling, particularly in the left leg, may be due to an electrolyte imbalance or a DVT. We will order blood tests to check your electrolyte and ferritin levels and send in blood thinners in meantime. Will get a STAT DVT ultrasound for evaluation. Keep your phone on you.  -FATIGUE AND DYSPNEA: Your fatigue and shortness of breath may be related to heart failure, given your history of cardiac issues. We encourage you to follow up with your cardiologist for a thorough cardiac evaluation. Please return to care if this worsens or have chest pain  -GENERAL HEALTH MAINTENANCE: We discussed the use of over-the-counter supplements for memory and hormonal balance, and the potential interactions with your current medications. Please  review the ingredients of any supplements with us  to ensure they are safe for you.  INSTRUCTIONS: Please schedule follow-up appointments with your OB-GYN and cardiologist.  Await call about ultrasound  Return to care if chest pain, worsening swelling, fevers, shortness of breath Additionally, arrange a sooner follow-up visit with us  to monitor your ongoing symptoms next week                       Contains text generated by [employer]bridge.                                 Contains text generated by Abridge.                               Contains text generated by [employer]bridge.

## 2024-05-22 NOTE — Telephone Encounter (Signed)
 Called patient and confirmed name and DOB. Discussed results with her.  Discussed that there is no DVT on ultrasound and that she should not take her blood thinner.  Will route to PCP-patient states that Dr. Grandville Lax sent her a vaginal cream but she is not sure what it is. I cannot see what it is on list

## 2024-05-22 NOTE — Progress Notes (Signed)
 SUBJECTIVE:   CHIEF COMPLAINT / HPI: Multiple concerns  Discussed the use of AI scribe software for clinical note transcription with the patient, who gave verbal consent to proceed.  History of Present Illness Maria Griffith is a 63 year old female who presents with abdominal pain, vaginal itching, and leg pain  She experiences abdominal pain around the navel, worsening with movement, particularly when sitting up or bending. This pain has been intermittent but has recently worsened. Nausea is persistent and has intensified, with occasional vomiting. She has daily bowel movements with increased straining and constipation, using Miralax  and Senna as needed.  For the past two weeks, she has experienced urinary urgency and pressure, difficulty holding urine, and a sensation of incomplete bladder emptying. She has urinary incontinence, especially when coughing or singing, and a history of a LEEP procedure. Vaginal itching worsened after using medicated powder, leading her to switch to baby cornstarch powder.  She experiences leg cramps and swelling, particularly in the left leg, which have worsened over the past few months. The cramps are severe, causing her to wake up screaming, and are sometimes relieved by consuming mustard. Fatigue and decreased energy levels impact her daily activities and ability to care for her grandchildren. She also experiences shortness of breath, particularly when lying flat, ongoing since her open-heart surgery last year.  Denies any chest pain.  She did recently have a long trip from [place]  where she was driving 15 hours occurred on Monday.  States has been worse since then. No hx of PE or DVT in past.  Bowel regimen of MiraLAX  17 g daily and senna plus 8.6-50 mg daily  Current pain regimen: Norco 7.5-3 25 every 6 hours as needed Lidocaine  patch Voltaren  gel  Lyrica  100 mg 3 times daily  tizanidine  4 mg 3 times daily  PERTINENT  PMH / PSH: HTN, HFpEF, aortic  valve insufficiency, asthma, CKD 3B, s/p aortic aneurysm repair, umbilical hernia, chronic pain on hydrocodone  seen by pain management  OBJECTIVE:   BP 124/79   Pulse (!) 59   Ht 5\' 2"  (1.575 m)   Wt 189 lb 8 oz (86 kg)   LMP 10/22/2014 (Approximate)   SpO2 100%   BMI 34.66 kg/m   General: Well appearing, NAD, awake, alert, responsive to questions Head: Normocephalic atraumatic Respiratory: chest rises symmetrically,  no increased work of breathing Abdomen: Soft, non-tender, non-distended, normoactive bowel sounds  Extremities: Moves upper and lower extremities freely, edema in LLE > RLE pain in popliteal fossa on left, no warmth or erythema Pelvic: VULVA: normal appearing vulva with no masses, tenderness or lesions, VAGINA: atrophic with scant discharge present, possible prolapse of cervix? No lesions  Chaperone Maria Griffith CMA present for pelvic exam   ASSESSMENT/PLAN:   Assessment & Plan Periumbilical abdominal pain Periumbilical pain with nausea likely related to constipation or medication side effects (norco).  Will obtain urine studies given some urinary frequency/pressure. - Increase bowel regimen with Miralax  and Senna to BID for next few days - CMP - UA, send for urine culture Vaginal itching Itching possibly due to irritation or infection, exacerbated by medicated powder. - Perform swab for STD testing and wet prep for yeast, BV, and trichomonas - Recommend OB/GYN evaluation for possible prolapse - Recommend pelvic floor PT if continuing Leg cramping Left leg cramps and asymmetric swelling with popliteal tenderness, possible electrolyte imbalance, chronic pain or DVT given history of recent long travel. - Order blood tests for electrolytes and ferritin levels -  STAT DVT u/s - After discussion of risk/benefit opted for sending in Eliquis VTE starter pack in meantime - Strict ED/return precautions Other fatigue Fatigue and dyspnea history of cardiac issues, no  current chest pain - Encouraged cardiology follow-up for cardiac evaluation.  Follow-up Multiple ongoing symptoms require close monitoring. - Schedule follow-up with OB-GYN. - Schedule follow-up with cardiologist. - Arrange sooner follow-up visits within the next week - Discuss over-the-counter supplements for memory and hormonal balance, potential medication interactions. - Review supplement ingredients for safety with current medications at future visits  Genora Kidd, MD [facility]

## 2024-05-23 ENCOUNTER — Telehealth: Payer: Self-pay

## 2024-05-23 LAB — CERVICOVAGINAL ANCILLARY ONLY
Chlamydia: NEGATIVE
Comment: NEGATIVE
Comment: NORMAL
Neisseria Gonorrhea: NEGATIVE

## 2024-05-23 NOTE — Telephone Encounter (Signed)
 Contacted the patient and left a voicemail informing her to contact us  back when she gets the chance.

## 2024-05-24 LAB — COMPREHENSIVE METABOLIC PANEL WITH GFR
ALT: 18 IU/L (ref 0–32)
AST: 25 IU/L (ref 0–40)
Albumin: 4.3 g/dL (ref 3.9–4.9)
Alkaline Phosphatase: 94 IU/L (ref 44–121)
BUN/Creatinine Ratio: 16 (ref 12–28)
BUN: 18 mg/dL (ref 8–27)
Bilirubin Total: 0.4 mg/dL (ref 0.0–1.2)
CO2: 24 mmol/L (ref 20–29)
Calcium: 9.6 mg/dL (ref 8.7–10.3)
Chloride: 104 mmol/L (ref 96–106)
Creatinine, Ser: 1.11 mg/dL — ABNORMAL HIGH (ref 0.57–1.00)
Globulin, Total: 2.7 g/dL (ref 1.5–4.5)
Glucose: 74 mg/dL (ref 70–99)
Potassium: 4.3 mmol/L (ref 3.5–5.2)
Sodium: 141 mmol/L (ref 134–144)
Total Protein: 7 g/dL (ref 6.0–8.5)
eGFR: 57 mL/min/{1.73_m2} — ABNORMAL LOW (ref >59)

## 2024-05-24 LAB — HCV INTERPRETATION

## 2024-05-24 LAB — FERRITIN: Ferritin: 100 ng/mL (ref 15–150)

## 2024-05-24 LAB — RPR: RPR Ser Ql: NONREACTIVE

## 2024-05-24 LAB — HIV ANTIBODY (ROUTINE TESTING W REFLEX): HIV Screen 4th Generation wRfx: NONREACTIVE

## 2024-05-24 LAB — URINE CULTURE: Organism ID, Bacteria: NO GROWTH

## 2024-05-24 LAB — HCV AB W REFLEX TO QUANT PCR: HCV Ab: NONREACTIVE

## 2024-05-28 ENCOUNTER — Ambulatory Visit: Admitting: Student

## 2024-06-03 ENCOUNTER — Other Ambulatory Visit: Payer: Self-pay | Admitting: Registered Nurse

## 2024-06-04 ENCOUNTER — Other Ambulatory Visit: Payer: Self-pay | Admitting: Registered Nurse

## 2024-06-04 ENCOUNTER — Ambulatory Visit

## 2024-06-05 ENCOUNTER — Encounter: Attending: Registered Nurse | Admitting: Registered Nurse

## 2024-06-05 ENCOUNTER — Ambulatory Visit
Admission: RE | Admit: 2024-06-05 | Discharge: 2024-06-05 | Disposition: A | Discharge status: Home / Self Care | Source: Ambulatory Visit | Attending: Family Medicine | Admitting: Family Medicine

## 2024-06-05 ENCOUNTER — Encounter: Payer: Self-pay | Admitting: Registered Nurse

## 2024-06-05 VITALS — BP 117/68 | HR 50 | Ht 62.0 in | Wt 191.8 lb

## 2024-06-05 DIAGNOSIS — R001 Bradycardia, unspecified: Secondary | ICD-10-CM | POA: Diagnosis not present

## 2024-06-05 DIAGNOSIS — M5416 Radiculopathy, lumbar region: Secondary | ICD-10-CM | POA: Insufficient documentation

## 2024-06-05 DIAGNOSIS — Z79891 Long term (current) use of opiate analgesic: Secondary | ICD-10-CM | POA: Insufficient documentation

## 2024-06-05 DIAGNOSIS — Z5181 Encounter for therapeutic drug level monitoring: Secondary | ICD-10-CM | POA: Diagnosis not present

## 2024-06-05 DIAGNOSIS — M5412 Radiculopathy, cervical region: Secondary | ICD-10-CM | POA: Insufficient documentation

## 2024-06-05 DIAGNOSIS — Z1231 Encounter for screening mammogram for malignant neoplasm of breast: Secondary | ICD-10-CM

## 2024-06-05 DIAGNOSIS — R202 Paresthesia of skin: Secondary | ICD-10-CM | POA: Insufficient documentation

## 2024-06-05 DIAGNOSIS — M542 Cervicalgia: Secondary | ICD-10-CM | POA: Diagnosis not present

## 2024-06-05 DIAGNOSIS — M7918 Myalgia, other site: Secondary | ICD-10-CM | POA: Insufficient documentation

## 2024-06-05 DIAGNOSIS — M546 Pain in thoracic spine: Secondary | ICD-10-CM | POA: Insufficient documentation

## 2024-06-05 DIAGNOSIS — G8929 Other chronic pain: Secondary | ICD-10-CM | POA: Diagnosis not present

## 2024-06-05 DIAGNOSIS — G4709 Other insomnia: Secondary | ICD-10-CM | POA: Insufficient documentation

## 2024-06-05 DIAGNOSIS — G894 Chronic pain syndrome: Secondary | ICD-10-CM | POA: Insufficient documentation

## 2024-06-05 MED ORDER — HYDROCODONE-ACETAMINOPHEN 7.5-325 MG PO TABS: ORAL_TABLET | ORAL | 0 refills | Status: DC
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
## 2024-06-10 LAB — DRUG TOX MONITOR 1 W/CONF, ORAL FLD
Amphetamines: NEGATIVE ng/mL (ref <10)
Barbiturates: NEGATIVE ng/mL (ref <10)
Benzodiazepines: NEGATIVE ng/mL (ref <0.50)
Buprenorphine: NEGATIVE ng/mL (ref <0.10)
Cocaine: NEGATIVE ng/mL (ref <5.0)
Codeine: NEGATIVE ng/mL (ref <2.5)
Dihydrocodeine: 8.3 ng/mL — ABNORMAL HIGH (ref <2.5)
Fentanyl: NEGATIVE ng/mL (ref <0.10)
Heroin Metabolite: NEGATIVE ng/mL (ref <1.0)
Hydrocodone: 69.9 ng/mL — ABNORMAL HIGH (ref <2.5)
Hydromorphone: NEGATIVE ng/mL (ref <2.5)
MARIJUANA: NEGATIVE ng/mL (ref <2.5)
MDMA: NEGATIVE ng/mL (ref <10)
Meprobamate: NEGATIVE ng/mL (ref <2.5)
Methadone: NEGATIVE ng/mL (ref <5.0)
Morphine: NEGATIVE ng/mL (ref <2.5)
Nicotine Metabolite: NEGATIVE ng/mL (ref <5.0)
Norhydrocodone: 2.8 ng/mL — ABNORMAL HIGH (ref <2.5)
Noroxycodone: NEGATIVE ng/mL (ref <2.5)
Opiates: POSITIVE ng/mL — AB (ref <2.5)
Oxycodone: NEGATIVE ng/mL (ref <2.5)
Oxymorphone: NEGATIVE ng/mL (ref <2.5)
Phencyclidine: NEGATIVE ng/mL (ref <10)
Tapentadol: NEGATIVE ng/mL (ref <5.0)
Tramadol: NEGATIVE ng/mL (ref <5.0)
Zolpidem: NEGATIVE ng/mL (ref <5.0)

## 2024-06-10 LAB — DRUG TOX ALC METAB W/CON, ORAL FLD: Alcohol Metabolite: NEGATIVE ng/mL (ref <25)

## 2024-06-16 ENCOUNTER — Other Ambulatory Visit: Payer: Self-pay | Admitting: Physician Assistant

## 2024-06-16 ENCOUNTER — Ambulatory Visit: Admitting: Student

## 2024-06-16 ENCOUNTER — Telehealth: Payer: Self-pay

## 2024-06-16 ENCOUNTER — Encounter: Payer: Self-pay | Admitting: Student

## 2024-06-16 VITALS — BP 107/64 | HR 55 | Ht 62.0 in | Wt 196.6 lb

## 2024-06-16 DIAGNOSIS — I5030 Unspecified diastolic (congestive) heart failure: Secondary | ICD-10-CM | POA: Diagnosis not present

## 2024-06-16 DIAGNOSIS — M7989 Other specified soft tissue disorders: Secondary | ICD-10-CM | POA: Diagnosis not present

## 2024-06-16 DIAGNOSIS — R400 Somnolence: Secondary | ICD-10-CM | POA: Diagnosis not present

## 2024-06-16 MED ORDER — FUROSEMIDE 20 MG PO TABS: 40.0000 mg | ORAL_TABLET | Freq: Every day | ORAL | 0 refills | Status: DC | PRN

## 2024-06-16 NOTE — Progress Notes (Signed)
    SUBJECTIVE:   CHIEF COMPLAINT / HPI:   Bilateral leg swelling Seen in office on 05/22/2024 for leg cramps with asymmetric swelling and popliteal tenderness.  DVT ultrasound ordered at that time, which was negative.  She has bilateral leg swelling, only mildly worse than the left.  Denies pain with ambulation today.  No new trauma.  Does not take her Lasix , reports she has been without this for quite some time.  She is taking Lasix  40 mg as needed for leg swelling.  Additionally she is having some orthopnea.  Denies chest pain, SOB at rest, dizziness, NVD. She was due for a repeat echocardiogram, does not appear this was ever scheduled for her.  Drowsy Patient is drowsy on exam today.  Reportedly took Lyrica , Norco, tizanidine  and her blood pressure medications right before her visit with me.  Daughter is with mother today, reports that this is normal for her-happens whenever she takes her medications.  Patient was nodding off during the encounter, but wakes easily-and is not confused or disoriented.  She has no symptoms aside from her leg swelling.  OBJECTIVE:   BP 107/64   Pulse (!) 55   Ht 5' 2 (1.575 m)   Wt 196 lb 9.6 oz (89.2 kg)   LMP 10/22/2014 (Approximate)   SpO2 100%   BMI 35.96 kg/m    General: NAD, pleasant HEENT: Normocephalic, atraumatic head. Cardio: RRR, no MRG.  Bilateral +1 pitting edema. Respiratory: CTAB, normal wob on RA GI: Abdomen is soft, not tender, not distended. BS present Skin: Warm and dry Neuro: Drowsy.  Oriented x 4.  ASSESSMENT/PLAN:   Assessment & Plan Heart failure with preserved ejection fraction, unspecified HF chronicity (HCC) Acute heart failure exacerbation versus baseline.  At this time vitals stable, (bradycardia is her normal per chart review, and she is asymptomatic from a cardiac standpoint).  Normal work of breathing and adequate SpO2 on room air.  Do not believe she needs urgent evaluation in the ED. - Lasix  40 mg daily as needed  for leg swelling - BMP, BNP - Follow-up in 2-4 weeks - ED precautions discussed Leg swelling Differential includes: Venous insufficiency, renal disease, heart failure.  Low concern for DVT without pain/bilateral/recent DVT ultrasound negative. Plan as above. Drowsy Highly suspect polypharmacy.  Patient is with daughter.  Counseled daughter and patient to refrain from taking all of her medications at once particularly her tizanidine /Lyrica /Norco and blood pressure medications.  Patient is protecting her airway, speaking full sentences and per collateral at bedside this has happened before with medications. - Counseled extensively on return/ED precautions with daughter and patient   Gladis Church, DO [facility]

## 2024-06-16 NOTE — Progress Notes (Deleted)
    SUBJECTIVE:   CHIEF COMPLAINT / HPI:   ***  PERTINENT  PMH / PSH: HFpEF, nonrheumatic aortic valve insufficiency, HTN, s/p aortic aneurysm repair,  OBJECTIVE:   LMP 10/22/2014 (Approximate)   ***  ASSESSMENT/PLAN:   Assessment & Plan Heart failure with preserved ejection fraction, unspecified HF chronicity (HCC)      Gladis Church, DO [facility]

## 2024-06-16 NOTE — Telephone Encounter (Signed)
 Patient calls nurse line requesting refill on Lasix .   She reports that for the last 3 days, she has been experiencing swelling in left leg. She denies pain, redness or warmth to the area. She has noticed difficulties with ambulation due to the swelling.   Furosemide  has not been prescribed since 2023. Advised patient that she would need an appointment for further evaluation.   Scheduled patient for same day visit with Dr. Howell.   ED precautions discussed.   Chiquita JAYSON English, RN

## 2024-06-16 NOTE — Patient Instructions (Signed)
 It was great to see you! Thank you for allowing me to participate in your care!   I recommend that you always bring your medications to each appointment as this makes it easy to ensure we are on the correct medications and helps us  not miss when refills are needed.  Our plans for today:  - Please do not take your pain medications at the same time if they are causing you to feel drowsy.  You can space them an hour or 2 apart, and see how you do.  -Please take 40 mg of Lasix  every day for your leg swelling  -If you develop severe shortness of breath, difficulty breathing, chest pain, I recommend you go to the emergency room immediately.  - We are checking some labs today, I will call you if they are abnormal will send you a MyChart message or a letter if they are normal.  If you do not hear about your labs in the next 2 weeks please let us  know.  -Follow-up in 2 to 4 weeks  Take care and seek immediate care sooner if you develop any concerns. Please remember to show up 15 minutes before your scheduled appointment time!  Gladis Church, DO [facility]

## 2024-06-16 NOTE — Assessment & Plan Note (Signed)
 Acute heart failure exacerbation versus baseline.  At this time vitals stable, (bradycardia is her normal per chart review, and she is asymptomatic from a cardiac standpoint).  Normal work of breathing and adequate SpO2 on room air.  Do not believe she needs urgent evaluation in the ED. - Lasix  40 mg daily as needed for leg swelling - BMP, BNP - Follow-up in 2-4 weeks - ED precautions discussed

## 2024-06-17 ENCOUNTER — Ambulatory Visit: Payer: Self-pay | Admitting: Student

## 2024-06-17 LAB — BASIC METABOLIC PANEL WITH GFR
BUN/Creatinine Ratio: 19 (ref 12–28)
BUN: 26 mg/dL (ref 8–27)
CO2: 22 mmol/L (ref 20–29)
Calcium: 8.9 mg/dL (ref 8.7–10.3)
Chloride: 103 mmol/L (ref 96–106)
Creatinine, Ser: 1.39 mg/dL — ABNORMAL HIGH (ref 0.57–1.00)
Glucose: 86 mg/dL (ref 70–99)
Potassium: 4.3 mmol/L (ref 3.5–5.2)
Sodium: 138 mmol/L (ref 134–144)
eGFR: 43 mL/min/{1.73_m2} — ABNORMAL LOW (ref >59)

## 2024-06-17 LAB — BRAIN NATRIURETIC PEPTIDE: BNP: 60.5 pg/mL (ref 0.0–100.0)

## 2024-06-19 ENCOUNTER — Ambulatory Visit: Admitting: Family Medicine

## 2024-06-19 ENCOUNTER — Ambulatory Visit

## 2024-06-19 VITALS — BP 147/78 | HR 71 | Ht 62.0 in | Wt 191.2 lb

## 2024-06-19 DIAGNOSIS — H1013 Acute atopic conjunctivitis, bilateral: Secondary | ICD-10-CM | POA: Diagnosis not present

## 2024-06-19 MED ORDER — EPINEPHRINE 0.3 MG/0.3ML IJ SOAJ: 0.3000 mg | INTRAMUSCULAR | 1 refills | Status: AC | PRN

## 2024-06-19 MED ORDER — OLOPATADINE HCL 0.2 % OP SOLN: 1.0000 [drp] | Freq: Every day | OPHTHALMIC | 1 refills | Status: AC

## 2024-06-19 NOTE — Patient Instructions (Signed)
 Good to see you today - Thank you for coming in  Things we discussed today:  1) Your eye swelling is most likely due allergies, which could be from pollen or your bug spray. - Continue taking loratidine once a day - Also apply 1 drop of olapatadine in your eye once a day  Please come back if you start having - fever - pain with moving your eyes - changes in your vision

## 2024-06-19 NOTE — Progress Notes (Signed)
    SUBJECTIVE:   CHIEF COMPLAINT / HPI:   Maria Griffith is a 63yo F that pf eye swelling - Was her birthday yesterday, felt normal all day - Was spraying a lot of peppermint bug spray and thinks she got it in her eyes - Woke up this morning with both eyes itchy, watery, and swollen BL - No vision changes or pain w/ eye movement - No purulent drainage - She has been rubbing her eyes a lot due to itchiness - No feeling of grit in her eye    OBJECTIVE:   BP (!) 147/78   Pulse 71   Ht 5' 2 (1.575 m)   Wt 191 lb 3.2 oz (86.7 kg)   LMP 10/22/2014 (Approximate)   SpO2 100%   BMI 34.97 kg/m   General: Alert, pleasant woman. NAD. HEENT: NCAT. MMM. BL conunctiva injected. PERRLA. Watery drainage BL. EOMI, nonpainful. Eyelids edematous.  CV: RRR, no murmurs. Resp: CTAB, no wheezing or crackles. Normal WOB on RA.  Abm: Soft, nontender, nondistended. BS present. Ext: Moves all ext spontaneously Skin: Warm, well perfused     ASSESSMENT/PLAN:   Assessment & Plan Allergic conjunctivitis of both eyes Exam most cw allergic conjunctivitis (watery drianage, BL, itchy). Less likely infectious such as cellulitis or bacterial/viral conjunct (no fever, EOMI, no purulence) - Cont daily loratidine - Start olapatadine drops daily - return precautions discussed   Maria Nearing, MD [facility]

## 2024-06-30 ENCOUNTER — Other Ambulatory Visit: Payer: Self-pay | Admitting: Registered Nurse

## 2024-07-02 ENCOUNTER — Encounter: Admitting: Registered Nurse

## 2024-07-03 ENCOUNTER — Other Ambulatory Visit: Payer: Self-pay | Admitting: Registered Nurse

## 2024-07-03 ENCOUNTER — Other Ambulatory Visit: Payer: Self-pay | Admitting: Family Medicine
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
## 2024-07-04 ENCOUNTER — Encounter: Admitting: Registered Nurse

## 2024-07-04 ENCOUNTER — Telehealth: Payer: Self-pay | Admitting: Registered Nurse

## 2024-07-04 DIAGNOSIS — G8929 Other chronic pain: Secondary | ICD-10-CM

## 2024-07-04 DIAGNOSIS — G894 Chronic pain syndrome: Secondary | ICD-10-CM

## 2024-07-04 MED ORDER — HYDROCODONE-ACETAMINOPHEN 7.5-325 MG PO TABS: ORAL_TABLET | ORAL | 0 refills | Status: DC

## 2024-07-04 NOTE — Telephone Encounter (Signed)
PMP was Reviewed.  Hydrocodone e-scribed to pharmacy.

## 2024-07-04 NOTE — Telephone Encounter (Signed)
 P needs refill of meds got her rescheduled to august

## 2024-07-16 ENCOUNTER — Ambulatory Visit (HOSPITAL_COMMUNITY): Admission: RE | Admit: 2024-07-16 | Source: Ambulatory Visit

## 2024-07-23 NOTE — Progress Notes (Signed)
 [facility] Quality Team Note  Name: Maria Griffith Date of Birth: 1961/05/20 MRN: [id number] Date: 07/23/2024  [facility] Quality Team has reviewed this patient's chart, please see recommendations below:  [facility] Quality Other; (CHART REVIEWED FOR CBP, LAST ONE OUT OF RANGE)
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
## 2024-07-24 ENCOUNTER — Other Ambulatory Visit: Payer: Self-pay | Admitting: Registered Nurse

## 2024-07-24 DIAGNOSIS — G8929 Other chronic pain: Secondary | ICD-10-CM

## 2024-07-24 DIAGNOSIS — G894 Chronic pain syndrome: Secondary | ICD-10-CM

## 2024-07-25 ENCOUNTER — Encounter: Admitting: Registered Nurse

## 2024-07-25 NOTE — Telephone Encounter (Signed)
 PMP was Reviewed.  Last Hydrocodone  was filled on 07/04/2024. She has a scheduled appointment on 07/28/2024. Refill to early.

## 2024-07-28 ENCOUNTER — Encounter: Payer: Self-pay | Admitting: Registered Nurse

## 2024-07-28 ENCOUNTER — Encounter: Attending: Registered Nurse | Admitting: Registered Nurse

## 2024-07-28 VITALS — BP 116/69 | HR 62 | Ht 62.0 in | Wt 197.0 lb

## 2024-07-28 DIAGNOSIS — M7918 Myalgia, other site: Secondary | ICD-10-CM | POA: Insufficient documentation

## 2024-07-28 DIAGNOSIS — R202 Paresthesia of skin: Secondary | ICD-10-CM | POA: Insufficient documentation

## 2024-07-28 DIAGNOSIS — M5416 Radiculopathy, lumbar region: Secondary | ICD-10-CM | POA: Diagnosis not present

## 2024-07-28 DIAGNOSIS — G4709 Other insomnia: Secondary | ICD-10-CM | POA: Insufficient documentation

## 2024-07-28 DIAGNOSIS — M546 Pain in thoracic spine: Secondary | ICD-10-CM | POA: Insufficient documentation

## 2024-07-28 DIAGNOSIS — G894 Chronic pain syndrome: Secondary | ICD-10-CM | POA: Insufficient documentation

## 2024-07-28 DIAGNOSIS — M542 Cervicalgia: Secondary | ICD-10-CM | POA: Diagnosis not present

## 2024-07-28 DIAGNOSIS — M5412 Radiculopathy, cervical region: Secondary | ICD-10-CM | POA: Diagnosis not present

## 2024-07-28 DIAGNOSIS — Z5181 Encounter for therapeutic drug level monitoring: Secondary | ICD-10-CM | POA: Diagnosis not present

## 2024-07-28 DIAGNOSIS — G8929 Other chronic pain: Secondary | ICD-10-CM | POA: Insufficient documentation

## 2024-07-28 DIAGNOSIS — Z79891 Long term (current) use of opiate analgesic: Secondary | ICD-10-CM | POA: Insufficient documentation

## 2024-07-28 MED ORDER — PREGABALIN 100 MG PO CAPS: 100.0000 mg | ORAL_CAPSULE | Freq: Three times a day (TID) | ORAL | 3 refills | Status: DC

## 2024-07-28 MED ORDER — LIDOCAINE 5 % EX PTCH: MEDICATED_PATCH | CUTANEOUS | 1 refills | Status: DC

## 2024-07-28 MED ORDER — TRAZODONE HCL 150 MG PO TABS: 150.0000 mg | ORAL_TABLET | Freq: Every day | ORAL | 3 refills | Status: AC

## 2024-07-28 MED ORDER — HYDROCODONE-ACETAMINOPHEN 7.5-325 MG PO TABS: ORAL_TABLET | ORAL | 0 refills | Status: DC
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
## 2024-07-29 ENCOUNTER — Other Ambulatory Visit: Payer: Self-pay | Admitting: Student

## 2024-07-30 ENCOUNTER — Other Ambulatory Visit: Payer: Self-pay | Admitting: Registered Nurse

## 2024-08-12 ENCOUNTER — Ambulatory Visit: Admitting: Family Medicine

## 2024-08-13 ENCOUNTER — Other Ambulatory Visit (HOSPITAL_COMMUNITY)
Admission: RE | Admit: 2024-08-13 | Discharge: 2024-08-13 | Disposition: A | Discharge status: Home / Self Care | Source: Ambulatory Visit | Attending: Family Medicine | Admitting: Family Medicine

## 2024-08-13 ENCOUNTER — Ambulatory Visit (INDEPENDENT_AMBULATORY_CARE_PROVIDER_SITE_OTHER): Admitting: Family Medicine

## 2024-08-13 VITALS — BP 128/69 | HR 63 | Ht 62.0 in | Wt 198.8 lb

## 2024-08-13 DIAGNOSIS — N898 Other specified noninflammatory disorders of vagina: Secondary | ICD-10-CM | POA: Insufficient documentation

## 2024-08-13 DIAGNOSIS — Z113 Encounter for screening for infections with a predominantly sexual mode of transmission: Secondary | ICD-10-CM

## 2024-08-13 LAB — POCT WET PREP (WET MOUNT)
Clue Cells Wet Prep Whiff POC: NEGATIVE
Trichomonas Wet Prep HPF POC: ABSENT

## 2024-08-13 MED ORDER — FUROSEMIDE 20 MG PO TABS: 40.0000 mg | ORAL_TABLET | Freq: Every day | ORAL | 0 refills | Status: DC | PRN

## 2024-08-13 MED ORDER — NYSTATIN 100000 UNIT/GM EX OINT: 1.0000 | TOPICAL_OINTMENT | Freq: Two times a day (BID) | CUTANEOUS | 0 refills | Status: DC

## 2024-08-13 NOTE — Progress Notes (Signed)
    SUBJECTIVE:   CHIEF COMPLAINT / HPI:   Vaginal itching Itching and burning both internally, as well as in the skin folds at top of thigh. Has been using neosporin to this area.  No recent sexual activity. Denies odor, discharge. Would also like to check for HIV, syphilis.  PERTINENT  PMH / PSH: Reviewed. HTN HFpEF CKD  OBJECTIVE:   BP 128/69   Pulse 63   Ht 5' 2 (1.575 m)   Wt 198 lb 12.8 oz (90.2 kg)   LMP 10/22/2014 (Approximate)   SpO2 99%   BMI 36.36 kg/m   General: well-appearing, no acute distress. HEENT: normocephalic, PERRLA, EOM grossly intact. Pulm: No increased work of breathing. Extremities:  Moves all extremities equally. Neuro: Alert and oriented x3, speech normal in content. Psych:  Cognition and judgment appear intact.  GU Exam:  External exam: Normal-appearing female external genitalia.   Vaginal exam notable for normal vaginal ruggae.  Cervix without discharge or obvious lesion.  Externally does have bilateral areas of mild irritation in thigh creases. Chaperoned exam, CMA [place].    ASSESSMENT/PLAN:   Assessment & Plan Vaginal itching Suspect yeast versus BV. Low concern for STI without recent sexual activity. Do suspect yeast in inner thigh creases. - swab and blood draw for STI, yeast, BV; will let her know results - nystatin  ointment BID to affected areas; stop neosporin use    Lauraine Norse, DO [facility]

## 2024-08-13 NOTE — Patient Instructions (Signed)
 It was so good to see you today! Thank you for allowing me to take care of you.  Today we discussed the following concerns and plans:  Vaginal itching - I will let you know the results of your test - stop using neosporin - I have sent in a nystatin  cream to the pharmacy; use this twice a day in itching areas.  If you have any concerns, please call the clinic or schedule an appointment.  It was a pleasure to take care of you today. Be well!  Lauraine Norse, DO [facility], PGY-2  Do you need your medications delivered to your home?   We'll send your prescription to the [facility] for delivery.          Address: [address] [place], [place], [place] [address]          Phone: [phone]  Please call the [facility] to speak with a pharmacist and set up your home medication delivery. If you have any questions, feel free to contact us  -- we're happy to help!  Other [place] [facility]acies that offer affordable prices on both prescriptions and over-the-counter items, as well as convenient services like vaccinations, are  [facility], at [facility]         Address:  [address], [place], [place] [address]         Phone: [phone]  [facility], located in the [facility]        Address: [address] [place], [place] [address]        Phone: [phone]  [facility], at [facility]       Address: [address], [place], [place] [address]       Phone: [phone]  [facility], at [facility]       Address: [address], [place], [place] [address]       Phone: [phone]

## 2024-08-14 ENCOUNTER — Ambulatory Visit: Payer: Self-pay | Admitting: Family Medicine

## 2024-08-14 LAB — HIV ANTIBODY (ROUTINE TESTING W REFLEX): HIV Screen 4th Generation wRfx: NONREACTIVE

## 2024-08-14 LAB — SYPHILIS: RPR W/REFLEX TO RPR TITER AND TREPONEMAL ANTIBODIES, TRADITIONAL SCREENING AND DIAGNOSIS ALGORITHM: RPR Ser Ql: NONREACTIVE

## 2024-08-15 LAB — CERVICOVAGINAL ANCILLARY ONLY
Bacterial Vaginitis (gardnerella): NEGATIVE
Candida Glabrata: NEGATIVE
Candida Vaginitis: NEGATIVE
Chlamydia: NEGATIVE
Comment: NEGATIVE
Comment: NEGATIVE
Comment: NEGATIVE
Comment: NEGATIVE
Comment: NEGATIVE
Comment: NORMAL
Neisseria Gonorrhea: NEGATIVE
Trichomonas: NEGATIVE

## 2024-08-20 ENCOUNTER — Ambulatory Visit (HOSPITAL_COMMUNITY)
Admission: RE | Admit: 2024-08-20 | Discharge: 2024-08-20 | Disposition: A | Discharge status: Home / Self Care | Source: Ambulatory Visit | Attending: Family Medicine | Admitting: Family Medicine

## 2024-08-20 DIAGNOSIS — I130 Hypertensive heart and chronic kidney disease with heart failure and stage 1 through stage 4 chronic kidney disease, or unspecified chronic kidney disease: Secondary | ICD-10-CM | POA: Insufficient documentation

## 2024-08-20 DIAGNOSIS — N1832 Chronic kidney disease, stage 3b: Secondary | ICD-10-CM | POA: Diagnosis not present

## 2024-08-20 DIAGNOSIS — I351 Nonrheumatic aortic (valve) insufficiency: Secondary | ICD-10-CM | POA: Diagnosis not present

## 2024-08-20 DIAGNOSIS — R0609 Other forms of dyspnea: Secondary | ICD-10-CM

## 2024-08-20 DIAGNOSIS — I5030 Unspecified diastolic (congestive) heart failure: Secondary | ICD-10-CM | POA: Insufficient documentation

## 2024-08-20 DIAGNOSIS — R0600 Dyspnea, unspecified: Secondary | ICD-10-CM | POA: Insufficient documentation

## 2024-08-20 LAB — ECHOCARDIOGRAM COMPLETE
AR max vel: 3.26 cm2
AV Peak grad: 6 mmHg
Ao pk vel: 1.22 m/s
Area-P 1/2: 3.99 cm2
S' Lateral: 2.5 cm

## 2024-08-20 NOTE — Progress Notes (Signed)
 Echocardiogram 2D Echocardiogram has been performed.  Thea Norlander 08/20/2024, 3:54 PM

## 2024-08-28 ENCOUNTER — Other Ambulatory Visit: Payer: Self-pay | Admitting: Family Medicine

## 2024-08-28 DIAGNOSIS — R110 Nausea: Secondary | ICD-10-CM
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
## 2024-08-29 ENCOUNTER — Telehealth: Payer: Self-pay | Admitting: Registered Nurse

## 2024-08-29 ENCOUNTER — Other Ambulatory Visit: Payer: Self-pay | Admitting: Family Medicine

## 2024-08-29 ENCOUNTER — Other Ambulatory Visit: Payer: Self-pay | Admitting: Registered Nurse

## 2024-08-29 ENCOUNTER — Encounter: Admitting: Registered Nurse

## 2024-08-29 DIAGNOSIS — G8929 Other chronic pain: Secondary | ICD-10-CM

## 2024-08-29 DIAGNOSIS — M542 Cervicalgia: Secondary | ICD-10-CM

## 2024-08-29 DIAGNOSIS — G894 Chronic pain syndrome: Secondary | ICD-10-CM

## 2024-08-29 MED ORDER — HYDROCODONE-ACETAMINOPHEN 7.5-325 MG PO TABS
ORAL_TABLET | ORAL | 0 refills | Status: DC
Start: 2024-08-29 — End: 2024-09-24
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
## 2024-08-29 NOTE — Telephone Encounter (Signed)
PMP was Reviewed.  Hydrocodone e-scribed to pharmacy.

## 2024-09-01 ENCOUNTER — Encounter: Payer: Self-pay | Admitting: Registered Nurse

## 2024-09-01 ENCOUNTER — Encounter: Attending: Registered Nurse | Admitting: Registered Nurse

## 2024-09-01 VITALS — BP 91/61 | HR 60 | Ht 62.0 in | Wt 187.0 lb

## 2024-09-01 DIAGNOSIS — G894 Chronic pain syndrome: Secondary | ICD-10-CM | POA: Insufficient documentation

## 2024-09-01 DIAGNOSIS — R202 Paresthesia of skin: Secondary | ICD-10-CM | POA: Insufficient documentation

## 2024-09-01 DIAGNOSIS — Z79891 Long term (current) use of opiate analgesic: Secondary | ICD-10-CM | POA: Diagnosis not present

## 2024-09-01 DIAGNOSIS — M546 Pain in thoracic spine: Secondary | ICD-10-CM | POA: Insufficient documentation

## 2024-09-01 DIAGNOSIS — G8929 Other chronic pain: Secondary | ICD-10-CM | POA: Diagnosis not present

## 2024-09-01 DIAGNOSIS — Z5181 Encounter for therapeutic drug level monitoring: Secondary | ICD-10-CM | POA: Diagnosis not present

## 2024-09-01 DIAGNOSIS — M5416 Radiculopathy, lumbar region: Secondary | ICD-10-CM | POA: Insufficient documentation

## 2024-09-01 DIAGNOSIS — G4709 Other insomnia: Secondary | ICD-10-CM | POA: Diagnosis not present

## 2024-09-01 DIAGNOSIS — M542 Cervicalgia: Secondary | ICD-10-CM | POA: Insufficient documentation

## 2024-09-01 DIAGNOSIS — M5412 Radiculopathy, cervical region: Secondary | ICD-10-CM | POA: Insufficient documentation

## 2024-09-01 DIAGNOSIS — M7918 Myalgia, other site: Secondary | ICD-10-CM | POA: Insufficient documentation

## 2024-09-04 ENCOUNTER — Telehealth: Payer: Self-pay

## 2024-09-04 LAB — DRUG TOX MONITOR 1 W/CONF, ORAL FLD
Amphetamines: NEGATIVE ng/mL (ref <10)
Barbiturates: NEGATIVE ng/mL (ref <10)
Benzodiazepines: NEGATIVE ng/mL (ref <0.50)
Buprenorphine: NEGATIVE ng/mL (ref <0.10)
Cocaine: NEGATIVE ng/mL (ref <5.0)
Codeine: NEGATIVE ng/mL (ref <2.5)
Dihydrocodeine: NEGATIVE ng/mL (ref <2.5)
Fentanyl: NEGATIVE ng/mL (ref <0.10)
Heroin Metabolite: NEGATIVE ng/mL (ref <1.0)
Hydrocodone: 28.4 ng/mL — ABNORMAL HIGH (ref <2.5)
Hydromorphone: NEGATIVE ng/mL (ref <2.5)
MARIJUANA: NEGATIVE ng/mL (ref <2.5)
MDMA: NEGATIVE ng/mL (ref <10)
Meprobamate: NEGATIVE ng/mL (ref <2.5)
Methadone: NEGATIVE ng/mL (ref <5.0)
Morphine: NEGATIVE ng/mL (ref <2.5)
Nicotine Metabolite: NEGATIVE ng/mL (ref <5.0)
Norhydrocodone: NEGATIVE ng/mL (ref <2.5)
Noroxycodone: NEGATIVE ng/mL (ref <2.5)
Opiates: POSITIVE ng/mL — AB (ref <2.5)
Oxycodone: NEGATIVE ng/mL (ref <2.5)
Oxymorphone: NEGATIVE ng/mL (ref <2.5)
Phencyclidine: NEGATIVE ng/mL (ref <10)
Tapentadol: NEGATIVE ng/mL (ref <5.0)
Tramadol: NEGATIVE ng/mL (ref <5.0)
Zolpidem: NEGATIVE ng/mL (ref <5.0)

## 2024-09-04 LAB — DRUG TOX ALC METAB W/CON, ORAL FLD: Alcohol Metabolite: NEGATIVE ng/mL (ref <25)

## 2024-09-04 NOTE — Telephone Encounter (Signed)
    Primary Cardiologist: Lonni Cash, MD  Chart reviewed as part of pre-operative protocol coverage. Simple dental extractions are considered low risk procedures per guidelines and generally do not require any specific cardiac clearance. It is also generally accepted that for simple extractions and dental cleanings, there is no need to interrupt blood thinner therapy.   SBE prophylaxis is not required for the patient.  I will route this recommendation to the requesting party via Epic fax function and remove from pre-op pool.  Please call with questions.  Orren LOISE Fabry, PA-C 09/04/2024, 2:04 PM

## 2024-09-04 NOTE — Telephone Encounter (Signed)
   Pre-operative Risk Assessment    Patient Name: Maria Griffith  DOB: 1961-08-12 MRN: [id number]   Date of last office visit: 11/12/23 ARTIST POUCH, PA-C Date of next office visit: NONE  Request for Surgical Clearance    Procedure:  ROUTINE DENTAL CARE AND FILLINGS  Date of Surgery:  Clearance TBD                                Surgeon:  JODINE AMADEO RUTH, DDS Surgeon's Group or Practice Name:  [facility] Phone number:  [phone] Fax number:  [phone]   Type of Clearance Requested:   - Medical  - Pharmacy:  Hold Aspirin      Type of Anesthesia:  Not Indicated   Additional requests/questions:    Signed, Lucie DELENA Ku   09/04/2024, 1:10 PM

## 2024-09-05 ENCOUNTER — Ambulatory Visit (INDEPENDENT_AMBULATORY_CARE_PROVIDER_SITE_OTHER): Admitting: Family Medicine

## 2024-09-05 ENCOUNTER — Encounter: Payer: Self-pay | Admitting: Family Medicine

## 2024-09-05 VITALS — BP 111/70 | HR 58 | Ht 62.0 in | Wt 196.4 lb

## 2024-09-05 DIAGNOSIS — R252 Cramp and spasm: Secondary | ICD-10-CM | POA: Diagnosis not present

## 2024-09-05 DIAGNOSIS — M545 Low back pain, unspecified: Secondary | ICD-10-CM

## 2024-09-05 DIAGNOSIS — R29898 Other symptoms and signs involving the musculoskeletal system: Secondary | ICD-10-CM | POA: Diagnosis not present

## 2024-09-05 DIAGNOSIS — D709 Neutropenia, unspecified: Secondary | ICD-10-CM

## 2024-09-05 DIAGNOSIS — F339 Major depressive disorder, recurrent, unspecified: Secondary | ICD-10-CM

## 2024-09-05 DIAGNOSIS — R5383 Other fatigue: Secondary | ICD-10-CM

## 2024-09-05 DIAGNOSIS — F331 Major depressive disorder, recurrent, moderate: Secondary | ICD-10-CM

## 2024-09-05 DIAGNOSIS — Z23 Encounter for immunization: Secondary | ICD-10-CM

## 2024-09-05 DIAGNOSIS — N189 Chronic kidney disease, unspecified: Secondary | ICD-10-CM

## 2024-09-05 MED ORDER — NYSTATIN 100000 UNIT/GM EX OINT: 1.0000 | TOPICAL_OINTMENT | Freq: Two times a day (BID) | CUTANEOUS | 1 refills | Status: DC | PRN

## 2024-09-05 NOTE — Progress Notes (Addendum)
 SUBJECTIVE:   CHIEF COMPLAINT / HPI:   Discussed the use of AI scribe software for clinical note transcription with the patient, who gave verbal consent to proceed.  History of Present Illness   Maria Griffith is a 63 year old female with chronic pain and fatigue who presents with worsening leg cramps and tiredness.  She experiences leg cramps and spasms occurring at least one to two times a week, starting from the top of both legs and sometimes severe enough to cause her to scream. The spasms often occur when she stretches. She uses Lyrica , which calms the cramps but does not completely relieve the pain, and tizanidine  for muscle spasms.  She feels extremely tired and out of breath with minimal exertion, such as getting up and doing small tasks. She experiences back pain, which she suspects might be nerve-related. She takes Lyrica  three times a day and tizanidine  three times a day, which provides some relief. Resting helps alleviate the symptoms temporarily.  She mentions mood changes, feeling 'mean' towards her children, and difficulty concentrating on tasks, which she attributes to possible medication side effects.  She experiences occasional chest pain, which she associates with stress or exertion, and it improves with rest. The patient reports no history of heart attack or stroke. An echocardiogram in September was normal.  She reports swelling in her legs for the past two months, somewhat alleviated by Lasix . She tries to limit her salt intake to manage the swelling.  She has a history of a rash under her belly, which itches and sometimes becomes irritated. She uses nystatin  cream, which has helped improve the condition, and diaper cream for relief.  She is currently taking multiple medications including amlodipine , losartan , metoprolol , oxybutynin , Lyrica , tizanidine , trazodone , and Symbicort . She uses Tylenol , albuterol , hydrocodone , and hydroxyzine  as needed. She takes vitamin D   over the counter and Lasix  for swelling.       PERTINENT  PMH / PSH: pmhx reviewed  OBJECTIVE:   BP 111/70   Pulse (!) 58   Ht 5' 2 (1.575 m)   Wt 196 lb 6.4 oz (89.1 kg)   LMP 10/22/2014 (Approximate)   SpO2 96%   BMI 35.92 kg/m   Physical Exam   VITALS: BP- 111/70 GEN: No Distress LUNGS: Air entry equal and clear B/L CARDIOVASCULAR: Heart sounds normal, no murmurs. RRR NEUROLOGICAL: Motor strength right lower extremity 4/5, left lower extremity 3/6 MSK: Mild lumbar spine tenderness with ROM. Gait slow and steady Ext: No edema today       ASSESSMENT/PLAN:   Assessment & Plan Chronic kidney disease, unspecified CKD stage Repeat Bmet today   Assessment and Plan    Chronic back pain with lower extremity weakness and spasms Chronic back pain with lower extremity weakness and spasms, occurring weekly. Possible nerve involvement. Differential includes electrolyte imbalance. - Based on hx unclear if worsening - Denies change in BM or bladder habit - Slow but steady ambulation which is baseline - Continue Lyrica  for pain. - Use tizanidine  for spasms. - Continue physical therapy and exercise as tolerated. She is connected with Rehab - Review previous MRI or CT results. - Consider repeat MRI of the back. - Refer to spine specialist if needed. - Post visit call - no recent imaging of her lumbar spine. She endorsed recent metal placement in her body and record indicates allergy to contrast dye. CT lumbar spine without contrast ordered - ED precautions discussed   Congestive heart failure with lower extremity edema  Congestive heart failure with lower extremity edema.  - Seen recently by a different provider and was advised to get back on her Lasix  which is working fine - No acute CHF exacerbation. -Echocardiogram showed normal ejection fraction (60-65%). - Continue Lasix  for edema. - Advise salt intake reduction. - Use compression stockings.  Chronic  neutropenia Oncology suggested Lyrica  dose adjustment due to low white blood cell count. - Although less common cause of neutropenia - Discuss Lyrica  dose adjustment with pain specialist. - Repeat CBC today  Depression Depression with PHQ-9 score of 18, indicating moderate to severe depression. No suicidal ideation. - Place referral to psychiatry. - Provide list of local psychiatry.  Fatigue Fatigue with shortness of breath and back pain. Differential includes medication side effects, anemia, or systemic issues. - Order blood tests for anemia and other causes.   Fungal intertrigo (abdominal/pelvic skin) Fungal intertrigo in abdominal/pelvic area. Nystatin  cream effective. - Refill nystatin  cream prescription.   General Health Maintenance Due for flu shot, hepatitis B vaccine, and Tdap booster. Pneumonia vaccine recently given. - Discussed flu shot and she said she already had this - Administer hepatitis B vaccine.        Otto Fairly, MD [facility]

## 2024-09-05 NOTE — Patient Instructions (Signed)
   [facility] Diagnosis and Treatment of Childhood Mood Disorders, ADHD, Autism, and Developmental Delay  [address], [address] [place],  [place]  [address] Get Driving Directions Main: [phone]  Assessments for ADHD and Therapy for Children  [facility]: [phone] [facility] [address], [place], [place] [address]  [phone]  [phone]  [facility] for Mental Health Disorders  This also provides regular therapy at low cost for children Therapists speak Spanish and English  [address], [place], [place] [address] Monday - Friday: 8:30am-12:00pm / 1:00pm-2:30pm  

## 2024-09-06 ENCOUNTER — Ambulatory Visit: Payer: Self-pay | Admitting: Family Medicine

## 2024-09-06 DIAGNOSIS — N189 Chronic kidney disease, unspecified: Secondary | ICD-10-CM

## 2024-09-06 DIAGNOSIS — N1330 Unspecified hydronephrosis: Secondary | ICD-10-CM

## 2024-09-06 DIAGNOSIS — M4726 Other spondylosis with radiculopathy, lumbar region: Secondary | ICD-10-CM

## 2024-09-06 LAB — CBC
Hematocrit: 36.1 % (ref 34.0–46.6)
Hemoglobin: 11.7 g/dL (ref 11.1–15.9)
MCH: 28.3 pg (ref 26.6–33.0)
MCHC: 32.4 g/dL (ref 31.5–35.7)
MCV: 87 fL (ref 79–97)
Platelets: 252 x10E3/uL (ref 150–450)
RBC: 4.13 x10E6/uL (ref 3.77–5.28)
RDW: 14.1 % (ref 11.7–15.4)
WBC: 2.8 x10E3/uL — ABNORMAL LOW (ref 3.4–10.8)

## 2024-09-06 LAB — BASIC METABOLIC PANEL WITH GFR
BUN/Creatinine Ratio: 11 — ABNORMAL LOW (ref 12–28)
BUN: 23 mg/dL (ref 8–27)
CO2: 22 mmol/L (ref 20–29)
Calcium: 9.9 mg/dL (ref 8.7–10.3)
Chloride: 100 mmol/L (ref 96–106)
Creatinine, Ser: 2.06 mg/dL — ABNORMAL HIGH (ref 0.57–1.00)
Glucose: 61 mg/dL — ABNORMAL LOW (ref 70–99)
Potassium: 3.9 mmol/L (ref 3.5–5.2)
Sodium: 140 mmol/L (ref 134–144)
eGFR: 27 mL/min/1.73 — ABNORMAL LOW (ref >59)

## 2024-09-06 LAB — MAGNESIUM: Magnesium: 2.2 mg/dL (ref 1.6–2.3)

## 2024-09-06 LAB — VITAMIN B12: Vitamin B-12: 791 pg/mL (ref 232–1245)

## 2024-09-06 LAB — TSH RFX ON ABNORMAL TO FREE T4: TSH: 1.19 u[IU]/mL (ref 0.450–4.500)

## 2024-09-06 NOTE — Telephone Encounter (Signed)
 HIPAA compliant callback message left.  When she calls, please advise  Glucose 61 > need to increase calorie intake Creatinine 2.06 which is a jump from 1.39 Likely due to her recent daily use of Lasix  for leg swelling. Hold Lasix , keep well hydrated and repeat lab on 09/08/24 Give ED precautions WBC improved - Lasix  can cause agranulocytosis in chronic use Oncology suggested it is likely due to her Lyrica , although an uncommon s/e of Lyrica .

## 2024-09-08 NOTE — Progress Notes (Signed)
 Discussed the lab result with her. Increase calorie intake for hypoglycemia. She is asymptomatic from that standpoint.  I discussed improved WBC with her and worsening kidney function while on daily Lasix . Continue Losartan . Hold Lasix  for now and keep well hydrated. Lab appointment made for Bmet in 2 days.  Red flag signs of worsening renal function and indications for ED evaluation were discussed with her. She verbalized understanding and agreed with the plan.

## 2024-09-08 NOTE — Addendum Note (Signed)
 Addended by: ANDERS OTTO DASEN on: 09/08/2024 08:53 AM   Modules accepted: Orders

## 2024-09-09 ENCOUNTER — Telehealth: Payer: Self-pay

## 2024-09-09 NOTE — Telephone Encounter (Signed)
-----   Message from [facility] Reena S sent at 09/09/2024  2:10 PM EDT ----- Ok to schedule CT at a [facility] facility.  Thanks! Margit

## 2024-09-09 NOTE — Telephone Encounter (Signed)
 Patient is scheduled for CT on 10/1 @[facility] 

## 2024-09-10 ENCOUNTER — Other Ambulatory Visit: Payer: Self-pay

## 2024-09-12 ENCOUNTER — Other Ambulatory Visit

## 2024-09-12 DIAGNOSIS — N179 Acute kidney failure, unspecified: Secondary | ICD-10-CM

## 2024-09-12 DIAGNOSIS — N189 Chronic kidney disease, unspecified: Secondary | ICD-10-CM | POA: Diagnosis not present

## 2024-09-13 LAB — BASIC METABOLIC PANEL WITH GFR
BUN/Creatinine Ratio: 16 (ref 12–28)
BUN: 20 mg/dL (ref 8–27)
CO2: 23 mmol/L (ref 20–29)
Calcium: 9.2 mg/dL (ref 8.7–10.3)
Chloride: 104 mmol/L (ref 96–106)
Creatinine, Ser: 1.28 mg/dL — ABNORMAL HIGH (ref 0.57–1.00)
Glucose: 92 mg/dL (ref 70–99)
Potassium: 4.1 mmol/L (ref 3.5–5.2)
Sodium: 141 mmol/L (ref 134–144)
eGFR: 47 mL/min/1.73 — ABNORMAL LOW (ref >59)

## 2024-09-15 ENCOUNTER — Encounter: Payer: Self-pay | Admitting: Family Medicine

## 2024-09-15 ENCOUNTER — Ambulatory Visit: Payer: Self-pay | Admitting: Family Medicine

## 2024-09-15 NOTE — Telephone Encounter (Signed)
 HIPAA compliant callback message left.  Note: Renal/kidney test improved from the last check. Continue to hold Lasix  which is likely the cause of her worsening renal function. Keep well hydrated. F/U as planned.

## 2024-09-17 ENCOUNTER — Ambulatory Visit (HOSPITAL_COMMUNITY)
Admission: RE | Admit: 2024-09-17 | Discharge: 2024-09-17 | Disposition: A | Discharge status: Home / Self Care | Source: Ambulatory Visit | Attending: Family Medicine | Admitting: Family Medicine

## 2024-09-17 DIAGNOSIS — M545 Low back pain, unspecified: Secondary | ICD-10-CM | POA: Insufficient documentation

## 2024-09-17 DIAGNOSIS — M47816 Spondylosis without myelopathy or radiculopathy, lumbar region: Secondary | ICD-10-CM | POA: Diagnosis not present

## 2024-09-17 DIAGNOSIS — M5137 Other intervertebral disc degeneration, lumbosacral region with discogenic back pain only: Secondary | ICD-10-CM | POA: Diagnosis not present

## 2024-09-17 DIAGNOSIS — R29898 Other symptoms and signs involving the musculoskeletal system: Secondary | ICD-10-CM | POA: Insufficient documentation

## 2024-09-17 DIAGNOSIS — M5136 Other intervertebral disc degeneration, lumbar region with discogenic back pain only: Secondary | ICD-10-CM | POA: Diagnosis not present

## 2024-09-17 DIAGNOSIS — M4807 Spinal stenosis, lumbosacral region: Secondary | ICD-10-CM | POA: Diagnosis not present

## 2024-09-17 NOTE — Telephone Encounter (Signed)
*  delay in documentation: Patient returned call to nurse line on Monday morning.   Verified name and DOB and provided her with message per Dr. Anders.   Patient is asking about process for receiving allergy testing as she believes she may have allergies to certain foods.   Advised patient that we would need to schedule her a follow up visit for further discussion with PCP.   Scheduled for Monday, 10/6.  Chiquita JAYSON English, RN

## 2024-09-19 NOTE — Addendum Note (Signed)
 Addended by: ANDERS CUMINS T on: 09/19/2024 01:14 PM   Modules accepted: Orders

## 2024-09-19 NOTE — Telephone Encounter (Signed)
 Discussed CT lumbar spine with her, which shows DJD with no nerve compression. However she has B/L hydronephrosis. Given the recent worsening of renal function, we discussed renal ultrasound and continued holding Lasix . No new GU symptoms.  Continue f/u with rehab clinic for pain management. Referral to the orthopedic department.  She has yet to hear back from the psych referral. I contacted our referral specialist, who is working on this now. I gave her the information below about the [facility] walk-in clinic. She can go there without an appointment. She agreed with the plan.    [facility]  Phone [phone] Address [address] [place], [place] [address] Hours Open 24/7. No appointment required.

## 2024-09-21 ENCOUNTER — Telehealth: Payer: Self-pay | Admitting: Family Medicine

## 2024-09-21 NOTE — Telephone Encounter (Signed)
 HIPAA compliant callback message left. Patient inquired about her scheduled appointment last week.  I left a message to remind her of her appointment at [facility] on 09/22/24 at 10:50 am Call to cancel/reschedule if needed.

## 2024-09-22 ENCOUNTER — Encounter: Payer: Self-pay | Admitting: Family Medicine

## 2024-09-22 ENCOUNTER — Ambulatory Visit: Admitting: Family Medicine

## 2024-09-22 NOTE — Assessment & Plan Note (Signed)
 Depression with PHQ-9 score of 18, indicating moderate to severe depression. No suicidal ideation. - Place referral to psychiatry. - Provide list of local psychiatry.

## 2024-09-23 ENCOUNTER — Ambulatory Visit: Admitting: Registered Nurse

## 2024-09-24 ENCOUNTER — Encounter: Admitting: Registered Nurse

## 2024-09-24 ENCOUNTER — Ambulatory Visit (HOSPITAL_COMMUNITY)

## 2024-09-24 ENCOUNTER — Encounter: Attending: Registered Nurse | Admitting: Registered Nurse

## 2024-09-24 ENCOUNTER — Encounter: Payer: Self-pay | Admitting: Registered Nurse

## 2024-09-24 VITALS — BP 110/68 | HR 60 | Ht 62.0 in | Wt 197.0 lb

## 2024-09-24 DIAGNOSIS — M5412 Radiculopathy, cervical region: Secondary | ICD-10-CM | POA: Insufficient documentation

## 2024-09-24 DIAGNOSIS — Z5181 Encounter for therapeutic drug level monitoring: Secondary | ICD-10-CM | POA: Insufficient documentation

## 2024-09-24 DIAGNOSIS — G8929 Other chronic pain: Secondary | ICD-10-CM | POA: Insufficient documentation

## 2024-09-24 DIAGNOSIS — G4709 Other insomnia: Secondary | ICD-10-CM | POA: Diagnosis not present

## 2024-09-24 DIAGNOSIS — G894 Chronic pain syndrome: Secondary | ICD-10-CM | POA: Diagnosis not present

## 2024-09-24 DIAGNOSIS — R001 Bradycardia, unspecified: Secondary | ICD-10-CM | POA: Diagnosis not present

## 2024-09-24 DIAGNOSIS — M5416 Radiculopathy, lumbar region: Secondary | ICD-10-CM | POA: Insufficient documentation

## 2024-09-24 DIAGNOSIS — R202 Paresthesia of skin: Secondary | ICD-10-CM | POA: Diagnosis not present

## 2024-09-24 DIAGNOSIS — M542 Cervicalgia: Secondary | ICD-10-CM | POA: Diagnosis not present

## 2024-09-24 DIAGNOSIS — Z79891 Long term (current) use of opiate analgesic: Secondary | ICD-10-CM | POA: Insufficient documentation

## 2024-09-24 DIAGNOSIS — M546 Pain in thoracic spine: Secondary | ICD-10-CM | POA: Insufficient documentation

## 2024-09-24 DIAGNOSIS — M7918 Myalgia, other site: Secondary | ICD-10-CM | POA: Insufficient documentation

## 2024-09-24 MED ORDER — HYDROCODONE-ACETAMINOPHEN 7.5-325 MG PO TABS: ORAL_TABLET | ORAL | 0 refills | Status: DC
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
## 2024-09-25 ENCOUNTER — Other Ambulatory Visit: Payer: Self-pay | Admitting: Family Medicine

## 2024-10-01 ENCOUNTER — Ambulatory Visit (HOSPITAL_COMMUNITY)

## 2024-10-06 ENCOUNTER — Telehealth: Payer: Self-pay

## 2024-10-06 ENCOUNTER — Ambulatory Visit

## 2024-10-06 NOTE — Telephone Encounter (Signed)
 Patient calls nurse line regarding concerns with BLE edema and intermittent episodes of shortness of breath.   She states that this has been going on for over one month. She states that she will be up and doing something and will feel that she is having a hard time catching her breath.    She reports history of asthma. She has albuterol  breathing treatments and inhaler at home.   Patient is not currently experiencing shortness of breath, chest pain and is able to speak in complete sentences.   We discussed reasons to go to the Emergency department. Patient voices understanding of precautions.   Offered to schedule with PCP tomorrow. Patient states that the earliest she can come in will be on Friday. Reiterated ED precautions.   Chiquita JAYSON English, RN

## 2024-10-10 ENCOUNTER — Ambulatory Visit: Admitting: Student

## 2024-10-10 ENCOUNTER — Ambulatory Visit

## 2024-10-13 ENCOUNTER — Encounter: Payer: Self-pay | Admitting: Family Medicine

## 2024-10-13 ENCOUNTER — Ambulatory Visit

## 2024-10-13 ENCOUNTER — Ambulatory Visit: Admitting: Family Medicine

## 2024-10-13 VITALS — BP 111/68 | HR 60 | Ht 62.0 in | Wt 198.6 lb

## 2024-10-13 DIAGNOSIS — L293 Anogenital pruritus, unspecified: Secondary | ICD-10-CM

## 2024-10-13 DIAGNOSIS — Z0000 Encounter for general adult medical examination without abnormal findings: Secondary | ICD-10-CM

## 2024-10-13 DIAGNOSIS — I872 Venous insufficiency (chronic) (peripheral): Secondary | ICD-10-CM

## 2024-10-13 DIAGNOSIS — R252 Cramp and spasm: Secondary | ICD-10-CM

## 2024-10-13 MED ORDER — NYSTATIN 100000 UNIT/GM EX OINT: 1.0000 | TOPICAL_OINTMENT | Freq: Two times a day (BID) | CUTANEOUS | 0 refills | Status: AC | PRN

## 2024-10-13 NOTE — Patient Instructions (Signed)
 It was great to see you!  Our plans for today:  - Continue your compression stockings and elevating your legs when you are sitting.   Take care and seek immediate care sooner if you develop any concerns.   Dr. Madelon   Chronic Venous Insufficiency Chronic venous insufficiency is a condition that causes the veins in the legs to struggle to pump blood from the legs to the heart. It is also called venous stasis. This condition can happen when the vein walls are stretched, weakened, or damaged. It can also happen when the valves inside the vein are damaged. With the right treatment, you should be able to still lead an active life. What are the causes? Common causes of this condition include: Venous hypertension. This is high blood pressure inside the veins. Sitting or standing too long. This can cause increased blood pressure in the veins of the leg. Deep vein thrombosis (DVT). This is a blood clot that blocks blood flow in a vein. Phlebitis. This is inflammation of a vein. It can cause a blood clot to form. An abnormal growth of cells (tumor) in the area between your hip bones (pelvis). This can cause blood to back up. What increases the risk? Factors that may make you more likely to get this condition include: Having a family history of the condition. Being overweight. Being pregnant. Not getting enough exercise. Smoking. Having a job that requires you to sit or stand in one place for a long time. Being a certain age. Females in their 27s and 71s and males in their 18s are more likely to get this condition. What are the signs or symptoms? Symptoms of this condition include: Varicose veins. These are veins that are enlarged, bulging, or twisted. Skin breakdown or ulcers. Reddened skin or dark discoloration of the skin on the leg between the knee and ankle. Lipodermatosclerosis. This is brown, smooth, tight, and painful skin just above the ankle. It is often on the inside of the  leg. Swelling of the legs. How is this diagnosed? This condition may be diagnosed based on your medical history and a physical exam. You may also need tests, such as: A duplex ultrasound. This shows how blood flows through a blood vessel. Plethysmography. This tests blood flow. Venogram. This looks at the veins using an X-ray and dye. How is this treated? The goals of treatment are to help you return to an active life and to relieve pain. Treatment may include: Wearing compression stockings. These do not cure the condition but can help relieve symptoms. They can also help stop your condition from getting worse. Sclerotherapy. This involves injecting a solution to shrink damaged veins. Surgery. This may include: Vein stripping. This is when a diseased vein is taken out. Laser ablation surgery. This is when blood flow is cut off through the vein. Repairing or remaking a valve inside the affected vein. Follow these instructions at home: Lifestyle Do not use any products that contain nicotine or tobacco. These products include cigarettes, chewing tobacco, and vaping devices, such as e-cigarettes. If you need help quitting, ask your health care provider. Stay active. Exercise, walk, or do other activities. Ask your provider what activities are safe for you. General instructions Take over-the-counter and prescription medicines only as told by your provider. Drink enough fluid to keep your pee (urine) pale yellow. Wear compression stockings as told by your provider. These stockings help to prevent blood clots and reduce swelling in your legs. Keep all follow-up visits. Your provider  will check your legs for any changes and adjust your treatment plan as needed. Contact a health care provider if: You have redness, swelling, or more pain in the affected area. You see a red streak or line that goes up or down from the area. You have skin breakdown or skin loss. You get an injury in the affected  area. You get a fever. This information is not intended to replace advice given to you by your health care provider. Make sure you discuss any questions you have with your health care provider. Document Revised: 12/19/2022 Document Reviewed: 12/19/2022 Elsevier Patient Education  2024 [employer].

## 2024-10-13 NOTE — Progress Notes (Unsigned)
   SUBJECTIVE:   CHIEF COMPLAINT / HPI:   Discussed the use of AI scribe software for clinical note transcription with the patient, who gave verbal consent to proceed.  History of Present Illness Maria Griffith is a 63 year old female with a history of open heart surgery who presents with leg swelling.  Lower extremity edema and pain - Bilateral leg swelling, predominantly in the left leg, worsening over the past month - Swelling is more pronounced by the afternoon and with prolonged standing - Compression stockings provide partial relief - Leg elevation helps manage swelling - Stabbing pain in the legs with excessive walking, primarily on the bottom of the feet - Pain causes her to walk on her toes or sit down to relieve symptoms  Cardiac history and associated symptoms - History of open heart surgery in June 2023 for dilated aortic root and large valve - Frequent fatigue - Recent heart ultrasound performed - Dizziness, especially after taking blood pressure medication on an empty stomach - No nausea - Dizziness and loss of balance when walking  Respiratory symptoms - Asthma - Occasional shortness of breath, particularly during activities such as washing herself  Neurological and musculoskeletal symptoms - History of spinal surgery with resultant nerve damage affecting balance - Requires use of a cane for ambulation - Muscle spasms, particularly when stretching, which are painful and prolonged - Heat application provides some relief for muscle spasms - Tingling in fingertips associated with previous cervical spine issues  Genitourinary symptoms - Uses nystatin  cream as needed for genital itching - No current rash - Persistent itching in the genital area  Immunization status - Received two doses of hepatitis B vaccine to date   H/oi HFpEF, HTN, CKD3b Lasix  40mg  prn Last ECHO 08/2024 LVEF 60-65%, mild LVH, normal diastolic dysfunction  OBJECTIVE:   LMP 10/22/2014  (Approximate)   Gen: well appearing, in NAD Card: RRR Lungs: CTAB Ext: WWP, no edema ***  ASSESSMENT/PLAN:   No problem-specific Assessment & Plan notes found for this encounter.     Assessment and Plan Assessment & Plan Chronic venous insufficiency with bilateral lower extremity edema Bilateral lower extremity edema, more pronounced in the left leg, likely due to venous insufficiency. Heart and kidney functions stable. - Continue wearing compression stockings, especially when standing or moving for long periods. - Elevate legs at the end of the day to reduce swelling.  History of aortic root dilation and repair (open heart surgery) Aortic root dilation repaired in June 2023. Heart function remains good.  Asthma Asthma.  Pruritus and history of intertrigo/yeast infection in groin Intermittent pruritus in the groin area, previously treated with nystatin  cream. Occasional itching persists. - Refill nystatin  cream for use as needed for itching.  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 Tingling in fingertips possibly related to cervical spine issues or carpal tunnel syndrome.  Muscle spasms Intermittent muscle spasms, particularly when stretching. Heat application provides some relief. - Recommend over-the-counter B complex vitamin to help with muscle spasms.  General Health Maintenance Due for the third dose of hepatitis B vaccine in one month. Discussed the importance of the shingles vaccine for prevention of outbreaks and postherpetic neuralgia. - Administer third dose of hepatitis B vaccine in one month. - Obtain shingles vaccine at a pharmacy.        Donald CHRISTELLA Lai, DO

## 2024-10-14 DIAGNOSIS — I872 Venous insufficiency (chronic) (peripheral): Secondary | ICD-10-CM | POA: Insufficient documentation

## 2024-10-14 NOTE — Assessment & Plan Note (Addendum)
 Longstanding history, on muscle relaxant and follows with pain management. No alarming features on exam today. Not on statin. Recommend OTC B complex vitamin, stretching, remaining active as able.

## 2024-10-14 NOTE — Assessment & Plan Note (Addendum)
 Bilateral, trace edema today. Reviewed recent labs and imaging, heart and renal function stable.  - Continue wearing compression stockings, especially when standing or moving for long periods. - Elevate legs at the end of the day to reduce swelling.

## 2024-10-16 ENCOUNTER — Encounter

## 2024-10-17 NOTE — Progress Notes (Unsigned)
 Cardiology Office Note   Date:  10/29/2024  ID:  Maria Griffith, DOB May 14, 1961, MRN [id number] PCP: Anders Otto DASEN, MD  [facility] Providers Cardiologist:  Lonni Cash, MD     History of Present Illness Maria Griffith is a 63 y.o. female with a past medical history of hypertension, ascending aortic aneurysm s/p repair 2023, HFpEF, venous insufficiency, aortic valve insufficiency, asthma, CKD, dyslipidemia, chronic pain.  08/20/2024 echo EF 60 to 65%, mild concentric LVH, LA mildly dilated, mild to moderate aortic regurgitation 08/29/2022 echo EF 60 to 65%, mild LVH, trivial MR, mild to moderate aortic regurgitation 06/07/2022 replacement of ascending aorta and resuspension of aortic valve 06/05/2022 carotid duplex minimal plaque but near normal 01/09/2022 coronary CT calcium  score 0, soft plaque in the first diagonal and RCA which was nonobstructive  She initially established care with Dr. Cash in 2013 for dizziness and syncope, she underwent a stress evaluation, echo, coronary CTA which were all normal.  2023 she had a coronary CTA revealing mild disease in her right coronary artery and first diagonal artery however calcium  score was 0, she then underwent  replacement of the ascending aorta and recovered well from that.  Most recently evaluated by Artist Pouch, PA on 11/12/2023, repeat echocardiogram was arranged which was overall good but mild to moderate aortic regurgitation.  She presents today for follow-up.  She has been doing relatively well from a cardiac perspective since she was last evaluated in our office.  She continues to deal with chronic pain.  She is bothered by fatigue and low energy.  She continues to work but is relatively sedentary.  She is bothered by pedal edema at times however has been wearing compression socks and elevating when she can and this has helped somewhat.  She has atypical episodes of chest pain, typically relieved with a hot compress.   She denies chest pain, palpitations, dyspnea, pnd, orthopnea, n, v, dizziness, syncope, weight gain, or early satiety.    ROS: Review of Systems  Constitutional:  Positive for malaise/fatigue.  Cardiovascular:  Positive for leg swelling.  All other systems reviewed and are negative.    Studies Reviewed EKG Interpretation Date/Time:  Wednesday October 29 2024 11:01:42 EST Ventricular Rate:  55 PR Interval:  142 QRS Duration:  82 QT Interval:  418 QTC Calculation: 399 R Axis:   -19  Text Interpretation: Sinus bradycardia Minimal voltage criteria for LVH, may be normal variant ( R in aVL ) Septal infarct (cited on or before 29-Oct-2024) When compared with ECG of 12-Nov-2023 13:36, No significant change was found Confirmed by Carlin Nest [phone]) on 10/29/2024 11:04:57 AM    Cardiac Studies & Procedures   ______________________________________________________________________________________________     ECHOCARDIOGRAM  ECHOCARDIOGRAM COMPLETE 08/20/2024  Narrative ECHOCARDIOGRAM REPORT    Patient Name:   Maria Griffith Date of Exam: 08/20/2024 Medical Rec #:  [id number]        Height:       62.0 in Accession #:    [id number]       Weight:       198.8 lb Date of Birth:  02-Jan-1961         BSA:          1.907 m Patient Age:    63 years         BP:           98/63 mmHg Patient Gender: F  HR:           55 bpm. Exam Location:  Outpatient  Procedure: 2D Echo, Cardiac Doppler and Color Doppler (Both Spectral and Color Flow Doppler were utilized during procedure).  Indications:    Dyspnea R06.00  History:        Patient has prior history of Echocardiogram examinations, most recent 08/29/2022. CHF, CKD, stage 3b; Risk Factors:Hypertension.  Sonographer:    Thea Norlander RCS Referring Phys: [id number] DONALD HERO RUMBALL  IMPRESSIONS   1. Left ventricular ejection fraction, by estimation, is 60 to 65%. The left ventricle has normal function. The left ventricle has  no regional wall motion abnormalities. There is mild concentric left ventricular hypertrophy. Left ventricular diastolic parameters were normal. 2. Right ventricular systolic function is normal. The right ventricular size is normal. There is normal pulmonary artery systolic pressure. The estimated right ventricular systolic pressure is 22.4 mmHg. 3. Left atrial size was mildly dilated. 4. The mitral valve is normal in structure. No evidence of mitral valve regurgitation. No evidence of mitral stenosis. 5. The aortic valve is normal in structure. Aortic valve regurgitation is mild to moderate. No aortic stenosis is present. 6. Aortic root/ascending aorta has been repaired/replaced. 7. The inferior vena cava is dilated in size with >50% respiratory variability, suggesting right atrial pressure of 8 mmHg.  Comparison(s): No significant change from prior study. Prior images reviewed side by side.  FINDINGS Left Ventricle: Left ventricular ejection fraction, by estimation, is 60 to 65%. The left ventricle has normal function. The left ventricle has no regional wall motion abnormalities. The left ventricular internal cavity size was normal in size. There is mild concentric left ventricular hypertrophy. Left ventricular diastolic parameters were normal.  Right Ventricle: The right ventricular size is normal. No increase in right ventricular wall thickness. Right ventricular systolic function is normal. There is normal pulmonary artery systolic pressure. The tricuspid regurgitant velocity is 1.90 m/s, and with an assumed right atrial pressure of 8 mmHg, the estimated right ventricular systolic pressure is 22.4 mmHg.  Left Atrium: Left atrial size was mildly dilated.  Right Atrium: Right atrial size was normal in size.  Pericardium: Trivial pericardial effusion is present.  Mitral Valve: The mitral valve is normal in structure. No evidence of mitral valve regurgitation. No evidence of mitral valve  stenosis.  Tricuspid Valve: The tricuspid valve is normal in structure. Tricuspid valve regurgitation is trivial.  Aortic Valve: The aortic valve is normal in structure. Aortic valve regurgitation is mild to moderate. No aortic stenosis is present. Aortic valve peak gradient measures 6.0 mmHg.  Pulmonic Valve: The pulmonic valve was grossly normal. Pulmonic valve regurgitation is trivial. No evidence of pulmonic stenosis.  Aorta: The aortic root is normal in size and structure and the aortic root/ascending aorta has been repaired/replaced.  Venous: The inferior vena cava is dilated in size with greater than 50% respiratory variability, suggesting right atrial pressure of 8 mmHg.  IAS/Shunts: No atrial level shunt detected by color flow Doppler.   LEFT VENTRICLE PLAX 2D LVIDd:         4.00 cm   Diastology LVIDs:         2.50 cm   LV e' medial:    7.62 cm/s LV PW:         1.40 cm   LV E/e' medial:  9.9 LV IVS:        1.30 cm   LV e' lateral:   12.50 cm/s LVOT diam:  2.40 cm   LV E/e' lateral: 6.0 LV SV:         81 LV SV Index:   43 LVOT Area:     4.52 cm   RIGHT VENTRICLE            IVC RV S prime:     9.68 cm/s  IVC diam: 2.10 cm TAPSE (M-mode): 1.8 cm  LEFT ATRIUM             Index        RIGHT ATRIUM           Index LA diam:        2.90 cm 1.52 cm/m   RA Area:     17.60 cm LA Vol (A2C):   54.5 ml 28.58 ml/m  RA Volume:   40.80 ml  21.40 ml/m LA Vol (A4C):   56.6 ml 29.68 ml/m LA Biplane Vol: 58.5 ml 30.68 ml/m AORTIC VALVE AV Area (Vmax): 3.26 cm AV Vmax:        122.00 cm/s AV Peak Grad:   6.0 mmHg LVOT Vmax:      88.00 cm/s LVOT Vmean:     57.100 cm/s LVOT VTI:       0.180 m  AORTA Ao Root diam: 3.70 cm Ao Asc diam:  3.20 cm  MITRAL VALVE               TRICUSPID VALVE MV Area (PHT): 3.99 cm    TR Peak grad:   14.4 mmHg MV Decel Time: 190 msec    TR Vmax:        190.00 cm/s MV E velocity: 75.50 cm/s MV A velocity: 59.20 cm/s  SHUNTS MV E/A ratio:   1.28        Systemic VTI:  0.18 m Systemic Diam: 2.40 cm  Jerel Croitoru MD Electronically signed by Jerel Balding MD Signature Date/Time: 08/20/2024/4:24:11 PM    Final   TEE  ECHO INTRAOPERATIVE TEE 06/07/2022  Narrative *INTRAOPERATIVE TRANSESOPHAGEAL REPORT *    Patient Name:   Maria Griffith Date of Exam: 06/07/2022 Medical Rec #:  [id number]        Height:       61.0 in Accession #:    [id number]       Weight:       187.0 lb Date of Birth:  07-Jul-1961         BSA:          1.84 m Patient Age:    60 years         BP:           177/92 mmHg Patient Gender: F                HR:           68 bpm. Exam Location:  Anesthesiology  Transesophogeal exam was perform intraoperatively during surgical procedure. Patient was closely monitored under general anesthesia during the entirety of examination.  Indications:     I71.2 Ascending aortic aneurysm Sonographer:     Damien Senior RDCS Performing Phys: [id number] STEVEN C HENDRICKSON Diagnosing Phys: Cordella Stoltzfus  Complications: No known complications during this procedure. POST-OP IMPRESSIONS _ Left Ventricle: The left ventricle is unchanged from pre-bypass. _ Right Ventricle: The right ventricle appears unchanged from pre-bypass. _ Aorta: A graft was placed in the ascending aorta for repair.Hemashield 30mm graft implant. _ Left Atrial Appendage: The left atrial appendage appears unchanged from pre-bypass. _ Aortic Valve: There is  mild regurgitation. _ Mitral Valve: The mitral valve appears unchanged from pre-bypass. _ Tricuspid Valve: The tricuspid valve appears unchanged from pre-bypass. _ Pulmonic Valve: The pulmonic valve appears unchanged from pre-bypass. _ Interatrial Septum: The interatrial septum appears unchanged from pre-bypass. _ Pericardium: The pericardium appears unchanged from pre-bypass. _ Comments: S/P patch repair of SOV aneurysm and ascending aorta aneurysm repair with Hemashield 30mm tube graft. Residual  mild/moderate central AI post repair. No new or worsening wall motion or valvular abnormalities.  PRE-OP FINDINGS Left Ventricle: The left ventricle has normal systolic function, with an ejection fraction of 60-65%. The cavity size was normal. There is moderate concentric left ventricular hypertrophy. No evidence of left ventricular regional wall motion abnormalities. There is moderate concentric left ventricular hypertrophy. Left ventricular diastolic parameters were normal.   Right Ventricle: The right ventricle has normal systolic function. The cavity was normal. There is no increase in right ventricular wall thickness. Right ventricular systolic pressure is normal.  Left Atrium: Left atrial size was normal in size. No left atrial/left atrial appendage thrombus was detected. Left atrial appendage velocity is reduced at less than 40 cm/s.  Right Atrium: Right atrial size was normal in size.  Interatrial Septum: No atrial level shunt detected by color flow Doppler. There is no evidence of a patent foramen ovale.  Pericardium: There is no evidence of pericardial effusion. There is no pleural effusion.  Mitral Valve: The mitral valve is normal in structure. Mitral valve regurgitation is trivial by color flow Doppler. There is no evidence of mitral valve vegetation. There is No evidence of mitral stenosis.  Tricuspid Valve: The tricuspid valve was normal in structure. Tricuspid valve regurgitation is trivial by color flow Doppler. No evidence of tricuspid stenosis is present. There is no evidence of tricuspid valve vegetation.  Aortic Valve: The aortic valve is tricuspid Aortic valve regurgitation is moderate by color flow Doppler. The jet is centrally-directed. There is no stenosis of the aortic valve. There is no evidence of aortic valve vegetation. AI PHT , AI EROA via PISA .1cm2 (moderate), RVol 25cc (mild). Diastolic reversal in descending thoracic aortia.  Pulmonic Valve: The  pulmonic valve was normal in structure, with normal. Pulmonic valve regurgitation is trivial by color flow Doppler.   Aorta: The descending aorta is normal in size and structure. There is an aneurysm involving the aortic root measuring 46 mm. There is an aneurysm involving the ascending aorta measuring 43 mm.  Pulmonary Artery: Norva Purl catheter present on the right.  Venous: The inferior vena cava is normal in size with greater than 50% respiratory variability, suggesting right atrial pressure of 3 mmHg.  Shunts: There is no evidence of an atrial septal defect.  +--------------+--------++ LEFT VENTRICLE          +----------------+---------++ +--------------+--------++  Diastology                PLAX 2D                 +----------------+---------++ +--------------+--------++  LV e' lateral:  6.28 cm/s LVIDd:        5.60 cm   +----------------+---------++ +--------------+--------++  LV E/e' lateral:8.9       LVIDs:        3.80 cm   +----------------+---------++ +--------------+--------++  LV e' medial:   5.66 cm/s LV PW:        1.50 cm   +----------------+---------++ +--------------+--------++  LV E/e' medial: 9.9       LV IVS:  1.30 cm   +----------------+---------++ +--------------+--------++ LVOT diam:    2.40 cm  +--------------+--------++ LV SV:        92 ml    +--------------+--------++ LV SV Index:  47.01    +--------------+--------++ LVOT Area:    4.52 cm +--------------+--------++                        +--------------+--------++  +---------------+------+-------+ RIGHT VENTRICLE              +---------------+------+-------+ TAPSE (M-mode):2.4 cm2.37 cm +---------------+------+-------+  +-------------+-----------++ AORTIC VALVE             +-------------+-----------++ AV Vmax:     124.00 cm/s +-------------+-----------++ AV Vmean:    74.900  cm/s +-------------+-----------++ AV VTI:      0.268 m     +-------------+-----------++ AV Peak Grad:6.2 mmHg    +-------------+-----------++  +--------------+-------++ AORTA                 +--------------+-------++ Ao Sinus diam:4.60 cm +--------------+-------++ Ao STJ diam:  4.3 cm  +--------------+-------++ Ao Asc diam:  4.40 cm +--------------+-------++  +--------------+----------++ MITRAL VALVE              +--------------+-------+ +--------------+----------++  SHUNTS                MV Area (PHT):4.41 cm    +--------------+-------+ +--------------+----------++  Systemic Diam:2.40 cm MV Peak grad: 1.2 mmHg    +--------------+-------+ +--------------+----------++ MV Mean grad: 0.0 mmHg   +--------------+----------++ MV Vmax:      0.54 m/s   +--------------+----------++ MV Vmean:     27.9 cm/s  +--------------+----------++ MV PHT:       49.88 msec +--------------+----------++ MV Decel Time:172 msec   +--------------+----------++ +--------------+----------++ MV E velocity:56.00 cm/s +--------------+----------++ MV A velocity:37.50 cm/s +--------------+----------++ MV E/A ratio: 1.49       +--------------+----------++   Cordella Fix Electronically signed by Cordella Fix Signature Date/Time: 06/09/2022/3:31:37 PM    Final    CT SCANS  CT CORONARY MORPH W/CTA COR W/SCORE 01/09/2022  Addendum 01/09/2022  3:32 PM ADDENDUM REPORT: 01/09/2022 15:29  CLINICAL DATA:  Chest pain  EXAM: Cardiac/Coronary CTA  TECHNIQUE: A non-contrast, gated CT scan was obtained with axial slices of 3 mm through the heart for calcium  scoring. Calcium  scoring was performed using the Agatston method. A 100 kV prospective, gated, contrast cardiac scan was obtained. Gantry rotation speed was 250 msecs and collimation was 0.6 mm. Two sublingual nitroglycerin  tablets (0.8 mg) were given. The 3D  data set was reconstructed in 5% intervals of the 35-75% of the R-R cycle. Diastolic phases were analyzed on a dedicated workstation using MPR, MIP, and VRT modes. The patient received 95 cc of contrast.  FINDINGS: Image quality: Excellent.  Noise artifact is: Limited.  Coronary Arteries:  Normal coronary origin.  Right dominance.  Left main: The left main is a large caliber vessel with a normal take off from the left coronary cusp that bifurcates to form a left anterior descending artery and a left circumflex artery. There is no plaque or stenosis.  Left anterior descending artery: The LAD is patent without evidence of plaque or stenosis. There is a mid LAD myocardial bridge (normal variant). The LAD gives off 1 large diagonal branch with minimal non-calcified plaque (<25%).  Left circumflex artery: The LCX is non-dominant and patent with no evidence of plaque or stenosis. The LCX gives off 2 patent obtuse marginal branches.  Right coronary artery: The RCA is dominant with normal  take off from the right coronary cusp. There is minimal non-calcified plaque (<25%). The RCA terminates as a PDA and right posterolateral branch without evidence of plaque or stenosis.  Right Atrium: Right atrial size is within normal limits.  Right Ventricle: The right ventricular cavity is within normal limits.  Left Atrium: Left atrial size is normal in size with no left atrial appendage filling defect.  Left Ventricle: The ventricular cavity size is within normal limits. There are no stigmata of prior infarction. There is no abnormal filling defect.  Pulmonary arteries: Normal in size without proximal filling defect.  Pulmonary veins: Normal pulmonary venous drainage.  Pericardium: Normal thickness with no significant effusion or calcium  present.  Cardiac valves: The aortic valve is trileaflet without significant calcification. There is incomplete leaflet coaptation consistent with at  least moderate AI. The mitral valve is normal structure without significant calcification.  Aorta: Aneurysm of the ascending aorta up to 43 mm. Aneurysm of the sinus of Valsalva with elongation of the NCC up to 50 mm (NCC/LCC cusp to cusp measurement)d.  Extra-cardiac findings: See attached radiology report for non-cardiac structures.  IMPRESSION: 1. Coronary calcium  score of 0.  2. Normal coronary origin with right dominance.  3. Minimal non-calcified plaque in the first diagonal and RCA (<25%).  4. Mid LAD myocardial bridge (normal variant).  5. Sinus of Valsalva aneurysm up to 50 mm (cusp to cusp NCC-LCC, double oblique measurement). Similar in dimensions to recent cardiac MRI.  RECOMMENDATIONS: 1. Minimal non-obstructive CAD (0-24%). Consider non-atherosclerotic causes of chest pain. Consider preventive therapy and risk factor modification.  Darryle Decent, MD   Electronically Signed By: Darryle Decent M.D. On: 01/09/2022 15:29  Narrative EXAM: OVER-READ INTERPRETATION  CT CHEST  The following report is an over-read performed by radiologist Dr. Toribio Aye of [facility] on 01/09/2022. This over-read does not include interpretation of cardiac or coronary anatomy or pathology. The coronary calcium  score/coronary CTA interpretation by the cardiologist is attached.  COMPARISON:  Cardiac CT 10/18/2012.  FINDINGS: Aortic atherosclerosis with ectasia of the ascending thoracic aorta (4.2 cm in diameter). Mild subsegmental atelectasis and/or scarring in the medial segment of the right middle lobe and inferior segment of the lingula. Within the visualized portions of the thorax there are no suspicious appearing pulmonary nodules or masses, there is no acute consolidative airspace disease, no pleural effusions, no pneumothorax and no lymphadenopathy. Visualized portions of the upper abdomen are unremarkable. There are no aggressive appearing lytic or  blastic lesions noted in the visualized portions of the skeleton.  IMPRESSION: 1.  Aortic Atherosclerosis (ICD10-I70.0). 2. Ectasia of ascending thoracic aorta (4.2 cm in diameter). Recommend annual imaging followup by CTA or MRA. This recommendation follows 2010 ACCF/AHA/AATS/ACR/ASA/SCA/SCAI/SIR/STS/SVM Guidelines for the Diagnosis and Management of Patients with Thoracic Aortic Disease. Circulation. 2010; 121: [id number]. Aortic aneurysm NOS (ICD10-I71.9).  Electronically Signed: By: Toribio Aye M.D. On: 01/09/2022 11:14   CARDIAC MRI  MR CARDIAC MORPHOLOGY W WO CONTRAST 11/16/2021  Narrative CLINICAL DATA:  Clinical question of aortic regurgitation and aortic dilation Study assumes HCT of 34 and BSA of 1.95 m2.  EXAM: CARDIAC MRI  TECHNIQUE: The patient was scanned on a 1.5 Tesla GE magnet. A dedicated cardiac coil was used. Functional imaging was done using Fiesta sequences. 2,3, and 4 chamber views were done to assess for RWMA's. Modified Simpson's rule using a short axis stack was used to calculate an ejection fraction on a dedicated work [employer]. The patient received 10  cc of Gadavist . After 10 minutes inversion recovery sequences were used to assess for infiltration and scar tissue.  CONTRAST:  10 cc  of Gadavist   FINDINGS: 1. Moderately increased left ventricular size, with LVEDD 65 mm, and LVEDVi 116 mL/m2.  Moderate concentric hypertrophy with intraventricular septal thickness of 13 mm, posterior wall thickness of 9 mm, and myocardial mass index of 76 g/m2.  Normal left ventricular systolic function (LVEF = 55%). There are no regional wall motion abnormalities.  Left ventricular parametric mapping notable for normal ECV and T2 signal.  There is no late gadolinium enhancement in the left ventricular myocardium.  2. Normal right ventricular size with RVEDVI 84 mL/m2.  Normal right ventricular thickness.  Normal right  ventricular systolic function (RVEF =50 %). There are no regional wall motion abnormalities or aneurysms.  3.  Normal left and right atrial size.  4.  There is a prominent ligamentum arteriosum.  Sinus of Valsalva There is severe dilation.  Left to non cusp - 48 mm  Right to non cusp- 50 mm  Left to right cusp- 48 mm  Sinotubular junction: 48 mm  Ascending aorta: 46 mm- moderate dilation.  Aortic Arch: 24 mm  Descending Aorta: 24 mm  Pulmonary Artery: 31 mm- moderate dilation.  5. Valve assessment:  Aortic Valve:Tri-leaflet aortic valve. No significant stenosis by gradient. Regurgitant fraction 22%. Moderate central regurgitation.  Pulmonic Valve:Qualitatively no significant regurgitation.  Tricuspid Valve:Qualitatively no significant regurgitation.  Mitral Valve: Qualitatively mild mitral regurgitation.  6.  Normal pericardium.  No pericardial effusion.  7. Grossly, no extracardiac findings. Recommended dedicated study if concerned for non-cardiac pathology.  IMPRESSION: There is severe aortic dilation at the level of the sinus of Valsalva.  There is moderate aortic regurgitation by flow quantification.  There is moderate increase in left ventricular size.  Cardiac CT surgery may be considered if clinically indicated.  Stanly Leavens MD   Electronically Signed By: Stanly Leavens M.D. On: 11/17/2021 19:09   ______________________________________________________________________________________________      Risk Assessment/Calculations           Physical Exam VS:  BP 100/66   Pulse (!) 55   Ht 5' 2 (1.575 m)   Wt 196 lb (88.9 kg)   LMP 10/22/2014 (Approximate)   SpO2 97%   BMI 35.85 kg/m        Wt Readings from Last 3 Encounters:  10/29/24 196 lb (88.9 kg)  10/22/24 195 lb 3.2 oz (88.5 kg)  10/13/24 198 lb 9.6 oz (90.1 kg)    GEN: Well nourished, well developed in no acute distress NECK: No JVD; No carotid bruits CARDIAC:  RRR, no murmurs, rubs, gallops RESPIRATORY:  Clear to auscultation without rales, wheezing or rhonchi  ABDOMEN: Soft, non-tender, non-distended EXTREMITIES:  non-pitting edema; No deformity   ASSESSMENT AND PLAN HFpEF -NYHA class II for shortness of breath but I do not think it is related to her heart failure, euvolemic.  Most recent echo in September of this year revealed EF 60 to 65%, mild concentric LVH, LA mildly dilated, mild to moderate aortic regurgitation. Some pedal edema noted I think this is likely secondary to her calcium  channel blocker but I will check a proBNP for completeness.  Continue losartan  50 mg twice daily, metoprolol  25 mg twice daily.  S/p aortic root replacement-most recent echo revealed normal diameter.  Mild to moderate aortic regurgitation-asymptomatic.  Hypertension -blood pressure is low normal for her today at 100/66.  She is currently on amlodipine  10  mg daily, losartan  50 mg twice daily, metoprolol  25 mg twice daily.  I think this is the source of her fatigue.  I would have her keep a blood pressure log at home before we make any changes.  If it continues to be low then I will decrease her amlodipine  to 5 mg daily and this will likely help with her pedal edema as well.  Dyslipidemia - most recent LDL on file for review is 75, we will repeat c-Met and direct LDL today.  Chronic pain-currently on Lyrica , Vistaril , tenacity in--these are likely all contributing to her fatigue.       Dispo: CMET, direct LDL, proBNP, blood pressure log--if her blood pressure remains marginally low plan to decrease her amlodipine  to 5 mg daily.  Otherwise follow-up in 1 year.  Signed, Delon JAYSON Hoover, NP

## 2024-10-22 ENCOUNTER — Encounter: Attending: Registered Nurse | Admitting: Registered Nurse

## 2024-10-22 ENCOUNTER — Other Ambulatory Visit: Payer: Self-pay | Admitting: Cardiology

## 2024-10-22 ENCOUNTER — Encounter: Payer: Self-pay | Admitting: Registered Nurse

## 2024-10-22 VITALS — BP 138/79 | HR 68 | Ht 62.0 in | Wt 195.2 lb

## 2024-10-22 DIAGNOSIS — R202 Paresthesia of skin: Secondary | ICD-10-CM | POA: Insufficient documentation

## 2024-10-22 DIAGNOSIS — M5412 Radiculopathy, cervical region: Secondary | ICD-10-CM | POA: Insufficient documentation

## 2024-10-22 DIAGNOSIS — G4709 Other insomnia: Secondary | ICD-10-CM | POA: Diagnosis present

## 2024-10-22 DIAGNOSIS — G8929 Other chronic pain: Secondary | ICD-10-CM | POA: Diagnosis present

## 2024-10-22 DIAGNOSIS — Z5181 Encounter for therapeutic drug level monitoring: Secondary | ICD-10-CM | POA: Diagnosis present

## 2024-10-22 DIAGNOSIS — M5416 Radiculopathy, lumbar region: Secondary | ICD-10-CM | POA: Insufficient documentation

## 2024-10-22 DIAGNOSIS — G894 Chronic pain syndrome: Secondary | ICD-10-CM | POA: Insufficient documentation

## 2024-10-22 DIAGNOSIS — M546 Pain in thoracic spine: Secondary | ICD-10-CM | POA: Insufficient documentation

## 2024-10-22 DIAGNOSIS — Z79891 Long term (current) use of opiate analgesic: Secondary | ICD-10-CM | POA: Insufficient documentation

## 2024-10-22 DIAGNOSIS — M542 Cervicalgia: Secondary | ICD-10-CM | POA: Insufficient documentation

## 2024-10-22 DIAGNOSIS — M7918 Myalgia, other site: Secondary | ICD-10-CM | POA: Insufficient documentation

## 2024-10-22 MED ORDER — HYDROCODONE-ACETAMINOPHEN 7.5-325 MG PO TABS: ORAL_TABLET | ORAL | 0 refills | Status: DC

## 2024-10-22 MED ORDER — HYDROCODONE-ACETAMINOPHEN 7.5-325 MG PO TABS
ORAL_TABLET | ORAL | 0 refills | Status: DC
Start: 2024-10-22 — End: 2024-10-22
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
## 2024-10-29 ENCOUNTER — Encounter: Payer: Self-pay | Admitting: Cardiology

## 2024-10-29 ENCOUNTER — Ambulatory Visit: Attending: Cardiology | Admitting: Cardiology

## 2024-10-29 VITALS — BP 100/66 | HR 55 | Ht 62.0 in | Wt 196.0 lb

## 2024-10-29 DIAGNOSIS — Z9889 Other specified postprocedural states: Secondary | ICD-10-CM | POA: Diagnosis not present

## 2024-10-29 DIAGNOSIS — I351 Nonrheumatic aortic (valve) insufficiency: Secondary | ICD-10-CM

## 2024-10-29 DIAGNOSIS — I10 Essential (primary) hypertension: Secondary | ICD-10-CM | POA: Diagnosis not present

## 2024-10-29 DIAGNOSIS — Z8679 Personal history of other diseases of the circulatory system: Secondary | ICD-10-CM

## 2024-10-29 DIAGNOSIS — I5032 Chronic diastolic (congestive) heart failure: Secondary | ICD-10-CM

## 2024-10-29 DIAGNOSIS — E782 Mixed hyperlipidemia: Secondary | ICD-10-CM

## 2024-10-29 NOTE — Patient Instructions (Signed)
 Medication Instructions:  Your physician recommends that you continue on your current medications as directed. Please refer to the Current Medication list given to you today.  *If you need a refill on your cardiac medications before your next appointment, please call your pharmacy*  Lab Work: Today: CMP, LDL Direct, Pro BNP If you have labs (blood work) drawn today and your tests are completely normal, you will receive your results only by: MyChart Message (if you have MyChart) OR A paper copy in the mail If you have any lab test that is abnormal or we need to change your treatment, we will call you to review the results.  Testing/Procedures: None  Follow-Up: At [facility] [place] [facility] you and your health needs are our priority.  As part of our continuing mission to provide you with exceptional heart care, our providers are all part of one team.  This team includes your primary Cardiologist (physician) and Advanced Practice Providers or APPs (Physician Assistants and Nurse Practitioners) who all work together to provide you with the care you need, when you need it.  Your next appointment:   1 year(s)  Provider:   Delon Hoover, NP    Other Instructions Check blood pressure for 1 week and send results to the office for review.

## 2024-10-30 ENCOUNTER — Ambulatory Visit: Payer: Self-pay | Admitting: Cardiology

## 2024-10-30 DIAGNOSIS — R7989 Other specified abnormal findings of blood chemistry: Secondary | ICD-10-CM

## 2024-10-30 LAB — COMPREHENSIVE METABOLIC PANEL WITH GFR
ALT: 17 IU/L (ref 0–32)
AST: 19 IU/L (ref 0–40)
Albumin: 4 g/dL (ref 3.9–4.9)
Alkaline Phosphatase: 84 IU/L (ref 49–135)
BUN/Creatinine Ratio: 15 (ref 12–28)
BUN: 22 mg/dL (ref 8–27)
Bilirubin Total: 0.4 mg/dL (ref 0.0–1.2)
CO2: 24 mmol/L (ref 20–29)
Calcium: 8.9 mg/dL (ref 8.7–10.3)
Chloride: 101 mmol/L (ref 96–106)
Creatinine, Ser: 1.46 mg/dL — ABNORMAL HIGH (ref 0.57–1.00)
Globulin, Total: 3.1 g/dL (ref 1.5–4.5)
Glucose: 90 mg/dL (ref 70–99)
Potassium: 4.1 mmol/L (ref 3.5–5.2)
Sodium: 135 mmol/L (ref 134–144)
Total Protein: 7.1 g/dL (ref 6.0–8.5)
eGFR: 40 mL/min/1.73 — ABNORMAL LOW

## 2024-10-30 LAB — LDL CHOLESTEROL, DIRECT: LDL Direct: 83 mg/dL (ref 0–99)

## 2024-10-30 LAB — PRO B NATRIURETIC PEPTIDE: NT-Pro BNP: 180 pg/mL (ref 0–287)

## 2024-11-10 ENCOUNTER — Other Ambulatory Visit: Payer: Self-pay | Admitting: Cardiology

## 2024-11-21 ENCOUNTER — Other Ambulatory Visit: Payer: Self-pay | Admitting: Registered Nurse

## 2024-12-03 ENCOUNTER — Ambulatory Visit: Admitting: Registered Nurse

## 2024-12-15 ENCOUNTER — Encounter: Admitting: Registered Nurse

## 2024-12-22 ENCOUNTER — Telehealth: Payer: Self-pay | Admitting: Registered Nurse

## 2024-12-22 ENCOUNTER — Other Ambulatory Visit: Payer: Self-pay | Admitting: Registered Nurse

## 2024-12-22 ENCOUNTER — Encounter: Admitting: Registered Nurse

## 2024-12-22 DIAGNOSIS — G894 Chronic pain syndrome: Secondary | ICD-10-CM

## 2024-12-22 DIAGNOSIS — M542 Cervicalgia: Secondary | ICD-10-CM

## 2024-12-22 DIAGNOSIS — G8929 Other chronic pain: Secondary | ICD-10-CM
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
## 2024-12-24 ENCOUNTER — Encounter: Payer: Self-pay | Admitting: Registered Nurse

## 2024-12-24 ENCOUNTER — Other Ambulatory Visit: Payer: Self-pay | Admitting: Registered Nurse

## 2024-12-24 ENCOUNTER — Telehealth: Payer: Self-pay | Admitting: Registered Nurse

## 2024-12-24 DIAGNOSIS — M542 Cervicalgia: Secondary | ICD-10-CM

## 2024-12-24 DIAGNOSIS — G8929 Other chronic pain: Secondary | ICD-10-CM

## 2024-12-24 DIAGNOSIS — G894 Chronic pain syndrome: Secondary | ICD-10-CM

## 2024-12-24 NOTE — Telephone Encounter (Signed)
 Appt set for 1/16 at 11am

## 2024-12-24 NOTE — Telephone Encounter (Signed)
 Patient called to cancel her appt today due to being sick. She is requesting a refill of her pain medication.

## 2024-12-24 NOTE — Telephone Encounter (Signed)
 PDMP was Reviewed.  Hydrocodone  e-scribed to pharmacy

## 2025-01-02 ENCOUNTER — Encounter: Payer: Self-pay | Admitting: Registered Nurse

## 2025-01-02 ENCOUNTER — Encounter: Payer: Self-pay | Attending: Registered Nurse | Admitting: Registered Nurse

## 2025-01-02 VITALS — BP 127/77 | HR 60 | Ht 62.0 in | Wt 200.6 lb

## 2025-01-02 DIAGNOSIS — M7918 Myalgia, other site: Secondary | ICD-10-CM | POA: Diagnosis not present

## 2025-01-02 DIAGNOSIS — M546 Pain in thoracic spine: Secondary | ICD-10-CM | POA: Diagnosis not present

## 2025-01-02 DIAGNOSIS — G894 Chronic pain syndrome: Secondary | ICD-10-CM | POA: Diagnosis not present

## 2025-01-02 DIAGNOSIS — G8929 Other chronic pain: Secondary | ICD-10-CM | POA: Diagnosis present

## 2025-01-02 DIAGNOSIS — M542 Cervicalgia: Secondary | ICD-10-CM | POA: Diagnosis not present

## 2025-01-02 DIAGNOSIS — Z5181 Encounter for therapeutic drug level monitoring: Secondary | ICD-10-CM | POA: Diagnosis not present

## 2025-01-02 DIAGNOSIS — Z79891 Long term (current) use of opiate analgesic: Secondary | ICD-10-CM | POA: Insufficient documentation

## 2025-01-02 DIAGNOSIS — G4709 Other insomnia: Secondary | ICD-10-CM | POA: Diagnosis not present

## 2025-01-02 DIAGNOSIS — M5416 Radiculopathy, lumbar region: Secondary | ICD-10-CM | POA: Diagnosis not present

## 2025-01-02 DIAGNOSIS — R202 Paresthesia of skin: Secondary | ICD-10-CM | POA: Insufficient documentation

## 2025-01-02 DIAGNOSIS — M5412 Radiculopathy, cervical region: Secondary | ICD-10-CM | POA: Diagnosis not present

## 2025-01-02 MED ORDER — HYDROCODONE-ACETAMINOPHEN 7.5-325 MG PO TABS: 1.0000 | ORAL_TABLET | Freq: Four times a day (QID) | ORAL | 0 refills | Status: AC | PRN
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
## 2025-01-05 ENCOUNTER — Other Ambulatory Visit: Payer: Self-pay | Admitting: Registered Nurse

## 2025-01-08 ENCOUNTER — Telehealth: Payer: Self-pay | Admitting: Family Medicine

## 2025-01-08 NOTE — Telephone Encounter (Signed)
 Patient called about referral for therapy. Informed her of the referral that was placed in Sept to [facility]. Patient called back after speaking with [facility] stating that she was told they are not taking new patients at this time. Asks if the referral can be sent elsewhere. She does have both Medicaid and Medicare now.

## 2025-01-09 ENCOUNTER — Telehealth: Payer: Self-pay | Admitting: Registered Nurse

## 2025-01-09 MED ORDER — LIDOCAINE 5 % EX PTCH: MEDICATED_PATCH | CUTANEOUS | 1 refills | Status: AC

## 2025-01-09 MED ORDER — PREGABALIN 100 MG PO CAPS: 100.0000 mg | ORAL_CAPSULE | Freq: Three times a day (TID) | ORAL | 3 refills | Status: AC

## 2025-01-09 NOTE — Telephone Encounter (Signed)
 PDMP was Reviewed.  Pregabalin  and Lidocaine  patches e-scribed.

## 2025-01-09 NOTE — Telephone Encounter (Signed)
 Requested Prescriptions   Pending Prescriptions Disp Refills   lidocaine  (LIDODERM ) 5 % [Pharmacy Med Name: lidocaine  5 % topical patch] 90 patch 1    Sig: Place 1 PATCH onto THE SKIN EVERY DAY AS NEEDED. REMOVE AND discard PATCH WITHIN 12 hours OR AS DIRECTED by doctor   pregabalin  (LYRICA ) 100 MG capsule [Pharmacy Med Name: pregabalin  100 mg capsule] 90 capsule 3    Sig: TAKE 1 CAPSULE BY MOUTH 3 TIMES DAILY     Date of patient request: 01/09/2025 Last office visit: 01/02/2025 Upcoming visit: 02/13/2025 Date of last refill: 07/28/2024 Last refill amount: #90 1 refill

## 2025-01-12 ENCOUNTER — Encounter

## 2025-02-13 ENCOUNTER — Encounter: Admitting: Registered Nurse

## 2025-02-23 ENCOUNTER — Encounter

## 2025-03-18 ENCOUNTER — Ambulatory Visit (HOSPITAL_COMMUNITY): Payer: Self-pay | Admitting: Student in an Organized Health Care Education/Training Program
